# Patient Record
Sex: Male | Born: 1950
Health system: Southern US, Community
[De-identification: ages and names within clinical notes are randomized; demographics above are authoritative.]

## PROBLEM LIST (undated history)

## (undated) DIAGNOSIS — I4891 Unspecified atrial fibrillation: Secondary | ICD-10-CM

## (undated) DIAGNOSIS — D649 Anemia, unspecified: Secondary | ICD-10-CM

## (undated) DIAGNOSIS — I5020 Unspecified systolic (congestive) heart failure: Secondary | ICD-10-CM

## (undated) DIAGNOSIS — J449 Chronic obstructive pulmonary disease, unspecified: Secondary | ICD-10-CM

## (undated) DIAGNOSIS — I214 Non-ST elevation (NSTEMI) myocardial infarction: Secondary | ICD-10-CM

## (undated) DIAGNOSIS — I2510 Atherosclerotic heart disease of native coronary artery without angina pectoris: Secondary | ICD-10-CM

## (undated) DIAGNOSIS — I255 Ischemic cardiomyopathy: Secondary | ICD-10-CM

## (undated) DIAGNOSIS — G4733 Obstructive sleep apnea (adult) (pediatric): Secondary | ICD-10-CM

## (undated) DIAGNOSIS — K219 Gastro-esophageal reflux disease without esophagitis: Secondary | ICD-10-CM

## (undated) HISTORY — DX: Unspecified systolic (congestive) heart failure: I50.20

## (undated) HISTORY — DX: Obstructive sleep apnea (adult) (pediatric): G47.33

---

## 1999-03-19 HISTORY — PX: CORONARY ARTERY BYPASS GRAFT: SHX141

## 1999-10-29 ENCOUNTER — Inpatient Hospital Stay (HOSPITAL_COMMUNITY): Admission: EM | Admit: 1999-10-29 | Discharge: 1999-11-05 | Payer: Self-pay | Admitting: Cardiothoracic Surgery

## 1999-10-30 ENCOUNTER — Encounter: Payer: Self-pay | Admitting: Cardiothoracic Surgery

## 1999-11-01 ENCOUNTER — Encounter: Payer: Self-pay | Admitting: Cardiothoracic Surgery

## 1999-11-02 ENCOUNTER — Encounter: Payer: Self-pay | Admitting: Cardiothoracic Surgery

## 1999-11-03 ENCOUNTER — Encounter: Payer: Self-pay | Admitting: Cardiothoracic Surgery

## 1999-11-04 ENCOUNTER — Encounter: Payer: Self-pay | Admitting: Cardiothoracic Surgery

## 2003-10-05 ENCOUNTER — Other Ambulatory Visit: Payer: Self-pay

## 2005-01-19 ENCOUNTER — Emergency Department (HOSPITAL_COMMUNITY): Admission: EM | Admit: 2005-01-19 | Discharge: 2005-01-19 | Payer: Self-pay | Admitting: Emergency Medicine

## 2009-03-18 HISTORY — PX: CARDIAC CATHETERIZATION: SHX172

## 2010-02-25 ENCOUNTER — Inpatient Hospital Stay: Payer: Self-pay | Admitting: Internal Medicine

## 2010-07-09 ENCOUNTER — Emergency Department: Payer: Self-pay | Admitting: Emergency Medicine

## 2010-07-31 ENCOUNTER — Emergency Department: Payer: Self-pay | Admitting: Emergency Medicine

## 2010-08-01 ENCOUNTER — Emergency Department: Payer: Self-pay | Admitting: Emergency Medicine

## 2011-12-17 HISTORY — PX: CARDIAC CATHETERIZATION: SHX172

## 2012-01-10 ENCOUNTER — Encounter (HOSPITAL_COMMUNITY)
Admission: EM | Disposition: A | Discharge status: Home / Self Care | Payer: Self-pay | Source: Home / Self Care | Attending: Internal Medicine

## 2012-01-10 ENCOUNTER — Emergency Department (HOSPITAL_COMMUNITY): Payer: PRIVATE HEALTH INSURANCE

## 2012-01-10 ENCOUNTER — Inpatient Hospital Stay (HOSPITAL_COMMUNITY)
Admission: EM | Admit: 2012-01-10 | Discharge: 2012-01-15 | DRG: 280 | Disposition: A | Discharge status: Home / Self Care | Payer: PRIVATE HEALTH INSURANCE | Attending: Internal Medicine | Admitting: Internal Medicine

## 2012-01-10 ENCOUNTER — Encounter (HOSPITAL_COMMUNITY): Payer: Self-pay | Admitting: Internal Medicine

## 2012-01-10 ENCOUNTER — Inpatient Hospital Stay (HOSPITAL_COMMUNITY): Payer: PRIVATE HEALTH INSURANCE

## 2012-01-10 DIAGNOSIS — R7402 Elevation of levels of lactic acid dehydrogenase (LDH): Secondary | ICD-10-CM | POA: Diagnosis present

## 2012-01-10 DIAGNOSIS — E876 Hypokalemia: Secondary | ICD-10-CM | POA: Diagnosis present

## 2012-01-10 DIAGNOSIS — I25119 Atherosclerotic heart disease of native coronary artery with unspecified angina pectoris: Secondary | ICD-10-CM

## 2012-01-10 DIAGNOSIS — I255 Ischemic cardiomyopathy: Secondary | ICD-10-CM

## 2012-01-10 DIAGNOSIS — J449 Chronic obstructive pulmonary disease, unspecified: Secondary | ICD-10-CM

## 2012-01-10 DIAGNOSIS — I2510 Atherosclerotic heart disease of native coronary artery without angina pectoris: Secondary | ICD-10-CM | POA: Diagnosis present

## 2012-01-10 DIAGNOSIS — I4891 Unspecified atrial fibrillation: Secondary | ICD-10-CM

## 2012-01-10 DIAGNOSIS — I200 Unstable angina: Secondary | ICD-10-CM

## 2012-01-10 DIAGNOSIS — F17200 Nicotine dependence, unspecified, uncomplicated: Secondary | ICD-10-CM | POA: Diagnosis present

## 2012-01-10 DIAGNOSIS — R570 Cardiogenic shock: Secondary | ICD-10-CM

## 2012-01-10 DIAGNOSIS — I214 Non-ST elevation (NSTEMI) myocardial infarction: Secondary | ICD-10-CM

## 2012-01-10 DIAGNOSIS — R7401 Elevation of levels of liver transaminase levels: Secondary | ICD-10-CM | POA: Diagnosis present

## 2012-01-10 DIAGNOSIS — Z91199 Patient's noncompliance with other medical treatment and regimen due to unspecified reason: Secondary | ICD-10-CM

## 2012-01-10 DIAGNOSIS — I2589 Other forms of chronic ischemic heart disease: Secondary | ICD-10-CM | POA: Diagnosis present

## 2012-01-10 DIAGNOSIS — I498 Other specified cardiac arrhythmias: Secondary | ICD-10-CM | POA: Diagnosis not present

## 2012-01-10 DIAGNOSIS — J4489 Other specified chronic obstructive pulmonary disease: Secondary | ICD-10-CM | POA: Diagnosis present

## 2012-01-10 DIAGNOSIS — I4510 Unspecified right bundle-branch block: Secondary | ICD-10-CM | POA: Diagnosis present

## 2012-01-10 DIAGNOSIS — R630 Anorexia: Secondary | ICD-10-CM | POA: Diagnosis not present

## 2012-01-10 DIAGNOSIS — Z7982 Long term (current) use of aspirin: Secondary | ICD-10-CM

## 2012-01-10 DIAGNOSIS — Z9119 Patient's noncompliance with other medical treatment and regimen: Secondary | ICD-10-CM

## 2012-01-10 DIAGNOSIS — I249 Acute ischemic heart disease, unspecified: Secondary | ICD-10-CM

## 2012-01-10 DIAGNOSIS — K219 Gastro-esophageal reflux disease without esophagitis: Secondary | ICD-10-CM | POA: Diagnosis present

## 2012-01-10 DIAGNOSIS — I4901 Ventricular fibrillation: Principal | ICD-10-CM

## 2012-01-10 DIAGNOSIS — R739 Hyperglycemia, unspecified: Secondary | ICD-10-CM

## 2012-01-10 DIAGNOSIS — J9600 Acute respiratory failure, unspecified whether with hypoxia or hypercapnia: Secondary | ICD-10-CM

## 2012-01-10 DIAGNOSIS — I25810 Atherosclerosis of coronary artery bypass graft(s) without angina pectoris: Secondary | ICD-10-CM | POA: Diagnosis present

## 2012-01-10 DIAGNOSIS — R112 Nausea with vomiting, unspecified: Secondary | ICD-10-CM | POA: Diagnosis not present

## 2012-01-10 DIAGNOSIS — I469 Cardiac arrest, cause unspecified: Secondary | ICD-10-CM

## 2012-01-10 DIAGNOSIS — G9340 Encephalopathy, unspecified: Secondary | ICD-10-CM | POA: Diagnosis not present

## 2012-01-10 DIAGNOSIS — Z720 Tobacco use: Secondary | ICD-10-CM

## 2012-01-10 HISTORY — DX: Ischemic cardiomyopathy: I25.5

## 2012-01-10 HISTORY — DX: Atherosclerotic heart disease of native coronary artery without angina pectoris: I25.10

## 2012-01-10 HISTORY — DX: Non-ST elevation (NSTEMI) myocardial infarction: I21.4

## 2012-01-10 HISTORY — DX: Gastro-esophageal reflux disease without esophagitis: K21.9

## 2012-01-10 HISTORY — DX: Chronic obstructive pulmonary disease, unspecified: J44.9

## 2012-01-10 HISTORY — DX: Unspecified atrial fibrillation: I48.91

## 2012-01-10 HISTORY — DX: Anemia, unspecified: D64.9

## 2012-01-10 HISTORY — PX: LEFT HEART CATHETERIZATION WITH CORONARY ANGIOGRAM: SHX5451

## 2012-01-10 LAB — COMPREHENSIVE METABOLIC PANEL
Alkaline Phosphatase: 93 U/L (ref 39–117)
BUN: 12 mg/dL (ref 6–23)
CO2: 11 mEq/L — ABNORMAL LOW (ref 19–32)
Chloride: 102 mEq/L (ref 96–112)
GFR calc Af Amer: 78 mL/min — ABNORMAL LOW (ref >90)
Glucose, Bld: 187 mg/dL — ABNORMAL HIGH (ref 70–99)
Potassium: 2.8 mEq/L — ABNORMAL LOW (ref 3.5–5.1)
Total Bilirubin: 0.5 mg/dL (ref 0.3–1.2)

## 2012-01-10 LAB — POCT I-STAT 3, ART BLOOD GAS (G3+)
Acid-base deficit: 20 mmol/L — ABNORMAL HIGH (ref 0.0–2.0)
Acid-base deficit: 1 mmol/L (ref 0.0–2.0)
Acid-base deficit: 1 mmol/L (ref 0.0–2.0)
Bicarbonate: 10.3 mEq/L — ABNORMAL LOW (ref 20.0–24.0)
Bicarbonate: 18.4 mEq/L — ABNORMAL LOW (ref 20.0–24.0)
Bicarbonate: 23.5 mEq/L (ref 20.0–24.0)
O2 Saturation: 76 %
O2 Saturation: 100 %
O2 Saturation: 100 %
O2 Saturation: 100 %
Patient temperature: 98.6
TCO2: 20 mmol/L (ref 0–100)
TCO2: 26 mmol/L (ref 0–100)
TCO2: 25 mmol/L (ref 0–100)
pCO2 arterial: 42.3 mmHg (ref 35.0–45.0)
pCO2 arterial: 44.4 mmHg (ref 35.0–45.0)
pCO2 arterial: 37.4 mmHg (ref 35.0–45.0)
pH, Arterial: 7.246 — ABNORMAL LOW (ref 7.350–7.450)
pO2, Arterial: 216 mmHg — ABNORMAL HIGH (ref 80.0–100.0)
pO2, Arterial: 199 mmHg — ABNORMAL HIGH (ref 80.0–100.0)

## 2012-01-10 LAB — BASIC METABOLIC PANEL
BUN: 12 mg/dL (ref 6–23)
CO2: 24 mEq/L (ref 19–32)
Calcium: 7.5 mg/dL — ABNORMAL LOW (ref 8.4–10.5)
Creatinine, Ser: 1.04 mg/dL (ref 0.50–1.35)
Glucose, Bld: 329 mg/dL — ABNORMAL HIGH (ref 70–99)

## 2012-01-10 LAB — POCT I-STAT 3, VENOUS BLOOD GAS (G3P V)
Acid-base deficit: 2 mmol/L (ref 0.0–2.0)
O2 Saturation: 69 %
TCO2: 26 mmol/L (ref 0–100)

## 2012-01-10 LAB — CBC
HCT: 46.9 % (ref 39.0–52.0)
Hemoglobin: 15.8 g/dL (ref 13.0–17.0)
RBC: 4.89 MIL/uL (ref 4.22–5.81)
WBC: 12.2 10*3/uL — ABNORMAL HIGH (ref 4.0–10.5)

## 2012-01-10 LAB — POCT I-STAT, CHEM 8
Calcium, Ion: 1.13 mmol/L (ref 1.13–1.30)
Creatinine, Ser: 1 mg/dL (ref 0.50–1.35)
Glucose, Bld: 216 mg/dL — ABNORMAL HIGH (ref 70–99)
HCT: 48 % (ref 39.0–52.0)
Hemoglobin: 16.3 g/dL (ref 13.0–17.0)

## 2012-01-10 LAB — PROTIME-INR: Prothrombin Time: 15.9 seconds — ABNORMAL HIGH (ref 11.6–15.2)

## 2012-01-10 LAB — POCT I-STAT TROPONIN I

## 2012-01-10 LAB — APTT: aPTT: 31 seconds (ref 24–37)

## 2012-01-10 SURGERY — LEFT HEART CATHETERIZATION WITH CORONARY ANGIOGRAM
Anesthesia: LOCAL

## 2012-01-10 MED ORDER — SODIUM CHLORIDE 0.9 % IV SOLN
25.0000 ug/h | INTRAVENOUS | Status: DC
  Filled 2012-01-10: qty 50

## 2012-01-10 MED ORDER — SODIUM CHLORIDE 0.9 % IV SOLN: INTRAVENOUS | Status: DC

## 2012-01-10 MED ORDER — FAMOTIDINE IN NACL 20-0.9 MG/50ML-% IV SOLN
20.0000 mg | Freq: Two times a day (BID) | INTRAVENOUS | Status: DC
  Administered 2012-01-10 – 2012-01-11 (×3): 20 mg via INTRAVENOUS
  Filled 2012-01-10 (×5): qty 50

## 2012-01-10 MED ORDER — MIDAZOLAM HCL 2 MG/2ML IJ SOLN
INTRAMUSCULAR | Status: AC
  Filled 2012-01-10: qty 2

## 2012-01-10 MED ORDER — SUCCINYLCHOLINE CHLORIDE 20 MG/ML IJ SOLN
INTRAMUSCULAR | Status: AC
  Filled 2012-01-10: qty 1

## 2012-01-10 MED ORDER — ROCURONIUM BROMIDE 50 MG/5ML IV SOLN
INTRAVENOUS | Status: AC
  Filled 2012-01-10: qty 2

## 2012-01-10 MED ORDER — ETOMIDATE 2 MG/ML IV SOLN
INTRAVENOUS | Status: AC | PRN
  Administered 2012-01-10: 20 mg via INTRAVENOUS

## 2012-01-10 MED ORDER — SODIUM BICARBONATE 8.4 % IV SOLN
INTRAVENOUS | Status: AC
  Filled 2012-01-10: qty 100

## 2012-01-10 MED ORDER — ASPIRIN 81 MG PO CHEW
324.0000 mg | CHEWABLE_TABLET | ORAL | Status: AC
  Administered 2012-01-10: 324 mg via ORAL

## 2012-01-10 MED ORDER — EPINEPHRINE HCL 0.1 MG/ML IJ SOLN
INTRAMUSCULAR | Status: AC | PRN
  Administered 2012-01-10: 1 via INTRAVENOUS

## 2012-01-10 MED ORDER — POTASSIUM CHLORIDE 10 MEQ/100ML IV SOLN
INTRAVENOUS | Status: AC
  Filled 2012-01-10: qty 100

## 2012-01-10 MED ORDER — LIDOCAINE HCL (PF) 1 % IJ SOLN
INTRAMUSCULAR | Status: AC
  Filled 2012-01-10: qty 30

## 2012-01-10 MED ORDER — HEPARIN BOLUS VIA INFUSION
4000.0000 [IU] | Freq: Once | INTRAVENOUS | Status: AC
  Administered 2012-01-10: 4000 [IU] via INTRAVENOUS
  Filled 2012-01-10: qty 4000

## 2012-01-10 MED ORDER — FENTANYL CITRATE 0.05 MG/ML IJ SOLN
INTRAMUSCULAR | Status: AC
  Filled 2012-01-10: qty 2

## 2012-01-10 MED ORDER — SODIUM CHLORIDE 0.9 % IV SOLN: 250.0000 mL | INTRAVENOUS | Status: DC | PRN

## 2012-01-10 MED ORDER — ASPIRIN 81 MG PO CHEW
324.0000 mg | CHEWABLE_TABLET | Freq: Once | ORAL | Status: DC
  Filled 2012-01-10: qty 4

## 2012-01-10 MED ORDER — ETOMIDATE 2 MG/ML IV SOLN
INTRAVENOUS | Status: AC
  Filled 2012-01-10: qty 20

## 2012-01-10 MED ORDER — LORAZEPAM 2 MG/ML IJ SOLN
INTRAMUSCULAR | Status: AC
  Administered 2012-01-10: 1 mg
  Filled 2012-01-10: qty 1

## 2012-01-10 MED ORDER — IPRATROPIUM BROMIDE HFA 17 MCG/ACT IN AERS: 6.0000 | INHALATION_SPRAY | RESPIRATORY_TRACT | Status: DC | PRN

## 2012-01-10 MED ORDER — HEPARIN (PORCINE) IN NACL 100-0.45 UNIT/ML-% IJ SOLN
850.0000 [IU]/h | INTRAMUSCULAR | Status: DC
  Administered 2012-01-10: 850 [IU]/h via INTRAVENOUS
  Filled 2012-01-10 (×2): qty 250

## 2012-01-10 MED ORDER — MIDAZOLAM HCL 2 MG/2ML IJ SOLN: 2.0000 mg | INTRAMUSCULAR | Status: DC | PRN

## 2012-01-10 MED ORDER — HEPARIN (PORCINE) IN NACL 100-0.45 UNIT/ML-% IJ SOLN
850.0000 [IU]/h | INTRAMUSCULAR | Status: DC
  Administered 2012-01-11: 850 [IU]/h via INTRAVENOUS
  Filled 2012-01-10: qty 250

## 2012-01-10 MED ORDER — FENTANYL BOLUS VIA INFUSION
25.0000 ug | Freq: Four times a day (QID) | INTRAVENOUS | Status: DC | PRN
  Filled 2012-01-10: qty 100

## 2012-01-10 MED ORDER — POTASSIUM CHLORIDE 10 MEQ/100ML IV SOLN
INTRAVENOUS | Status: AC
  Administered 2012-01-10: 10 meq
  Filled 2012-01-10: qty 100

## 2012-01-10 MED ORDER — HEPARIN (PORCINE) IN NACL 2-0.9 UNIT/ML-% IJ SOLN
INTRAMUSCULAR | Status: AC
  Filled 2012-01-10: qty 1500

## 2012-01-10 MED ORDER — ROCURONIUM BROMIDE 50 MG/5ML IV SOLN
INTRAVENOUS | Status: AC | PRN
  Administered 2012-01-10: 70 mg via INTRAVENOUS

## 2012-01-10 MED ORDER — LIDOCAINE HCL (CARDIAC) 20 MG/ML IV SOLN
INTRAVENOUS | Status: AC
Start: 2012-01-10 — End: 2012-01-11
  Filled 2012-01-10: qty 5

## 2012-01-10 MED ORDER — HEPARIN SODIUM (PORCINE) 5000 UNIT/ML IJ SOLN: 60.0000 [IU]/kg | INTRAMUSCULAR | Status: DC

## 2012-01-10 MED ORDER — ASPIRIN 300 MG RE SUPP
300.0000 mg | RECTAL | Status: AC
  Filled 2012-01-10: qty 1

## 2012-01-10 MED ORDER — NOREPINEPHRINE BITARTRATE 1 MG/ML IJ SOLN: 2.0000 ug/min | Freq: Once | INTRAMUSCULAR | Status: DC

## 2012-01-10 MED ORDER — FUROSEMIDE 10 MG/ML IJ SOLN
INTRAMUSCULAR | Status: AC
  Administered 2012-01-11: 40 mg via INTRAVENOUS
  Filled 2012-01-10: qty 8

## 2012-01-10 MED ORDER — NITROGLYCERIN 0.2 MG/ML ON CALL CATH LAB
INTRAVENOUS | Status: AC
  Filled 2012-01-10: qty 1

## 2012-01-10 MED ORDER — DEXTROSE 5 % IV SOLN
300.0000 mg | INTRAVENOUS | Status: AC | PRN
  Administered 2012-01-10: 300 mg via INTRAVENOUS

## 2012-01-10 MED ORDER — SODIUM BICARBONATE 8.4 % IV SOLN
INTRAVENOUS | Status: AC | PRN
  Administered 2012-01-10: 100 meq via INTRAVENOUS

## 2012-01-10 MED ORDER — DEXTROSE 5 % IV SOLN
2.0000 ug/min | INTRAVENOUS | Status: DC
  Filled 2012-01-10: qty 4

## 2012-01-10 MED ORDER — INSULIN ASPART 100 UNIT/ML ~~LOC~~ SOLN
0.0000 [IU] | SUBCUTANEOUS | Status: DC
  Administered 2012-01-10: 7 [IU] via SUBCUTANEOUS
  Administered 2012-01-11 (×3): 1 [IU] via SUBCUTANEOUS

## 2012-01-10 MED ORDER — MIDAZOLAM BOLUS VIA INFUSION
1.0000 mg | INTRAVENOUS | Status: DC | PRN
  Filled 2012-01-10: qty 2

## 2012-01-10 MED ORDER — SODIUM CHLORIDE 0.9 % IV SOLN
1.0000 mg/h | INTRAVENOUS | Status: DC
  Administered 2012-01-10: 2 mg/h via INTRAVENOUS
  Administered 2012-01-11: 4 mg/h via INTRAVENOUS
  Filled 2012-01-10 (×3): qty 10

## 2012-01-10 NOTE — ED Notes (Signed)
Wife and daughter at bedside. Informed of plan of care. Prepare pt to go to cath lab.

## 2012-01-10 NOTE — ED Provider Notes (Signed)
History     CSN: [id number]  Arrival date & time 01/10/12  1713   First MD Initiated Contact with Patient 01/10/12 1715      Chief Complaint  Patient presents with  . Chest Pain    (Consider location/radiation/quality/duration/timing/severity/associated sxs/prior treatment) HPI Chest pain that feels similar to previous MIs. Brought here by EMS. Given ASA, nitrox4, IV morphine.  Pain described as heaviness, 3/10 in intensity. The location of the patient's problem is central chest with radiation to left arm.  Onset was at 2 PM with slightly improved course since that time.   Modifying factors:  Worse with exertion.  Associated symptoms: diaphoresis, nausea but no vomiting.   Past Medical History  Diagnosis Date  . CAD (coronary artery disease)     s/p CABG 2001  . COPD (chronic obstructive pulmonary disease)     ongoing tobacco use  . GERD (gastroesophageal reflux disease)   . Anemia   . Noncompliance with medication regimen     No past surgical history on file. CABG 2001 No family history on file.  History  Substance Use Topics  . Smoking status: Current Every Day Smoker -- 1.0 packs/day for 45 years    Types: Cigarettes  . Smokeless tobacco: Not on file  . Alcohol Use: Not on file      Review of Systems Negative for respiratory distress, cough. Negative for vomiting, diarrhea.  All other systems reviewed and negative unless noted in HPI.    Allergies  Codeine  Home Medications   Current Outpatient Rx  Name Route Sig Dispense Refill  . ASPIRIN 325 MG PO TABS Oral Take 325 mg by mouth daily.      BP 86/61  Pulse 83  Resp 15  Ht 6' (1.829 m)  SpO2 100%  Physical Exam Nursing note and vitals reviewed.  Constitutional: Pt is alert and appears stated age. Oropharynx: Airway open without erythema or exudate. Respiratory: No respiratory distress. Equal breathing bilaterally. CV: Extremities warm and well perfused. Neuro: No motor nor sensory  deficit. Head: Normocephalic and atraumatic. Eyes: No conjunctivitis, no scleral icterus. Neck: Supple, no mass. Chest: Non-tender. Abdomen: Soft, non-tender MSK: Extremities are atraumatic without deformity. Skin: No rash, no wounds.  ED Course  INTUBATION Date/Time: 01/10/2012 6:16 PM Performed by: Charm Barges Authorized by: Charm Barges Consent: Verbal consent obtained. The procedure was performed in an emergent situation. Consent given by: spouse Required items: required blood products, implants, devices, and special equipment available Patient identity confirmed: arm band and provided demographic data Time out: Immediately prior to procedure a "time out" was called to verify the correct patient, procedure, equipment, support staff and site/side marked as required. Indications: airway protection Intubation method: video-assisted Patient status: paralyzed (RSI) Preoxygenation: nonrebreather mask Pretreatment medications: lidocaine Sedatives: etomidate Paralytic: rocuronium Laryngoscope size: Mac 3 Tube size: 7.5 mm Tube type: cuffed Number of attempts: 2 Ventilation between attempts: BVM Cricoid pressure: yes Cords visualized: yes Post-procedure assessment: chest rise,  ETCO2 monitor and CO2 detector Breath sounds: equal Cuff inflated: yes ETT to lip: 22 cm Tube secured with: ETT holder Chest x-ray interpreted by me. Chest x-ray findings: endotracheal tube too high Tube repositioned: tube repositioned successfully Patient tolerance: Patient tolerated the procedure well with no immediate complications.   (including critical care time)  Labs Reviewed  CBC - Abnormal; Notable for the following:    WBC 12.2 (*)     All other components within normal limits  COMPREHENSIVE METABOLIC PANEL - Abnormal; Notable for the  following:    Potassium 2.8 (*)     CO2 11 (*)     Glucose, Bld 187 (*)     AST 121 (*)     ALT 138 (*)     GFR calc non Af Amer 67 (*)     GFR  calc Af Amer 78 (*)     All other components within normal limits  PROTIME-INR - Abnormal; Notable for the following:    Prothrombin Time 15.9 (*)     All other components within normal limits  POCT I-STAT, CHEM 8 - Abnormal; Notable for the following:    Potassium 2.9 (*)     Glucose, Bld 216 (*)     All other components within normal limits  POCT I-STAT 3, BLOOD GAS (G3+) - Abnormal; Notable for the following:    pH, Arterial 7.019 (*)     pO2, Arterial 59.0 (*)     Bicarbonate 10.3 (*)     Acid-base deficit 20.0 (*)     All other components within normal limits  POCT I-STAT TROPONIN I - Abnormal; Notable for the following:    Troponin i, poc 0.16 (*)     All other components within normal limits  D-DIMER, QUANTITATIVE - Abnormal; Notable for the following:    D-Dimer, Quant 3.82 (*)     All other components within normal limits  POCT I-STAT 3, BLOOD GAS (G3+) - Abnormal; Notable for the following:    pH, Arterial 7.246 (*)     pO2, Arterial 328.0 (*)     Bicarbonate 18.4 (*)     Acid-base deficit 9.0 (*)     All other components within normal limits  POCT I-STAT 3, BLOOD GAS (G3+) - Abnormal; Notable for the following:    pO2, Arterial 216.0 (*)     Bicarbonate 24.7 (*)     All other components within normal limits  POCT I-STAT 3, BLOOD GAS (G3P V) - Abnormal; Notable for the following:    pH, Ven 7.314 (*)     Bicarbonate 24.6 (*)     All other components within normal limits  APTT  POCT ACTIVATED CLOTTING TIME  BASIC METABOLIC PANEL  MAGNESIUM  PHOSPHORUS  TROPONIN I  TROPONIN I  TROPONIN I  PRO B NATRIURETIC PEPTIDE  PROTIME-INR  BASIC METABOLIC PANEL  LACTIC ACID, PLASMA  BLOOD GAS, ARTERIAL  BLOOD GAS, ARTERIAL  HEPATITIS PANEL, ACUTE  HEPATIC FUNCTION PANEL  MRSA PCR SCREENING  BLOOD GAS, ARTERIAL   Dg Chest Port 1 View  01/10/2012  *RADIOLOGY REPORT*  Clinical Data: ET tube, infiltrates  PORTABLE CHEST - 1 VIEW  Comparison: 01/10/2012  Findings:  Endotracheal tube terminates 3.5 cm above the carina.  Increased interstitial markings, favored to reflect mild interstitial edema.  No pleural effusion or pneumothorax.  The heart is normal in size. Postsurgical changes related to prior CABG.  Enteric tube looped within the gastric cardia.  Catheter with its tip in the right main pulmonary artery, likely a Swan-Ganz catheter, although via femoral approach.  IMPRESSION: Endotracheal tube terminates 3.5 cm above the carina.  Suspected mild interstitial edema.   Original Report Authenticated By: Charline Bills, M.D.    Dg Chest Port 1 View  01/10/2012  *RADIOLOGY REPORT*  Clinical Data: Possible infarct myocardial  PORTABLE CHEST - 1 VIEW  Comparison: 01/10/2012  Findings: Endotracheal tube has been placed with tip 8.5 cm above the carina.  Vascular pattern normal.  Lungs clear.  IMPRESSION: No acute cardiopulmonary abnormalities.  Original Report Authenticated By: Otilio Carpen, M.D.    Dg Chest Port 1 View  01/10/2012  *RADIOLOGY REPORT*  Clinical Data: Chest pain and respiratory arrest  PORTABLE CHEST - 1 VIEW  Comparison: None  Findings: Previous median sternotomy and CABG procedure. Heart size is normal.  No pleural effusion or edema.  No airspace consolidation.  IMPRESSION:  1.  No acute cardiopulmonary abnormalities.   Original Report Authenticated By: Rosealee Albee, M.D.      1. Ventricular fibrillation   2. Cardiac arrest   3. ACS (acute coronary syndrome)   4. Acute respiratory failure   5. CAD (coronary artery disease)   6. Cardiogenic shock   7. COPD (chronic obstructive pulmonary disease)   8. NSTEMI (non-ST elevated myocardial infarction)   9. Tobacco abuse       MDM  61 y.o. male here with chest pain.  Story concerning for ACS. Immediately after my interview called back to room. Pt unresponsive. Initiated CPR. Called Dr. Lynelle Doctor to bedside. V fib arrest. ROSC after defibrillation, epi. Please see his note for critical  care note. Wife and family member at bedside updated. Counseling provided regarding diagnosis, treatment plan. Questions answered.  Discussed with cardiology pt with closed eyes, incomprehensible speech, will follow commands. Intubated as above. Cards and critical care at bedside.   Medications/interventions:  Oxygen, chest compressions, defibrillation, IV epinephrine, IV fluid, IV ativan. IV etomidate, IV roc. IV   Data reviewed: EKG ordered and interpreted by me: normal rate, sinus rhyth.  Lab tests ordered and reviewed by me: iSTAT abd with pH of 7.02. I independently viewed the following imaging studies and reviewed radiology's interpretation as summarized: CXR with no mediastinal widening.  Course of care: After intubation, pt low blood pressures. MAP around 60. Cards with recs for levophed, dopamine. Heparin ordered. Pt ready to move to cath lab.   Medical Decision Making discussed with ED attending Celene Kras, MD          Charm Barges, MD 01/10/12 2200

## 2012-01-10 NOTE — CV Procedure (Signed)
Urgent Cardiac Catherization  Franklin Marks, 61 y.o., male  Full note dictated; see diagram in chart  DICTATION # [phone], [id number]  Ao:145/94 LV: 145/23/32  LM: nl LAD: occluded proximally Lcx: 30% proximal RCA: occluded ostially post conus  LIMA to LAD: patent, 80% stenosis in LAD after anastomosis SVG to Dx2: old occlusion with thrombus (was apparently present in 02/2010 cath at [facility]) SVG to DX: patent SVG to RCA: patent but with diffuse distal native RCA beyond insertion with 80-90% post PDA and in PLA  Swan: RA mean 15, a 19            RV: 56/14            PA: 56/34            PC: 33   [name], MD, [facility] 01/10/2012 8:18 PM

## 2012-01-10 NOTE — H&P (Signed)
Name: Mohammedali Bedoy MRN: [id number] DOB: [birth year]    LOS: 0  Referring Provider:  Dr Gala Romney Reason for Referral:  VF arrest, VDRF  PULMONARY / CRITICAL CARE MEDICINE  HPI:  Mr. Calabretta is a 61 y/o male with a h/o COPD with ongoing tobacco use (not on BD's), anemia, GERD and CAD s/p CABG '01. Being admitted with unstable angina and subsequent VF arrest in the [facility] ED. ECG without ST elevation, does have R BBB. Underwent CPR x 1 minute, received epi x 1, amiodarone, heparin gtt. Intubated in the ED with some transient shock that resolved with resolution of sedation. Now in the cath lab for urgent coronary angiography. PCCM to admit.    Past Medical History  Diagnosis Date  . CAD (coronary artery disease)     s/p CABG 2001  . COPD (chronic obstructive pulmonary disease)     ongoing tobacco use  . GERD (gastroesophageal reflux disease)   . Anemia   . Noncompliance with medication regimen    No past surgical history on file. Prior to Admission medications   Medication Sig Start Date End Date Taking? Authorizing Provider  aspirin 325 MG tablet Take 325 mg by mouth daily.   Yes Historical Provider, MD   Allergies Allergies  Allergen Reactions  . Codeine Nausea And Vomiting    Family History No family history on file. Social History  reports that he has been smoking Cigarettes.  He has a 45 pack-year smoking history. He does not have any smokeless tobacco history on file. His alcohol and drug histories not on file.  Review Of Systems:  Unable to obtain due to MS  Brief patient description:  61 yo man with COPD, CAD/CABG, s/p VF arrest and successful resuscitation.   Events Since Admission: Cath lab results 10/25 >>   Current Status: Critical  Vital Signs: Pulse Rate:  [83-100] 83  (10/25 1813) Resp:  [12-31] 15  (10/25 1813) BP: (75-86)/(48-61) 86/61 mmHg (10/25 1812) SpO2:  [98 %-100 %] 100 % (10/25 1813) FiO2 (%):  [100 %] 100 % (10/25 1800) No intake or output data  in the 24 hours ending 01/10/12 1858  Physical Examination: General:  Sedated and intubated, clammy and cool Neuro:  Unresponsive, breathes over set MV rate HEENT:  ETT in place, pupils 3mm, non-reactive Neck:  No LAD Cardiovascular:  Regular, no M, no edema Lungs:  Clear B, no wheeze Abdomen:  Soft, NT, no BS heard Musculoskeletal:  No deformitied Skin:  Cool extremities, no rashes  Principal Problem:  *Ventricular fibrillation Active Problems:  NSTEMI (non-ST elevated myocardial infarction)  Cardiac arrest  Cardiogenic shock  CAD (coronary artery disease)  Acute respiratory failure  COPD (chronic obstructive pulmonary disease)  Tobacco abuse   ASSESSMENT AND PLAN  PULMONARY  Lab 01/10/12 1757  PHART 7.019*  PCO2ART 40.0  PO2ART 59.0*  HCO3 10.3*  O2SAT 76.0   Ventilator Settings: Vent Mode:  [-] PRVC FiO2 (%):  [100 %] 100 % Set Rate:  [16 bmp] 16 bmp Vt Set:  [620 mL] 620 mL PEEP:  [5 cmH20] 5 cmH20 Plateau Pressure:  [13 cmH20] 13 cmH20 CXR:  10/25 >> ETT high, no infiltrates ETT:  10/25 >>   A:  VDRF s/p arrest P:   - No current evidence pulmonary injury or limitation. Pt reportedly had good MS post return of spontaneous circulation, goal initiate SBT and assess for extubation once hemodynamically stablized - ABG on arrival to ICU, adjust MV  - CXR in ICU  to r/o evolving pulm edema   CARDIOVASCULAR No results found for this basename: TROPONINI:5,LATICACIDVEN:5, O2SATVEN:5,PROBNP:5 in the last 168 hours ECG:  A Fib with R BBB Lines:   A: VF arrest. Suspect this is due to unstable angina or a primary cardiac cause. With RBBB could consider other causes arrest such as PE P:  - heparin gtt started for possible ACS, A fib, also as empiric rx for possible PE - amiodarone, ASA, b-blockade and other cardiac regimen as per Dr Bensimhon's plans - norepi ordered in the ED but has not been required - TTE  - if no evidence culprit cardiac lesion, then we may  need to consider further workup for PE. Will defer for now   RENAL  Lab 01/10/12 1756 01/10/12 1734  NA 142 141  K 2.9* 2.8*  CL 110 102  CO2 -- 11*  BUN 12 12  CREATININE 1.00 1.15  CALCIUM -- 8.9  MG -- --  PHOS -- --   Intake/Output    None    Foley:  10/25 >>   A:  hypokalemia P:   - repeat BMP on arrival to 2900 and replete  GASTROINTESTINAL  Lab 01/10/12 1734  AST 121*  ALT 138*  ALKPHOS 93  BILITOT 0.5  PROT 7.3  ALBUMIN 3.7    A:  Transaminitis, etiology unclear P:   - trend LFT - check INR - hepatitis panel  HEMATOLOGIC  Lab 01/10/12 1756 01/10/12 1734  HGB 16.3 15.8  HCT 48.0 46.9  PLT -- 198  INR -- 1.30  APTT -- 31   A:  No issues P:    INFECTIOUS  Lab 01/10/12 1734  WBC 12.2*  PROCALCITON --   Cultures: none Antibiotics: none  A:  No evidence for infection P:     ENDOCRINE No results found for this basename: GLUCAP:5 in the last 168 hours A:  No hx DM P:   - check CBG's, start SSI coverage if > 160's  NEUROLOGIC  A:  Sedated, at risk for encephalopathy s/p VF. MS reportedly was intact immediately post resuscitation, was agitated prior to intubation P:   - decided to defer hypothermia protocol given post-code MS and his short down time.  - fentanyl gtt + intermittent versed  BEST PRACTICE / DISPOSITION Level of Care:  ICU Primary Service:  PCCM Consultants:  [place] cards Code Status:  Full Diet:  NPO DVT Px:  Heparin gtt GI Px:  pepcid Skin Integrity:  Intact  Social / Family:  Discussed with pt's wife at bedside in the ED  CC time 80 minutes  Levy Pupa, MD, PhD 01/10/2012, 7:18 PM [facility] [phone] or if no answer [phone]

## 2012-01-10 NOTE — Progress Notes (Signed)
Agitation  Start Versed drip

## 2012-01-10 NOTE — Progress Notes (Signed)
Chaplain Note:  Chaplain responded immediately to page from [facility] to provide comfort for family of pt.  Pt was in bed being treated by [facility] staff and being prepared for transport to Cath Lab.  Pt was intubated and sedated and did not communicate during this visit.  Chaplain provided spiritual comfort, support, and prayer for pt's wife and family, accompanying them to Cath Lab waiting area and keeping them informed of progress during the procedure.  Chaplain supported pt's physician and family while physician explained the cath procedure results and pt's condition.  Chaplain accompanied family to CICU waiting area and informed CICU staff of family's presense and location.  Pt's family expressed appreciation for chaplain support.  Chaplain will follow up as needed.  01/10/12 1817  Clinical Encounter Type  Visited With Patient;Family  Visit Type Spiritual support  Referral From Nurse  Spiritual Encounters  Spiritual Needs Emotional;Prayer  Stress Factors  Patient Stress Factors Major life changes;Health changes  Family Stress Factors Major life changes   Verdie Shire, [place]  [phone]

## 2012-01-10 NOTE — Consult Note (Signed)
HPI:  [name] is a 61 y/o male with a h/o COPD with ongoing tobacco use, anemia, GERD and CAD. Being admitted with unstable angina and VF arrest.   Had MI in 1999 had PCI of LCX. In 2001 had NSTEMI and underwent CABG by Dr. Donata Marks with LIMA-LAD, SVG-D1, SVG-D2 and SVG-PDA-PL.   Apparently had CP this summer and had cath at [facility] by [facility] clinic doc. Told heart muscle was fine and arteries OK. They couldn't f/u due to insurance.   Has been noncompliant with meds. Smoking 1ppd. This afternoon ~2pm developed CP. Lasted several hours. Called EMS. Got ASA in truck (325mg ). Came to ER and developed VF arrest. CPR x 1 minute. Given epi and shocked. Post shock was combative and got 1mg  Ativan. Now somnolent. Responds to pain. Denies CP. ECG with AF and RBBB. No ST elevation or depression. Got 1 dose IV amio 300 mg and 2 amps bicarb.   ABG 7.01/40/59/76%  ? Venous. Not being intubated. POC Trop 0.16  ROS taken from wife as patient unable to provide.  Review of Systems:     Cardiac Review of Systems: {Y] = yes [ ]  = no  Chest Pain [  y  ]  Resting SOB [   ] Exertional SOB  [ y ]  Orthopnea [  ]   Pedal Edema [   ]    Palpitations [  ] Syncope  [  ]   Presyncope [   ]  General Review of Systems: [Y] = yes [  ]=no Constitional: recent weight change [  ]; anorexia [  ]; fatigue [  ]; nausea [  ]; night sweats [  ]; fever [  ]; or chills [  ];                                                                                                                                          Dental: poor dentition[  ];  Eye : blurred vision [  ]; diplopia [   ]; vision changes [  ];  Amaurosis fugax[  ]; Resp: cough [  ];  wheezing[  ];  hemoptysis[  ]; shortness of breath[  ]; paroxysmal nocturnal dyspnea[  ]; dyspnea on exertion[ y ]; or orthopnea[  ];  GI:  gallstones[  ], vomiting[  ];  dysphagia[  ]; melena[  ];  hematochezia [  ]; heartburn[  ];   Hx of   GU: kidney stones [  ]; hematuria[  ];    dysuria [  ];  nocturia[  ];  history of     obstruction [  ];                 Skin: rash, swelling[  ];, hair loss[  ];  peripheral edema[  ];  or itching[  ]; Musculosketetal: myalgias[  ];  joint swelling[  ];  joint erythema[  ];  joint pain[  ];  back pain[  ];  Heme/Lymph: bruising[  ];  bleeding[  ];  anemia[  ];  Neuro: TIA[  ];  headaches[  ];  stroke[  ];  vertigo[  ];  seizures[  ];   paresthesias[  ];  difficulty walking[  ];  Psych:depression[  ]; anxiety[  ];  Endocrine: diabetes[  ];  thyroid dysfunction[  ];  Other:  Allergies: NKDA  History   Social History  . Marital Status: Married    Spouse Name: N/A    Number of Children: N/A  . Years of Education: N/A   Occupational History  . Not on file.   Social History Main Topics  . Smoking status: Not on file  . Smokeless tobacco: Not on file  . Alcohol Use: Not on file  . Drug Use: Not on file  . Sexually Active: Not on file   Other Topics Concern  . Not on file   Social History Narrative  . No narrative on file   FAMILY HX: unavailable  PHYSICAL EXAM: 110/70 85   General:  Ill appearing. Somnolent. Tachypneic. Responds purposefully to pain.  HEENT: normal Neck: supple. JVP 7. Carotids 2+ bilat; no bruits. No lymphadenopathy or thryomegaly appreciated. Cor: PMI nonpalpable. Distant. Irregular. No obvious murmurs Lungs: +rhonchi. No wheeze Abdomen: soft, nontender, nondistended. No hepatosplenomegaly. No bruits or masses. Good bowel sounds. Extremities: clammy no cyanosis, clubbing, rash, edema Neuro: alert & oriented x 3, cranial nerves grossly intact. moves all 4 extremities w/o difficulty. Affect pleasant.  ECG: AF 80s with RBBB and LPFB. No ST-T wave abnormalities.    Results for orders placed during the hospital encounter of 01/10/12 (from the past 24 hour(s))  POCT I-STAT TROPONIN I     Status: Abnormal   Collection Time   01/10/12  5:55 PM      Component Value Range   Troponin i, poc 0.16 (*)  0.00 - 0.08 ng/mL   Comment NOTIFIED PHYSICIAN     Comment 3           POCT I-STAT, CHEM 8     Status: Abnormal   Collection Time   01/10/12  5:56 PM      Component Value Range   Sodium 142  135 - 145 mEq/L   Potassium 2.9 (*) 3.5 - 5.1 mEq/L   Chloride 110  96 - 112 mEq/L   BUN 12  6 - 23 mg/dL   Creatinine, Ser 1.61  0.50 - 1.35 mg/dL   Glucose, Bld 096 (*) 70 - 99 mg/dL   Calcium, Ion 0.45  4.09 - 1.30 mmol/L   TCO2 10  0 - 100 mmol/L   Hemoglobin 16.3  13.0 - 17.0 g/dL   HCT 81.1  91.4 - 78.2 %  POCT I-STAT 3, BLOOD GAS (G3+)     Status: Abnormal   Collection Time   01/10/12  5:57 PM      Component Value Range   pH, Arterial 7.019 (*) 7.350 - 7.450   pCO2 arterial 40.0  35.0 - 45.0 mmHg   pO2, Arterial 59.0 (*) 80.0 - 100.0 mmHg   Bicarbonate 10.3 (*) 20.0 - 24.0 mEq/L   TCO2 12  0 - 100 mmol/L   O2 Saturation 76.0     Acid-base deficit 20.0 (*) 0.0 - 2.0 mmol/L   Patient temperature 98.6 F     Collection site RADIAL, ALLEN'S TEST ACCEPTABLE  Drawn by :MD     Sample type ARTERIAL     Comment NOTIFIED PHYSICIAN        ASSESSMENT: 1. VF arrest -> cardiogenic shock 2. NSTEMI 3. CAD s/p CABG 2001 4. AFib (? Duration) 5. RBBB (? Duration) 6. COPD with ongoing tobacco use 7. Acute respiratory failure   8. Hypokalemia  PLAN/DISCUSSION:  Once stabilized, he will need to go to cath lab urgently to assess his coronary anatomy. Cath lab activitated. Will start Levophed for BP support as BP 70-80 post intubation. Start heparin. Supp K+. Will need statin. BP too low for b-blocker.   PCCM to admit. As arrest was brief and he followed commands post DC-CV may not need to be cooled. Will d/w PCCM. D/W family.  CCT 1 hour.   Truman Hayward 6:22 PM

## 2012-01-10 NOTE — ED Provider Notes (Addendum)
I saw and evaluated the patient, reviewed the resident's note and I agree with the findings and plan.  The patient was brought in by EMS because of chest pressure that started about 2 hours prior to arrival. Patient has known history of coronary artery disease as well as bypass grafting but has been noncompliant with any followup medical care. While in the emergency room the patient became unresponsive and displayed posturing with tonic activity.  The patient was emergently placed on cardiac monitors and CPR was initiated. The initial rhythm showed ventricular fibrillation. Patient was shocked with 200 J. Spontaneous circulation was restored. When the patient regained consciousness he was very combative and agitated. He was given a milligram of Ativan. His family was brought to the bedside to help reassure him. She is presently denying any pain although he is still complaining of shortness of breath and is still somewhat noncompliant with her care. Laboratory tests have been ordered. He will be monitored closely. I have spoken with Dr. Tenny Craw of cardiology who will be coming to evaluate the patient.  His initial EKG does not show any signs of ST elevation but he does have diffuse ST depression. The symptoms onset concerning for acute coronary syndrome associated with ventricular fibrillation arrest.  CRITICAL CARE Performed by: Linwood Dibbles R Total critical care time: 40 Critical care time was exclusive of separately billable procedures and treating other patients. Critical care was necessary to treat or prevent imminent or life-threatening deterioration. Critical care was time spent personally by me on the following activities: development of treatment plan with patient and/or surrogate as well as nursing, discussions with consultants, evaluation of patient's response to treatment, examination of patient, obtaining history from patient or surrogate, ordering and performing treatments and interventions,  ordering and review of laboratory studies, ordering and review of radiographic studies, pulse oximetry and re-evaluation of patient's condition.   Celene Kras, MD 01/10/12 1749  D/w Dr Teressa Lower.  Will begin cooling after cardiac cath.  Will stat ice packs and sedate.  Celene Kras, MD 01/10/12 [phone]

## 2012-01-10 NOTE — Progress Notes (Signed)
Hyperglycemia   SSI ordered 

## 2012-01-10 NOTE — ED Notes (Addendum)
EMS states chest pressure for 2 hours started at rest. C/o SOB, nausea no vomiting. Given 325 asa, nitro x4, 6mg  morphine given per EMS pain decrease from 5 to 2.

## 2012-01-10 NOTE — Progress Notes (Addendum)
ANTICOAGULATION CONSULT NOTE - Initial Consult  Pharmacy Consult for heparin Indication: chest pain/ACS  Allergies  Allergen Reactions  . Codeine Nausea And Vomiting    Patient Measurements: weight = estimated 70 kg, height= estimated 5'9   Heparin Dosing Weight: 70kg  Vital Signs:    Labs:  Basename 01/10/12 1756  HGB 16.3  HCT 48.0  PLT --  APTT --  LABPROT --  INR --  HEPARINUNFRC --  CREATININE 1.00  CKTOTAL --  CKMB --  TROPONINI --    CrCl is unknown because there is no height on file for the current visit.   Medical History: Past Medical History  Diagnosis Date  . CAD (coronary artery disease)     s/p CABG 2001  . COPD (chronic obstructive pulmonary disease)     ongoing tobacco use  . GERD (gastroesophageal reflux disease)   . Anemia   . Noncompliance with medication regimen     Assessment: Patient is a 61 y.o M admitted to the ED with CP.  On no anticoagulations at home per family member. VFib arrest in ED, CPR x1 min. Now intubated with plan possible for cath today.   Goal of Therapy:  Heparin level 0.3-0.7 units/ml Monitor platelets by anticoagulation protocol: Yes   Plan:  1) heparin 4000 units x1 bolus, then drip at 850 units/hr 2) will get heparin level at midnight tonight if patient doesn't go to cath.  Franklin Marks P 01/10/2012,6:11 PM

## 2012-01-11 ENCOUNTER — Inpatient Hospital Stay (HOSPITAL_COMMUNITY): Payer: PRIVATE HEALTH INSURANCE

## 2012-01-11 ENCOUNTER — Encounter (HOSPITAL_COMMUNITY): Payer: Self-pay | Admitting: General Practice

## 2012-01-11 DIAGNOSIS — I2510 Atherosclerotic heart disease of native coronary artery without angina pectoris: Secondary | ICD-10-CM

## 2012-01-11 LAB — CBC
HCT: 40.5 % (ref 39.0–52.0)
Hemoglobin: 14.3 g/dL (ref 13.0–17.0)
MCHC: 35.3 g/dL (ref 30.0–36.0)
MCV: 90.6 fL (ref 78.0–100.0)
RDW: 12.8 % (ref 11.5–15.5)

## 2012-01-11 LAB — BASIC METABOLIC PANEL
Calcium: 7.9 mg/dL — ABNORMAL LOW (ref 8.4–10.5)
GFR calc Af Amer: 85 mL/min — ABNORMAL LOW (ref >90)
GFR calc non Af Amer: 74 mL/min — ABNORMAL LOW (ref >90)
Sodium: 145 mEq/L (ref 135–145)

## 2012-01-11 LAB — PROTIME-INR
INR: 1.1 (ref 0.00–1.49)
Prothrombin Time: 14.1 seconds (ref 11.6–15.2)

## 2012-01-11 LAB — GLUCOSE, CAPILLARY
Glucose-Capillary: 305 mg/dL — ABNORMAL HIGH (ref 70–99)
Glucose-Capillary: 342 mg/dL — ABNORMAL HIGH (ref 70–99)

## 2012-01-11 LAB — HEPATIC FUNCTION PANEL
ALT: 154 U/L — ABNORMAL HIGH (ref 0–53)
AST: 168 U/L — ABNORMAL HIGH (ref 0–37)
Alkaline Phosphatase: 84 U/L (ref 39–117)
Bilirubin, Direct: 0.1 mg/dL (ref 0.0–0.3)
Indirect Bilirubin: 0.4 mg/dL (ref 0.3–0.9)

## 2012-01-11 LAB — PRO B NATRIURETIC PEPTIDE: Pro B Natriuretic peptide (BNP): 819.3 pg/mL — ABNORMAL HIGH (ref 0–125)

## 2012-01-11 LAB — POCT I-STAT 3, ART BLOOD GAS (G3+)
O2 Saturation: 99 %
Patient temperature: 96.4
TCO2: 26 mmol/L (ref 0–100)
pCO2 arterial: 37.3 mmHg (ref 35.0–45.0)

## 2012-01-11 LAB — MAGNESIUM: Magnesium: 2.2 mg/dL (ref 1.5–2.5)

## 2012-01-11 LAB — HEPARIN LEVEL (UNFRACTIONATED): Heparin Unfractionated: 0.19 IU/mL — ABNORMAL LOW (ref 0.30–0.70)

## 2012-01-11 LAB — LACTIC ACID, PLASMA: Lactic Acid, Venous: 2.5 mmol/L — ABNORMAL HIGH (ref 0.5–2.2)

## 2012-01-11 LAB — TROPONIN I: Troponin I: 13.57 ng/mL (ref <0.30)

## 2012-01-11 MED ORDER — HEPARIN (PORCINE) IN NACL 100-0.45 UNIT/ML-% IJ SOLN
1300.0000 [IU]/h | INTRAMUSCULAR | Status: DC
  Administered 2012-01-12: 1300 [IU]/h via INTRAVENOUS
  Filled 2012-01-11 (×2): qty 250

## 2012-01-11 MED ORDER — OXYCODONE HCL 5 MG PO TABS
5.0000 mg | ORAL_TABLET | Freq: Four times a day (QID) | ORAL | Status: DC | PRN
  Administered 2012-01-12 – 2012-01-14 (×3): 5 mg via ORAL
  Filled 2012-01-11 (×3): qty 1

## 2012-01-11 MED ORDER — FUROSEMIDE 10 MG/ML IJ SOLN
40.0000 mg | Freq: Every day | INTRAMUSCULAR | Status: DC
  Administered 2012-01-11 – 2012-01-12 (×2): 40 mg via INTRAVENOUS
  Filled 2012-01-11 (×2): qty 4

## 2012-01-11 MED ORDER — BIOTENE DRY MOUTH MT LIQD
15.0000 mL | Freq: Four times a day (QID) | OROMUCOSAL | Status: DC
  Administered 2012-01-11 (×2): 15 mL via OROMUCOSAL

## 2012-01-11 MED ORDER — CHLORHEXIDINE GLUCONATE 0.12 % MT SOLN
15.0000 mL | Freq: Two times a day (BID) | OROMUCOSAL | Status: DC
  Administered 2012-01-11: 15 mL via OROMUCOSAL
  Filled 2012-01-11: qty 15

## 2012-01-11 MED ORDER — INFLUENZA VIRUS VACC SPLIT PF IM SUSP
0.5000 mL | INTRAMUSCULAR | Status: AC
  Administered 2012-01-12: 0.5 mL via INTRAMUSCULAR
  Filled 2012-01-11: qty 0.5

## 2012-01-11 MED ORDER — ACETAMINOPHEN 325 MG PO TABS
650.0000 mg | ORAL_TABLET | ORAL | Status: DC | PRN
  Administered 2012-01-11 – 2012-01-14 (×5): 650 mg via ORAL
  Filled 2012-01-11 (×5): qty 2

## 2012-01-11 MED ORDER — PNEUMOCOCCAL VAC POLYVALENT 25 MCG/0.5ML IJ INJ
0.5000 mL | INJECTION | INTRAMUSCULAR | Status: AC
  Administered 2012-01-12: 0.5 mL via INTRAMUSCULAR
  Filled 2012-01-11: qty 0.5

## 2012-01-11 MED ORDER — ONDANSETRON HCL 4 MG PO TABS
4.0000 mg | ORAL_TABLET | Freq: Three times a day (TID) | ORAL | Status: DC | PRN
  Administered 2012-01-11 – 2012-01-13 (×4): 4 mg via ORAL
  Filled 2012-01-11 (×4): qty 1

## 2012-01-11 NOTE — Progress Notes (Signed)
DC'd Swan and introducer in R femoral vein. Heparin held one hour prior to removal and one hour post removal.

## 2012-01-11 NOTE — Progress Notes (Signed)
ANTICOAGULATION CONSULT NOTE - Follow Up Consult  Pharmacy Consult for heparin Indication: chest pain s/p cath  Allergies  Allergen Reactions  . Codeine Nausea And Vomiting    Patient Measurements: Height: 5\' 8"  (172.7 cm) IBW/kg (Calculated) : 68.4  Heparin Dosing Weight: 70 kg  Vital Signs: Temp: 95.4 F (35.2 C) (10/25 2330) BP: 86/65 mmHg (10/25 2330) Pulse Rate: 79  (10/25 2330)  Labs:  Basename 01/10/12 1841 01/10/12 1840 01/10/12 1756 01/10/12 1734  HGB -- -- 16.3 15.8  HCT -- -- 48.0 46.9  PLT -- -- -- 198  APTT -- -- -- 31  LABPROT -- -- -- 15.9*  INR -- -- -- 1.30  HEPARINUNFRC -- -- -- --  CREATININE 1.04 -- 1.00 1.15  CKTOTAL -- -- -- --  CKMB -- -- -- --  TROPONINI -- 5.17* -- --    The CrCl is unknown because both a height and weight (above a minimum accepted value) are required for this calculation.   Medications:  Scheduled:    . aspirin  324 mg Oral Once  . aspirin  324 mg Oral NOW   Or  . aspirin  300 mg Rectal NOW  . etomidate      . famotidine (PEPCID) IV  20 mg Intravenous Q12H  . fentaNYL      . furosemide      . heparin      . heparin  4,000 Units Intravenous Once  . insulin aspart  0-9 Units Subcutaneous Q4H  . lidocaine (cardiac) 100 mg/75ml      . lidocaine      . LORazepam      . midazolam      . midazolam      . midazolam      . nitroGLYCERIN      . potassium chloride      . rocuronium      . sodium bicarbonate      . succinylcholine      . DISCONTD: heparin  60 Units/kg Intravenous STAT  . DISCONTD: norepinephrine (LEVOPHED) Adult infusion  2-50 mcg/min Intravenous Once   Infusions:    . sodium chloride    . amiodarone (NEXTERONE) IV bolus only 150 mg/100 mL Stopped (01/10/12 2017)  . fentaNYL infusion INTRAVENOUS 150 mcg/hr (01/10/12 2330)  . heparin    . midazolam (VERSED) infusion 2 mg/hr (01/10/12 2157)  . norepinephrine (LEVOPHED) Adult infusion    . DISCONTD: heparin 850 Units/hr (01/10/12 1811)     Assessment: 61 yo male with chest pain s/p cath will be restarted on heparin therapy 6hrs after sheath is removed.  Sheath was removed at 1946 per cath report.  Goal of Therapy:  Heparin level 0.3-0.7 units/ml Monitor platelets by anticoagulation protocol: Yes   Plan:  1) Restart heparin at 850 units/hr at 0145 on 01/11/12. 2) Check a 6hr heparin level after drip is restarted. 3) Daily heparin level and CBC  Franklin Marks, Franklin Marks 01/11/2012,12:10 AM

## 2012-01-11 NOTE — Progress Notes (Signed)
Pt extubated to 2L [place] @1053 .  Hr 82, Sat 98%, rr 18, BP 111/77, no stridor, diminished BBS.  RT will continue to monitor.

## 2012-01-11 NOTE — Cardiovascular Report (Signed)
NAMEEVIAN, SALGUERO NO.:  [id number]  MEDICAL RECORD NO.:  [id number]  LOCATION:  2907                         FACILITY:  [facility]  PHYSICIAN:  Nicki Guadalajara, M.D.     DATE OF BIRTH:  1950/06/14  DATE OF PROCEDURE: DATE OF DISCHARGE:                           CARDIAC CATHETERIZATION   PROCEDURE:  Urgent cardiac catheterization with cine coronary angiography; selective angiography into saphenous vein graft; selective angiography into left internal mammary artery; left ventriculography; and Swan-Ganz catheterization.  INDICATIONS:  [name] is a 61 year old gentleman who has a history of COPD with ongoing tobacco use, GERD, and documented coronary artery disease.  He had suffered an MI in 1999.  Apparently had intervention to circumflex vessel.  In 2001, he suffered a non ST-segment MI and underwent CABG revascularization surgery by Dr. Donata Clay and had a RIMA placed to the LAD, and SVG to the first diagonal and SVG to the 2nd diagonal, and SVG to the PDA and PLA vessel.  Apparently in December 2011, he had undergone cardiac catheterization by Dr. Freda Munro in [facility], which apparently revealed the graft, which supplied the diagonal vessel was full of clot and medical therapy was recommended.  Reportedly LV function was fairly normal at that time. The patient has been noncompliant with medications.  This afternoon at approximately 2 p.m., he developed chest pain which lasted for several hours.  He ultimately called EMS.  He presented to [facility] and shortly after arrival developed ventricular fibrillation arrest.  He underwent CPR, was treated with epinephrine and defibrillated.  He was seen by Critical Care and admitted and seen in consultation by Dr. Gala Romney.  ECG showed atrial fibrillation with right bundle-branch block without ST-segment elevation or depression.  He is now taken up to the catheterization laboratory for urgent  catheterization.  PROCEDURE:  Upon arrival to the catheterization laboratory, the patient was intubated and was started on sedation.  Right femoral artery was punctured anteriorly and a 6-French sheath was inserted without difficulty.  Diagnostic catheterization was done utilizing 6-French Judkins 4 left and right coronary catheters.  The right catheter was also used for selective angiography into the 3 saphenous vein grafts as well as into the left internal mammary artery.  A pigtail catheter was then inserted and RAO ventriculography was performed.  At this time, it was felt the patient required Swan-Ganz catheterization.  His right femoral vein was then punctured and 7-French venous sheath was inserted. Swan-Ganz catheter was advanced to the Franklin Marks, RV, PA, and PC positions. Fick cardiac output determination was performed.  The Swan-Ganz catheter was sutured in place.  His arterial sheath was removed in the cath lab. The patient re-transported to the Coronary Care Unit under Critical Care Service and [facility] Service.  HEMODYNAMIC DATA:  Initial central aortic pressure 145/94.  Left ventricle pressure 145/23.  Post A-wave 32.  Swan-Ganz catheterization revealed Franklin Marks pressure mean of 15, A-wave 19, RV pressure 57/14, PA pressure 57/34 with a mean of 44 and mean pulmonary capillary wedge pressure of 33.  ANGIOGRAPHIC DATA:  Left main coronary artery was a long normal vessel, which bifurcated into an LAD and left circumflex system.  The LAD was totally occluded proximally.  The circumflex vessel was then bypass and had 30% narrowing in its proximal segment.  The right coronary artery was totally occluded at its origin.  The vein graft supplying the right coronary artery was widely patent. The native RCA was diffusely diseased.  There was filling antegrade up to the mid RCA where there was 90% stenosis.  Distal to the graft anastomosis after the PDA vessel, there was diffuse  80% stenosis and then in the PLA vessel, there was 80% to 90% stenosis.  The most superior vein graft was filled with clot and this is old and apparently was noted in the report from December 2011 done at [facility]. The 2nd vein graft supplied the diagonal vessel which was widely patent, and had mild 20% narrowing proximally.  LIMA graft was widely patent and anastomosed into the mid LAD.  Distal to the LIMA insertion, there was 80% LAD stenosis.  Antegrade to the main insertion, the LAD was diffusely diseased proximally with diffuse 80% stenosis in the region of the septal proximal septal perforating artery.  RAO ventriculography revealed ejection fraction of approximately 25%. There was severe hypokinesis of the mid inferior wall and akinesis anterolaterally.  IMPRESSION: 1. Ischemic cardiomyopathy with ejection fraction of approximately 25%     with focal akinesis of the anterolateral wall and severe inferior     hypokinesis. 2. Severe native coronary artery disease with total occlusion of the     proximal left anterior descending, 20% to 30% circumflex stenosis     in the bypass circumflex, and total occlusion of the right coronary     artery at its origin. 3. Patent left internal mammary artery graft to the left anterior     descending, but with 80% stenosis in the left anterior descending     beyond the graft insertion. 4. Old occlusion of a clot laden vein graft which had supplied the     second diagonal vessel. 5. Patent vein graft supplying the first diagonal vessel with mild 20%     narrowing. 6. Patent vein graft supplying the right coronary artery with native     distal RCA diffuse disease with 80% to 90% stenosis distally after     the PDA takeoff and 80% to 90% in the PLA vessel.          ______________________________ Nicki Guadalajara, M.D.     TK/MEDQ  D:  01/10/2012  T:  01/11/2012  Job:  [id number]  cc:   Bevelyn Buckles. Bensimhon, MD

## 2012-01-11 NOTE — Progress Notes (Signed)
Name: Orval Gatley MRN: [id number] DOB: Jun 05, 1950    LOS: 1  Referring Provider:  Dr Gala Romney Reason for Referral:  VF arrest, VDRF  PULMONARY / CRITICAL CARE MEDICINE  HPI:  Mr. Gortney is a 61 y/o male with a h/o COPD with ongoing tobacco use (not on BD's), anemia, GERD and CAD s/p CABG '01. Being admitted with unstable angina and subsequent VF arrest in the [facility] ED. ECG without ST elevation, does have R BBB. Underwent CPR x 1 minute, received epi x 1, amiodarone, heparin gtt. Intubated in the ED with some transient shock that resolved with resolution of sedation. Now in the cath lab for urgent coronary angiography. PCCM to admit.   Brief patient description:  61 yo man with COPD, CAD/CABG, s/p VF arrest and successful resuscitation.   Events Since Admission: Cath lab results 10/25 >>  LM: nl ; LAD: occluded proximally ; Lcx: 30% proximal  RCA: occluded ostially post conus  LIMA to LAD: patent, 80% stenosis in LAD after anastomosis  SVG to Dx2: old occlusion with thrombus (was apparently present in 02/2010 cath at [facility])  SVG to DX: patent  SVG to RCA: patent but with diffuse distal native RCA beyond insertion with 80-90% post PDA and in PLA  Swan: RA mean 15, a 19, RV: 56/14, PA: 56/34 , PC: 33   Current Status: Critical  Subjective Awake and tolerating PSV, uncomfortable  Vital Signs: Temp:  [94.3 F (34.6 C)-97.2 F (36.2 C)] 96.8 F (36 C) (10/26 0600) Pulse Rate:  [62-104] 70  (10/26 0600) Resp:  [12-31] 17  (10/26 0600) BP: (75-156)/(48-102) 102/73 mmHg (10/26 0600) SpO2:  [98 %-100 %] 99 % (10/26 0600) FiO2 (%):  [30 %-100 %] 30 % (10/26 0834) Weight:  [73.7 kg (162 lb 7.7 oz)] 73.7 kg (162 lb 7.7 oz) (10/26 0100)  Intake/Output Summary (Last 24 hours) at 01/11/12 1038 Last data filed at 01/11/12 0600  Gross per 24 hour  Intake    180 ml  Output   2500 ml  Net  -2320 ml    Physical Examination: General:  intubated, uncomfortable Neuro:  Awake, moves all  ext HEENT:  ETT in place, pupils 4mm, reactive Neck:  No LAD Cardiovascular:  Regular, no M, no edema Lungs:  Clear B, no wheeze Abdomen:  Soft, NT, no BS heard Musculoskeletal:  No deformitied Skin:  Cool extremities, no rashes  Principal Problem:  *Ventricular fibrillation Active Problems:  NSTEMI (non-ST elevated myocardial infarction)  Cardiac arrest  Cardiogenic shock  CAD (coronary artery disease)  Acute respiratory failure  COPD (chronic obstructive pulmonary disease)  Tobacco abuse   ASSESSMENT AND PLAN  PULMONARY  Lab 01/11/12 0405 01/10/12 2309 01/10/12 1942 01/10/12 1936 01/10/12 1858 01/10/12 1757  PHART 7.430 7.402 7.353 -- 7.246* 7.019*  PCO2ART 37.3 37.4 44.4 -- 42.3 40.0  PO2ART 107.0* 199.0* 216.0* -- 328.0* 59.0*  HCO3 25.0* 23.5 24.7* 24.6* 18.4* --  O2SAT 99.0 100.0 100.0 69.0 100.0 --   Ventilator Settings: Vent Mode:  [-] PRVC FiO2 (%):  [30 %-100 %] 30 % Set Rate:  [16 bmp] 16 bmp Vt Set:  [550 mL-620 mL] 550 mL PEEP:  [5 cmH20] 5 cmH20 Plateau Pressure:  [13 cmH20-16 cmH20] 16 cmH20 CXR:  10/25 >> mild interstitial prominence B  ETT:  10/25 >>   A:  VDRF s/p arrest P:   - assess for extubation 10/26   CARDIOVASCULAR  Lab 01/11/12 0448 01/11/12 0445 01/11/12 0444 01/11/12 0023 01/10/12 1840  TROPONINI 13.57* -- -- 9.01* 5.17*  LATICACIDVEN -- 2.5* -- -- --  PROBNP -- -- 819.3* -- --   ECG:  A Fib with R BBB Lines:  Femoral PA-c 10/25 >>  Femoral art line 10/25 >>   A: VF arrest. Suspect due to NSTEMI. With RBBB could consider other causes arrest such as PE (doubt) New (presumed ischemic) CM; PAOP normalized 10/26 post-acute event P:  - heparin gtt started for possible ACS, A fib, also as empiric rx for possible PE - amiodarone d/c'd 10/26 - ASA - TTE pending - d/c PA-c and cordis, art line 10/26   RENAL  Lab 01/11/12 0448 01/10/12 1841 01/10/12 1756 01/10/12 1734  NA 145 139 142 141  K 3.5 3.8 -- --  CL 107 102 110 102    CO2 27 24 -- 11*  BUN 13 12 12 12   CREATININE 1.07 1.04 1.00 1.15  CALCIUM 7.9* 7.5* -- 8.9  MG 2.2 -- -- --  PHOS 3.1 -- -- --   Intake/Output      10/25 0701 - 10/26 0700 10/26 0701 - 10/27 0700   I.V. (mL/kg) 130 (1.8)    IV Piggyback 50    Total Intake(mL/kg) 180 (2.4)    Urine (mL/kg/hr) 2500 (1.4)    Total Output 2500    Net -2320          Foley:  10/25 >>   A:  hypokalemia P:   - follow BMP  GASTROINTESTINAL  Lab 01/11/12 0448 01/10/12 1734  AST 168* 121*  ALT 154* 138*  ALKPHOS 84 93  BILITOT 0.5 0.5  PROT 6.5 7.3  ALBUMIN 3.5 3.7    A:  Transaminitis without hyperbilirubinema, etiology unclear; INR normal P:   - trend LFT - hepatitis panel >>   HEMATOLOGIC  Lab 01/11/12 0448 01/10/12 1756 01/10/12 1734  HGB 14.3 16.3 15.8  HCT 40.5 48.0 46.9  PLT 184 -- 198  INR 1.10 -- 1.30  APTT -- -- 31   A:  No issues P:    INFECTIOUS  Lab 01/11/12 0448 01/10/12 1734  WBC 14.8* 12.2*  PROCALCITON -- --   Cultures: none Antibiotics: none  A:  No evidence for infection P:     ENDOCRINE  Lab 01/11/12 0719 01/11/12 0413 01/10/12 2325 01/10/12 2226 01/10/12 2120  GLUCAP 108* 134* 283* 342* 305*   A:  No hx DM P:   - SSI per protocol  NEUROLOGIC  A:  Encephalopathy >> Significant improvement 10/26 P:   - follow, minimize sedating meds  BEST PRACTICE / DISPOSITION Level of Care:  ICU Primary Service: [place] cards Consultants:  PCCM Code Status:  Full Diet:  NPO DVT Px:  Heparin gtt GI Px:  pepcid Skin Integrity:  Intact  Social / Family:  Discussed with pt's wife at bedside in the ED  CC time 40 minutes  Levy Pupa, MD, PhD 01/11/2012, 10:38 AM [facility] [phone] or if no answer [phone]

## 2012-01-11 NOTE — Progress Notes (Signed)
Franklin Marks  61 y.o.  male  Subjective: Intubated; awake and reasonably alert following decrease in sedation; uncomfortable with respect to endotracheal tube.  Allergy: Codeine  Objective: Vital signs in last 24 hours: Temp:  [94.3 F (34.6 C)-97.2 F (36.2 C)] 96.8 F (36 C) (10/26 0600) Pulse Rate:  [62-104] 70  (10/26 0600) Resp:  [12-31] 17  (10/26 0600) BP: (75-156)/(48-102) 102/73 mmHg (10/26 0600) SpO2:  [98 %-100 %] 99 % (10/26 0600) FiO2 (%):  [30 %-100 %] 30 % (10/26 0834) Weight:  [73.7 kg (162 lb 7.7 oz)] 73.7 kg (162 lb 7.7 oz) (10/26 0100)  73.7 kg (162 lb 7.7 oz) Body mass index is 24.70 kg/(m^2).  Weight change:    Intake/Output from previous day: 10/25 0701 - 10/26 0700 In: 180 [I.V.:130; IV Piggyback:50] Out: 2500 [Urine:2500] Net I&O since admission: -2.3 L  General- Well developed; no acute distress; proportionate weight and height Neck- No JVD, no carotid bruits Lungs- clear lung fields; normal I:E ratio Cardiovascular- normal PMI; normal S1 and S2 Abdomen- normal bowel sounds; soft and non-tender without masses or organomegaly Skin- Warm, no significant lesions Extremities- Nl distal pulses; no edema  Lab Results: Cardiac Markers:   Basename 01/11/12 0448 01/11/12 0023  TROPONINI 13.57* 9.01*   CBC:   Basename 01/11/12 0448 01/10/12 1756 01/10/12 1734  WBC 14.8* -- 12.2*  HGB 14.3 16.3 --  HCT 40.5 48.0 --  PLT 184 -- 198   BMET:  Basename 01/11/12 0448 01/10/12 1841  NA 145 139  K 3.5 3.8  CL 107 102  CO2 27 24  GLUCOSE 127* 329*  BUN 13 12  CREATININE 1.07 1.04  CALCIUM 7.9* 7.5*   Hepatic Function:   Basename 01/11/12 0448  PROT 6.5  ALBUMIN 3.5  AST 168*  ALT 154*  ALKPHOS 84  BILITOT 0.5  BILIDIR 0.1  IBILI 0.4   EKG:  Pending  Imaging Studies/Results: Portable Chest Xray In Am  01/11/2012  *RADIOLOGY REPORT*  Clinical Data: Shortness of breath, intubated  PORTABLE CHEST - 1 VIEW  Comparison:   the previous day's  study  Findings: Endotracheal tube tip 3.2 cm above carina.  Nasogastric tube loops in the stomach.  A femoral Swan-Ganz catheter extends into a peripheral branch of the right pulmonary artery and could be retracted several centimeters.  Mild diffuse interstitial edema or infiltrates largely stable.  No effusion.  Heart size within normal limits for technique.  IMPRESSION:  1.  Stable interstitial edema. 2.  Support hardware placement as above.   Original Report Authenticated By: Osa Craver, M.D.    Dg Chest Port 1 View  01/10/2012  *RADIOLOGY REPORT*  Clinical Data: ET tube, infiltrates  PORTABLE CHEST - 1 VIEW  Comparison: 01/10/2012  Findings: Endotracheal tube terminates 3.5 cm above the carina.  Increased interstitial markings, favored to reflect mild interstitial edema.  No pleural effusion or pneumothorax.  The heart is normal in size. Postsurgical changes related to prior CABG.  Enteric tube looped within the gastric cardia.  Catheter with its tip in the right main pulmonary artery, likely a Swan-Ganz catheter, although via femoral approach.  IMPRESSION: Endotracheal tube terminates 3.5 cm above the carina.  Suspected mild interstitial edema.   Original Report Authenticated By: Charline Bills, M.D.    Dg Chest Port 1 View  01/10/2012  *RADIOLOGY REPORT*  Clinical Data: Possible infarct myocardial  PORTABLE CHEST - 1 VIEW  Comparison: 01/10/2012  Findings: Endotracheal tube has been placed with  tip 8.5 cm above the carina.  Vascular pattern normal.  Lungs clear.  IMPRESSION: No acute cardiopulmonary abnormalities.   Original Report Authenticated By: Otilio Carpen, M.D.    Dg Chest Port 1 View  01/10/2012  *RADIOLOGY REPORT*  Clinical Data: Chest pain and respiratory arrest  PORTABLE CHEST - 1 VIEW  Comparison: None  Findings: Previous median sternotomy and CABG procedure. Heart size is normal.  No pleural effusion or edema.  No airspace consolidation.  IMPRESSION:  1.  No acute  cardiopulmonary abnormalities.   Original Report Authenticated By: Rosealee Albee, M.D.    Imaging: Imaging results have been reviewed  Medications: I have reviewed the patient's current medications.  Infusions:     . sodium chloride    . amiodarone (NEXTERONE) IV bolus only 150 mg/100 mL Stopped (01/10/12 2017)  . fentaNYL infusion INTRAVENOUS 100 mcg/hr (01/11/12 0056)  . heparin 850 Units/hr (01/11/12 0500)  . midazolam (VERSED) infusion 4 mg/hr (01/11/12 0531)  . norepinephrine (LEVOPHED) Adult infusion    . DISCONTD: heparin 850 Units/hr (01/10/12 1811)    Assessment/Plan:  Ventricular fibrillation: Despite stable anatomy, troponin level of 14 suggest acute myocardial infarction; no EKGs available in EMR to review; new tracing ordered. Amiodarone has been discontinued. EP consultation and implantation of AICD anticipated.   NSTEMI (non-ST elevated myocardial infarction): Severe left ventricular dysfunction at catheterization. PCW is now normal, but infiltrates/edema persists on chest x-ray. BNP level-819. Net I&O is -0.3 L since admission. Gentle diuresis will be continued as well as intravenous heparin.  Introduce beta blockers, Aldactone and ACE inhibitors as blood pressure permits. Clopidigrel post-MI once heparin discontinued.  Will review cath films.  Relative hypotension: Blood pressure is sagging. Dopamine will be added if required.   Acute respiratory failure: Possibly can be extubated; CCM to evaluate.  Chest x-ray interpreted as demonstrating infiltrates-may represent pulmonary edema that is lagging hemodynamic improvement.     LOS: 1 day   [place] Bing 01/11/2012, 10:16 AM

## 2012-01-11 NOTE — Progress Notes (Signed)
Nausea   Zofran given  

## 2012-01-11 NOTE — Progress Notes (Signed)
Pain   Tylenol and Oxycodone ordered

## 2012-01-11 NOTE — Progress Notes (Signed)
ANTICOAGULATION CONSULT NOTE - Follow Up Consult  Pharmacy Consult for Heparin Indication: ACS, afib, s/p cath  Allergies  Allergen Reactions  . Codeine Nausea And Vomiting    Labs:  Basename 01/11/12 1956 01/11/12 1000 01/11/12 0448 01/11/12 0023 01/10/12 1841 01/10/12 1840 01/10/12 1756 01/10/12 1734  HGB -- -- 14.3 -- -- -- 16.3 --  HCT -- -- 40.5 -- -- -- 48.0 46.9  PLT -- -- 184 -- -- -- -- 198  APTT -- -- -- -- -- -- -- 31  LABPROT -- -- 14.1 -- -- -- -- 15.9*  INR -- -- 1.10 -- -- -- -- 1.30  HEPARINUNFRC 0.15* 0.19* -- -- -- -- -- --  CREATININE -- -- 1.07 -- 1.04 -- 1.00 --  CKTOTAL -- -- -- -- -- -- -- --  CKMB -- -- -- -- -- -- -- --  TROPONINI -- -- 13.57* 9.01* -- 5.17* -- --    Estimated Creatinine Clearance: 71 ml/min (by C-G formula based on Cr of 1.07).  Medications:  Heparin @ 850 units/hr  Assessment: 60yom resumed on heparin s/p cath.  PM heparin level still below goal at 0.15   Goal of Therapy:  Heparin level 0.3-0.7 units/ml Monitor platelets by anticoagulation protocol: Yes   Plan:  1) Increase heparin to 1300 units/hr  2) Follow up AM heparin level and CBC  Elwin Sleight 01/11/2012,8:52 PM

## 2012-01-11 NOTE — Progress Notes (Signed)
ANTICOAGULATION CONSULT NOTE - Follow Up Consult  Pharmacy Consult for Heparin Indication: ACS, afib, r/o PE  Allergies  Allergen Reactions  . Codeine Nausea And Vomiting    Patient Measurements: Height: 5\' 8"  (172.7 cm) Weight: 162 lb 7.7 oz (73.7 kg) IBW/kg (Calculated) : 68.4  Heparin Dosing Weight: 74kg  Vital Signs: Temp: 96.8 F (36 C) (10/26 0600) Temp src: Oral (10/26 0408) BP: 102/73 mmHg (10/26 0600) Pulse Rate: 70  (10/26 0600)  Labs:  Basename 01/11/12 1000 01/11/12 0448 01/11/12 0023 01/10/12 1841 01/10/12 1840 01/10/12 1756 01/10/12 1734  HGB -- 14.3 -- -- -- 16.3 --  HCT -- 40.5 -- -- -- 48.0 46.9  PLT -- 184 -- -- -- -- 198  APTT -- -- -- -- -- -- 31  LABPROT -- 14.1 -- -- -- -- 15.9*  INR -- 1.10 -- -- -- -- 1.30  HEPARINUNFRC 0.19* -- -- -- -- -- --  CREATININE -- 1.07 -- 1.04 -- 1.00 --  CKTOTAL -- -- -- -- -- -- --  CKMB -- -- -- -- -- -- --  TROPONINI -- 13.57* 9.01* -- 5.17* -- --    Estimated Creatinine Clearance: 71 ml/min (by C-G formula based on Cr of 1.07).  Medications:  Heparin @ 850 units/hr  Assessment: 60yom resumed on heparin s/p cath. Initial heparin level is below goal. Heparin gtt turned off ~1030 to pull patient's lines and will remain off for about 2 hours. Will resume at a higher rate. CBC stable. No bleeding.  Goal of Therapy:  Heparin level 0.3-0.7 units/ml Monitor platelets by anticoagulation protocol: Yes   Plan:  1) Resume heparin at 1050 units/hr ~1230 2) 6h heparin level after resumed  Fredrik Rigger 01/11/2012,11:10 AM

## 2012-01-11 NOTE — Progress Notes (Signed)
INITIAL ADULT NUTRITION ASSESSMENT Date: 01/11/2012   Time: 9:49 AM Reason for Assessment: Consult for TF recommendations, ventilated  INTERVENTION: Recommend initiate Osmolite 1.2 start at 41ml/hr increase by 10 ml q4hr to goal of 16ml/hr with Prostat 30ml daily - this will provide 1684 calories, 88g protein, and free water (105% estimated calorie needs, 97% estimated protein needs). If IVF d/c, recommend water flushes q6hr. RD to continue to monitor plan for nutrition.    ASSESSMENT: Male 61 y.o.  Dx: Ventricular fibrillation  Food/Nutrition Related Hx: Pt admitted with chest pain, found to have ventricular fibrillation, non-ST elevated MI, and cardiac arrest and was intubated.   Hx:  Past Medical History  Diagnosis Date  . CAD (coronary artery disease)     s/p CABG 2001  . COPD (chronic obstructive pulmonary disease)     ongoing tobacco use  . GERD (gastroesophageal reflux disease)   . Anemia   . Noncompliance with medication regimen    Related Meds:  Scheduled Meds:   . antiseptic oral rinse  15 mL Mouth Rinse QID  . aspirin  324 mg Oral Once  . aspirin  324 mg Oral NOW   Or  . aspirin  300 mg Rectal NOW  . chlorhexidine  15 mL Mouth Rinse BID  . etomidate      . famotidine (PEPCID) IV  20 mg Intravenous Q12H  . fentaNYL      . furosemide      . heparin      . heparin  4,000 Units Intravenous Once  . influenza  inactive virus vaccine  0.5 mL Intramuscular Tomorrow-1000  . insulin aspart  0-9 Units Subcutaneous Q4H  . lidocaine (cardiac) 100 mg/52ml      . lidocaine      . LORazepam      . midazolam      . midazolam      . midazolam      . nitroGLYCERIN      . pneumococcal 23 valent vaccine  0.5 mL Intramuscular Tomorrow-1000  . potassium chloride      . rocuronium      . sodium bicarbonate      . succinylcholine      . DISCONTD: heparin  60 Units/kg Intravenous STAT  . DISCONTD: norepinephrine (LEVOPHED) Adult infusion  2-50 mcg/min Intravenous  Once   Continuous Infusions:   . sodium chloride    . amiodarone (NEXTERONE) IV bolus only 150 mg/100 mL Stopped (01/10/12 2017)  . fentaNYL infusion INTRAVENOUS 100 mcg/hr (01/11/12 0056)  . heparin 850 Units/hr (01/11/12 0500)  . midazolam (VERSED) infusion 4 mg/hr (01/11/12 0531)  . norepinephrine (LEVOPHED) Adult infusion    . DISCONTD: heparin 850 Units/hr (01/10/12 1811)   PRN Meds:.sodium chloride, amiodarone (NEXTERONE) IV bolus only 150 mg/100 mL, EPINEPHrine, etomidate, fentaNYL, ipratropium, midazolam, midazolam, rocuronium, sodium bicarbonate  Ht: 5\' 8"  (172.7 cm)  Wt: 162 lb 7.7 oz (73.7 kg)  Ideal Wt: 154 lb % Ideal Wt: 105  Usual Wt: Unable to assess % Usual Wt: Unable to assess   Body mass index is 24.70 kg/(m^2).   Labs:  CMP     Component Value Date/Time   NA 145 01/11/2012 0448   K 3.5 01/11/2012 0448   CL 107 01/11/2012 0448   CO2 27 01/11/2012 0448   GLUCOSE 127* 01/11/2012 0448   BUN 13 01/11/2012 0448   CREATININE 1.07 01/11/2012 0448   CALCIUM 7.9* 01/11/2012 0448   PROT 6.5 01/11/2012 0448  ALBUMIN 3.5 01/11/2012 0448   AST 168* 01/11/2012 0448   ALT 154* 01/11/2012 0448   ALKPHOS 84 01/11/2012 0448   BILITOT 0.5 01/11/2012 0448   GFRNONAA 74* 01/11/2012 0448   GFRAA 85* 01/11/2012 0448    Intake/Output Summary (Last 24 hours) at 01/11/12 0953 Last data filed at 01/11/12 0600  Gross per 24 hour  Intake    180 ml  Output   2500 ml  Net  -2320 ml   Last BM - PTA   Diet Order: NPO   IVF:    sodium chloride   amiodarone (NEXTERONE) IV bolus only 150 mg/100 mL Last Rate: Stopped (01/10/12 2017)  fentaNYL infusion INTRAVENOUS Last Rate: 100 mcg/hr (01/11/12 0056)  heparin Last Rate: 850 Units/hr (01/11/12 0500)  midazolam (VERSED) infusion Last Rate: 4 mg/hr (01/11/12 0531)  norepinephrine (LEVOPHED) Adult infusion   DISCONTD: heparin Last Rate: 850 Units/hr (01/10/12 1811)    Estimated Nutritional Needs:   Kcal:  1600 Protein: 90-100g Fluid: >1.6L  NUTRITION DIAGNOSIS: -Inadequate oral intake (NI-2.1).  Status: Ongoing  RELATED TO: inability to eat  AS EVIDENCE BY: NPO, ventilated   MONITORING/EVALUATION(Goals): If no plans for extubation in the next 1-2 days, recommend initiate enteral nutrition with goal of TF meeting >90% of estimated nutritional needs.   EDUCATION NEEDS: -Education not appropriate at this time   Dietitian #: [phone]  DOCUMENTATION CODES Per approved criteria  -Not Applicable    Marshall Cork 01/11/2012, 9:49 AM

## 2012-01-12 ENCOUNTER — Inpatient Hospital Stay (HOSPITAL_COMMUNITY): Payer: PRIVATE HEALTH INSURANCE

## 2012-01-12 DIAGNOSIS — I4901 Ventricular fibrillation: Secondary | ICD-10-CM

## 2012-01-12 DIAGNOSIS — R739 Hyperglycemia, unspecified: Secondary | ICD-10-CM

## 2012-01-12 DIAGNOSIS — I214 Non-ST elevation (NSTEMI) myocardial infarction: Secondary | ICD-10-CM

## 2012-01-12 LAB — BASIC METABOLIC PANEL
CO2: 23 mEq/L (ref 19–32)
Chloride: 99 mEq/L (ref 96–112)
GFR calc non Af Amer: >90 mL/min (ref >90)
Glucose, Bld: 108 mg/dL — ABNORMAL HIGH (ref 70–99)
Potassium: 3.3 mEq/L — ABNORMAL LOW (ref 3.5–5.1)
Sodium: 134 mEq/L — ABNORMAL LOW (ref 135–145)

## 2012-01-12 LAB — CBC
Hemoglobin: 13.6 g/dL (ref 13.0–17.0)
MCHC: 34.8 g/dL (ref 30.0–36.0)
RBC: 4.29 MIL/uL (ref 4.22–5.81)
WBC: 12 10*3/uL — ABNORMAL HIGH (ref 4.0–10.5)

## 2012-01-12 LAB — LIPID PANEL
Cholesterol: 134 mg/dL (ref 0–200)
Triglycerides: 127 mg/dL (ref <150)
VLDL: 25 mg/dL (ref 0–40)

## 2012-01-12 LAB — HEPATIC FUNCTION PANEL
ALT: 117 U/L — ABNORMAL HIGH (ref 0–53)
Indirect Bilirubin: 0.8 mg/dL (ref 0.3–0.9)
Total Protein: 6.7 g/dL (ref 6.0–8.3)

## 2012-01-12 LAB — HEPARIN LEVEL (UNFRACTIONATED)
Heparin Unfractionated: 0.17 IU/mL — ABNORMAL LOW (ref 0.30–0.70)
Heparin Unfractionated: 0.35 IU/mL (ref 0.30–0.70)

## 2012-01-12 LAB — PHOSPHORUS: Phosphorus: 2.1 mg/dL — ABNORMAL LOW (ref 2.3–4.6)

## 2012-01-12 MED ORDER — FUROSEMIDE 40 MG PO TABS
40.0000 mg | ORAL_TABLET | Freq: Every day | ORAL | Status: DC
  Administered 2012-01-13 – 2012-01-15 (×2): 40 mg via ORAL
  Filled 2012-01-12 (×3): qty 1

## 2012-01-12 MED ORDER — POTASSIUM CHLORIDE CRYS ER 20 MEQ PO TBCR
40.0000 meq | EXTENDED_RELEASE_TABLET | Freq: Three times a day (TID) | ORAL | Status: AC
  Administered 2012-01-12 (×2): 40 meq via ORAL
  Filled 2012-01-12 (×2): qty 2

## 2012-01-12 MED ORDER — LISINOPRIL 2.5 MG PO TABS
2.5000 mg | ORAL_TABLET | Freq: Every day | ORAL | Status: DC
  Administered 2012-01-12 – 2012-01-15 (×4): 2.5 mg via ORAL
  Filled 2012-01-12 (×4): qty 1

## 2012-01-12 MED ORDER — HEPARIN BOLUS VIA INFUSION
2000.0000 [IU] | Freq: Once | INTRAVENOUS | Status: AC
  Administered 2012-01-12: 2000 [IU] via INTRAVENOUS
  Filled 2012-01-12: qty 2000

## 2012-01-12 MED ORDER — CARVEDILOL 3.125 MG PO TABS
3.1250 mg | ORAL_TABLET | Freq: Two times a day (BID) | ORAL | Status: DC
  Administered 2012-01-12 – 2012-01-15 (×7): 3.125 mg via ORAL
  Filled 2012-01-12 (×8): qty 1

## 2012-01-12 MED ORDER — ASPIRIN 81 MG PO CHEW
81.0000 mg | CHEWABLE_TABLET | Freq: Every day | ORAL | Status: DC
  Administered 2012-01-12 – 2012-01-15 (×4): 81 mg via ORAL
  Filled 2012-01-12 (×3): qty 1

## 2012-01-12 MED ORDER — HEPARIN (PORCINE) IN NACL 100-0.45 UNIT/ML-% IJ SOLN
1550.0000 [IU]/h | INTRAMUSCULAR | Status: DC
  Filled 2012-01-12 (×3): qty 250

## 2012-01-12 MED ORDER — POTASSIUM CHLORIDE CRYS ER 20 MEQ PO TBCR
40.0000 meq | EXTENDED_RELEASE_TABLET | Freq: Once | ORAL | Status: AC
  Administered 2012-01-12: 40 meq via ORAL
  Filled 2012-01-12: qty 2

## 2012-01-12 NOTE — Progress Notes (Signed)
  Echocardiogram 2D Echocardiogram has been performed.  Cathie Beams 01/12/2012, 12:41 PM

## 2012-01-12 NOTE — Progress Notes (Signed)
Name: Venancio Chenier MRN: [id number] DOB: 1950-06-28    LOS: 2  Referring Provider:  Dr Gala Romney Reason for Referral:  VF arrest, VDRF  PULMONARY / CRITICAL CARE MEDICINE  HPI:  Mr. Elison is a 61 y/o male with a h/o COPD with ongoing tobacco use (not on BD's), anemia, GERD and CAD s/p CABG '01. Being admitted with unstable angina and subsequent VF arrest in the [facility] ED. ECG without ST elevation, does have R BBB. Underwent CPR x 1 minute, received epi x 1, amiodarone, heparin gtt. Intubated in the ED with some transient shock that resolved with resolution of sedation. Now in the cath lab for urgent coronary angiography. PCCM to admit.   Brief patient description:  61 yo man with COPD, CAD/CABG, s/p VF arrest and successful resuscitation.   Events Since Admission: Cath lab results 10/25 >>  LM: nl ; LAD: occluded proximally ; Lcx: 30% proximal  RCA: occluded ostially post conus  LIMA to LAD: patent, 80% stenosis in LAD after anastomosis  SVG to Dx2: old occlusion with thrombus (was apparently present in 02/2010 cath at [facility])  SVG to DX: patent  SVG to RCA: patent but with diffuse distal native RCA beyond insertion with 80-90% post PDA and in PLA  Swan: RA mean 15, a 19, RV: 56/14, PA: 56/34 , PC: 33   Current Status: Guarded   Subjective Tolerated extubation Awake, MS intact  Vital Signs: Temp:  [97.7 F (36.5 C)-99.2 F (37.3 C)] 98.9 F (37.2 C) (10/27 0800) Pulse Rate:  [65-83] 79  (10/27 0800) Resp:  [10-21] 17  (10/27 0800) BP: (94-124)/(63-88) 124/77 mmHg (10/27 0755) SpO2:  [93 %-100 %] 97 % (10/27 0800) Weight:  [70.7 kg (155 lb 13.8 oz)] 70.7 kg (155 lb 13.8 oz) (10/27 0436)  Intake/Output Summary (Last 24 hours) at 01/12/12 0955 Last data filed at 01/12/12 0800  Gross per 24 hour  Intake 1653.17 ml  Output   2350 ml  Net -696.83 ml    Physical Examination: General:  Ill appearing, NAD Neuro:  Awake, moves all ext HEENT:  pupils 4mm, reactive Neck:  No  LAD Cardiovascular:  Regular, no M, no edema Lungs:  Clear B, no wheeze Abdomen:  Soft, NT, no BS heard Musculoskeletal:  No deformitied Skin:  Cool extremities, no rashes  Principal Problem:  *Ventricular fibrillation Active Problems:  NSTEMI (non-ST elevated myocardial infarction)  Cardiac arrest  Cardiogenic shock  CAD (coronary artery disease)  Acute respiratory failure  COPD (chronic obstructive pulmonary disease)  Tobacco abuse   ASSESSMENT AND PLAN  PULMONARY  Lab 01/11/12 0405 01/10/12 2309 01/10/12 1942 01/10/12 1936 01/10/12 1858 01/10/12 1757  PHART 7.430 7.402 7.353 -- 7.246* 7.019*  PCO2ART 37.3 37.4 44.4 -- 42.3 40.0  PO2ART 107.0* 199.0* 216.0* -- 328.0* 59.0*  HCO3 25.0* 23.5 24.7* 24.6* 18.4* --  O2SAT 99.0 100.0 100.0 69.0 100.0 --   Ventilator Settings:   CXR:  10/25 >> mild interstitial prominence B  ETT:  10/25 >> 10/26  A:  VDRF s/p arrest P:   - pulm hygiene  CARDIOVASCULAR  Lab 01/12/12 0630 01/11/12 0448 01/11/12 0445 01/11/12 0444 01/11/12 0023 01/10/12 1840  TROPONINI -- 13.57* -- -- 9.01* 5.17*  LATICACIDVEN -- -- 2.5* -- -- --  PROBNP 5523.0* -- -- 819.3* -- --   ECG:  A Fib with R BBB Lines:  Femoral PA-c 10/25 >> 10/26 Femoral art line 10/25 >> 10/26  A: VF arrest. Suspect due to NSTEMI. With RBBB could  consider other causes arrest such as PE (doubt) New (presumed ischemic) CM; PAOP normalized 10/26 post-acute event P:  - heparin gtt started for ACS, A fib - amiodarone d/c'd 10/26 - ASA - TTE pending - d/c'd PA-c and cordis, art line 10/26   RENAL  Lab 01/12/12 0630 01/11/12 0448 01/10/12 1841 01/10/12 1756 01/10/12 1734  NA 134* 145 139 142 141  K 3.3* 3.5 -- -- --  CL 99 107 102 110 102  CO2 23 27 24  -- 11*  BUN 12 13 12 12 12   CREATININE 0.87 1.07 1.04 1.00 1.15  CALCIUM 8.6 7.9* 7.5* -- 8.9  MG 1.9 2.2 -- -- --  PHOS 2.1* 3.1 -- -- --   Intake/Output      10/26 0701 - 10/27 0700 10/27 0701 - 10/28 0700    P.O. 960 120   I.V. (mL/kg) 508.4 (7.2) 23 (0.3)   IV Piggyback 104    Total Intake(mL/kg) 1572.4 (22.2) 143 (2)   Urine (mL/kg/hr) 2225 (1.3) 275   Total Output 2225 275   Net -652.6 -132        Urine Occurrence 1 x     Foley:  10/25 >>   A:  hypokalemia P:   - replace K - follow BMP  GASTROINTESTINAL  Lab 01/12/12 0630 01/11/12 0448 01/10/12 1734  AST 145* 168* 121*  ALT 117* 154* 138*  ALKPHOS 81 84 93  BILITOT 1.0 0.5 0.5  PROT 6.7 6.5 7.3  ALBUMIN 3.3* 3.5 3.7    A:  Transaminitis without hyperbilirubinema, etiology unclear; INR normal P:   - trend LFT - hepatitis panel >>   HEMATOLOGIC  Lab 01/12/12 0630 01/11/12 0448 01/10/12 1756 01/10/12 1734  HGB 13.6 14.3 16.3 15.8  HCT 39.1 40.5 48.0 46.9  PLT 129* 184 -- 198  INR -- 1.10 -- 1.30  APTT -- -- -- 31   A:  No issues P:    INFECTIOUS  Lab 01/12/12 0630 01/11/12 0448 01/10/12 1734  WBC 12.0* 14.8* 12.2*  PROCALCITON -- -- --   Cultures: none Antibiotics: none  A:  No evidence for infection P:     ENDOCRINE  Lab 01/12/12 0816 01/12/12 0417 01/11/12 2341 01/11/12 1951 01/11/12 1534  GLUCAP 102* 101* 98 124* 115*   A:  No hx DM P:   - SSI per protocol  NEUROLOGIC  A:  Encephalopathy >> Significant improvement 10/26 P:   - follow, minimize sedating meds  BEST PRACTICE / DISPOSITION Level of Care:  ICU Primary Service: Ada cards Consultants:  PCCM Code Status:  Full Diet:  NPO DVT Px:  Heparin gtt GI Px:  pepcid Skin Integrity:  Intact  Social / Family:  Discussed with pt's wife at bedside  PCCM will sign off, please call if we can assist   Levy Pupa, MD, PhD 01/12/2012, 9:55 AM [facility] [phone] or if no answer [phone]

## 2012-01-12 NOTE — Progress Notes (Addendum)
ANTICOAGULATION CONSULT NOTE - Follow Up Consult  Pharmacy Consult for Heparin Indication: ACS, afib, r/o PE  Allergies  Allergen Reactions  . Codeine Nausea And Vomiting    Patient Measurements: Height: 5\' 8"  (172.7 cm) Weight: 155 lb 13.8 oz (70.7 kg) IBW/kg (Calculated) : 68.4  Heparin Dosing Weight: 74kg  Vital Signs: Temp: 98.4 F (36.9 C) (10/27 0400) Temp src: Oral (10/27 0400) BP: 110/74 mmHg (10/27 0600) Pulse Rate: 77  (10/27 0600)  Labs:  Basename 01/12/12 0630 01/11/12 1956 01/11/12 1000 01/11/12 0448 01/11/12 0023 01/10/12 1841 01/10/12 1840 01/10/12 1756 01/10/12 1734  HGB 13.6 -- -- 14.3 -- -- -- -- --  HCT 39.1 -- -- 40.5 -- -- -- 48.0 --  PLT 129* -- -- 184 -- -- -- -- 198  APTT -- -- -- -- -- -- -- -- 31  LABPROT -- -- -- 14.1 -- -- -- -- 15.9*  INR -- -- -- 1.10 -- -- -- -- 1.30  HEPARINUNFRC 0.17* 0.15* 0.19* -- -- -- -- -- --  CREATININE 0.87 -- -- 1.07 -- 1.04 -- -- --  CKTOTAL -- -- -- -- -- -- -- -- --  CKMB -- -- -- -- -- -- -- -- --  TROPONINI -- -- -- 13.57* 9.01* -- 5.17* -- --    Estimated Creatinine Clearance: 87.4 ml/min (by C-G formula based on Cr of 0.87).   Medications:  Heparin @ 1300 units/hr  Assessment: 60yom resumed on heparin s/p cath. Heparin level remains subtherapeutic despite large rate increase last night. No issues with infusion. Hgb/Hct are stable but platelets slowly trending down. No bleeding noted.  Goal of Therapy:  Heparin level 0.3-0.7 units/ml Monitor platelets by anticoagulation protocol: Yes   Plan:  1) Will give a small bolus 2000 units 2) Increase heparin to 1550 units/hr 3) 6h heparin level  Fredrik Rigger 01/12/2012,8:02 AM   Follow up: Patient having small amount of bleeding at IV site. RN to change IV. No bleeding from groin site. Heparin to continue.  Fredrik Rigger 01/12/2012, 8:41 AM  Follow up heparin level this afternoon is therapeutic at 0.35. Continue heparin at 1550  units/hr and follow up heparin level in the morning.  Fredrik Rigger 01/12/2012, 2:46 PM

## 2012-01-12 NOTE — Progress Notes (Signed)
Franklin Marks  61 y.o.  male  Subjective: Reports chest soreness, but no dyspnea.  Mild anorexia but swallowing without difficulty. Mental status appears normal to me and to family.  Allergy: Codeine  Objective: Vital signs in last 24 hours: Temp:  [97.7 F (36.5 C)-99.2 F (37.3 C)] 98.9 F (37.2 C) (10/27 0800) Pulse Rate:  [65-83] 79  (10/27 0800) Resp:  [10-21] 17  (10/27 0800) BP: (94-124)/(63-88) 98/66 mmHg (10/27 1049) SpO2:  [93 %-100 %] 97 % (10/27 0800) Weight:  [70.7 kg (155 lb 13.8 oz)] 70.7 kg (155 lb 13.8 oz) (10/27 0436)  70.7 kg (155 lb 13.8 oz) Body mass index is 23.70 kg/(m^2).  Weight change: -3 kg (-6 lb 9.8 oz)   Intake/Output from previous day: 10/26 0701 - 10/27 0700 In: 1572.4 [P.O.:960; I.V.:508.4; IV Piggyback:104] Out: 2225 [Urine:2225] Net I&O since admission: -3 L  General- Well developed; no acute distress; proportionate weight and height Neck- No JVD, no carotid bruits Lungs- clear lung fields; normal I:E ratio Cardiovascular- normal PMI; normal S1 and S2 Abdomen- normal bowel sounds; soft and non-tender without masses or organomegaly Skin- Warm, no significant lesions Extremities- Nl distal pulses; no edema  Lab Results: Cardiac Markers:    Basename 01/11/12 0448 01/11/12 0023  TROPONINI 13.57* 9.01*   CBC:    Basename 01/12/12 0630 01/11/12 0448  WBC 12.0* 14.8*  HGB 13.6 14.3  HCT 39.1 40.5  PLT 129* 184   BMET:   Basename 01/12/12 0630 01/11/12 0448  NA 134* 145  K 3.3* 3.5  CL 99 107  CO2 23 27  GLUCOSE 108* 127*  BUN 12 13  CREATININE 0.87 1.07  CALCIUM 8.6 7.9*   Hepatic Function:    Basename 01/12/12 0630  PROT 6.7  ALBUMIN 3.3*  AST 145*  ALT 117*  ALKPHOS 81  BILITOT 1.0  BILIDIR 0.2  IBILI 0.8   EKG: 01/11/12: Normal sinus rhythm, first degree AV block, right bundle branch block; comparison to prior tracing performed 01/10/12, ST segment depression has resolved.  Imaging Studies/Results: Dg Chest Port 1  View  01/12/2012  *RADIOLOGY REPORT*  Clinical Data: Pulmonary infiltrates  PORTABLE CHEST - 1 VIEW  Comparison:   the previous day's study  Findings: The patient has been extubated and the nasogastric tube removed.  The femoral Swan-Ganz catheter has been retracted. Changes of CABG noted.  Improved aeration with significant resolution of the bibasilar atelectasis or infiltrates seen previously.  No effusion.  No pneumothorax.  Postop changes in the right clavicle are incidentally noted.  Heart size is normal.  IMPRESSION:  1.  Interval extubation with improved aeration especially in the lung bases.   Original Report Authenticated By: Osa Craver, M.D.    Portable Chest Xray In Am  01/11/2012  *RADIOLOGY REPORT*  Clinical Data: Shortness of breath, intubated  PORTABLE CHEST - 1 VIEW  Comparison:   the previous day's study  Findings: Endotracheal tube tip 3.2 cm above carina.  Nasogastric tube loops in the stomach.  A femoral Swan-Ganz catheter extends into a peripheral branch of the right pulmonary artery and could be retracted several centimeters.  Mild diffuse interstitial edema or infiltrates largely stable.  No effusion.  Heart size within normal limits for technique.  IMPRESSION:  1.  Stable interstitial edema. 2.  Support hardware placement as above.   Original Report Authenticated By: Osa Craver, M.D.    Imaging: Imaging results have been reviewed  Medications: I have reviewed the  patient's current medications.  Infusions:     . heparin 1,550 Units/hr (01/12/12 0818)    Assessment/Plan:  Ventricular fibrillation: Despite stable anatomy, troponin level of 14 suggest acute myocardial infarction; No diagnostic Q waves on EKGs or ST segment elevation.  Electrophysiology consult pending.  NSTEMI (non-ST elevated myocardial infarction): Severe left ventricular dysfunction at catheterization. Chest x-ray shows improvement in pulmonary infiltrates consistent with resolution  of pulmonary edema and atelectasis.  DC heparin in the a.m.  One year course of clopidogrel would be reasonable, but will be deferred until after AICD has been implanted.  Relative hypotension: Systolics occasionally in the 90s, but no significant hypotension.  Hypokalemia-replacement ordered  Ischemic cardiomyopathy: Initiate very low dose beta blocker and ACE inhibitor  Financial: Patient is unable to afford his medical care. A meeting with financial counseling will be arranged. If a supply of clopidogrel cannot be obtained for him, it would be reasonable to forego this therapy.  LOS: 2 days   Franklin Marks 01/12/2012, 11:19 AM

## 2012-01-13 ENCOUNTER — Encounter (HOSPITAL_COMMUNITY): Payer: Self-pay | Admitting: Internal Medicine

## 2012-01-13 ENCOUNTER — Encounter (HOSPITAL_COMMUNITY)
Admission: EM | Disposition: A | Discharge status: Home / Self Care | Payer: Self-pay | Source: Home / Self Care | Attending: Internal Medicine

## 2012-01-13 DIAGNOSIS — I214 Non-ST elevation (NSTEMI) myocardial infarction: Secondary | ICD-10-CM

## 2012-01-13 DIAGNOSIS — I4891 Unspecified atrial fibrillation: Secondary | ICD-10-CM

## 2012-01-13 HISTORY — DX: Unspecified atrial fibrillation: I48.91

## 2012-01-13 LAB — HEPATITIS PANEL, ACUTE: Hepatitis B Surface Ag: NEGATIVE

## 2012-01-13 LAB — BASIC METABOLIC PANEL
BUN: 15 mg/dL (ref 6–23)
CO2: 25 mEq/L (ref 19–32)
Chloride: 100 mEq/L (ref 96–112)
GFR calc non Af Amer: >90 mL/min (ref >90)
Glucose, Bld: 112 mg/dL — ABNORMAL HIGH (ref 70–99)
Potassium: 4.2 mEq/L (ref 3.5–5.1)

## 2012-01-13 LAB — HEMOGLOBIN A1C
Hgb A1c MFr Bld: 5.7 % — ABNORMAL HIGH (ref <5.7)
Mean Plasma Glucose: 117 mg/dL — ABNORMAL HIGH (ref <117)

## 2012-01-13 SURGERY — IMPLANTABLE CARDIOVERTER DEFIBRILLATOR IMPLANT
Anesthesia: LOCAL

## 2012-01-13 MED ORDER — ATORVASTATIN CALCIUM 20 MG PO TABS
20.0000 mg | ORAL_TABLET | Freq: Every day | ORAL | Status: DC
  Administered 2012-01-13 – 2012-01-15 (×3): 20 mg via ORAL
  Filled 2012-01-13 (×3): qty 1

## 2012-01-13 MED ORDER — CLOPIDOGREL BISULFATE 300 MG PO TABS
300.0000 mg | ORAL_TABLET | Freq: Once | ORAL | Status: AC
  Administered 2012-01-13: 300 mg via ORAL
  Filled 2012-01-13: qty 1

## 2012-01-13 MED ORDER — SPIRONOLACTONE 12.5 MG HALF TABLET
12.5000 mg | ORAL_TABLET | Freq: Every day | ORAL | Status: DC
Start: 2012-01-13 — End: 2012-01-15
  Administered 2012-01-13 – 2012-01-15 (×2): 12.5 mg via ORAL
  Filled 2012-01-13 (×3): qty 1

## 2012-01-13 MED ORDER — POTASSIUM CHLORIDE 10 MEQ/100ML IV SOLN: 10.0000 meq | INTRAVENOUS | Status: DC

## 2012-01-13 MED ORDER — CLOPIDOGREL BISULFATE 75 MG PO TABS
75.0000 mg | ORAL_TABLET | Freq: Every day | ORAL | Status: DC
  Administered 2012-01-14 – 2012-01-15 (×2): 75 mg via ORAL
  Filled 2012-01-13 (×3): qty 1

## 2012-01-13 MED ORDER — PANTOPRAZOLE SODIUM 40 MG IV SOLR
40.0000 mg | INTRAVENOUS | Status: DC
  Administered 2012-01-13: 40 mg via INTRAVENOUS
  Filled 2012-01-13 (×2): qty 40

## 2012-01-13 NOTE — Evaluation (Signed)
Physical Therapy Evaluation Patient Details Name: Franklin Marks MRN: [id number] DOB: 02-May-1950 Today's Date: 01/13/2012 Time: 7829-5621 PT Time Calculation (min): 19 min  PT Assessment / Plan / Recommendation Clinical Impression  Patient is a 61 yo male admitted with STEMI.  Patient is independent with mobility and gait.  No acute PT needs - PT will sign off.  Recommend patient participate with Cardiac Rehab for education and mobility.    PT Assessment  Patent does not need any further PT services    Follow Up Recommendations  No PT follow up;Supervision - Intermittent    Does the patient have the potential to tolerate intense rehabilitation      Barriers to Discharge        Equipment Recommendations  None recommended by PT    Recommendations for Other Services  (Cardiac Rehab)   Frequency      Precautions / Restrictions Precautions Precautions: None Restrictions Weight Bearing Restrictions: No   Pertinent Vitals/Pain       Mobility  Bed Mobility Bed Mobility: Supine to Sit;Sitting - Scoot to Edge of Bed Supine to Sit: 6: Modified independent (Device/Increase time);With rails;HOB flat Sitting - Scoot to Edge of Bed: 7: Independent Details for Bed Mobility Assistance: No cues or assist needed Transfers Transfers: Sit to Stand;Stand to Sit Sit to Stand: 7: Independent;From bed Stand to Sit: 7: Independent;With upper extremity assist;With armrests;To chair/3-in-1 Details for Transfer Assistance: No cues or assist needed Ambulation/Gait Ambulation/Gait Assistance: 5: Supervision Ambulation Distance (Feet): 350 Feet Assistive device: None Ambulation/Gait Assistance Details: Patient with good gait pattern and balance.  Encouraged patient to slow gait speed to minimize increase in HR at this time. Gait Pattern: Within Functional Limits Gait velocity: WFL      PT Goals  N/A  Visit Information  Last PT Received On: 01/13/12 Assistance Needed: +1    Subjective  Data  Subjective: "I usually do things fast"  Patient encouraged to slow down during mobility. Patient Stated Goal: To go home.   Prior Functioning  Home Living Lives With: Spouse;Daughter Available Help at Discharge: Family;Available PRN/intermittently (Wife and 2 daughters work) Type of Home: Mobile home Home Access: Stairs to enter Secretary/administrator of Steps: 3 Entrance Stairs-Rails: Right;Left Home Layout: One level Bathroom Shower/Tub: Network engineer: None Prior Function Level of Independence: Independent Able to Take Stairs?: Reciprically Driving: Yes Vocation: Full time employment Comments: Scientist, product/process development Communication: No difficulties    Cognition  Overall Cognitive Status: Appears within functional limits for tasks assessed/performed Arousal/Alertness: Awake/alert Orientation Level: Appears intact for tasks assessed Behavior During Session: [facility] for tasks performed    Extremity/Trunk Assessment Right Upper Extremity Assessment RUE ROM/Strength/Tone: WFL for tasks assessed RUE Sensation: WFL - Light Touch Left Upper Extremity Assessment LUE ROM/Strength/Tone: WFL for tasks assessed LUE Sensation: WFL - Light Touch Right Lower Extremity Assessment RLE ROM/Strength/Tone: WFL for tasks assessed RLE Sensation: WFL - Light Touch Left Lower Extremity Assessment LLE ROM/Strength/Tone: WFL for tasks assessed LLE Sensation: WFL - Light Touch Trunk Assessment Trunk Assessment: Normal   Balance Balance Balance Assessed: Yes High Level Balance High Level Balance Activites: Backward walking;Direction changes;Turns;Sudden stops;Head turns (Stepping over and around obstacles) High Level Balance Comments: No loss of balance with high level activities.   Scored 21/21 on modified DGI (did not do stairs).  End of Session PT - End of Session Equipment Utilized During Treatment: Gait belt Activity  Tolerance: Patient tolerated treatment well Patient left: in chair;with call bell/phone  within reach;with family/visitor present Nurse Communication: Mobility status  GP     Vena Austria 01/13/2012, 5:05 PM Durenda Hurt. Renaldo Fiddler, [facility] Acute Rehab Services Pager [phone]

## 2012-01-13 NOTE — Progress Notes (Signed)
Nurse walks in on patient vomiting. Nurse noticed that the patient could not get a dose of Zofran for nausea. Patient articulated to the nurse that he suffers from severe acid reflux and that Zofran does nothing for him. Patient informed the nurse that when he was on a pepcid drip he had no gastric occurences. Wanted to know if he could get something for his reflux. Nurse paged DR. J. Aitsebaomo. J. Aisebaomo returned the call and prescribed Protonix 40 mg. Nurse administered this to the patient. Franklin Marks   Mr.

## 2012-01-13 NOTE — Care Management Note (Signed)
    Page 1 of 1   01/13/2012     10:16:36 AM   CARE MANAGEMENT NOTE 01/13/2012  Patient:  Franklin Marks, Franklin Marks   Account Number:  [id number]  Date Initiated:  01/13/2012  Documentation initiated by:  Junius Creamer  Subjective/Objective Assessment:   adm w vent fib     Action/Plan:   lives w wife, no pcp listed   Anticipated DC Date:     Anticipated DC Plan:        DC Planning Services  CM consult      Choice offered to / List presented to:             Status of service:   Medicare Important Message given?   (If response is "NO", the following Medicare IM given date fields will be blank) Date Medicare IM given:   Date Additional Medicare IM given:    Discharge Disposition:    Per UR Regulation:  Reviewed for med. necessity/level of care/duration of stay  If discussed at Long Length of Stay Meetings, dates discussed:    Comments:  10/28 10:10a debbie Ayaana Biondo rn,bsn [phone] can fill some meds at disch. left pt 2 prescription assist cards may assist w brnad name meds. left pt community list of clinics he may ck on if he wants to establish pcp.

## 2012-01-13 NOTE — Consult Note (Signed)
ELECTROPHYSIOLOGY CONSULT NOTE  Patient ID: Franklin Marks, MRN: [id number], DOB/AGE: Jun 15, 1950 61 y.o. Admit date: 01/10/2012 Date of Consult: 01/13/2012  Primary Physician: Sheila Oats, MD Primary Cardiologist: [facility] Chief Complaint: cardiac arrest   HPI Franklin Marks is a 61 y.o. male  surived episode of VF assoc with chest pain and ST depression witnessed in ER  ECG showed AF and ST depression  Underwent catheterization acutely and was found to have a patent LIMA and patent vein graft to the diagonal patent vein graft to the RCA with distal disease and occluded vein graft to the second diagonal associated with thrombus. Somewhat inexplicitly, it is described similarly at cath report 12/11 at [facility]. Troponins were elevated to 13.  He has been noncompliant with medications ascribes to coughs. He is a smoker and has continued to smoke.  He denies exercise intolerance shortness of breath or edema. He had tachycardia palpitations prior to his presentation in the emergency room and has had this previously. He was found to be in atrial fibrillation  Thromboembolic risk factors are notable for congestive heart failure and vascular disease. He is also described as having hypoglycemia.  This morning he had nausea vomiting associated chest pain   Past Medical History  Diagnosis Date  . CAD (coronary artery disease)     s/p CABG 2001  . COPD (chronic obstructive pulmonary disease)     ongoing tobacco use  . GERD (gastroesophageal reflux disease)   . Anemia   . Noncompliance with medication regimen   . NSTEMI (non-ST elevated myocardial infarction) 01/10/2012    Associated with ventricular fibrillation  . Ischemic cardiomyopathy 01/10/2012  . Atrial fibrillation 01/13/2012      Surgical History: History reviewed. No pertinent past surgical history.   Home Meds: Prior to Admission medications   Medication Sig Start Date End Date Taking? Authorizing Provider  aspirin 325 MG  tablet Take 325 mg by mouth daily.   Yes Historical Provider, MD    Inpatient Medications:     . aspirin  324 mg Oral Once  . aspirin  81 mg Oral Daily  . carvedilol  3.125 mg Oral BID WC  . furosemide  40 mg Oral Daily  . heparin  2,000 Units Intravenous Once  . influenza  inactive virus vaccine  0.5 mL Intramuscular Tomorrow-1000  . lisinopril  2.5 mg Oral Daily  . potassium chloride  10 mEq Intravenous Q1 Hr x 3  . potassium chloride  40 mEq Oral Once  . potassium chloride  40 mEq Oral TID  . DISCONTD: famotidine (PEPCID) IV  20 mg Intravenous Q12H  . DISCONTD: furosemide  40 mg Intravenous Daily  . DISCONTD: insulin aspart  0-9 Units Subcutaneous Q4H    Allergies:  Allergies  Allergen Reactions  . Codeine Nausea And Vomiting    History   Social History  . Marital Status: Married    Spouse Name: N/A    Number of Children: N/A  . Years of Education: N/A   Occupational History  . Not on file.   Social History Main Topics  . Smoking status: Current Every Day Smoker -- 1.0 packs/day for 45 years    Types: Cigarettes  . Smokeless tobacco: Not on file  . Alcohol Use: Not on file  . Drug Use: Not on file  . Sexually Active: Not on file   Other Topics Concern  . Not on file   Social History Narrative   MarriedWorks as Production designer, theatre/television/film of [employer]  History reviewed. No pertinent family history.   ROS:  Please see the history of present illness.   All other systems reviewed and negative.    Physical Exam:  Blood pressure 133/80, pulse 64, temperature 98 F (36.7 C), temperature source Oral, resp. rate 18, height 5\' 8"  (1.727 m), weight 157 lb 3 oz (71.3 kg), SpO2 95.00%. General: Well developed, well nourished male in no acute distress. Head: Normocephalic, atraumatic, sclera non-icteric, no xanthomas, nares are without discharge. Lymph Nodes:  none Back: without scoliosis/kyphosis, no CVA tendersness Neck: Negative for carotid bruits. JVD not elevated. Lungs:  Clear bilaterally to auscultation without wheezes, rales, or rhonchi. Breathing is unlabored. Heart: RRR with S1 S2 /6  murmur , rubs, or gallops appreciated. Abdomen: Soft, non-tender, non-distended with normoactive bowel sounds. No hepatomegaly. No rebound/guarding. No obvious abdominal masses. Msk:  Strength and tone appear normal for age. Extremities: No clubbing or cyanosis. No  edema.  Distal pedal pulses are 2+ and equal bilaterally. Skin: Warm and Dry Neuro: Alert and oriented X 3. CN III-XII intact Grossly normal sensory and motor function . Psych:  Responds to questions appropriately with a normal affect.      Labs: Cardiac Enzymes  Basename 01/11/12 0448 01/11/12 0023 01/10/12 1840  CKTOTAL -- -- --  CKMB -- -- --  TROPONINI 13.57* 9.01* 5.17*   CBC Lab Results  Component Value Date   WBC 12.0* 01/12/2012   HGB 13.6 01/12/2012   HCT 39.1 01/12/2012   MCV 91.1 01/12/2012   PLT 129* 01/12/2012   PROTIME:  Basename 01/11/12 0448 01/10/12 1734  LABPROT 14.1 15.9*  INR 1.10 1.30   Chemistry   Lab 01/13/12 0550 01/12/12 0630  NA 134* --  K 4.2 --  CL 100 --  CO2 25 --  BUN 15 --  CREATININE 0.82 --  CALCIUM 9.0 --  PROT -- 6.7  BILITOT -- 1.0  ALKPHOS -- 81  ALT -- 117*  AST -- 145*  GLUCOSE 112* --   Lipids Lab Results  Component Value Date   CHOL 134 01/12/2012   HDL 41 01/12/2012   LDLCALC 68 01/12/2012   TRIG 127 01/12/2012   BNP Pro B Natriuretic peptide (BNP)  Date/Time Value Range Status  01/12/2012  6:30 AM 5523.0* 0 - 125 pg/mL Final  01/11/2012  4:44 AM 819.3* 0 - 125 pg/mL Final   Miscellaneous Lab Results  Component Value Date   DDIMER 3.82* 01/10/2012    Radiology/Studies:  Dg Chest Port 1 View  01/12/2012  *RADIOLOGY REPORT*  Clinical Data: Pulmonary infiltrates  PORTABLE CHEST - 1 VIEW  Comparison:   the previous day's study  Findings: The patient has been extubated and the nasogastric tube removed.  The femoral Swan-Ganz  catheter has been retracted. Changes of CABG noted.  Improved aeration with significant resolution of the bibasilar atelectasis or infiltrates seen previously.  No effusion.  No pneumothorax.  Postop changes in the right clavicle are incidentally noted.  Heart size is normal.  IMPRESSION:  1.  Interval extubation with improved aeration especially in the lung bases.   Original Report Authenticated By: Osa Craver, M.D.    Portable Chest Xray In Am  01/11/2012  *RADIOLOGY REPORT*  Clinical Data: Shortness of breath, intubated  PORTABLE CHEST - 1 VIEW  Comparison:   the previous day's study  Findings: Endotracheal tube tip 3.2 cm above carina.  Nasogastric tube loops in the stomach.  A femoral Swan-Ganz catheter extends into a peripheral  branch of the right pulmonary artery and could be retracted several centimeters.  Mild diffuse interstitial edema or infiltrates largely stable.  No effusion.  Heart size within normal limits for technique.  IMPRESSION:  1.  Stable interstitial edema. 2.  Support hardware placement as above.   Original Report Authenticated By: Osa Craver, M.D.    Dg Chest Port 1 View  01/10/2012  *RADIOLOGY REPORT*  Clinical Data: ET tube, infiltrates  PORTABLE CHEST - 1 VIEW  Comparison: 01/10/2012  Findings: Endotracheal tube terminates 3.5 cm above the carina.  Increased interstitial markings, favored to reflect mild interstitial edema.  No pleural effusion or pneumothorax.  The heart is normal in size. Postsurgical changes related to prior CABG.  Enteric tube looped within the gastric cardia.  Catheter with its tip in the right main pulmonary artery, likely a Swan-Ganz catheter, although via femoral approach.  IMPRESSION: Endotracheal tube terminates 3.5 cm above the carina.  Suspected mild interstitial edema.   Original Report Authenticated By: Charline Bills, M.D.    Dg Chest Port 1 View  01/10/2012  *RADIOLOGY REPORT*  Clinical Data: Possible infarct myocardial   PORTABLE CHEST - 1 VIEW  Comparison: 01/10/2012  Findings: Endotracheal tube has been placed with tip 8.5 cm above the carina.  Vascular pattern normal.  Lungs clear.  IMPRESSION: No acute cardiopulmonary abnormalities.   Original Report Authenticated By: Otilio Carpen, M.D.    Dg Chest Port 1 View  01/10/2012  *RADIOLOGY REPORT*  Clinical Data: Chest pain and respiratory arrest  PORTABLE CHEST - 1 VIEW  Comparison: None  Findings: Previous median sternotomy and CABG procedure. Heart size is normal.  No pleural effusion or edema.  No airspace consolidation.  IMPRESSION:  1.  No acute cardiopulmonary abnormalities.   Original Report Authenticated By: Rosealee Albee, M.D.     EKG: NSR with RBBB     Assessment and Plan:    .hmrpbo Patient Active Hospital Problem List: Ventricular fibrillation (01/10/2012)   NSTEMI (non-ST elevated myocardial infarction) (01/10/2012)    Cardiac arrest (01/10/2012)   CAD (coronary artery disease) (01/10/2012)  * COPD (chronic obstructive pulmonary disease) (01/10/2012)   Tobacco abuse (01/10/2012)   Hyperglycemia (01/12/2012)  bradycardia   Atrial fibrillation (01/13/2012)   VF in setting of chest pain with  ECG >> ST depression without elevation;   Tn>>13.  catheterization showed thrombus in the vein graft to the diagonal. With a positive troponin the best explanation was acute occlusion not withstanding the report of a previous thrombus in this vein graft. Perhaps there was interval recannulization.  EF 25-35.  In the setting of an MI, the likelihood of recurrent VF is thought to be low although we don't have draped in the setting of non-STEMI. His ejection fraction may well improve compliance guideline directed regime and beta blockers, ACE inhibitors as well as Aldactone are all appropriate in addition to antiplatelet therapy.  Bradycardia in the limits of titration of his beta blocker  With his history of atrial fibrillation which  appeared to be acute prior to his presentation by symptoms anyway I would favor DAPT for one month followed by anticoagulation probably with aspirin as a single agent.  Reassessment of left ventricular function in 3 months would be appropriate.  We'll discontinue his heparin, begin mobilization and anticipate discharge next 48 hr    we will measure his hemoglobin A1c in the morning    we will begin him on statin therapy, it is my intention be discharged  on pravastatin     Sherryl Manges

## 2012-01-14 MED ORDER — PANTOPRAZOLE SODIUM 40 MG PO TBEC
40.0000 mg | DELAYED_RELEASE_TABLET | Freq: Every day | ORAL | Status: DC
  Administered 2012-01-14: 40 mg via ORAL
  Filled 2012-01-14: qty 1

## 2012-01-14 MED ORDER — MAGNESIUM HYDROXIDE 400 MG/5ML PO SUSP
30.0000 mL | Freq: Once | ORAL | Status: AC
  Administered 2012-01-14: 30 mL via ORAL
  Filled 2012-01-14: qty 30

## 2012-01-14 NOTE — Progress Notes (Signed)
01/14/2012 11:06 AM Nursing note Pt. bp noted 90/53 HR 59. Dr. Graciela Husbands paged and made aware. Orders received and enacted. Pt. Updated on plan. Will continue to closely monitor patient.  Doretta Remmert, Blanchard Kelch

## 2012-01-14 NOTE — Progress Notes (Signed)
CARDIAC REHAB PHASE I   PRE:  Rate/Rhythm: 84SR  BP:  Supine:   Sitting:   Standing: 120/80   SaO2:   MODE:  Ambulation: 550 ft   POST:  Rate/Rhythem: 79SR  BP:  Supine:   Sitting: 110/70  Standing:    SaO2: 98%RA 1400-1424 Pt walked 550 ft on RA with steady gait. Tolerated well. Denied CP. Discussed smoking cessation and handouts given. Pt states he is going to use the patches and that wife has purchased them already. Stated he did not feel like he needed one here. Left MI booklet and heart healthy diet. Will begin ed tomorrow.  Franklin Marks

## 2012-01-14 NOTE — Progress Notes (Signed)
Patient Name: Franklin Marks      SUBJECTIVE: stillw nausea and some vomiting but this while subacute is getting b etter  It turns out he was not on PPI and felt better following this No BM  Cp or SOB  Past Medical History  Diagnosis Date  . CAD (coronary artery disease)     s/p CABG 2001  . COPD (chronic obstructive pulmonary disease)     ongoing tobacco use  . GERD (gastroesophageal reflux disease)   . Anemia   . Noncompliance with medication regimen   . NSTEMI (non-ST elevated myocardial infarction) 01/10/2012    Associated with ventricular fibrillation  . Ischemic cardiomyopathy 01/10/2012  . Atrial fibrillation 01/13/2012    PHYSICAL EXAM Filed Vitals:   01/13/12 1737 01/13/12 2002 01/14/12 0358 01/14/12 0500  BP: 151/90 113/68 113/77 102/64  Pulse: 60 56 64 62  Temp: 97.6 F (36.4 C) 97.4 F (36.3 C) 97.9 F (36.6 C) 98.1 F (36.7 C)  TempSrc:  Oral Oral Oral  Resp:  18 18 18   Height:      Weight:      SpO2: 100% 98% 98% 99%    Well developed and nourished in no acute distress HENT normal Neck supple with JVP-flat Carotids brisk and full without bruits Clear Regular rate and rhythm, no murmurs or gallops Abd-soft with active BS without hepatomegaly or RUQ tenderness No Clubbing cyanosis edema Skin-warm and dry A & Oriented  Grossly normal sensory and motor function  TELEMETRY: Reviewed telemetry pt in nsr    Intake/Output Summary (Last 24 hours) at 01/14/12 0734 Last data filed at 01/13/12 1900  Gross per 24 hour  Intake   25.5 ml  Output    250 ml  Net -224.5 ml    LABS: Basic Metabolic Panel:  Lab 01/13/12 0454 01/12/12 0630 01/11/12 0448 01/10/12 1841 01/10/12 1756 01/10/12 1734  NA 134* 134* 145 139 142 141  K 4.2 3.3* 3.5 3.8 2.9* 2.8*  CL 100 99 107 102 110 102  CO2 25 23 27 24  -- 11*  GLUCOSE 112* 108* 127* 329* 216* 187*  BUN 15 12 13 12 12 12   CREATININE 0.82 0.87 1.07 1.04 1.00 1.15  CALCIUM 9.0 8.6 -- -- -- --  MG -- 1.9 2.2 --  -- --  PHOS -- 2.1* 3.1 -- -- --   Cardiac Enzymes: No results found for this basename: CKTOTAL:3,CKMB:3,CKMBINDEX:3,TROPONINI:3 in the last 72 hours CBC:  Lab 01/12/12 0630 01/11/12 0448 01/10/12 1756 01/10/12 1734  WBC 12.0* 14.8* -- 12.2*  NEUTROABS -- -- -- --  HGB 13.6 14.3 16.3 15.8  HCT 39.1 40.5 48.0 46.9  MCV 91.1 90.6 -- 95.9  PLT 129* 184 -- 198   PROTIME: No results found for this basename: LABPROT:3,INR:3 in the last 72 hours Liver Function Tests:  Basename 01/12/12 0630  AST 145*  ALT 117*  ALKPHOS 81  BILITOT 1.0  PROT 6.7  ALBUMIN 3.3*   No results found for this basename: LIPASE:2,AMYLASE:2 in the last 72 hours BNP: BNP (last 3 results)  Basename 01/12/12 0630 01/11/12 0444  PROBNP 5523.0* 819.3*   D-Dimer: No results found for this basename: DDIMER:2 in the last 72 hours Hemoglobin A1C:  Basename 01/12/12 0630  HGBA1C 5.7*   Fasting Lipid Panel:  Basename 01/12/12 0630  CHOL 134  HDL 41  LDLCALC 68  TRIG 127  CHOLHDL 3.3  LDLDIRECT --     ASSESSMENT AND PLAN:  Patient Active Hospital Problem List: Ventricular  fibrillation (01/10/2012)   NSTEMI (non-ST elevated myocardial infarction) (01/10/2012)   Cardiac arrest (01/10/2012)     COPD (chronic obstructive pulmonary disease) (01/10/2012)   Tobacco abuse (01/10/2012)   Hyperglycemia (01/12/2012)     Atrial fibrillation (01/13/2012)    Pt on Guideline directed medical therapy post MI  Will continue and plan to reassess LV function in about a month Anticoagulants as previous;  HgbA1c looks better than I feared coninue PPI Stopped smoking on patches MOM   Signed, Sherryl Manges MD  01/14/2012

## 2012-01-15 ENCOUNTER — Telehealth: Payer: Self-pay | Admitting: Pharmacist

## 2012-01-15 ENCOUNTER — Encounter (HOSPITAL_COMMUNITY): Payer: Self-pay | Admitting: Physician Assistant

## 2012-01-15 MED ORDER — NITROGLYCERIN 0.4 MG SL SUBL: 0.4000 mg | SUBLINGUAL_TABLET | SUBLINGUAL | Status: DC | PRN

## 2012-01-15 MED ORDER — CARVEDILOL 3.125 MG PO TABS: 3.1250 mg | ORAL_TABLET | Freq: Two times a day (BID) | ORAL | Status: DC

## 2012-01-15 MED ORDER — PANTOPRAZOLE SODIUM 40 MG PO TBEC: 40.0000 mg | DELAYED_RELEASE_TABLET | Freq: Every day | ORAL | Status: DC

## 2012-01-15 MED ORDER — CLOPIDOGREL BISULFATE 75 MG PO TABS: 75.0000 mg | ORAL_TABLET | Freq: Every day | ORAL | Status: DC

## 2012-01-15 MED ORDER — LISINOPRIL 2.5 MG PO TABS: 2.5000 mg | ORAL_TABLET | Freq: Every day | ORAL | Status: DC

## 2012-01-15 MED ORDER — PRAVASTATIN SODIUM 40 MG PO TABS: 40.0000 mg | ORAL_TABLET | Freq: Every day | ORAL | Status: DC

## 2012-01-15 MED ORDER — NICOTINE 7 MG/24HR TD PT24: 1.0000 | MEDICATED_PATCH | TRANSDERMAL | Status: DC

## 2012-01-15 MED ORDER — ASPIRIN 81 MG PO CHEW: 81.0000 mg | CHEWABLE_TABLET | Freq: Every day | ORAL | Status: DC

## 2012-01-15 MED ORDER — SPIRONOLACTONE 12.5 MG HALF TABLET: 12.5000 mg | ORAL_TABLET | Freq: Every day | ORAL | Status: DC

## 2012-01-15 MED ORDER — FUROSEMIDE 40 MG PO TABS: 40.0000 mg | ORAL_TABLET | Freq: Every day | ORAL | Status: DC

## 2012-01-15 MED ORDER — LACTULOSE 10 GM/15ML PO SOLN
30.0000 g | Freq: Once | ORAL | Status: AC
  Administered 2012-01-15: 20 g via ORAL
  Filled 2012-01-15: qty 45

## 2012-01-15 MED ORDER — NICOTINE 14 MG/24HR TD PT24: 1.0000 | MEDICATED_PATCH | TRANSDERMAL | Status: AC

## 2012-01-15 NOTE — Progress Notes (Signed)
Patient Name: Franklin Marks      SUBJECTIVE: s No BM  Cp or SOB Nausea is improved  Past Medical History  Diagnosis Date  . CAD (coronary artery disease)     s/p CABG 2001  . COPD (chronic obstructive pulmonary disease)     ongoing tobacco use  . GERD (gastroesophageal reflux disease)   . Anemia   . Noncompliance with medication regimen   . NSTEMI (non-ST elevated myocardial infarction) 01/10/2012    Associated with ventricular fibrillation  . Ischemic cardiomyopathy 01/10/2012  . Atrial fibrillation 01/13/2012    PHYSICAL EXAM Filed Vitals:   01/14/12 1806 01/14/12 1940 01/15/12 0548 01/15/12 0746  BP:  112/67 100/68   Pulse: 70 54 53 52  Temp:  98.3 F (36.8 C) 97.8 F (36.6 C)   TempSrc:  Oral Oral   Resp:  18 18   Height:      Weight:      SpO2:  98% 93%     Well developed and nourished in no acute distress HENT normal Neck supple with JVP-flat Carotids brisk and full without bruits Clear Regular rate and rhythm, no murmurs or gallops Abd-soft with active BS without hepatomegaly or RUQ tenderness No Clubbing cyanosis edema Skin-warm and dry A & Oriented  Grossly normal sensory and motor function  TELEMETRY: Reviewed telemetry pt in nsr    Intake/Output Summary (Last 24 hours) at 01/15/12 1347 Last data filed at 01/14/12 1700  Gross per 24 hour  Intake    360 ml  Output      0 ml  Net    360 ml    LABS: Basic Metabolic Panel:  Lab 01/13/12 0454 01/12/12 0630 01/11/12 0448 01/10/12 1841 01/10/12 1756 01/10/12 1734  NA 134* 134* 145 139 142 141  K 4.2 3.3* 3.5 3.8 2.9* 2.8*  CL 100 99 107 102 110 102  CO2 25 23 27 24  -- 11*  GLUCOSE 112* 108* 127* 329* 216* 187*  BUN 15 12 13 12 12 12   CREATININE 0.82 0.87 1.07 1.04 1.00 1.15  CALCIUM 9.0 8.6 -- -- -- --  MG -- 1.9 2.2 -- -- --  PHOS -- 2.1* 3.1 -- -- --   Cardiac Enzymes: No results found for this basename: CKTOTAL:3,CKMB:3,CKMBINDEX:3,TROPONINI:3 in the last 72 hours CBC:  Lab 01/12/12  0630 01/11/12 0448 01/10/12 1756 01/10/12 1734  WBC 12.0* 14.8* -- 12.2*  NEUTROABS -- -- -- --  HGB 13.6 14.3 16.3 15.8  HCT 39.1 40.5 48.0 46.9  MCV 91.1 90.6 -- 95.9  PLT 129* 184 -- 198   PROTIME: No results found for this basename: LABPROT:3,INR:3 in the last 72 hours Liver Function Tests: No results found for this basename: AST:2,ALT:2,ALKPHOS:2,BILITOT:2,PROT:2,ALBUMIN:2 in the last 72 hours No results found for this basename: LIPASE:2,AMYLASE:2 in the last 72 hours BNP: BNP (last 3 results)  Basename 01/12/12 0630 01/11/12 0444  PROBNP 5523.0* 819.3*   D-Dimer: No results found for this basename: DDIMER:2 in the last 72 hours Hemoglobin A1C: No results found for this basename: HGBA1C in the last 72 hours Fasting Lipid Panel: No results found for this basename: CHOL,HDL,LDLCALC,TRIG,CHOLHDL,LDLDIRECT in the last 72 hours   ASSESSMENT AND PLAN:  Patient Active Hospital Problem List: Ventricular fibrillation (01/10/2012)   NSTEMI (non-ST elevated myocardial infarction) (01/10/2012)   Cardiac arrest (01/10/2012)     COPD (chronic obstructive pulmonary disease) (01/10/2012)   Tobacco abuse (01/10/2012)   Hyperglycemia (01/12/2012)     Atrial fibrillation (01/13/2012)  Pt on Guideline directed medical therapy post MI  Will continue and plan to reassess LV function in about a month Anticoagulants as previous;  HgbA1c looks better than I feared He had some nocturnal bradycardia we'll continue him on low-dose Coreg  coninue PPI Stop  smoking on patches We will give him lactulose today. Was able to have a BNP will be able to be discharged. In addition to his current medications he would need to go home on #1 nicotine patches as well as lozenges #2 followup 2-3 weeks with Lorin Picket and 6-8 weeks with me.  #3 he will need a metabolic profile at one week and then again when he see Scott to follow potassium while on Aldactone. He'll also need one when he sees me  in   Signed, Sherryl Manges MD  01/15/2012

## 2012-01-15 NOTE — Telephone Encounter (Signed)
Message copied by Velda Shell on Wed Jan 15, 2012  5:16 PM ------      Message from: Roswell Miners B      Created: Wed Jan 15, 2012  3:48 PM      Regarding: 7 day Transition of Care Patient       I have scheduled the patient to be seen on 01/22/12 with Norma Fredrickson for Dr. Graciela Husbands per Alinda Money PA.

## 2012-01-15 NOTE — Discharge Summary (Signed)
Discharge Summary   Patient ID: Franklin Marks,  MRN: [id number], DOB/AGE: 1950/07/21 61 y.o.  Admit date: 01/10/2012 Discharge date: 01/15/2012  Primary Physician: Sheila Oats, MD Primary Cardiologist: Berton Mount, MD; seen in consultation by Dr. Gala Romney  Discharge Diagnoses Principal Problem:  *Ventricular fibrillation Active Problems:  NSTEMI (non-ST elevated myocardial infarction)  Cardiac arrest  CAD (coronary artery disease)  COPD (chronic obstructive pulmonary disease)  Tobacco abuse  Hyperglycemia  Atrial fibrillation   Allergies Allergies  Allergen Reactions  . Codeine Nausea And Vomiting    Diagnostic Studies/Procedures  PORTABLE CHEST X-RAY- 01/10/12  Comparison: None  Findings: Previous median sternotomy and CABG procedure. Heart size  is normal. No pleural effusion or edema. No airspace  consolidation.  IMPRESSION:  1. No acute cardiopulmonary abnormalities.  PORTABLE CHEST X-RAY- 01/10/12  PORTABLE CHEST - 1 VIEW  Comparison: 01/10/2012  Findings: Endotracheal tube has been placed with tip 8.5 cm above  the carina. Vascular pattern normal. Lungs clear.  IMPRESSION:  No acute cardiopulmonary abnormalities.   CARDIAC CATHETERIZATION - 01/10/12  Ao:145/94  LV: 145/23/32  LM: nl  LAD: occluded proximally  Lcx: 30% proximal  RCA: occluded ostially post conus  LIMA to LAD: patent, 80% stenosis in LAD after anastomosis  SVG to Dx2: old occlusion with thrombus (was apparently present in 02/2010 cath at [facility])  SVG to DX: patent  SVG to RCA: patent but with diffuse distal native RCA beyond insertion with 80-90% post PDA and in PLA  Swan: RA mean 15, a 19  RV: 56/14  PA: 56/34  PC: 33  PORTABLE CHEST X-RAY- 01/10/12  Comparison: 01/10/2012  Findings: Endotracheal tube terminates 3.5 cm above the carina.  Increased interstitial markings, favored to reflect mild  interstitial edema. No pleural effusion or pneumothorax.  The heart  is normal in size. Postsurgical changes related to prior  CABG.  Enteric tube looped within the gastric cardia.  Catheter with its tip in the right main pulmonary artery, likely a  Swan-Ganz catheter, although via femoral approach.  IMPRESSION:  Endotracheal tube terminates 3.5 cm above the carina.  Suspected mild interstitial edema.  PORTABLE CHEST X-RAY- 01/11/12  Comparison: the previous day's study  Findings: Endotracheal tube tip 3.2 cm above carina. Nasogastric  tube loops in the stomach. A femoral Swan-Ganz catheter extends  into a peripheral branch of the right pulmonary artery and could be  retracted several centimeters. Mild diffuse interstitial edema or  infiltrates largely stable. No effusion. Heart size within normal  limits for technique.  IMPRESSION:  1. Stable interstitial edema.  2. Support hardware placement as above.  PORTABLE CHEST X-RAY- 01/12/12  Comparison: the previous day's study  Findings: The patient has been extubated and the nasogastric tube  removed. The femoral Swan-Ganz catheter has been retracted.  Changes of CABG noted. Improved aeration with significant  resolution of the bibasilar atelectasis or infiltrates seen  previously. No effusion. No pneumothorax. Postop changes in the  right clavicle are incidentally noted. Heart size is normal.  IMPRESSION:  1. Interval extubation with improved aeration especially in the  lung bases.  2D ECHO- 01/12/12  Study Conclusions  - Left ventricle: The cavity size was normal. There was mild concentric hypertrophy. Systolic function was moderately reduced. The estimated ejection fraction was in the range of 35% to 40%. Moderate to severe hypokinesis of the basal-mid anteroseptal myocardium. - Aortic valve: Mildly to moderately calcified annulus. Trileaflet. - Mitral valve: Calcified annulus. - Right ventricle: The cavity size was normal. Wall thickness  was borderline increased. - Atrial septum: No  defect or patent foramen ovale was identified.  History of Present Illness  Franklin Marks is a 61yo male admitted to [facility] hospital on 01/10/12 for the above problem list. He has a history of ongoing tobacco abuse, anemia, GERD, COPD and CAD (s/p PCI & CABG x 4) who developed chest pain the afternoon of admission lasting for several hours. He called EMS and presented to the ED. There, he went into VF arrest. CPR x 1 min performed. Given epi and cardioverted with ROSC. ECG revealed AF with RBBB. No ST changes. Amio gtt started. He was sedated and intubated. Successive CXRs pre-cath as above revealed no acute abnormality. BP remained labile and levophed was started. Trop-I returned mildly elevated, then increased to 5.17. He was started on heparin and transferred to the cath lab urgently. As above, this revealed prox LAD occlusion, ostial RCA occlusion, 30% prox LCx stenosis, LIMA-LAD: 80% post-anastamosis lesion, SVG-Dx2: old occlusion w/ thrombus (noted on prior 2011 cath at [facility]), SVG-Dx: patent, SVG-RCA: patent, diffuse irregs, 80-90% PDA/PLA lesions patent. No culprit lesions identified or intervened upon.   Hospital Course   He was admitted to the ICU. Further trop-I trend revealed increase to ~ 14. Repeat EKG revealed no Qs or ST changes. SVG-Dx2 thrombus hypothesized as the culprit. As above, he did develop mild pulmonary edema and was gently diuresed. Heparin was continued. Low-dose ACEi, BB and aldactone were started while the patient was still pressor-dependent. Follow-up CXR revealed improved pulmonary edema. He was extubated successfully and weaned off pressors. Heparin was discontinued. A 2D echo was ordered as above revealing LVEF 35-40%, mild conc hypertrophy, servere HK of anteroseptum, borderline RVH. EP was consulted for consideration of ICD. The recommendation was made to continue optimizing heart failure medication regimen with plans to reassess LVEF in ~ 1 month for consideration of ICD.  Regarding anticoagulation for A-fib, DAPT- ASA/Plavix x 1 month, then ASA as a single agent was recommended given issues with compliance in the past. Statin therapy was started. He ambulated over the next 48 hours without incident. A1C returned at 5.7. He was transferred to telemetry and remained stable.   He was assessed by Dr. Graciela Husbands and found to be stable for discharge. Case management has seen, and provided with resources for brand name med affordability and clinics for PCP follow-up. He will be discharged on the medications below. He will continue DAPT- ASA/Plavix x 1 month, then transition to likely ASA alone. This will be followed and determined as an outpatient. He will follow-up in </= 7 days given his high-complexity/NSTEMI status. He will have a follow-up echo in 1 month, and see Dr. Graciela Husbands shortly after. Renal function and electrolytes will be assessed on follow-up given the recent initiation of ACEi and spironolactone. This information, including post-cath instructions, has been clearly outlined in the discharge AVS.   Discharge Vitals:  Blood pressure 95/58, pulse 75, temperature 98.3 F (36.8 C), temperature source Oral, resp. rate 18, height 5\' 8"  (1.727 m), weight 71.3 kg (157 lb 3 oz), SpO2 95.00%.   Labs:  Lab 01/13/12 0550 01/12/12 0630 01/11/12 0448  NA 134* 134* 145  K 4.2 3.3* 3.5  CL 100 99 107  CO2 25 23 27   BUN 15 12 13   CREATININE 0.82 0.87 1.07  CALCIUM 9.0 8.6 7.9*  PROT -- 6.7 --  BILITOT -- 1.0 --  ALKPHOS -- 81 --  ALT -- 117* --  AST -- 145* --  AMYLASE -- -- --  LIPASE -- -- --  GLUCOSE 112* 108* 127*   Disposition:  Discharge Orders    Future Appointments: Provider: Department: Dept Phone: Center:   01/22/2012 9:00 AM Rosalio Macadamia, NP [facility] [phone] [facility]   01/22/2012 10:50 AM [facility] [phone] [facility]   02/17/2012 9:30 AM [facility] [phone] None    02/27/2012 11:30 AM Duke Salvia, MD [facility] [phone] [facility]   02/27/2012 11:45 AM [facility] [phone] [facility]     Future Orders Please Complete By Expires   Amb Referral to Cardiac Rehabilitation      Comments:   Referring to [facility] Phase 2     Follow-up Information    Follow up with Norma Fredrickson, NP. On 01/22/2012. (At 9:00 AM for follow-up and labwork. )    Contact information:   [address] [place] [address] [phone]       Follow up with [facility]. On 02/17/2012. (At 9:30 AM for echocardiogram. )    Contact information:   [address] [place] [address]       Follow up with Sherryl Manges, MD. On 02/27/2012. (At 11:30 AM for follow-up and labwork. )    Contact information:   [address] [place] [address] [phone]         Discharge Medications:    Medication List     As of 01/15/2012  3:52 PM    START taking these medications         aspirin 81 MG chewable tablet   Chew 1 tablet (81 mg total) by mouth daily.   Replaces: aspirin 325 MG tablet      carvedilol 3.125 MG tablet   Commonly known as: COREG   Take 1 tablet (3.125 mg total) by mouth 2 (two) times daily with a meal.      clopidogrel 75 MG tablet   Commonly known as: PLAVIX   Take 1 tablet (75 mg total) by mouth daily with breakfast.      furosemide 40 MG tablet   Commonly known as: LASIX   Take 1 tablet (40 mg total) by mouth daily.      lisinopril 2.5 MG tablet   Commonly known as: PRINIVIL,ZESTRIL   Take 1 tablet (2.5 mg total) by mouth daily.      * nicotine 14 mg/24hr patch   Commonly known as: NICODERM CQ - dosed in mg/24 hours   Place 1 patch onto the skin daily.      * nicotine 7 mg/24hr patch   Commonly known as: NICODERM CQ - dosed in mg/24 hr   Place 1 patch onto the skin daily.   Start taking on: 02/13/2012      pantoprazole 40 MG tablet    Commonly known as: PROTONIX   Take 1 tablet (40 mg total) by mouth at bedtime.      pravastatin 40 MG tablet   Commonly known as: PRAVACHOL   Take 1 tablet (40 mg total) by mouth daily.      spironolactone 12.5 mg Tabs   Commonly known as: ALDACTONE   Take 0.5 tablets (12.5 mg total) by mouth daily.     * Notice: This list has 2 medication(s) that are the same as other medications prescribed for you. Read the directions carefully, and ask your doctor or other care provider to review them with you.  STOP taking these medications         aspirin 325 MG tablet   Replaced by: aspirin 81 MG chewable tablet          Where to get your medications    These are the prescriptions that you need to pick up.   You may get these medications from any pharmacy.         carvedilol 3.125 MG tablet   clopidogrel 75 MG tablet   furosemide 40 MG tablet   lisinopril 2.5 MG tablet   nicotine 14 mg/24hr patch   nicotine 7 mg/24hr patch   pantoprazole 40 MG tablet   pravastatin 40 MG tablet   spironolactone 12.5 mg Tabs         Information on where to get these meds is not yet available. Ask your nurse or doctor.         aspirin 81 MG chewable tablet           Outstanding Labs/Studies: BMET 11/6; 12/12. Echo 12/2  Duration of Discharge Encounter: Greater than 30 minutes including physician time.  Signed, R. Hurman Horn, PA-C 01/15/2012, 3:52 PM

## 2012-01-15 NOTE — Progress Notes (Signed)
CARDIAC REHAB PHASE I   PRE:  Rate/Rhythm: 68SR  BP:  Supine:   Sitting: 90/58  Standing:    SaO2: 97%RA  MODE:  Ambulation: 800 ft   POST:  Rate/Rhythem: 60SR  BP:  Supine:   Sitting: 100/76  Standing:    SaO2: 100%RA 0925-1015 Pt walked 800 ft with steady gait. No CP. Education completed with pt and wife. Permission given to refer to [facility] Phase 2. Gave CHF packet also due to low EF and reviewed zones and when to call MD. Encouraged pt to watch sodium.   Franklin Marks

## 2012-01-16 ENCOUNTER — Telehealth: Payer: Self-pay | Admitting: *Deleted

## 2012-01-16 ENCOUNTER — Encounter: Payer: Self-pay | Admitting: *Deleted

## 2012-01-16 NOTE — Telephone Encounter (Signed)
Reviewed all meds, pt did not have nitro, he states he will call pharmacy and find out why he does not have. Pt was given our number and address and told to call with any further questions or concerns, pt agreed to plan.

## 2012-01-16 NOTE — Telephone Encounter (Signed)
Pt feeling well, pt currently being driven to pharmacy to get medications. i reviewed all upcoming appointments and pt was able to repeat back dates and times. I will call pt back later so we can review new meds together, pt agreeable to plan.

## 2012-01-16 NOTE — Telephone Encounter (Signed)
Done, see note

## 2012-01-17 ENCOUNTER — Other Ambulatory Visit: Payer: Self-pay | Admitting: *Deleted

## 2012-01-17 DIAGNOSIS — I214 Non-ST elevation (NSTEMI) myocardial infarction: Secondary | ICD-10-CM

## 2012-01-17 DIAGNOSIS — I4901 Ventricular fibrillation: Secondary | ICD-10-CM

## 2012-01-22 ENCOUNTER — Encounter: Payer: Self-pay | Admitting: Nurse Practitioner

## 2012-01-22 ENCOUNTER — Ambulatory Visit (INDEPENDENT_AMBULATORY_CARE_PROVIDER_SITE_OTHER): Payer: PRIVATE HEALTH INSURANCE | Admitting: Nurse Practitioner

## 2012-01-22 ENCOUNTER — Other Ambulatory Visit (INDEPENDENT_AMBULATORY_CARE_PROVIDER_SITE_OTHER): Payer: PRIVATE HEALTH INSURANCE

## 2012-01-22 VITALS — BP 110/64 | HR 60 | Ht 70.0 in | Wt 152.4 lb

## 2012-01-22 DIAGNOSIS — I214 Non-ST elevation (NSTEMI) myocardial infarction: Secondary | ICD-10-CM

## 2012-01-22 DIAGNOSIS — I4901 Ventricular fibrillation: Secondary | ICD-10-CM

## 2012-01-22 LAB — BASIC METABOLIC PANEL
Calcium: 9.3 mg/dL (ref 8.4–10.5)
GFR: 79.83 mL/min (ref >60.00)
Glucose, Bld: 86 mg/dL (ref 70–99)
Potassium: 4.5 mEq/L (ref 3.5–5.1)
Sodium: 134 mEq/L — ABNORMAL LOW (ref 135–145)

## 2012-01-22 MED ORDER — CARVEDILOL 6.25 MG PO TABS: 6.2500 mg | ORAL_TABLET | Freq: Two times a day (BID) | ORAL | Status: DC

## 2012-01-22 MED ORDER — NITROGLYCERIN 0.4 MG SL SUBL: 0.4000 mg | SUBLINGUAL_TABLET | SUBLINGUAL | Status: DC | PRN

## 2012-01-22 MED ORDER — FUROSEMIDE 40 MG PO TABS: ORAL_TABLET | ORAL | Status: DC

## 2012-01-22 NOTE — Progress Notes (Signed)
Thalia Party Date of Birth: 23-Feb-1951 Medical Record #[id number]  History of Present Illness: [name] is seen back today for a post hospital visit. He is seen for Dr. Graciela Husbands. He has known CAD with prior PCI and CABG x 4 in 2001. Has COPD, tobacco abuse, anemia hyperglycemia and PAF. He lost his insurance and was basically lost to follow up and was noncompliant as well.    He has had a recent admission for chest pain that developed into a VFIB arrest upon arrival to the ER. Had CPR for one minute and given epi. Was cardioverted and had atrial fib with RBBB. Amiodarone was started. Intubated. Enzymes came back elevated and he was taken to the cath lab. No culprit lesions identified or intervened upon. A thrombus in the SVG to the 2nd DX was hypothesized as the culprit. EF was 35 to 40%. CHF medicines were started.  Recommendation was made to optimize his heart failure medicines and then reassess his EF about 1 month post event. In regards to anticoagulation, he was placed on DAPT with aspirin and Plavix for a month, then to change to aspirin as a single agent. He has had apparent compliance issues in the past. Looks like his cath report wasn't that much different from a prior study at [facility] in 2011.   He is here today. He is here with his wife. He is doing ok. No recurrent chest pain. No swelling reported. Tolerating his medicines. Not dizzy or lightheaded. No syncope. No palpitations. Overall seems to be doing ok. Does have some issues with not sleeping well but this seems to be improving.   Current Outpatient Prescriptions on File Prior to Visit  Medication Sig Dispense Refill  . aspirin 81 MG chewable tablet Chew 1 tablet (81 mg total) by mouth daily.      . clopidogrel (PLAVIX) 75 MG tablet Take 1 tablet (75 mg total) by mouth daily with breakfast.  30 tablet  0  . lisinopril (PRINIVIL,ZESTRIL) 2.5 MG tablet Take 1 tablet (2.5 mg total) by mouth daily.  30 tablet  3  . nicotine (NICODERM CQ)  14 mg/24hr patch Place 1 patch onto the skin daily.  28 patch  0  . nicotine (NICODERM CQ) 7 mg/24hr patch Place 1 patch onto the skin daily.  28 patch  0  . pantoprazole (PROTONIX) 40 MG tablet Take 1 tablet (40 mg total) by mouth at bedtime.  30 tablet  3  . pravastatin (PRAVACHOL) 40 MG tablet Take 1 tablet (40 mg total) by mouth daily.  30 tablet  3  . spironolactone (ALDACTONE) 12.5 mg TABS Take 0.5 tablets (12.5 mg total) by mouth daily.  15 tablet  3  . [DISCONTINUED] furosemide (LASIX) 40 MG tablet Take 1 tablet (40 mg total) by mouth daily.  30 tablet  3  . [DISCONTINUED] nitroGLYCERIN (NITROSTAT) 0.4 MG SL tablet Place 1 tablet (0.4 mg total) under the tongue every 5 (five) minutes as needed for chest pain.  25 tablet  3    Allergies  Allergen Reactions  . Codeine Nausea And Vomiting    Past Medical History  Diagnosis Date  . CAD (coronary artery disease)     s/p CABG 2001  . COPD (chronic obstructive pulmonary disease)     ongoing tobacco use  . GERD (gastroesophageal reflux disease)   . Anemia   . Noncompliance with medication regimen   . NSTEMI (non-ST elevated myocardial infarction) 01/10/2012    Associated with ventricular fibrillation  .  Ischemic cardiomyopathy 01/10/2012    LVEF 35-40%, mild conc hypertrophy, servere HK of anteroseptum, borderline RVH  . Atrial fibrillation 01/13/2012    Past Surgical History  Procedure Date  . Cardiac catheterization 2011    At [facility]  . Cardiac catheterization 12/2011    prox LAD occlusion, ostial RCA occlusion, 30% prox LCx stenosis, LIMA-LAD: 80% post-anastamosis lesion, SVG-Dx2: old occlusion w/ thrombus (noted on prior 2011 cath at [facility]), SVG-Dx: patent, SVG-RCA: patent, diffuse irregs, 80-90% PDA/PLA lesions patent  . Coronary artery bypass graft 2001    History  Smoking status  . Former Smoker -- 1.0 packs/day for 45 years  . Types: Cigarettes  . Quit date: 01/10/2012  Smokeless tobacco  . Not on file     History  Alcohol Use No    History reviewed. No pertinent family history.  Review of Systems: The review of systems is per the HPI.  All other systems were reviewed and are negative.  Physical Exam: BP 110/64  Pulse 60  Ht 5\' 10"  (1.778 m)  Wt 152 lb 6.4 oz (69.128 kg)  BMI 21.87 kg/m2  LABORATORY DATA: BMET is pending for today.   EKG today shows sinus rhythm, R BBB.  Echo Study Conclusions  - Left ventricle: The cavity size was normal. There was mild concentric hypertrophy. Systolic function was moderately reduced. The estimated ejection fraction was in the range of 35% to 40%. Moderate to severe hypokinesis of the basal-mid anteroseptal myocardium. - Aortic valve: Mildly to moderately calcified annulus. Trileaflet. - Mitral valve: Calcified annulus. - Right ventricle: The cavity size was normal. Wall thickness was borderline increased. - Atrial septum: No defect or patent foramen ovale was identified.  CARDIAC CATH IMPRESSION:   1. Ischemic cardiomyopathy with ejection fraction of approximately 25%  with focal akinesis of the anterolateral wall and severe inferior  hypokinesis.  2. Severe native coronary artery disease with total occlusion of the  proximal left anterior descending, 20% to 30% circumflex stenosis  in the bypass circumflex, and total occlusion of the right coronary  artery at its origin.  3. Patent left internal mammary artery graft to the left anterior  descending, but with 80% stenosis in the left anterior descending  beyond the graft insertion.  4. Old occlusion of a clot laden vein graft which had supplied the  second diagonal vessel.  5. Patent vein graft supplying the first diagonal vessel with mild 20%  narrowing.  6. Patent vein graft supplying the right coronary artery with native  distal RCA diffuse disease with 80% to 90% stenosis distally after  the PDA takeoff and 80% to 90% in the PLA vessel.  ______________________________   Nicki Guadalajara, M.D.  TK/MEDQ D: 01/10/2012 T: 01/11/2012 Job: [id number]  Lab Results  Component Value Date   WBC 12.0* 01/12/2012   HGB 13.6 01/12/2012   HCT 39.1 01/12/2012   PLT 129* 01/12/2012   GLUCOSE 112* 01/13/2012   CHOL 134 01/12/2012   TRIG 127 01/12/2012   HDL 41 01/12/2012   LDLCALC 68 01/12/2012   ALT 117* 01/12/2012   AST 145* 01/12/2012   NA 134* 01/13/2012   K 4.2 01/13/2012   CL 100 01/13/2012   CREATININE 0.82 01/13/2012   BUN 15 01/13/2012   CO2 25 01/13/2012   INR 1.10 01/11/2012   HGBA1C 5.7* 01/12/2012   Lab Results  Component Value Date   TROPONINI 13.57* 01/11/2012    Assessment / Plan: 1. Recent NSTEMI with VFib arrest - no recurrent chest  pain reported. On Coreg. On Plavix/Aspirin. The culprit was felt to be this thrombus in the SVG to the 2nd DX. He is to have another echo next month with possible ICD implant to follow.   2. LV systolic heart failure - I have changed his Lasix to just as needed. Increase the Coreg today to 6.25 mg BID. Check BMET today. Hope to increase the ACE on return visit if labs look ok.   3. Tobacco abuse - he has stopped smoking. Still using the patches.   I will see him back in about 7 to 10 days to titrate medicines further. Check BMET today. We reviewed the need for salt restriction and daily weights. He does have scales at home.   Patient is agreeable to this plan and will call if any problems develop in the interim.

## 2012-01-22 NOTE — Patient Instructions (Addendum)
Congratulations for not smoking.   We are going to make the following medicine changes -    Take the lasix only as needed (for a weight gain of 3 pounds overnight, swelling in your legs, more shortness of breath)    Increase the Coreg to 6.25 mg two times a day. You can use 2 of your 3.125 mg tablets two times a day and use those up. The prescription for the   6.25's is at the drug store.   You have a refill on your NTG at the drugstore.  Weigh yourself each morning and record.  Limit sodium intake. Goal is to have less than 2000 mg (2gm) of salt per day.  I need to see you in 7 to 10 days  We will check labs today  Call the [facility] office at [phone] if you have any questions, problems or concerns.

## 2012-01-31 ENCOUNTER — Encounter: Payer: Self-pay | Admitting: Nurse Practitioner

## 2012-01-31 ENCOUNTER — Ambulatory Visit (INDEPENDENT_AMBULATORY_CARE_PROVIDER_SITE_OTHER): Payer: PRIVATE HEALTH INSURANCE | Admitting: Nurse Practitioner

## 2012-01-31 VITALS — BP 110/64 | HR 48 | Ht 71.0 in | Wt 161.2 lb

## 2012-01-31 DIAGNOSIS — E785 Hyperlipidemia, unspecified: Secondary | ICD-10-CM

## 2012-01-31 DIAGNOSIS — I5022 Chronic systolic (congestive) heart failure: Secondary | ICD-10-CM

## 2012-01-31 LAB — HEPATIC FUNCTION PANEL
ALT: 24 U/L (ref 0–53)
AST: 20 U/L (ref 0–37)
Albumin: 3.9 g/dL (ref 3.5–5.2)
Alkaline Phosphatase: 90 U/L (ref 39–117)
Bilirubin, Direct: 0 mg/dL (ref 0.0–0.3)
Total Bilirubin: 0.4 mg/dL (ref 0.3–1.2)
Total Protein: 7.2 g/dL (ref 6.0–8.3)

## 2012-01-31 LAB — BASIC METABOLIC PANEL
BUN: 14 mg/dL (ref 6–23)
CO2: 28 mEq/L (ref 19–32)
Calcium: 9.1 mg/dL (ref 8.4–10.5)
Chloride: 104 mEq/L (ref 96–112)
Creatinine, Ser: 0.9 mg/dL (ref 0.4–1.5)
GFR: 87.8 mL/min (ref >60.00)
Glucose, Bld: 109 mg/dL — ABNORMAL HIGH (ref 70–99)
Potassium: 4.6 mEq/L (ref 3.5–5.1)
Sodium: 136 mEq/L (ref 135–145)

## 2012-01-31 MED ORDER — LISINOPRIL 5 MG PO TABS: 5.0000 mg | ORAL_TABLET | Freq: Every day | ORAL | Status: DC

## 2012-01-31 NOTE — Patient Instructions (Addendum)
We need to check lab today (BMET and HPF)  Stay on your current medicines but with the following changes -     Take your Lasix today, tomorrow and Sunday. Then take the Lasix as needed (weight gain of 2 pounds over night)   Increase the Lisinopril to 5 mg a day. You can take two of your 2.5 mg tablets and use those up. The prescription for the 5 mg is at the drug store.                 Avoid salt  Weight each morning  Call the [facility] office at [phone] if you have any questions, problems or concerns.

## 2012-01-31 NOTE — Progress Notes (Signed)
Thalia Party Date of Birth: Feb 20, 1951 Medical Record #[id number]  History of Present Illness: Franklin Marks is seen back today for a one week check. He is seen for Dr. Graciela Husbands. He has known CAD with prior PCI and CABG x 4 in 2001. Has COPD, tobacco abuse, anemia, hyperglycermia and PAF. He lost his insurance and was basically lost to follow up and was noncompliant as well. He had a recent admission with a VFIB arrest with CPF for one minute and EPI. Was cardioverted and had atrial fib with RBBB. Amiodarone was started but was not continued. Cath showed no culprit lesions. The culprit was felt to be thrombus in the SVG to the 2nd DX. EF was 35 to 40%. CHF medicines were started. Recommendation was to optimize his heart failure medicines and then reassess his EF about 1 month post event. His cath report did not appear to be much different from a prior study at [facility] in 2011. He is on DAPT with aspirin and Plavix for a month and will then change to aspirin as a single agent. Apparently had compliance issues in the past.   I saw him a week ago and increased his Coreg.   He comes back today. He is here alone. Says he is doing ok. Weight is up and he noticed this at home over the past few days. Did not take any diuretic as he has been instructed. Says he is avoiding salt. No chest pain. Not smoking. Not dizzy or lightheaded. Tolerating his medicines.   Current Outpatient Prescriptions on File Prior to Visit  Medication Sig Dispense Refill  . aspirin 81 MG chewable tablet Chew 1 tablet (81 mg total) by mouth daily.      . carvedilol (COREG) 6.25 MG tablet Take 1 tablet (6.25 mg total) by mouth 2 (two) times daily.  180 tablet  3  . clopidogrel (PLAVIX) 75 MG tablet Take 1 tablet (75 mg total) by mouth daily with breakfast.  30 tablet  0  . nicotine (NICODERM CQ) 14 mg/24hr patch Place 1 patch onto the skin daily.  28 patch  0  . nicotine (NICODERM CQ) 7 mg/24hr patch Place 1 patch onto the skin daily.  28 patch  0   . nitroGLYCERIN (NITROSTAT) 0.4 MG SL tablet Place 1 tablet (0.4 mg total) under the tongue every 5 (five) minutes as needed for chest pain.  25 tablet  3  . pantoprazole (PROTONIX) 40 MG tablet Take 1 tablet (40 mg total) by mouth at bedtime.  30 tablet  3  . pravastatin (PRAVACHOL) 40 MG tablet Take 1 tablet (40 mg total) by mouth daily.  30 tablet  3  . spironolactone (ALDACTONE) 12.5 mg TABS Take 0.5 tablets (12.5 mg total) by mouth daily.  15 tablet  3  . furosemide (LASIX) 40 MG tablet For weight gain or swelling  30 tablet  3    Allergies  Allergen Reactions  . Codeine Nausea And Vomiting    Past Medical History  Diagnosis Date  . CAD (coronary artery disease)     s/p CABG 2001  . COPD (chronic obstructive pulmonary disease)     ongoing tobacco use  . GERD (gastroesophageal reflux disease)   . Anemia   . Noncompliance with medication regimen   . NSTEMI (non-ST elevated myocardial infarction) 01/10/2012    Associated with ventricular fibrillation  . Ischemic cardiomyopathy 01/10/2012    LVEF 35-40%, mild conc hypertrophy, servere HK of anteroseptum, borderline RVH  . Atrial fibrillation 01/13/2012  Past Surgical History  Procedure Date  . Cardiac catheterization 2011    At [facility]  . Cardiac catheterization 12/2011    prox LAD occlusion, ostial RCA occlusion, 30% prox LCx stenosis, LIMA-LAD: 80% post-anastamosis lesion, SVG-Dx2: old occlusion w/ thrombus (noted on prior 2011 cath at [facility]), SVG-Dx: patent, SVG-RCA: patent, diffuse irregs, 80-90% PDA/PLA lesions patent  . Coronary artery bypass graft 2001    History  Smoking status  . Former Smoker -- 1.0 packs/day for 45 years  . Types: Cigarettes  . Quit date: 01/10/2012  Smokeless tobacco  . Not on file    History  Alcohol Use No    No family history on file.  Review of Systems: The review of systems is per the HPI.  All other systems were reviewed and are negative.  Physical Exam: BP 110/64  Pulse  48  Ht 5\' 11"  (1.803 m)  Wt 161 lb 3.2 oz (73.12 kg)  BMI 22.48 kg/m2 Weight is up almost 6 pounds.  Patient is pleasant and in no acute distress. Skin is warm and dry. Color is normal.  HEENT is unremarkable except for very poor dentition. Normocephalic/atraumatic. PERRL. Sclera are nonicteric. Neck is supple. No masses. No JVD. Lungs are fairly clear. Cardiac exam shows a regular rate and rhythm. His rate is slow. Abdomen is soft. Extremities are without edema. Gait and ROM are intact. No gross neurologic deficits noted.  LABORATORY DATA: Pending for today.   Lab Results  Component Value Date   WBC 12.0* 01/12/2012   HGB 13.6 01/12/2012   HCT 39.1 01/12/2012   PLT 129* 01/12/2012   GLUCOSE 86 01/22/2012   CHOL 134 01/12/2012   TRIG 127 01/12/2012   HDL 41 01/12/2012   LDLCALC 68 01/12/2012   ALT 117* 01/12/2012   AST 145* 01/12/2012   NA 134* 01/22/2012   K 4.5 01/22/2012   CL 98 01/22/2012   CREATININE 1.0 01/22/2012   BUN 19 01/22/2012   CO2 27 01/22/2012   INR 1.10 01/11/2012   HGBA1C 5.7* 01/12/2012     Assessment / Plan: 1. Systolic heart failure - weight is up. I have asked him to take his Lasix today, tomorrow and on Sunday. I reminded him of the importance of weighing and taking the lasix for a weight gain of 2 pounds overnight. Avoid salt. I do not think he will be able to tolerate any more Coreg given his low heart rate. Lisinopril is increased today. He has follow up in about 3 weeks with Dr. Graciela Husbands with a repeat echo.  2. Recent NSTEMI with VFib arrest - no recurrent chest pain. Culprit was felt to be this thrombus in the SVG to the 2nd DX. Remains on aspirin and Plavix.   3. Increased LFT's - will recheck today.   Patient is agreeable to this plan and will call if any problems develop in the interim.

## 2012-02-11 ENCOUNTER — Other Ambulatory Visit (HOSPITAL_COMMUNITY): Payer: Self-pay | Admitting: Internal Medicine

## 2012-02-11 ENCOUNTER — Other Ambulatory Visit: Payer: Self-pay | Admitting: Nurse Practitioner

## 2012-02-11 DIAGNOSIS — I509 Heart failure, unspecified: Secondary | ICD-10-CM

## 2012-02-11 MED ORDER — PANTOPRAZOLE SODIUM 40 MG PO TBEC: 40.0000 mg | DELAYED_RELEASE_TABLET | Freq: Every day | ORAL | Status: DC

## 2012-02-11 MED ORDER — PRAVASTATIN SODIUM 40 MG PO TABS: 40.0000 mg | ORAL_TABLET | Freq: Every day | ORAL | Status: DC

## 2012-02-11 MED ORDER — CLOPIDOGREL BISULFATE 75 MG PO TABS: 75.0000 mg | ORAL_TABLET | Freq: Every day | ORAL | Status: DC

## 2012-02-11 MED ORDER — SPIRONOLACTONE 12.5 MG HALF TABLET: 12.5000 mg | ORAL_TABLET | Freq: Every day | ORAL | Status: DC

## 2012-02-17 ENCOUNTER — Ambulatory Visit (HOSPITAL_COMMUNITY): Payer: PRIVATE HEALTH INSURANCE | Attending: Cardiology

## 2012-02-17 DIAGNOSIS — I2510 Atherosclerotic heart disease of native coronary artery without angina pectoris: Secondary | ICD-10-CM | POA: Insufficient documentation

## 2012-02-17 DIAGNOSIS — E785 Hyperlipidemia, unspecified: Secondary | ICD-10-CM | POA: Insufficient documentation

## 2012-02-17 DIAGNOSIS — J4489 Other specified chronic obstructive pulmonary disease: Secondary | ICD-10-CM | POA: Insufficient documentation

## 2012-02-17 DIAGNOSIS — I079 Rheumatic tricuspid valve disease, unspecified: Secondary | ICD-10-CM | POA: Insufficient documentation

## 2012-02-17 DIAGNOSIS — Z87891 Personal history of nicotine dependence: Secondary | ICD-10-CM | POA: Insufficient documentation

## 2012-02-17 DIAGNOSIS — I2589 Other forms of chronic ischemic heart disease: Secondary | ICD-10-CM | POA: Insufficient documentation

## 2012-02-17 DIAGNOSIS — J449 Chronic obstructive pulmonary disease, unspecified: Secondary | ICD-10-CM | POA: Insufficient documentation

## 2012-02-17 DIAGNOSIS — I4891 Unspecified atrial fibrillation: Secondary | ICD-10-CM | POA: Insufficient documentation

## 2012-02-17 DIAGNOSIS — I517 Cardiomegaly: Secondary | ICD-10-CM | POA: Insufficient documentation

## 2012-02-17 DIAGNOSIS — I509 Heart failure, unspecified: Secondary | ICD-10-CM

## 2012-02-17 NOTE — Progress Notes (Signed)
Echocardiogram performed.  

## 2012-02-26 ENCOUNTER — Other Ambulatory Visit: Payer: Self-pay | Admitting: *Deleted

## 2012-02-26 DIAGNOSIS — I509 Heart failure, unspecified: Secondary | ICD-10-CM

## 2012-02-27 ENCOUNTER — Other Ambulatory Visit (INDEPENDENT_AMBULATORY_CARE_PROVIDER_SITE_OTHER): Payer: PRIVATE HEALTH INSURANCE

## 2012-02-27 ENCOUNTER — Encounter: Payer: Self-pay | Admitting: Internal Medicine

## 2012-02-27 ENCOUNTER — Ambulatory Visit (INDEPENDENT_AMBULATORY_CARE_PROVIDER_SITE_OTHER): Payer: PRIVATE HEALTH INSURANCE | Admitting: Internal Medicine

## 2012-02-27 VITALS — BP 98/64 | HR 43 | Resp 18 | Ht 70.0 in | Wt 168.0 lb

## 2012-02-27 DIAGNOSIS — I4891 Unspecified atrial fibrillation: Secondary | ICD-10-CM

## 2012-02-27 DIAGNOSIS — I498 Other specified cardiac arrhythmias: Secondary | ICD-10-CM

## 2012-02-27 DIAGNOSIS — R0989 Other specified symptoms and signs involving the circulatory and respiratory systems: Secondary | ICD-10-CM

## 2012-02-27 DIAGNOSIS — Z720 Tobacco use: Secondary | ICD-10-CM

## 2012-02-27 DIAGNOSIS — F17200 Nicotine dependence, unspecified, uncomplicated: Secondary | ICD-10-CM

## 2012-02-27 DIAGNOSIS — I469 Cardiac arrest, cause unspecified: Secondary | ICD-10-CM

## 2012-02-27 DIAGNOSIS — I214 Non-ST elevation (NSTEMI) myocardial infarction: Secondary | ICD-10-CM

## 2012-02-27 DIAGNOSIS — R001 Bradycardia, unspecified: Secondary | ICD-10-CM

## 2012-02-27 DIAGNOSIS — I509 Heart failure, unspecified: Secondary | ICD-10-CM

## 2012-02-27 MED ORDER — CARVEDILOL 6.25 MG PO TABS: 3.1250 mg | ORAL_TABLET | Freq: Two times a day (BID) | ORAL | Status: DC

## 2012-02-27 MED ORDER — LISINOPRIL 5 MG PO TABS: 2.5000 mg | ORAL_TABLET | Freq: Every day | ORAL | Status: DC

## 2012-02-27 NOTE — Assessment & Plan Note (Signed)
Appears contextually to be in the setting of MI; there has been normalization of left ventricular systolic function

## 2012-02-27 NOTE — Assessment & Plan Note (Signed)
Is not clear whether this is symptomatic or not; we will decrease his carvedilol 6.25-3.125. It for 5 weeks we'll put on the treadmill to assess chronotropic competence and at that point we will need to make a decision, if not, as to whether to stop his beta blocker and/or implant pacing

## 2012-02-27 NOTE — Progress Notes (Signed)
Patient Care Team: Julieanne Manson, MD as PCP - General (Unknown Physician Specialty)   HPI  Franklin Marks is a 61 y.o. male Seen in followup for cardiac arrest occurring in the setting of coronary artery disease and chest pain. Catheterization demonstrated occlusion of the LAD with a high-grade LIMA LAD anastomosis.  Troponins were positive for tumor 13. There is thrombus in vein graft to the diagonal. While there are some depression of left ventricular function EF 25-35%, it was elected to treat him medically with reassessment of left ventricular function 3 months.  Is also noted to have atrial fibrillation. The acute. It was elected to treat him with double anti platelet therapy and subsequent anticoagulation  Repeat echo 12/13  demonstrated normalization of LV function.    He has no edema. No shortness of breath. He is gradually regaining his strength he thinks. Past Medical History  Diagnosis Date  . CAD (coronary artery disease)     s/p CABG 2001  . COPD (chronic obstructive pulmonary disease)     ongoing tobacco use  . GERD (gastroesophageal reflux disease)   . Anemia   . Noncompliance with medication regimen   . NSTEMI (non-ST elevated myocardial infarction) 01/10/2012    Associated with ventricular fibrillation  . Ischemic cardiomyopathy 01/10/2012    LVEF 35-40%, mild conc hypertrophy, servere HK of anteroseptum, borderline RVH  . Atrial fibrillation 01/13/2012    Past Surgical History  Procedure Date  . Cardiac catheterization 2011    At [facility]  . Cardiac catheterization 12/2011    prox LAD occlusion, ostial RCA occlusion, 30% prox LCx stenosis, LIMA-LAD: 80% post-anastamosis lesion, SVG-Dx2: old occlusion w/ thrombus (noted on prior 2011 cath at [facility]), SVG-Dx: patent, SVG-RCA: patent, diffuse irregs, 80-90% PDA/PLA lesions patent  . Coronary artery bypass graft 2001    Current Outpatient Prescriptions  Medication Sig Dispense Refill  . aspirin 81 MG chewable  tablet Chew 1 tablet (81 mg total) by mouth daily.      . carvedilol (COREG) 6.25 MG tablet Take 1 tablet (6.25 mg total) by mouth 2 (two) times daily.  180 tablet  3  . clopidogrel (PLAVIX) 75 MG tablet Take 1 tablet (75 mg total) by mouth daily with breakfast.  30 tablet  5  . furosemide (LASIX) 40 MG tablet For weight gain or swelling  30 tablet  3  . lisinopril (PRINIVIL,ZESTRIL) 5 MG tablet Take 1 tablet (5 mg total) by mouth daily.  30 tablet  3  . nitroGLYCERIN (NITROSTAT) 0.4 MG SL tablet Place 1 tablet (0.4 mg total) under the tongue every 5 (five) minutes as needed for chest pain.  25 tablet  3  . pantoprazole (PROTONIX) 40 MG tablet Take 1 tablet (40 mg total) by mouth at bedtime.  30 tablet  5  . pravastatin (PRAVACHOL) 40 MG tablet Take 1 tablet (40 mg total) by mouth daily.  30 tablet  5  . spironolactone (ALDACTONE) 12.5 mg TABS Take 0.5 tablets (12.5 mg total) by mouth daily.  30 tablet  3    Allergies  Allergen Reactions  . Codeine Nausea And Vomiting    Review of Systems negative except from HPI and PMH  Physical Exam BP 98/64  Pulse 43  Resp 18  Ht 5\' 10"  (1.778 m)  Wt 168 lb (76.204 kg)  BMI 24.11 kg/m2  SpO2 99% Well developed and well nourished in no acute distress HENT normal E scleral and icterus clear Neck Supple JVP flat; carotids brisk and full Clear  to ausculation  Regular rate and rhythm, no murmurs gallops or rub Soft with active bowel sounds No clubbing cyanosis none Edema Alert and oriented, grossly normal motor and sensory function Skin Warm and Dry  ECG demonstrates sinus rhythm at 42 intervals 24/14/49 Axis LXVI Right bundle branch block  Assessment and  Plan

## 2012-02-27 NOTE — Patient Instructions (Addendum)
Decrease Coreg dose to 3.125mg  twice daily.  Decrease Lisinopril dose to 2.5mg  daily.  STOP Aldactone.  Your physician has requested that you have an exercise tolerance test with Dr. Graciela Husbands in [place] around January 14th.  For further information please visit [email or web address] Please also follow instruction sheet, as given.

## 2012-02-27 NOTE — Assessment & Plan Note (Signed)
Merry Christmas.  he has stopped smoking

## 2012-02-27 NOTE — Assessment & Plan Note (Signed)
Holding sinus rhythm. We will need to make a decision regarding anticoagulation down the Road as he had atrial fibrillation.

## 2012-02-27 NOTE — Assessment & Plan Note (Signed)
Continued medical therapy. The duration of antiplatelet therapy appropriate at least a year.

## 2012-04-06 ENCOUNTER — Ambulatory Visit (INDEPENDENT_AMBULATORY_CARE_PROVIDER_SITE_OTHER): Payer: BC Managed Care – PPO | Admitting: Internal Medicine

## 2012-04-06 ENCOUNTER — Encounter: Payer: Self-pay | Admitting: Internal Medicine

## 2012-04-06 VITALS — BP 84/62 | HR 57 | Ht 70.0 in | Wt 168.0 lb

## 2012-04-06 DIAGNOSIS — Z720 Tobacco use: Secondary | ICD-10-CM

## 2012-04-06 DIAGNOSIS — I4891 Unspecified atrial fibrillation: Secondary | ICD-10-CM

## 2012-04-06 DIAGNOSIS — M79606 Pain in leg, unspecified: Secondary | ICD-10-CM

## 2012-04-06 NOTE — Procedures (Signed)
Exercise Treadmill Test  Pre-Exercise Testing Evaluation Rhythm: normal sinus brady  Rate: 48   PR:   QRS:    QT:   QTc:    P axis:   QRS axis:    ST Segments:       Test  Exercise Tolerance Test Ordering MD: Sherryl Manges, MD  Interpreting MD: Sherryl Manges, MD  Unique Test No:    Treadmill:    Indication for ETT:   Contraindication to ETT:    Stress Modality:   Cardiac Imaging Performed:    Protocol:   Max BP:  140/80  Max MPHR (bpm):  158 85% MPR (bpm):  135  MPHR obtained (bpm):  118 % MPHR obtained:  75  Reached 85% MPHR (min:sec):    Total Exercise Time (min-sec):     Workload in METS:    Borg Scale:    Reason ETT Terminated:  fatigue     ST Segment Analysis At Rest: normal ST segments - no evidence of significant ST depression With Exercise: no evidence of significant ST depression  Other Information Arrhythmia:  Yes Angina during ETT:  absent (0) Quality of ETT:  diagnostic  ETT Interpretation:  normal - no evidence of ischemia by ST analysis  Comments:  mild chronotropic incompetence  Recommendations:  continue current meds and do Arterial dopplers

## 2012-04-06 NOTE — Patient Instructions (Addendum)
Treadmill Today looked fine.  Dr. Graciela Husbands recommends exercise at least 15- 20 minutes a day.  You will need lower extremity arterial doppler.

## 2012-04-07 ENCOUNTER — Telehealth: Payer: Self-pay | Admitting: *Deleted

## 2012-04-07 NOTE — Telephone Encounter (Signed)
I reassured pt bloodshot eye should resolve on its own. Denies vision changes. Spontaneous this am. I reassured pt to continue to monitor but should resolve Understanding verb

## 2012-04-07 NOTE — Telephone Encounter (Signed)
Pt calling states that he woke up this morning with a busted blood vessel in his eye. Wants to know if he should be concerned

## 2012-04-09 ENCOUNTER — Encounter (INDEPENDENT_AMBULATORY_CARE_PROVIDER_SITE_OTHER): Payer: BC Managed Care – PPO

## 2012-04-09 DIAGNOSIS — I70219 Atherosclerosis of native arteries of extremities with intermittent claudication, unspecified extremity: Secondary | ICD-10-CM

## 2012-04-09 DIAGNOSIS — M79606 Pain in leg, unspecified: Secondary | ICD-10-CM

## 2012-04-09 DIAGNOSIS — I739 Peripheral vascular disease, unspecified: Secondary | ICD-10-CM

## 2012-04-09 DIAGNOSIS — Z720 Tobacco use: Secondary | ICD-10-CM

## 2012-04-28 ENCOUNTER — Ambulatory Visit (INDEPENDENT_AMBULATORY_CARE_PROVIDER_SITE_OTHER): Payer: BC Managed Care – PPO | Admitting: Cardiovascular Disease

## 2012-04-28 ENCOUNTER — Encounter: Payer: Self-pay | Admitting: Cardiovascular Disease

## 2012-04-28 VITALS — BP 98/64 | HR 53 | Ht 71.0 in | Wt 177.2 lb

## 2012-04-28 DIAGNOSIS — R0602 Shortness of breath: Secondary | ICD-10-CM

## 2012-04-28 DIAGNOSIS — I739 Peripheral vascular disease, unspecified: Secondary | ICD-10-CM | POA: Insufficient documentation

## 2012-04-28 DIAGNOSIS — I4891 Unspecified atrial fibrillation: Secondary | ICD-10-CM

## 2012-04-28 NOTE — Assessment & Plan Note (Signed)
Mr. Youtz has bilateral calf claudication which is mild at this point and not lifestyle limiting. Arterial duplex showed evidence of chronic occlusion of both SFAs. He quit smoking last year which is extremely important in preventing further progression of peripheral arterial disease. He is already on good medical therapy. Given that his symptoms are mild, I recommend continued observation and a walking exercise program in order to improve his walking distance. There is no indication for revascularization at this time. I will continue to follow him on a yearly basis.

## 2012-04-28 NOTE — Progress Notes (Signed)
HPI  This is a 62 year old man who was referred by Dr. Graciela Husbands for evaluation and management of claudication and peripheral arterial disease. He has known history of cardiac arrest occurring in the setting of coronary artery disease and chest pain. Catheterization demonstrated occlusion of the LAD with a high-grade LIMA LAD anastomosis.  Troponins were positive for tumor 13. There is thrombus in vein graft to the diagonal. While there are some depression of left ventricular function EF 25-35%, it was elected to treat him medically with reassessment of left ventricular function 3 months. Repeat echo 12/13  demonstrated normalization of LV function.   He recently complain of bilateral calf discomfort with walking. He was referred for an ABI of lower extremity arterial duplex which showed mildly reduced ABI bilaterally slightly worse on the right side with evidence of chronic total occlusion of the SFA on both sides with reconstitution distally via collaterals. The patient complains of Discomfort if he walks at a fast pace for more than 15 minutes. The discomfort is usually not severe and that does not force him to stop walking. He is able to walk on a half months at a reasonable pace. At night, he complains of some numbness and occasional cramps. He has no lower extremity ulceration.  Allergies  Allergen Reactions  . Codeine Nausea And Vomiting     Current Outpatient Prescriptions on File Prior to Visit  Medication Sig Dispense Refill  . aspirin 81 MG chewable tablet Chew 1 tablet (81 mg total) by mouth daily.      . carvedilol (COREG) 6.25 MG tablet Take 0.5 tablets (3.125 mg total) by mouth 2 (two) times daily with a meal.  180 tablet  3  . clopidogrel (PLAVIX) 75 MG tablet Take 1 tablet (75 mg total) by mouth daily with breakfast.  30 tablet  5  . lisinopril (PRINIVIL,ZESTRIL) 5 MG tablet Take 0.5 tablets (2.5 mg total) by mouth daily.  30 tablet  3  . nitroGLYCERIN (NITROSTAT) 0.4 MG SL tablet  Place 1 tablet (0.4 mg total) under the tongue every 5 (five) minutes as needed for chest pain.  25 tablet  3  . pantoprazole (PROTONIX) 40 MG tablet Take 1 tablet (40 mg total) by mouth at bedtime.  30 tablet  5  . pravastatin (PRAVACHOL) 40 MG tablet Take 1 tablet (40 mg total) by mouth daily.  30 tablet  5   No current facility-administered medications on file prior to visit.     Past Medical History  Diagnosis Date  . CAD (coronary artery disease)     s/p CABG 2001  . COPD (chronic obstructive pulmonary disease)     ongoing tobacco use  . GERD (gastroesophageal reflux disease)   . Anemia   . Noncompliance with medication regimen   . NSTEMI (non-ST elevated myocardial infarction) 01/10/2012    Associated with ventricular fibrillation  . Ischemic cardiomyopathy 01/10/2012    LVEF 35-40%, mild conc hypertrophy, servere HK of anteroseptum, borderline RVH  . Atrial fibrillation 01/13/2012     Past Surgical History  Procedure Laterality Date  . Cardiac catheterization  2011    At [facility]  . Cardiac catheterization  12/2011    prox LAD occlusion, ostial RCA occlusion, 30% prox LCx stenosis, LIMA-LAD: 80% post-anastamosis lesion, SVG-Dx2: old occlusion w/ thrombus (noted on prior 2011 cath at [facility]), SVG-Dx: patent, SVG-RCA: patent, diffuse irregs, 80-90% PDA/PLA lesions patent  . Coronary artery bypass graft  2001     History reviewed. No pertinent  family history.   History   Social History  . Marital Status: Married    Spouse Name: N/A    Number of Children: N/A  . Years of Education: N/A   Occupational History  . Not on file.   Social History Main Topics  . Smoking status: Former Smoker -- 1.00 packs/day for 45 years    Types: Cigarettes    Quit date: 01/10/2012  . Smokeless tobacco: Not on file  . Alcohol Use: No  . Drug Use: No  . Sexually Active: Not Currently   Other Topics Concern  . Not on file   Social History Narrative   Married   Works as Water quality scientist      PHYSICAL EXAM   BP 98/64  Pulse 53  Ht 5\' 11"  (1.803 m)  Wt 177 lb 4 oz (80.4 kg)  BMI 24.73 kg/m2 Constitutional: He is oriented to person, place, and time. He appears well-developed and well-nourished. No distress.  HENT: No nasal discharge.  Head: Normocephalic and atraumatic.  Eyes: Pupils are equal and round. Right eye exhibits no discharge. Left eye exhibits no discharge.  Neck: Normal range of motion. Neck supple. No JVD present. No thyromegaly present.  Cardiovascular: Normal rate, regular rhythm, normal heart sounds and. Exam reveals no gallop and no friction rub. No murmur heard.  Pulmonary/Chest: Effort normal and breath sounds normal. No stridor. No respiratory distress. He has no wheezes. He has no rales. He exhibits no tenderness.  Abdominal: Soft. Bowel sounds are normal. He exhibits no distension. There is no tenderness. There is no rebound and no guarding.  Musculoskeletal: Normal range of motion. He exhibits no edema and no tenderness.  Neurological: He is alert and oriented to person, place, and time. Coordination normal.  Skin: Skin is warm and dry. No rash noted. He is not diaphoretic. No erythema. No pallor.  Psychiatric: He has a normal mood and affect. His behavior is normal. Judgment and thought content normal.  Vascular: Femoral pulses are slightly diminished bilaterally. Posterior tibial pulse is palpable on both sides. Dorsalis pedis is absent.   [id number]  Bradycardia  -First degree A-V block  PRi = 238 -Right bundle branch block.   ABNORMAL      ASSESSMENT AND PLAN

## 2012-04-28 NOTE — Patient Instructions (Addendum)
Continue same medications  Follow up in 1 year

## 2012-05-20 ENCOUNTER — Telehealth: Payer: Self-pay

## 2012-05-20 NOTE — Telephone Encounter (Signed)
Pt called and was asking if he should follow up with Dr. Graciela Husbands. He is seeing Dr Kirke Corin for [facility], but was not sure if he needed a follow up with Dr Graciela Husbands. Please advise

## 2012-05-21 ENCOUNTER — Telehealth: Payer: Self-pay | Admitting: Internal Medicine

## 2012-05-21 NOTE — Telephone Encounter (Signed)
No need to f/u with Graciela Husbands

## 2012-06-10 ENCOUNTER — Ambulatory Visit (INDEPENDENT_AMBULATORY_CARE_PROVIDER_SITE_OTHER): Payer: BC Managed Care – PPO | Admitting: Internal Medicine

## 2012-06-10 ENCOUNTER — Encounter: Payer: Self-pay | Admitting: Internal Medicine

## 2012-06-10 VITALS — BP 110/70 | HR 53 | Temp 97.9°F | Ht 69.0 in | Wt 179.0 lb

## 2012-06-10 DIAGNOSIS — Z23 Encounter for immunization: Secondary | ICD-10-CM

## 2012-06-10 DIAGNOSIS — Z125 Encounter for screening for malignant neoplasm of prostate: Secondary | ICD-10-CM

## 2012-06-10 DIAGNOSIS — F438 Other reactions to severe stress: Secondary | ICD-10-CM

## 2012-06-10 DIAGNOSIS — Z0000 Encounter for general adult medical examination without abnormal findings: Secondary | ICD-10-CM

## 2012-06-10 DIAGNOSIS — F4320 Adjustment disorder, unspecified: Secondary | ICD-10-CM | POA: Insufficient documentation

## 2012-06-10 DIAGNOSIS — Z1211 Encounter for screening for malignant neoplasm of colon: Secondary | ICD-10-CM

## 2012-06-10 LAB — PSA: PSA: 0.56 ng/mL (ref 0.10–4.00)

## 2012-06-10 NOTE — Addendum Note (Signed)
Addended by: Sueanne Margarita on: 06/10/2012 12:51 PM   Modules accepted: Orders

## 2012-06-10 NOTE — Assessment & Plan Note (Signed)
Will do PSA after discussion Stool immunoassay Tdap today

## 2012-06-10 NOTE — Progress Notes (Signed)
Subjective:    Patient ID: Franklin Marks, male    DOB: September 26, 1950, 62 y.o.   MRN: [id number]  HPI Here to establish No primary care doctor for some time  Followed by cardiology Had cardiac arrest 2013, CABG 2001 Still has fatigue which limits him Some degree of claudication --notes it at night now Trying to walk bid daily  Current Outpatient Prescriptions on File Prior to Visit  Medication Sig Dispense Refill  . aspirin 81 MG chewable tablet Chew 1 tablet (81 mg total) by mouth daily.      . carvedilol (COREG) 6.25 MG tablet Take 0.5 tablets (3.125 mg total) by mouth 2 (two) times daily with a meal.  180 tablet  3  . clopidogrel (PLAVIX) 75 MG tablet Take 1 tablet (75 mg total) by mouth daily with breakfast.  30 tablet  5  . furosemide (LASIX) 40 MG tablet as needed. For weight gain or swelling      . lisinopril (PRINIVIL,ZESTRIL) 5 MG tablet Take 0.5 tablets (2.5 mg total) by mouth daily.  30 tablet  3  . nitroGLYCERIN (NITROSTAT) 0.4 MG SL tablet Place 1 tablet (0.4 mg total) under the tongue every 5 (five) minutes as needed for chest pain.  25 tablet  3  . pantoprazole (PROTONIX) 40 MG tablet Take 1 tablet (40 mg total) by mouth at bedtime.  30 tablet  5  . pravastatin (PRAVACHOL) 40 MG tablet Take 1 tablet (40 mg total) by mouth daily.  30 tablet  5   No current facility-administered medications on file prior to visit.    Allergies  Allergen Reactions  . Codeine Nausea And Vomiting    Past Medical History  Diagnosis Date  . CAD (coronary artery disease)     s/p CABG 2001  . COPD (chronic obstructive pulmonary disease)     ongoing tobacco use  . GERD (gastroesophageal reflux disease)   . Anemia   . Noncompliance with medication regimen   . NSTEMI (non-ST elevated myocardial infarction) 01/10/2012    Associated with ventricular fibrillation  . Ischemic cardiomyopathy 01/10/2012    LVEF 35-40%, mild conc hypertrophy, servere HK of anteroseptum, borderline RVH  . Atrial  fibrillation 01/13/2012    Past Surgical History  Procedure Laterality Date  . Cardiac catheterization  2011    At [facility]  . Cardiac catheterization  12/2011    prox LAD occlusion, ostial RCA occlusion, 30% prox LCx stenosis, LIMA-LAD: 80% post-anastamosis lesion, SVG-Dx2: old occlusion w/ thrombus (noted on prior 2011 cath at [facility]), SVG-Dx: patent, SVG-RCA: patent, diffuse irregs, 80-90% PDA/PLA lesions patent  . Coronary artery bypass graft  2001    Family History  Problem Relation Age of Onset  . Hyperlipidemia Mother   . Alcohol abuse Father   . Hyperlipidemia Father   . Heart disease Sister   . Hyperlipidemia Sister   . Heart disease Brother   . Hyperlipidemia Brother   . Diabetes Neg Hx   . Hypertension Neg Hx   . Hyperlipidemia Sister     History   Social History  . Marital Status: Married    Spouse Name: N/A    Number of Children: 4  . Years of Education: N/A   Occupational History  . Management --now disabled    Social History Main Topics  . Smoking status: Former Smoker -- 1.00 packs/day for 45 years    Types: Cigarettes    Quit date: 01/10/2012  . Smokeless tobacco: Never Used  . Alcohol Use: No  .  Drug Use: No  . Sexually Active: Not Currently   Other Topics Concern  . Not on file   Social History Narrative   Married twice   3 children from 1st marriage-- 1 from second   Worked as Investment banker, operational   Review of Systems  Constitutional: Negative for fatigue and unexpected weight change.       Wears seat belt  HENT: Positive for dental problem. Negative for hearing loss and tinnitus.        Bad teeth--overdue for dentist  Eyes: Negative for pain, redness and visual disturbance.  Respiratory: Positive for cough and shortness of breath. Negative for chest tightness.        Stable mild DOE Occ cough  Cardiovascular: Negative for chest pain, palpitations and leg swelling.  Gastrointestinal: Negative for nausea, vomiting, abdominal pain,  constipation and blood in stool.       Heartburn controlled on med  Endocrine: Negative for cold intolerance and heat intolerance.  Genitourinary: Positive for difficulty urinating. Negative for urgency.       Increased nocturia, flow is okay No sexual problems---limited since cardiac event  Musculoskeletal: Positive for back pain and arthralgias. Negative for joint swelling.       Knee pain  Skin: Negative for rash.       Has new lesion on back--small mole  Allergic/Immunologic: Negative for environmental allergies and immunocompromised state.  Neurological: Negative for dizziness, syncope, weakness, light-headedness, numbness and headaches.  Hematological: Negative for adenopathy. Does not bruise/bleed easily.  Psychiatric/Behavioral: Positive for sleep disturbance and dysphoric mood. The patient is nervous/anxious.        Trouble maintaining sleep--- up with leg pain, nocturia, stress Out of work since [employer] issues, etc since then Not anhedonic       Objective:   Physical Exam  Constitutional: He is oriented to person, place, and time. He appears well-developed and well-nourished. No distress.  HENT:  Head: Normocephalic and atraumatic.  Right Ear: External ear normal.  Left Ear: External ear normal.  Mouth/Throat: Oropharynx is clear and moist. No oropharyngeal exudate.  Dentition poor  Eyes: Conjunctivae and EOM are normal. Pupils are equal, round, and reactive to light.  Neck: Normal range of motion. Neck supple. No thyromegaly present.  Cardiovascular: Normal rate, regular rhythm and normal heart sounds.  Exam reveals no gallop.   No murmur heard. Pulse on left, none on right  Pulmonary/Chest: Effort normal and breath sounds normal. No respiratory distress. He has no wheezes. He has no rales.  Abdominal: Soft. There is no tenderness.  Musculoskeletal: He exhibits no edema and no tenderness.  Lymphadenopathy:    He has no cervical adenopathy.  Neurological: He is  alert and oriented to person, place, and time.  Skin: No rash noted. No erythema.  Small seb keratosis on back  Psychiatric: He has a normal mood and affect. His behavior is normal.          Assessment & Plan:

## 2012-06-10 NOTE — Assessment & Plan Note (Signed)
His world has been rocked Now not working Not MDD Needs to find purpose Will follow up soon to see how he is doing

## 2012-06-25 ENCOUNTER — Other Ambulatory Visit: Payer: Self-pay | Admitting: *Deleted

## 2012-06-25 MED ORDER — CLOPIDOGREL BISULFATE 75 MG PO TABS: 75.0000 mg | ORAL_TABLET | Freq: Every day | ORAL | Status: DC

## 2012-06-25 NOTE — Telephone Encounter (Signed)
Refilled Carvedilol #90 Refill#5 sent to [facility].

## 2012-07-13 ENCOUNTER — Telehealth: Payer: Self-pay

## 2012-07-13 NOTE — Telephone Encounter (Signed)
I do not see a need to give prophylactic abx, but is it ok to hold Plavix?

## 2012-07-13 NOTE — Telephone Encounter (Signed)
Memoree from Dr Henriette Combs calling wanting to know if pt needs antibiotic prior to dental treatment, and does he need to D/C his Plavix. Please advise.

## 2012-07-14 NOTE — Telephone Encounter (Signed)
Ok to hold Plavix for 5 days. Continue Aspirin 81 mg daily.

## 2012-07-14 NOTE — Telephone Encounter (Signed)
Dr. Henriette Combs office made aware They ask that I fax this to them at [phone]

## 2012-07-14 NOTE — Telephone Encounter (Signed)
No need for antibiotics. He should stay on Plavix if it is not a major dental surgery.

## 2012-07-14 NOTE — Telephone Encounter (Signed)
LMTCB

## 2012-07-14 NOTE — Telephone Encounter (Signed)
Dr. Henriette Combs office aware She tells me this is considered "major dental surgery"/multiple extractions I will ask Dr. Kirke Corin and call her back re: plavix, asks if it can be held x3-5 days prior

## 2012-07-16 ENCOUNTER — Encounter: Payer: Self-pay | Admitting: Internal Medicine

## 2012-07-16 ENCOUNTER — Ambulatory Visit (INDEPENDENT_AMBULATORY_CARE_PROVIDER_SITE_OTHER): Payer: BC Managed Care – PPO | Admitting: Internal Medicine

## 2012-07-16 VITALS — BP 112/71 | HR 62 | Ht 69.0 in | Wt 175.0 lb

## 2012-07-16 DIAGNOSIS — I4891 Unspecified atrial fibrillation: Secondary | ICD-10-CM

## 2012-07-16 DIAGNOSIS — I214 Non-ST elevation (NSTEMI) myocardial infarction: Secondary | ICD-10-CM

## 2012-07-16 DIAGNOSIS — R001 Bradycardia, unspecified: Secondary | ICD-10-CM

## 2012-07-16 DIAGNOSIS — I498 Other specified cardiac arrhythmias: Secondary | ICD-10-CM

## 2012-07-16 NOTE — Assessment & Plan Note (Signed)
No known atrial fibrillation. In the event that it recurs, anticoagulation with a

## 2012-07-16 NOTE — Patient Instructions (Addendum)
Your physician wants you to follow-up in: 12 months with Dr Graciela Husbands. You will receive a reminder letter in the mail two months in advance. If you don't receive a letter, please call our office to schedule the follow-up appointment.  Your physician has recommended you make the following change in your medication: STOP Plavix

## 2012-07-16 NOTE — Progress Notes (Signed)
Patient Care Team: [name], MD as PCP - General (Pediatrics)   HPI  Franklin Marks is a 62 y.o. male Seen in followup for cardiac arrest occurring in the setting of coronary artery disease and chest pain. Catheterization demonstrated occlusion of the LAD with a high-grade LIMA LAD anastomosis. T . There is thrombus in vein graft to the diagonal. While there are some depression of left ventricular function EF 25-35%,  There was subsequent normalization of LV systolic function 12/13  Is also noted to have atrial fibrillation at the time of his MI and cardiac arrest.  It was elected to treat him with double anti platelet therapy  With the intention for one month. He was thought to be atrial fibrillation been associated with the acute event did not necessitate long-term anticoagulation.    He has no edema. No shortness of breath. He has no major complaints. He is continued to successfully be off cigarettes  Past Medical History  Diagnosis Date  . CAD (coronary artery disease)     s/p CABG 2001  . COPD (chronic obstructive pulmonary disease)     ongoing tobacco use  . GERD (gastroesophageal reflux disease)   . Anemia   . Noncompliance with medication regimen   . NSTEMI (non-ST elevated myocardial infarction) 01/10/2012    Associated with ventricular fibrillation  . Ischemic cardiomyopathy 01/10/2012    LVEF 35-40%, mild conc hypertrophy, servere HK of anteroseptum, borderline RVH  . Atrial fibrillation 01/13/2012    Past Surgical History  Procedure Laterality Date  . Cardiac catheterization  2011    At [facility]  . Cardiac catheterization  12/2011    prox LAD occlusion, ostial RCA occlusion, 30% prox LCx stenosis, LIMA-LAD: 80% post-anastamosis lesion, SVG-Dx2: old occlusion w/ thrombus (noted on prior 2011 cath at [facility]), SVG-Dx: patent, SVG-RCA: patent, diffuse irregs, 80-90% PDA/PLA lesions patent  . Coronary artery bypass graft  2001    Current Outpatient Prescriptions   Medication Sig Dispense Refill  . aspirin 81 MG chewable tablet Chew 1 tablet (81 mg total) by mouth daily.      . carvedilol (COREG) 6.25 MG tablet Take 0.5 tablets (3.125 mg total) by mouth 2 (two) times daily with a meal.  180 tablet  3  . clopidogrel (PLAVIX) 75 MG tablet Take 1 tablet (75 mg total) by mouth daily with breakfast.  90 tablet  5  . furosemide (LASIX) 40 MG tablet as needed. For weight gain or swelling      . lisinopril (PRINIVIL,ZESTRIL) 5 MG tablet Take 0.5 tablets (2.5 mg total) by mouth daily.  30 tablet  3  . nitroGLYCERIN (NITROSTAT) 0.4 MG SL tablet Place 1 tablet (0.4 mg total) under the tongue every 5 (five) minutes as needed for chest pain.  25 tablet  3  . pantoprazole (PROTONIX) 40 MG tablet Take 1 tablet (40 mg total) by mouth at bedtime.  30 tablet  5  . pravastatin (PRAVACHOL) 40 MG tablet Take 1 tablet (40 mg total) by mouth daily.  30 tablet  5   No current facility-administered medications for this visit.    Allergies  Allergen Reactions  . Codeine Nausea And Vomiting    Review of Systems negative except from HPI and PMH  Physical Exam BP 112/71  Pulse 62  Ht 5\' 9"  (1.753 m)  Wt 175 lb (79.379 kg)  BMI 25.83 kg/m2 Well developed and well nourished in no acute distress HENT normal E scleral and icterus clear Neck Supple JVP flat;  carotids brisk and full Clear to ausculation  Regular rate and rhythm, no murmurs gallops or rub Soft with active bowel sounds No clubbing cyanosis none Edema Alert and oriented, grossly normal motor and sensory function Skin Warm and Dry    Assessment and  Plan

## 2012-07-16 NOTE — Assessment & Plan Note (Signed)
Precludes betablocker

## 2012-07-16 NOTE — Assessment & Plan Note (Signed)
Continue on aspirin. We will discontinue his Plavix.

## 2012-08-28 ENCOUNTER — Other Ambulatory Visit: Payer: Self-pay | Admitting: *Deleted

## 2012-08-28 MED ORDER — LISINOPRIL 5 MG PO TABS: 2.5000 mg | ORAL_TABLET | Freq: Every day | ORAL | Status: DC

## 2012-08-28 MED ORDER — PRAVASTATIN SODIUM 40 MG PO TABS: 40.0000 mg | ORAL_TABLET | Freq: Every day | ORAL | Status: DC

## 2012-08-28 MED ORDER — CARVEDILOL 6.25 MG PO TABS: 3.1250 mg | ORAL_TABLET | Freq: Two times a day (BID) | ORAL | Status: DC

## 2012-08-28 MED ORDER — PANTOPRAZOLE SODIUM 40 MG PO TBEC: 40.0000 mg | DELAYED_RELEASE_TABLET | Freq: Every day | ORAL | Status: DC

## 2012-08-28 NOTE — Addendum Note (Signed)
Addended by: Sueanne Margarita on: 08/28/2012 04:38 PM   Modules accepted: Orders

## 2012-09-02 ENCOUNTER — Other Ambulatory Visit: Payer: Self-pay | Admitting: *Deleted

## 2012-09-02 MED ORDER — PRAVASTATIN SODIUM 40 MG PO TABS: 40.0000 mg | ORAL_TABLET | Freq: Every day | ORAL | Status: DC

## 2012-09-10 ENCOUNTER — Ambulatory Visit (INDEPENDENT_AMBULATORY_CARE_PROVIDER_SITE_OTHER): Payer: BC Managed Care – PPO | Admitting: Internal Medicine

## 2012-09-10 ENCOUNTER — Encounter: Payer: Self-pay | Admitting: Internal Medicine

## 2012-09-10 ENCOUNTER — Telehealth: Payer: Self-pay

## 2012-09-10 VITALS — BP 120/80 | HR 76 | Temp 98.3°F | Ht 69.0 in | Wt 177.0 lb

## 2012-09-10 DIAGNOSIS — G2581 Restless legs syndrome: Secondary | ICD-10-CM | POA: Insufficient documentation

## 2012-09-10 LAB — CBC WITH DIFFERENTIAL/PLATELET
Basophils Absolute: 0 10*3/uL (ref 0.0–0.1)
Eosinophils Absolute: 0.4 10*3/uL (ref 0.0–0.7)
Lymphocytes Relative: 28.1 % (ref 12.0–46.0)
MCHC: 33.7 g/dL (ref 30.0–36.0)
MCV: 94.5 fl (ref 78.0–100.0)
Monocytes Absolute: 0.6 10*3/uL (ref 0.1–1.0)
Neutrophils Relative %: 59.2 % (ref 43.0–77.0)
Platelets: 170 10*3/uL (ref 150.0–400.0)
RDW: 13.1 % (ref 11.5–14.6)

## 2012-09-10 MED ORDER — CLONAZEPAM 0.5 MG PO TABS: 0.5000 mg | ORAL_TABLET | Freq: Every evening | ORAL | Status: DC | PRN

## 2012-09-10 NOTE — Progress Notes (Signed)
Subjective:    Patient ID: Franklin Marks, male    DOB: 10/22/1950, 62 y.o.   MRN: [id number]  HPI Doing better  Still gets tired when he walks---he is limited Can go around 20 minutes at a slow rate He gets cramps and feels restless/tingling a lot Does keep him up at times---often has to get up twice at night to walk around  Piddles around the house and has small garden [employer] dog in afternoon Tries to help wife out with housework  Current Outpatient Prescriptions on File Prior to Visit  Medication Sig Dispense Refill  . aspirin 81 MG chewable tablet Chew 1 tablet (81 mg total) by mouth daily.      . carvedilol (COREG) 6.25 MG tablet Take 0.5 tablets (3.125 mg total) by mouth 2 (two) times daily with a meal.  90 tablet  3  . furosemide (LASIX) 40 MG tablet as needed. For weight gain or swelling      . lisinopril (PRINIVIL,ZESTRIL) 5 MG tablet Take 0.5 tablets (2.5 mg total) by mouth daily.  45 tablet  1  . nitroGLYCERIN (NITROSTAT) 0.4 MG SL tablet Place 1 tablet (0.4 mg total) under the tongue every 5 (five) minutes as needed for chest pain.  25 tablet  3  . pantoprazole (PROTONIX) 40 MG tablet Take 1 tablet (40 mg total) by mouth at bedtime.  90 tablet  1  . pravastatin (PRAVACHOL) 40 MG tablet Take 1 tablet (40 mg total) by mouth daily.  90 tablet  1   No current facility-administered medications on file prior to visit.    Allergies  Allergen Reactions  . Codeine Nausea And Vomiting    Past Medical History  Diagnosis Date  . CAD (coronary artery disease)     s/p CABG 2001  . COPD (chronic obstructive pulmonary disease)     ongoing tobacco use  . GERD (gastroesophageal reflux disease)   . Anemia   . Noncompliance with medication regimen   . NSTEMI (non-ST elevated myocardial infarction) 01/10/2012    Associated with ventricular fibrillation  . Ischemic cardiomyopathy 01/10/2012    LVEF 35-40%, mild conc hypertrophy, servere HK of anteroseptum, borderline RVH   . Atrial fibrillation 01/13/2012    Past Surgical History  Procedure Laterality Date  . Cardiac catheterization  2011    At [facility]  . Cardiac catheterization  12/2011    prox LAD occlusion, ostial RCA occlusion, 30% prox LCx stenosis, LIMA-LAD: 80% post-anastamosis lesion, SVG-Dx2: old occlusion w/ thrombus (noted on prior 2011 cath at [facility]), SVG-Dx: patent, SVG-RCA: patent, diffuse irregs, 80-90% PDA/PLA lesions patent  . Coronary artery bypass graft  2001    Family History  Problem Relation Age of Onset  . Hyperlipidemia Mother   . Alcohol abuse Father   . Hyperlipidemia Father   . Heart disease Sister   . Hyperlipidemia Sister   . Heart disease Brother   . Hyperlipidemia Brother   . Diabetes Neg Hx   . Hypertension Neg Hx   . Hyperlipidemia Sister     History   Social History  . Marital Status: Married    Spouse Name: N/A    Number of Children: 4  . Years of Education: N/A   Occupational History  . Management --now disabled    Social History Main Topics  . Smoking status: Former Smoker -- 1.00 packs/day for 45 years    Types: Cigarettes    Quit date: 01/10/2012  . Smokeless tobacco: Never Used  . Alcohol  Use: No  . Drug Use: No  . Sexually Active: Not Currently   Other Topics Concern  . Not on file   Social History Narrative   Married twice   3 children from 1st marriage-- 1 from second   Worked as Investment banker, operational   Review of Systems Mild depressed feelings--- some degree of boredom Still waiting to hear about his disability Has noted some mild memory issues    Objective:   Physical Exam  Musculoskeletal:  No calf swelling or tenderness  Psychiatric: He has a normal mood and affect. His behavior is normal.          Assessment & Plan:

## 2012-09-10 NOTE — Assessment & Plan Note (Signed)
Intermittent Will check CBC Clonazepam for prn use

## 2012-09-10 NOTE — Telephone Encounter (Signed)
Request from Disability Determination Services , sent to [employer] on 09/10/2012 .  

## 2012-09-21 ENCOUNTER — Encounter: Payer: Self-pay | Admitting: Radiology

## 2012-09-22 ENCOUNTER — Other Ambulatory Visit (INDEPENDENT_AMBULATORY_CARE_PROVIDER_SITE_OTHER): Payer: BC Managed Care – PPO

## 2012-09-22 DIAGNOSIS — R7989 Other specified abnormal findings of blood chemistry: Secondary | ICD-10-CM

## 2012-09-22 DIAGNOSIS — R6889 Other general symptoms and signs: Secondary | ICD-10-CM

## 2012-09-22 LAB — T3, FREE: T3, Free: 2.5 pg/mL (ref 2.3–4.2)

## 2012-10-07 ENCOUNTER — Encounter: Payer: Self-pay | Admitting: Internal Medicine

## 2013-01-21 ENCOUNTER — Other Ambulatory Visit: Payer: Self-pay

## 2013-02-22 ENCOUNTER — Encounter: Payer: Self-pay | Admitting: Cardiovascular Disease

## 2013-02-22 ENCOUNTER — Ambulatory Visit (INDEPENDENT_AMBULATORY_CARE_PROVIDER_SITE_OTHER): Payer: BC Managed Care – PPO | Admitting: Cardiovascular Disease

## 2013-02-22 VITALS — BP 127/82 | HR 67 | Ht 71.0 in | Wt 182.0 lb

## 2013-02-22 DIAGNOSIS — I2510 Atherosclerotic heart disease of native coronary artery without angina pectoris: Secondary | ICD-10-CM

## 2013-02-22 DIAGNOSIS — I739 Peripheral vascular disease, unspecified: Secondary | ICD-10-CM

## 2013-02-22 DIAGNOSIS — R0602 Shortness of breath: Secondary | ICD-10-CM

## 2013-02-22 NOTE — Progress Notes (Signed)
HPI  This is a 62 year old man who is here today for a followup visit regarding peripheral arterial disease. He has known history of cardiac arrest occurring in the setting of coronary artery disease and chest pain. He is status post CABG. He was seen early this year for bilateral calf discomfort with walking. ABI was mildly reduced bilaterally slightly worse on the right side with evidence of chronic total occlusion of the SFA on both sides with reconstitution distally via collaterals.  I advised him to start an exercise program. He was also recently diagnosed with restless leg syndrome and was started on clonazepam. He reports overall significant improvement in his leg pain. He walks on a regular basis. He is able to walk for more than 30 minutes without reported calf discomfort. He started smoking again 2-3 cigarettes a day. He wants to quit.  Allergies  Allergen Reactions  . Codeine Nausea And Vomiting     Current Outpatient Prescriptions on File Prior to Visit  Medication Sig Dispense Refill  . aspirin 81 MG chewable tablet Chew 1 tablet (81 mg total) by mouth daily.      . carvedilol (COREG) 6.25 MG tablet Take 0.5 tablets (3.125 mg total) by mouth 2 (two) times daily with a meal.  90 tablet  3  . clonazePAM (KLONOPIN) 0.5 MG tablet Take 1 tablet (0.5 mg total) by mouth at bedtime as needed.  30 tablet  0  . furosemide (LASIX) 40 MG tablet as needed. For weight gain or swelling      . lisinopril (PRINIVIL,ZESTRIL) 5 MG tablet Take 0.5 tablets (2.5 mg total) by mouth daily.  45 tablet  1  . nitroGLYCERIN (NITROSTAT) 0.4 MG SL tablet Place 1 tablet (0.4 mg total) under the tongue every 5 (five) minutes as needed for chest pain.  25 tablet  3  . pantoprazole (PROTONIX) 40 MG tablet Take 1 tablet (40 mg total) by mouth at bedtime.  90 tablet  1  . pravastatin (PRAVACHOL) 40 MG tablet Take 1 tablet (40 mg total) by mouth daily.  90 tablet  1   No current facility-administered medications  on file prior to visit.     Past Medical History  Diagnosis Date  . CAD (coronary artery disease)     s/p CABG 2001  . COPD (chronic obstructive pulmonary disease)     ongoing tobacco use  . GERD (gastroesophageal reflux disease)   . Anemia   . Noncompliance with medication regimen   . NSTEMI (non-ST elevated myocardial infarction) 01/10/2012    Associated with ventricular fibrillation  . Ischemic cardiomyopathy 01/10/2012    LVEF 35-40%, mild conc hypertrophy, servere HK of anteroseptum, borderline RVH  . Atrial fibrillation 01/13/2012     Past Surgical History  Procedure Laterality Date  . Cardiac catheterization  2011    At [facility]  . Cardiac catheterization  12/2011    prox LAD occlusion, ostial RCA occlusion, 30% prox LCx stenosis, LIMA-LAD: 80% post-anastamosis lesion, SVG-Dx2: old occlusion w/ thrombus (noted on prior 2011 cath at [facility]), SVG-Dx: patent, SVG-RCA: patent, diffuse irregs, 80-90% PDA/PLA lesions patent  . Coronary artery bypass graft  2001     Family History  Problem Relation Age of Onset  . Hyperlipidemia Mother   . Alcohol abuse Father   . Hyperlipidemia Father   . Heart disease Sister   . Hyperlipidemia Sister   . Heart disease Brother   . Hyperlipidemia Brother   . Diabetes Neg Hx   . Hypertension  Neg Hx   . Hyperlipidemia Sister      History   Social History  . Marital Status: Married    Spouse Name: N/A    Number of Children: 4  . Years of Education: N/A   Occupational History  . Management --now disabled    Social History Main Topics  . Smoking status: Current Every Day Smoker -- 1.00 packs/day for 45 years    Types: Cigarettes    Last Attempt to Quit: 01/10/2012  . Smokeless tobacco: Never Used  . Alcohol Use: Yes  . Drug Use: Yes    Special: Marijuana  . Sexual Activity: Not Currently   Other Topics Concern  . Not on file   Social History Narrative   Married twice   3 children from 1st marriage-- 1 from second    Worked as Investment banker, operational      PHYSICAL EXAM   BP 127/82  Pulse 67  Ht 5\' 11"  (1.803 m)  Wt 182 lb (82.555 kg)  BMI 25.40 kg/m2 Constitutional: He is oriented to person, place, and time. He appears well-developed and well-nourished. No distress.  HENT: No nasal discharge.  Head: Normocephalic and atraumatic.  Eyes: Pupils are equal and round. Right eye exhibits no discharge. Left eye exhibits no discharge.  Neck: Normal range of motion. Neck supple. No JVD present. No thyromegaly present.  Cardiovascular: Normal rate, regular rhythm, normal heart sounds and. Exam reveals no gallop and no friction rub. No murmur heard.  Pulmonary/Chest: Effort normal and breath sounds normal. No stridor. No respiratory distress. He has no wheezes. He has no rales. He exhibits no tenderness.  Abdominal: Soft. Bowel sounds are normal. He exhibits no distension. There is no tenderness. There is no rebound and no guarding.  Musculoskeletal: Normal range of motion. He exhibits no edema and no tenderness.  Neurological: He is alert and oriented to person, place, and time. Coordination normal.  Skin: Skin is warm and dry. No rash noted. He is not diaphoretic. No erythema. No pallor.  Psychiatric: He has a normal mood and affect. His behavior is normal. Judgment and thought content normal.  Vascular: Femoral pulses are slightly diminished bilaterally. Posterior tibial pulse is palpable on both sides. Dorsalis pedis is absent.   [id number]  Bradycardia  -First degree A-V block  PRi = 238 -Right bundle branch block.   ABNORMAL      ASSESSMENT AND PLAN

## 2013-02-22 NOTE — Patient Instructions (Signed)
Continue same medications.   Your physician wants you to follow-up in: 12 months.  You will receive a reminder letter in the mail two months in advance. If you don't receive a letter, please call our office to schedule the follow-up appointment.  

## 2013-02-22 NOTE — Assessment & Plan Note (Signed)
He denies any chest discomfort. Continue medical therapy.

## 2013-02-22 NOTE — Assessment & Plan Note (Addendum)
His claudication is still mild and not lifestyle limiting. Thus, I recommend continuing medical therapy. We can consider adding cilostazol in the future if he becomes more symptomatic. He is no longer on Plavix. Most recent LV systolic function was low normal. Thus, there is no contraindication. He is to followup with me in a yearly basis. I again discussed with him the importance of smoking cessation. He wants to quit again by using nicotine patch.

## 2013-03-03 ENCOUNTER — Ambulatory Visit (INDEPENDENT_AMBULATORY_CARE_PROVIDER_SITE_OTHER): Payer: BC Managed Care – PPO

## 2013-03-03 DIAGNOSIS — Z23 Encounter for immunization: Secondary | ICD-10-CM

## 2013-04-09 ENCOUNTER — Other Ambulatory Visit: Payer: Self-pay | Admitting: *Deleted

## 2013-04-09 MED ORDER — LISINOPRIL 5 MG PO TABS: 2.5000 mg | ORAL_TABLET | Freq: Every day | ORAL | Status: DC

## 2013-04-14 ENCOUNTER — Other Ambulatory Visit: Payer: Self-pay | Admitting: *Deleted

## 2013-04-14 MED ORDER — PRAVASTATIN SODIUM 40 MG PO TABS: 40.0000 mg | ORAL_TABLET | Freq: Every day | ORAL | Status: DC

## 2013-04-19 ENCOUNTER — Other Ambulatory Visit: Payer: Self-pay | Admitting: *Deleted

## 2013-04-19 MED ORDER — PRAVASTATIN SODIUM 40 MG PO TABS
40.0000 mg | ORAL_TABLET | Freq: Every day | ORAL | Status: DC
Start: 2013-04-19 — End: 2013-04-20

## 2013-04-20 ENCOUNTER — Other Ambulatory Visit: Payer: Self-pay | Admitting: *Deleted

## 2013-04-20 MED ORDER — PRAVASTATIN SODIUM 40 MG PO TABS: 40.0000 mg | ORAL_TABLET | Freq: Every day | ORAL | Status: DC

## 2013-04-20 MED ORDER — LISINOPRIL 5 MG PO TABS: 2.5000 mg | ORAL_TABLET | Freq: Every day | ORAL | Status: DC

## 2013-04-22 ENCOUNTER — Telehealth: Payer: Self-pay | Admitting: *Deleted

## 2013-04-22 NOTE — Telephone Encounter (Signed)
[facility] a refill of Plavix 75 mg because pt's mail order haven't came yet. I called pt after checking his chart and this was stopped at his 07/16/12 OV with Dr. Graciela HusbandsKlein. Pt states when he get home he will check to see if he's taking this or not.

## 2013-04-26 NOTE — Telephone Encounter (Signed)
Pt came in with list of meds but pt is not sure what meds he is supposed to be taking. Printed med list and Geraldine ContrasDee said she will talk with pt.

## 2013-04-27 NOTE — Telephone Encounter (Signed)
Spoke with patient and advised that Dr. Graciela HusbandsKlein stopped the medication Plavix.

## 2013-05-31 ENCOUNTER — Other Ambulatory Visit: Payer: Self-pay | Admitting: Internal Medicine

## 2013-05-31 DIAGNOSIS — E785 Hyperlipidemia, unspecified: Secondary | ICD-10-CM

## 2013-06-07 ENCOUNTER — Other Ambulatory Visit (INDEPENDENT_AMBULATORY_CARE_PROVIDER_SITE_OTHER): Payer: BC Managed Care – PPO

## 2013-06-07 DIAGNOSIS — E785 Hyperlipidemia, unspecified: Secondary | ICD-10-CM

## 2013-06-07 LAB — LIPID PANEL
Cholesterol: 151 mg/dL (ref 0–200)
HDL: 30.3 mg/dL — AB (ref >39.00)
LDL CALC: 50 mg/dL (ref 0–99)
TRIGLYCERIDES: 352 mg/dL — AB (ref 0.0–149.0)
Total CHOL/HDL Ratio: 5
VLDL: 70.4 mg/dL — ABNORMAL HIGH (ref 0.0–40.0)

## 2013-06-07 LAB — CBC WITH DIFFERENTIAL/PLATELET
BASOS PCT: 0.4 % (ref 0.0–3.0)
Basophils Absolute: 0 10*3/uL (ref 0.0–0.1)
EOS PCT: 3.3 % (ref 0.0–5.0)
Eosinophils Absolute: 0.3 10*3/uL (ref 0.0–0.7)
HCT: 42.4 % (ref 39.0–52.0)
HEMOGLOBIN: 14.3 g/dL (ref 13.0–17.0)
Lymphocytes Relative: 24 % (ref 12.0–46.0)
Lymphs Abs: 1.9 10*3/uL (ref 0.7–4.0)
MCHC: 33.8 g/dL (ref 30.0–36.0)
MCV: 92.3 fl (ref 78.0–100.0)
MONO ABS: 0.4 10*3/uL (ref 0.1–1.0)
Monocytes Relative: 5.2 % (ref 3.0–12.0)
NEUTROS PCT: 67.1 % (ref 43.0–77.0)
Neutro Abs: 5.3 10*3/uL (ref 1.4–7.7)
Platelets: 160 10*3/uL (ref 150.0–400.0)
RBC: 4.6 Mil/uL (ref 4.22–5.81)
RDW: 13.4 % (ref 11.5–14.6)
WBC: 8 10*3/uL (ref 4.5–10.5)

## 2013-06-07 LAB — COMPREHENSIVE METABOLIC PANEL
ALBUMIN: 4.2 g/dL (ref 3.5–5.2)
ALK PHOS: 80 U/L (ref 39–117)
ALT: 17 U/L (ref 0–53)
AST: 18 U/L (ref 0–37)
BILIRUBIN TOTAL: 0.6 mg/dL (ref 0.3–1.2)
BUN: 14 mg/dL (ref 6–23)
CO2: 29 mEq/L (ref 19–32)
CREATININE: 1 mg/dL (ref 0.4–1.5)
Calcium: 9.4 mg/dL (ref 8.4–10.5)
Chloride: 104 mEq/L (ref 96–112)
GFR: 85.29 mL/min (ref >60.00)
Glucose, Bld: 114 mg/dL — ABNORMAL HIGH (ref 70–99)
POTASSIUM: 4.1 meq/L (ref 3.5–5.1)
Sodium: 137 mEq/L (ref 135–145)
Total Protein: 7.5 g/dL (ref 6.0–8.3)

## 2013-06-07 LAB — TSH: TSH: 2.26 u[IU]/mL (ref 0.35–5.50)

## 2013-06-07 LAB — T4, FREE: Free T4: 0.58 ng/dL — ABNORMAL LOW (ref 0.60–1.60)

## 2013-06-14 ENCOUNTER — Encounter: Payer: Self-pay | Admitting: Internal Medicine

## 2013-06-14 ENCOUNTER — Ambulatory Visit (INDEPENDENT_AMBULATORY_CARE_PROVIDER_SITE_OTHER): Payer: BC Managed Care – PPO | Admitting: Internal Medicine

## 2013-06-14 VITALS — BP 110/62 | HR 57 | Temp 98.2°F | Ht 71.0 in | Wt 180.0 lb

## 2013-06-14 DIAGNOSIS — I2510 Atherosclerotic heart disease of native coronary artery without angina pectoris: Secondary | ICD-10-CM

## 2013-06-14 DIAGNOSIS — J449 Chronic obstructive pulmonary disease, unspecified: Secondary | ICD-10-CM

## 2013-06-14 DIAGNOSIS — Z1211 Encounter for screening for malignant neoplasm of colon: Secondary | ICD-10-CM

## 2013-06-14 DIAGNOSIS — G2581 Restless legs syndrome: Secondary | ICD-10-CM

## 2013-06-14 DIAGNOSIS — Z0000 Encounter for general adult medical examination without abnormal findings: Secondary | ICD-10-CM | POA: Insufficient documentation

## 2013-06-14 MED ORDER — CLONAZEPAM 0.5 MG PO TABS: 0.5000 mg | ORAL_TABLET | Freq: Every evening | ORAL | Status: DC | PRN

## 2013-06-14 NOTE — Assessment & Plan Note (Signed)
Uses the clonazepam rarely 

## 2013-06-14 NOTE — Assessment & Plan Note (Signed)
Doing well now Followed by cardiology

## 2013-06-14 NOTE — Assessment & Plan Note (Signed)
No sig symptoms Discussed giving up the last of the cigarettes

## 2013-06-14 NOTE — Assessment & Plan Note (Signed)
Doing better with staying busy and satisfied out of work PSA due next year Will try fecal immunoassay again (he had trouble with it last year)

## 2013-06-14 NOTE — Progress Notes (Signed)
Pre visit review using our clinic review tool, if applicable. No additional management support is needed unless otherwise documented below in the visit note. 

## 2013-06-14 NOTE — Patient Instructions (Signed)
Please call [phone]

## 2013-06-14 NOTE — Progress Notes (Signed)
Subjective:    Patient ID: Franklin Marks, male    DOB: 1951/02/14, 63 y.o.   MRN: [id number]  HPI Here with physical Adjusting to not working---helps neighbors, etc  Still smoking Using patch and occasionally still smokes---like 1 every 2-3 days. Puts it out quickly though Doesn't buy them---wife still smokes  Leg pain is not bad No action now on this Did find the clonazepam helpful-- hadn't refilled it since last year Only used it if they were really bad (and only 1/2)  Current Outpatient Prescriptions on File Prior to Visit  Medication Sig Dispense Refill  . aspirin 81 MG chewable tablet Chew 1 tablet (81 mg total) by mouth daily.      . carvedilol (COREG) 6.25 MG tablet Take 0.5 tablets (3.125 mg total) by mouth 2 (two) times daily with a meal.  90 tablet  3  . clonazePAM (KLONOPIN) 0.5 MG tablet Take 1 tablet (0.5 mg total) by mouth at bedtime as needed.  30 tablet  0  . furosemide (LASIX) 40 MG tablet as needed. For weight gain or swelling      . lisinopril (PRINIVIL,ZESTRIL) 5 MG tablet Take 0.5 tablets (2.5 mg total) by mouth daily.  15 tablet  0  . nitroGLYCERIN (NITROSTAT) 0.4 MG SL tablet Place 1 tablet (0.4 mg total) under the tongue every 5 (five) minutes as needed for chest pain.  25 tablet  3  . pantoprazole (PROTONIX) 40 MG tablet Take 1 tablet (40 mg total) by mouth at bedtime.  90 tablet  1  . pravastatin (PRAVACHOL) 40 MG tablet Take 1 tablet (40 mg total) by mouth daily.  30 tablet  0   No current facility-administered medications on file prior to visit.    Allergies  Allergen Reactions  . Codeine Nausea And Vomiting    Past Medical History  Diagnosis Date  . CAD (coronary artery disease)     s/p CABG 2001  . COPD (chronic obstructive pulmonary disease)     ongoing tobacco use  . GERD (gastroesophageal reflux disease)   . Anemia   . Noncompliance with medication regimen   . NSTEMI (non-ST elevated myocardial infarction) 01/10/2012    Associated with  ventricular fibrillation  . Ischemic cardiomyopathy 01/10/2012    LVEF 35-40%, mild conc hypertrophy, servere HK of anteroseptum, borderline RVH  . Atrial fibrillation 01/13/2012    Past Surgical History  Procedure Laterality Date  . [facility]  2011    [facility]  . Cardiac catheterization  12/2011    prox LAD occlusion, ostial RCA occlusion, 30% prox LCx stenosis, LIMA-LAD: 80% post-anastamosis lesion, SVG-Dx2: old occlusion w/ thrombus (noted on prior [facility] cath [facility]), SVG-Dx: patent, SVG-RCA: patent, diffuse irregs, 80-90% PDA/PLA lesions patent  . Coronary artery bypass graft  2001    Family History  Problem Relation Age of Onset  . Hyperlipidemia Mother   . Alcohol abuse Father   . Hyperlipidemia Father   . Heart disease Sister   . Hyperlipidemia Sister   . Heart disease Brother   . Hyperlipidemia Brother   . Diabetes Neg Hx   . Hypertension Neg Hx   . Hyperlipidemia Sister     History   Social History  . Marital Status: Married    Spouse Name: N/A    Number of Children: 4  . Years of Education: N/A   Occupational History  . Management --now disabled    Social History Main Topics  . Smoking status: Current Some Day Smoker --  1.00 packs/day for 45 years    Types: Cigarettes    Last Attempt to Quit: 01/10/2012  . Smokeless tobacco: Never Used     Comment: 1 every 2-3 days. wife still smokes  . Alcohol Use: Yes  . Drug Use: Yes    Special: Marijuana  . Sexual Activity: Not Currently   Other Topics Concern  . Not on file   Social History Narrative   Married twice   3 children from 1st marriage-- 1 from second   Worked as Investment banker, operational   Review of Systems  Constitutional: Negative for fatigue and unexpected weight change.       Wears seat belt  HENT: Positive for dental problem. Negative for hearing loss and tinnitus.        Needs to get teeth pulled when he gets Medicaid  Eyes: Negative for visual disturbance.       No diplopia  or unilateral vision loss  Respiratory: Negative for cough, chest tightness and shortness of breath.   Cardiovascular: Negative for chest pain, palpitations and leg swelling.  Gastrointestinal: Negative for nausea, vomiting, abdominal pain, constipation and blood in stool.       No heartburn problems on the med  Endocrine: Negative for cold intolerance and heat intolerance.  Genitourinary: Positive for difficulty urinating. Negative for urgency and frequency.       Slight dribbling No sexual problems  Musculoskeletal: Negative for arthralgias, back pain and joint swelling.  Skin: Negative for rash.       No suspicious lesions  Allergic/Immunologic: Negative for environmental allergies and immunocompromised state.  Neurological: Negative for dizziness, syncope, weakness, light-headedness, numbness and headaches.  Hematological: Negative for adenopathy. Does not bruise/bleed easily.  Psychiatric/Behavioral: Positive for sleep disturbance. Negative for dysphoric mood. The patient is not nervous/anxious.        Occ RLS symptoms       Objective:   Physical Exam  Constitutional: He is oriented to person, place, and time. He appears well-developed and well-nourished. No distress.  HENT:  Head: Normocephalic and atraumatic.  Right Ear: External ear normal.  Left Ear: External ear normal.  Mouth/Throat: Oropharynx is clear and moist. No oropharyngeal exudate.  Eyes: Conjunctivae and EOM are normal. Pupils are equal, round, and reactive to light.  Neck: Normal range of motion. Neck supple. No thyromegaly present.  Cardiovascular: Normal rate, regular rhythm and normal heart sounds.  Exam reveals no gallop.   No murmur heard. Feet warm but pulses not palpable  Pulmonary/Chest: Effort normal and breath sounds normal. No respiratory distress. He has no wheezes. He has no rales.  Abdominal: Soft. There is no tenderness.  Musculoskeletal: He exhibits no edema and no tenderness.  Lymphadenopathy:     He has no cervical adenopathy.  Neurological: He is alert and oriented to person, place, and time.  Skin: No rash noted. No erythema.  Psychiatric: He has a normal mood and affect. His behavior is normal.          Assessment & Plan:

## 2013-06-15 ENCOUNTER — Telehealth: Payer: Self-pay | Admitting: Internal Medicine

## 2013-06-15 NOTE — Telephone Encounter (Signed)
Relevant patient education assigned to patient using Emmi. ° °

## 2013-07-20 ENCOUNTER — Other Ambulatory Visit (INDEPENDENT_AMBULATORY_CARE_PROVIDER_SITE_OTHER): Payer: BC Managed Care – PPO

## 2013-07-20 DIAGNOSIS — Z1211 Encounter for screening for malignant neoplasm of colon: Secondary | ICD-10-CM

## 2013-07-20 LAB — FECAL OCCULT BLOOD, IMMUNOCHEMICAL: Fecal Occult Bld: NEGATIVE

## 2013-08-18 ENCOUNTER — Other Ambulatory Visit: Payer: Self-pay | Admitting: Internal Medicine

## 2013-08-18 NOTE — Telephone Encounter (Signed)
rx called into pharmacy

## 2013-08-18 NOTE — Telephone Encounter (Signed)
06/14/2013 

## 2013-08-18 NOTE — Telephone Encounter (Signed)
Okay #30 x 0 

## 2013-08-20 ENCOUNTER — Other Ambulatory Visit: Payer: Self-pay | Admitting: *Deleted

## 2013-08-20 MED ORDER — CARVEDILOL 6.25 MG PO TABS: 3.1250 mg | ORAL_TABLET | Freq: Two times a day (BID) | ORAL | Status: DC

## 2013-09-05 ENCOUNTER — Other Ambulatory Visit: Payer: Self-pay | Admitting: Internal Medicine

## 2013-09-06 ENCOUNTER — Other Ambulatory Visit: Payer: Self-pay | Admitting: Internal Medicine

## 2013-09-22 ENCOUNTER — Other Ambulatory Visit: Payer: Self-pay | Admitting: Internal Medicine

## 2013-12-14 ENCOUNTER — Other Ambulatory Visit: Payer: Self-pay | Admitting: Internal Medicine

## 2013-12-15 NOTE — Telephone Encounter (Signed)
Okay #30 x 0 

## 2013-12-15 NOTE — Telephone Encounter (Signed)
#  30x0 last filled on 08/18/13, last appt was cpx on 06/14/13, and has cpx scheduled for 06/16/14.

## 2013-12-15 NOTE — Telephone Encounter (Signed)
Called to [facility]. 

## 2013-12-16 ENCOUNTER — Other Ambulatory Visit: Payer: Self-pay | Admitting: *Deleted

## 2013-12-16 MED ORDER — LISINOPRIL 5 MG PO TABS: ORAL_TABLET | ORAL | Status: DC

## 2013-12-20 ENCOUNTER — Encounter: Payer: Self-pay | Admitting: *Deleted

## 2014-02-24 ENCOUNTER — Encounter (HOSPITAL_COMMUNITY): Payer: Self-pay | Admitting: Cardiovascular Disease

## 2014-04-11 ENCOUNTER — Ambulatory Visit (INDEPENDENT_AMBULATORY_CARE_PROVIDER_SITE_OTHER): Payer: BLUE CROSS/BLUE SHIELD | Admitting: Cardiovascular Disease

## 2014-04-11 ENCOUNTER — Encounter: Payer: Self-pay | Admitting: Cardiovascular Disease

## 2014-04-11 VITALS — BP 100/70 | HR 55 | Ht 71.0 in | Wt 174.5 lb

## 2014-04-11 DIAGNOSIS — I4891 Unspecified atrial fibrillation: Secondary | ICD-10-CM

## 2014-04-11 DIAGNOSIS — I739 Peripheral vascular disease, unspecified: Secondary | ICD-10-CM

## 2014-04-11 DIAGNOSIS — I2510 Atherosclerotic heart disease of native coronary artery without angina pectoris: Secondary | ICD-10-CM

## 2014-04-11 DIAGNOSIS — E785 Hyperlipidemia, unspecified: Secondary | ICD-10-CM

## 2014-04-11 NOTE — Assessment & Plan Note (Signed)
He is doing well and denies anginal symptoms. Continue medical therapy.

## 2014-04-11 NOTE — Progress Notes (Signed)
HPI  This is a 64 year old man who is here today for a followup visit regarding peripheral arterial disease and coronary artery disease. He has known history of cardiac arrest occurring in the setting of coronary artery disease and chest pain. He is status post CABG in 2001. He has known history of peripheral arterial disease with mildly reduced ABI bilaterally and  chronic total occlusion of the SFA on both sides with reconstitution distally via collaterals.  He also has restless leg syndrome and was started on clonazepam.  He quit smoking but not completely. He denies claudication or chest pain.  Allergies  Allergen Reactions  . Codeine Nausea And Vomiting     Current Outpatient Prescriptions on File Prior to Visit  Medication Sig Dispense Refill  . aspirin 81 MG chewable tablet Chew 1 tablet (81 mg total) by mouth daily.    . carvedilol (COREG) 6.25 MG tablet TAKE ONE-HALF (1/2) OF A TABLET TWO TIMES PER DAY 90 tablet 3  . clonazePAM (KLONOPIN) 0.5 MG tablet TAKE 1 TABLET BY MOUTH EVERY NIGHT AT BEDTIME AS NEEDED. 30 tablet 0  . lisinopril (PRINIVIL,ZESTRIL) 5 MG tablet TAKE 1/2 TABLET BY MOUTH EVERY DAY. 45 tablet 1  . nitroGLYCERIN (NITROSTAT) 0.4 MG SL tablet Place 1 tablet (0.4 mg total) under the tongue every 5 (five) minutes as needed for chest pain. 25 tablet 3  . pravastatin (PRAVACHOL) 40 MG tablet TAKE 1 TABLET BY MOUTH DAILY 30 tablet 0   No current facility-administered medications on file prior to visit.     Past Medical History  Diagnosis Date  . CAD (coronary artery disease)     s/p CABG 2001  . COPD (chronic obstructive pulmonary disease)     ongoing tobacco use  . GERD (gastroesophageal reflux disease)   . Anemia   . Noncompliance with medication regimen   . NSTEMI (non-ST elevated myocardial infarction) 01/10/2012    Associated with ventricular fibrillation  . Ischemic cardiomyopathy 01/10/2012    LVEF 35-40%, mild conc hypertrophy, servere HK of  anteroseptum, borderline RVH  . Atrial fibrillation 01/13/2012     Past Surgical History  Procedure Laterality Date  . Cardiac catheterization  2011    At [facility]  . Cardiac catheterization  12/2011    prox LAD occlusion, ostial RCA occlusion, 30% prox LCx stenosis, LIMA-LAD: 80% post-anastamosis lesion, SVG-Dx2: old occlusion w/ thrombus (noted on prior 2011 cath at [facility]), SVG-Dx: patent, SVG-RCA: patent, diffuse irregs, 80-90% PDA/PLA lesions patent  . Coronary artery bypass graft  2001  . Left heart catheterization with coronary angiogram N/A 01/10/2012    Procedure: LEFT HEART CATHETERIZATION WITH CORONARY ANGIOGRAM;  Surgeon: Lennette Bihari, MD;  Location: [facility] CATH LAB;  Service: Cardiovascular;  Laterality: N/A;     Family History  Problem Relation Age of Onset  . Hyperlipidemia Mother   . Alcohol abuse Father   . Hyperlipidemia Father   . Heart disease Sister   . Hyperlipidemia Sister   . Heart disease Brother   . Hyperlipidemia Brother   . Diabetes Neg Hx   . Hypertension Neg Hx   . Hyperlipidemia Sister      History   Social History  . Marital Status: Married    Spouse Name: N/A    Number of Children: 4  . Years of Education: N/A   Occupational History  . Management --now disabled    Social History Main Topics  . Smoking status: Current Some Day Smoker -- 1.00 packs/day for 45  years    Types: Cigarettes    Last Attempt to Quit: 01/10/2012  . Smokeless tobacco: Never Used     Comment: 1 every 2-3 days. wife still smokes  . Alcohol Use: No  . Drug Use: Yes    Special: Marijuana  . Sexual Activity: Not Currently   Other Topics Concern  . 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  He appears well-developed and well-nourished. No distress.  HENT: No nasal discharge.  Head: Normocephalic and atraumatic.  Eyes: Pupils are equal and round. Right eye exhibits no discharge. Left eye exhibits no discharge.  Neck: Normal range of motion. Neck supple. No JVD present. No thyromegaly present.  Cardiovascular: Normal rate, regular rhythm, normal heart sounds and. Exam reveals no gallop and no friction rub. No murmur heard.  Pulmonary/Chest: Effort normal and breath sounds normal. No stridor. No respiratory distress. He has no wheezes. He has no rales. He exhibits no tenderness.  Abdominal: Soft. Bowel sounds are normal. He exhibits no distension. There is no tenderness. There is no rebound and no guarding.  Musculoskeletal: Normal range of motion. He exhibits no edema and no tenderness.  Neurological: He is alert and oriented to person, place, and time. Coordination normal.  Skin: Skin is warm and dry. No rash noted. He is not diaphoretic. No erythema. No pallor.  Psychiatric: He has a normal mood and affect. His behavior is normal. Judgment and thought content normal.  Vascular: Femoral pulses are slightly diminished bilaterally. Posterior tibial pulse is palpable on both sides. Dorsalis pedis is absent.   ZOX:WRUEAEKG:Sinus  Bradycardia  -First degree A-V block  PRi = 238 -Right bundle branch block.   ABNORMAL      ASSESSMENT AND PLAN

## 2014-04-11 NOTE — Assessment & Plan Note (Addendum)
He has chronically occluded SFA bilaterally. Currently, he denies claudication. Thus, I recommend continuing medical therapy. I again stressed the importance of complete smoking cessation.

## 2014-04-11 NOTE — Patient Instructions (Signed)
Continue same medications.   Your physician wants you to follow-up in: 1 year.  You will receive a reminder letter in the mail two months in advance. If you don't receive a letter, please call our office to schedule the follow-up appointment.  

## 2014-04-11 NOTE — Assessment & Plan Note (Signed)
Lab Results  Component Value Date   CHOL 151 06/07/2013   HDL 30.30* 06/07/2013   LDLCALC 50 06/07/2013   TRIG 352.0* 06/07/2013   CHOLHDL 5 06/07/2013   His LDL was at target. Triglyceride was high and HDL was low. I discussed with him the importance of diet and exercise.

## 2014-06-02 ENCOUNTER — Other Ambulatory Visit: Payer: Self-pay | Admitting: Internal Medicine

## 2014-06-03 NOTE — Telephone Encounter (Signed)
12/15/13 

## 2014-06-03 NOTE — Telephone Encounter (Signed)
rx called in to [facility].

## 2014-06-03 NOTE — Telephone Encounter (Signed)
Approved: 30 x 0 

## 2014-06-06 ENCOUNTER — Other Ambulatory Visit: Payer: Self-pay | Admitting: *Deleted

## 2014-06-06 MED ORDER — CARVEDILOL 6.25 MG PO TABS: 3.1250 mg | ORAL_TABLET | Freq: Two times a day (BID) | ORAL | Status: DC

## 2014-06-06 NOTE — Telephone Encounter (Signed)
Refill sent to pharmacy electronically. °

## 2014-06-06 NOTE — Telephone Encounter (Signed)
Approved: Its on our list, so okay to refill for a year

## 2014-06-06 NOTE — Telephone Encounter (Signed)
Pt called to ck on what med pt takes 1/2 tab twice a day. Pt cannot remember name; advised pt [facility] refill of carvedilol 6.25 mg with those instructions. Pt said that is the name he could not remember.

## 2014-06-06 NOTE — Telephone Encounter (Signed)
Ok to fill? [facility] they don't have a active prescription

## 2014-06-16 ENCOUNTER — Encounter: Payer: Self-pay | Admitting: Internal Medicine

## 2014-06-16 ENCOUNTER — Ambulatory Visit (INDEPENDENT_AMBULATORY_CARE_PROVIDER_SITE_OTHER): Payer: BLUE CROSS/BLUE SHIELD | Admitting: Internal Medicine

## 2014-06-16 VITALS — BP 104/62 | HR 53 | Temp 98.0°F | Ht 67.5 in | Wt 167.1 lb

## 2014-06-16 DIAGNOSIS — Z0000 Encounter for general adult medical examination without abnormal findings: Secondary | ICD-10-CM

## 2014-06-16 DIAGNOSIS — J439 Emphysema, unspecified: Secondary | ICD-10-CM

## 2014-06-16 DIAGNOSIS — Z1211 Encounter for screening for malignant neoplasm of colon: Secondary | ICD-10-CM

## 2014-06-16 DIAGNOSIS — I480 Paroxysmal atrial fibrillation: Secondary | ICD-10-CM

## 2014-06-16 DIAGNOSIS — I739 Peripheral vascular disease, unspecified: Secondary | ICD-10-CM

## 2014-06-16 DIAGNOSIS — Z23 Encounter for immunization: Secondary | ICD-10-CM

## 2014-06-16 DIAGNOSIS — I255 Ischemic cardiomyopathy: Secondary | ICD-10-CM

## 2014-06-16 MED ORDER — ZOSTER VACCINE LIVE 19400 UNT/0.65ML ~~LOC~~ SOLR: 0.6500 mL | Freq: Once | SUBCUTANEOUS | Status: DC

## 2014-06-16 NOTE — Assessment & Plan Note (Signed)
No Rx needed He is close to stopping smoking and committed to it now

## 2014-06-16 NOTE — Progress Notes (Signed)
Pre visit review using our clinic review tool, if applicable. No additional management support is needed unless otherwise documented below in the visit note. 

## 2014-06-16 NOTE — Assessment & Plan Note (Signed)
On appropriate meds Normal fluid status

## 2014-06-16 NOTE — Assessment & Plan Note (Signed)
Only after MI No evidence on ongoing a fib burden

## 2014-06-16 NOTE — Progress Notes (Signed)
Subjective:    Patient ID: Franklin Marks, male    DOB: 02/01/1951, 64 y.o.   MRN: [id number]  HPI Here for physical  Doing well Has adjusted to retirement Needs to getting teeth pulled and get dentures  No heart problems No palpitations No SOB Able to walk regularly No leg pain for the most part---might get slight aching if he pushes it a bit Enjoys golf  Current Outpatient Prescriptions on File Prior to Visit  Medication Sig Dispense Refill  . aspirin 81 MG chewable tablet Chew 1 tablet (81 mg total) by mouth daily.    . carvedilol (COREG) 6.25 MG tablet Take 0.5 tablets (3.125 mg total) by mouth 2 (two) times daily with a meal. 90 tablet 3  . clonazePAM (KLONOPIN) 0.5 MG tablet TAKE ONE TABLET AT BEDTIME AS NEEDED 30 tablet 0  . lisinopril (PRINIVIL,ZESTRIL) 5 MG tablet TAKE 1/2 TABLET BY MOUTH EVERY DAY. 45 tablet 1  . nitroGLYCERIN (NITROSTAT) 0.4 MG SL tablet Place 1 tablet (0.4 mg total) under the tongue every 5 (five) minutes as needed for chest pain. 25 tablet 3  . pravastatin (PRAVACHOL) 40 MG tablet TAKE 1 TABLET BY MOUTH DAILY 30 tablet 0   No current facility-administered medications on file prior to visit.    Allergies  Allergen Reactions  . Codeine Nausea And Vomiting    Past Medical History  Diagnosis Date  . CAD (coronary artery disease)     s/p CABG 2001  . COPD (chronic obstructive pulmonary disease)     ongoing tobacco use  . GERD (gastroesophageal reflux disease)   . Anemia   . NSTEMI (non-ST elevated myocardial infarction) 01/10/2012    Associated with ventricular fibrillation  . Ischemic cardiomyopathy 01/10/2012    LVEF 35-40%, mild conc hypertrophy, servere HK of anteroseptum, borderline RVH  . Atrial fibrillation 01/13/2012    Past Surgical History  Procedure Laterality Date  . Cardiac [facility]  2011    [facility]  . Cardiac catheterization  12/2011    prox LAD occlusion, ostial RCA occlusion, 30% prox LCx stenosis, LIMA-LAD: 80%  post-anastamosis lesion, SVG-Dx2: old occlusion w/ thrombus (noted on [facility]), SVG-Dx: patent, SVG-RCA: patent, diffuse irregs, 80-90% PDA/PLA lesions patent  . Coronary artery bypass graft  2001  . Left heart catheterization with coronary angiogram N/A 01/10/2012    Procedure: LEFT HEART CATHETERIZATION WITH CORONARY ANGIOGRAM;  Surgeon: Lennette Biharihomas A Kelly, MD;  Location[facility];  Service: Cardiovascular;  Laterality: N/A;    Family History  Problem Relation Age of Onset  . Hyperlipidemia Mother   . Alcohol abuse Father   . Hyperlipidemia Father   . Heart disease Sister   . Hyperlipidemia Sister   . Heart disease Brother   . Hyperlipidemia Brother   . Diabetes Neg Hx   . Hypertension Neg Hx   . Hyperlipidemia Sister     History   Social History  . Marital Status: Married    Spouse Name: N/A  . Number of Children: 4  . Years of Education: N/A   Occupational History  . Management --now disabled    Social History Main Topics  . Smoking status: Current Some Day Smoker -- 1.00 packs/day for 45 years    Types: Cigarettes    Last Attempt to Quit: 01/10/2012  . Smokeless tobacco: Never Used     Comment: 1 every 2-3 days. wife still smokes  . Alcohol Use: No  . Drug Use: Yes  Special: Marijuana  . Sexual Activity: Not Currently   Other Topics Concern  . Not on file   Social History Narrative   Married twice   3 children from 1st marriage-- 1 from second   Worked as Investment banker, operational   Review of Systems  Constitutional: Negative for fatigue and unexpected weight change.       Wears seat belt  HENT: Positive for dental problem. Negative for hearing loss and tinnitus.   Eyes: Negative for visual disturbance.       No diplopia or unilateral vision loss  Respiratory: Negative for cough, chest tightness and shortness of breath.        Has cut back on his smoking --close to quitting  Cardiovascular: Negative for chest pain, palpitations and leg  swelling.  Gastrointestinal: Negative for nausea, vomiting, constipation, abdominal distention and anal bleeding.       No more acid reflux  Endocrine: Negative for polydipsia and polyuria.  Genitourinary: Negative for urgency.       Nocturia x 2 Some dribbling No sig ED  Musculoskeletal: Negative for back pain, joint swelling and arthralgias.  Skin: Negative for rash.       Some benign moles   Allergic/Immunologic: Negative for environmental allergies.  Neurological: Negative for dizziness, syncope, weakness, light-headedness, numbness and headaches.  Hematological: Negative for adenopathy. Does not bruise/bleed easily.  Psychiatric/Behavioral: Positive for sleep disturbance. Negative for dysphoric mood. The patient is not nervous/anxious.        Some trouble initiating sleep--clonazepam helps       Objective:   Physical Exam  Constitutional: He appears well-developed and well-nourished. No distress.  HENT:  Head: Normocephalic and atraumatic.  Right Ear: External ear normal.  Left Ear: External ear normal.  Mouth/Throat: Oropharynx is clear and moist. No oropharyngeal exudate.  Eyes: Conjunctivae and EOM are normal. Pupils are equal, round, and reactive to light.  Neck: Normal range of motion. Neck supple. No thyromegaly present.  Cardiovascular: Regular rhythm and normal heart sounds.  Exam reveals no gallop.   No murmur heard. Slight bradycardia Feet warm without pulses  Pulmonary/Chest: Effort normal and breath sounds normal. No respiratory distress. He has no wheezes. He has no rales.  Abdominal: Soft. There is no tenderness.  Musculoskeletal: He exhibits no edema or tenderness.  Lymphadenopathy:    He has no cervical adenopathy.  Skin:  Benign keratoses  Psychiatric: He has a normal mood and affect. His behavior is normal.          Assessment & Plan:

## 2014-06-16 NOTE — Assessment & Plan Note (Signed)
Doing well Determined to stop smoking soon---counseled on this Discussed prostate cancer screening---he wants to hold off Will do fecal immunoassay again Working on fitness

## 2014-06-16 NOTE — Assessment & Plan Note (Signed)
No pain with activity now

## 2014-06-17 LAB — COMPREHENSIVE METABOLIC PANEL
ALBUMIN: 4.1 g/dL (ref 3.5–5.2)
ALK PHOS: 82 U/L (ref 39–117)
ALT: 11 U/L (ref 0–53)
AST: 15 U/L (ref 0–37)
BUN: 14 mg/dL (ref 6–23)
CALCIUM: 9.5 mg/dL (ref 8.4–10.5)
CHLORIDE: 105 meq/L (ref 96–112)
CO2: 30 meq/L (ref 19–32)
Creatinine, Ser: 0.96 mg/dL (ref 0.40–1.50)
GFR: 83.98 mL/min (ref >60.00)
GLUCOSE: 66 mg/dL — AB (ref 70–99)
Potassium: 4.6 mEq/L (ref 3.5–5.1)
Sodium: 137 mEq/L (ref 135–145)
Total Bilirubin: 0.4 mg/dL (ref 0.2–1.2)
Total Protein: 7.2 g/dL (ref 6.0–8.3)

## 2014-06-17 LAB — T4, FREE: Free T4: 0.84 ng/dL (ref 0.60–1.60)

## 2014-06-17 LAB — LIPID PANEL
Cholesterol: 127 mg/dL (ref 0–200)
HDL: 27.7 mg/dL — AB (ref >39.00)
NONHDL: 99.3
Total CHOL/HDL Ratio: 5
Triglycerides: 263 mg/dL — ABNORMAL HIGH (ref 0.0–149.0)
VLDL: 52.6 mg/dL — ABNORMAL HIGH (ref 0.0–40.0)

## 2014-06-17 LAB — LDL CHOLESTEROL, DIRECT: Direct LDL: 69 mg/dL

## 2014-06-17 LAB — CBC WITH DIFFERENTIAL/PLATELET
BASOS PCT: 0.3 % (ref 0.0–3.0)
Basophils Absolute: 0 10*3/uL (ref 0.0–0.1)
EOS PCT: 3.1 % (ref 0.0–5.0)
Eosinophils Absolute: 0.3 10*3/uL (ref 0.0–0.7)
HCT: 41.9 % (ref 39.0–52.0)
HEMOGLOBIN: 14.4 g/dL (ref 13.0–17.0)
LYMPHS PCT: 24 % (ref 12.0–46.0)
Lymphs Abs: 2.3 10*3/uL (ref 0.7–4.0)
MCHC: 34.4 g/dL (ref 30.0–36.0)
MCV: 91.4 fl (ref 78.0–100.0)
MONOS PCT: 6.9 % (ref 3.0–12.0)
Monocytes Absolute: 0.7 10*3/uL (ref 0.1–1.0)
NEUTROS ABS: 6.3 10*3/uL (ref 1.4–7.7)
NEUTROS PCT: 65.7 % (ref 43.0–77.0)
Platelets: 177 10*3/uL (ref 150.0–400.0)
RBC: 4.59 Mil/uL (ref 4.22–5.81)
RDW: 13.6 % (ref 11.5–15.5)
WBC: 9.6 10*3/uL (ref 4.0–10.5)

## 2014-07-09 ENCOUNTER — Other Ambulatory Visit: Payer: Self-pay | Admitting: Internal Medicine

## 2014-07-15 ENCOUNTER — Other Ambulatory Visit: Payer: Self-pay | Admitting: Internal Medicine

## 2014-08-09 ENCOUNTER — Other Ambulatory Visit: Payer: Self-pay | Admitting: *Deleted

## 2014-08-09 MED ORDER — PRAVASTATIN SODIUM 40 MG PO TABS: 40.0000 mg | ORAL_TABLET | Freq: Every day | ORAL | Status: DC

## 2014-08-09 MED ORDER — LISINOPRIL 5 MG PO TABS: ORAL_TABLET | ORAL | Status: DC

## 2014-08-09 MED ORDER — CARVEDILOL 6.25 MG PO TABS: 3.1250 mg | ORAL_TABLET | Freq: Two times a day (BID) | ORAL | Status: DC

## 2014-09-16 ENCOUNTER — Other Ambulatory Visit: Payer: Self-pay | Admitting: Internal Medicine

## 2014-09-16 NOTE — Telephone Encounter (Signed)
Ok to phone in clonazepam 

## 2014-09-16 NOTE — Telephone Encounter (Signed)
06/03/2014 LETVAK PATIENT, Please send back to me for call in

## 2014-09-16 NOTE — Telephone Encounter (Signed)
rx called into pharmacy

## 2014-11-23 ENCOUNTER — Other Ambulatory Visit: Payer: Self-pay | Admitting: Internal Medicine

## 2014-11-23 NOTE — Telephone Encounter (Signed)
09/16/2014 

## 2014-11-23 NOTE — Telephone Encounter (Signed)
rx called into pharmacy

## 2014-11-23 NOTE — Telephone Encounter (Signed)
Last filled 09/16/14 #30 by Regina--please advise

## 2014-11-23 NOTE — Telephone Encounter (Signed)
Approved: okay #30 x 0 

## 2015-01-10 ENCOUNTER — Telehealth: Payer: Self-pay | Admitting: Internal Medicine

## 2015-01-10 NOTE — Telephone Encounter (Signed)
Pt called he is going [place] for [place] work.  They paperwork wants a dr recommendation for him to get his denatures.

## 2015-01-10 NOTE — Telephone Encounter (Signed)
I'm confused by this message, does he need something from us? Or he needs a dentist?

## 2015-01-11 NOTE — Telephone Encounter (Signed)
Spoke with patient and he doesn't need anything from Dr. Alphonsus SiasLetvak, it's based on his income

## 2015-01-11 NOTE — Telephone Encounter (Signed)
So does he have a form to drop off or needs a letter?

## 2015-01-11 NOTE — Telephone Encounter (Signed)
That's what the patient stated.  He had paperwork with him and could not find the question on the form for me to type exactly what they wanted

## 2015-01-11 NOTE — Telephone Encounter (Signed)
Left message on machine asking pt to return my call for more information.   After talking with robin, pt needs a form filled out to say he needs dentures because of his heart problems? [facility] [place] [facility] and needs permission and authorization for dentures.

## 2015-01-31 ENCOUNTER — Other Ambulatory Visit: Payer: Self-pay | Admitting: Internal Medicine

## 2015-01-31 NOTE — Telephone Encounter (Signed)
rx called into pharmacy

## 2015-01-31 NOTE — Telephone Encounter (Signed)
11/23/14 

## 2015-01-31 NOTE — Telephone Encounter (Signed)
Approved: 30 x 0 

## 2015-03-06 ENCOUNTER — Telehealth: Payer: Self-pay | Admitting: Internal Medicine

## 2015-03-06 NOTE — Telephone Encounter (Signed)
Spoke with pt and advised Dr. Alphonsus SiasLetvak has never prescribed antibiotics, per pt it's been years since he took anything and he didn't have a problem with them. I advised pt that maybe he also needed to contact his cardiologist because Dr. Alphonsus SiasLetvak was out of the office today.

## 2015-03-06 NOTE — Telephone Encounter (Signed)
Pt is having dental sx on Wednesday (teeth extraction) and dentist wants to make sure that pt does not have problem with antibodies (antibiotics?)  cb number is [phone]

## 2015-03-14 ENCOUNTER — Emergency Department (HOSPITAL_COMMUNITY): Payer: Medicare PPO

## 2015-03-14 ENCOUNTER — Inpatient Hospital Stay (HOSPITAL_COMMUNITY): Payer: Medicare PPO

## 2015-03-14 ENCOUNTER — Encounter (HOSPITAL_COMMUNITY): Payer: Self-pay | Admitting: Emergency Medicine

## 2015-03-14 ENCOUNTER — Inpatient Hospital Stay (HOSPITAL_COMMUNITY)
Admission: EM | Admit: 2015-03-14 | Discharge: 2015-03-16 | DRG: 247 | Disposition: A | Discharge status: Home / Self Care | Payer: Medicare PPO | Attending: Internal Medicine | Admitting: Internal Medicine

## 2015-03-14 DIAGNOSIS — Z79899 Other long term (current) drug therapy: Secondary | ICD-10-CM | POA: Diagnosis not present

## 2015-03-14 DIAGNOSIS — I10 Essential (primary) hypertension: Secondary | ICD-10-CM | POA: Diagnosis present

## 2015-03-14 DIAGNOSIS — R079 Chest pain, unspecified: Secondary | ICD-10-CM

## 2015-03-14 DIAGNOSIS — I440 Atrioventricular block, first degree: Secondary | ICD-10-CM | POA: Diagnosis not present

## 2015-03-14 DIAGNOSIS — I480 Paroxysmal atrial fibrillation: Secondary | ICD-10-CM | POA: Diagnosis present

## 2015-03-14 DIAGNOSIS — I209 Angina pectoris, unspecified: Secondary | ICD-10-CM | POA: Diagnosis present

## 2015-03-14 DIAGNOSIS — Z720 Tobacco use: Secondary | ICD-10-CM | POA: Diagnosis present

## 2015-03-14 DIAGNOSIS — Z7982 Long term (current) use of aspirin: Secondary | ICD-10-CM

## 2015-03-14 DIAGNOSIS — K219 Gastro-esophageal reflux disease without esophagitis: Secondary | ICD-10-CM | POA: Diagnosis not present

## 2015-03-14 DIAGNOSIS — I25710 Atherosclerosis of autologous vein coronary artery bypass graft(s) with unstable angina pectoris: Secondary | ICD-10-CM | POA: Diagnosis not present

## 2015-03-14 DIAGNOSIS — I2510 Atherosclerotic heart disease of native coronary artery without angina pectoris: Secondary | ICD-10-CM | POA: Diagnosis present

## 2015-03-14 DIAGNOSIS — I25110 Atherosclerotic heart disease of native coronary artery with unstable angina pectoris: Secondary | ICD-10-CM | POA: Diagnosis not present

## 2015-03-14 DIAGNOSIS — R7303 Prediabetes: Secondary | ICD-10-CM | POA: Diagnosis present

## 2015-03-14 DIAGNOSIS — I255 Ischemic cardiomyopathy: Secondary | ICD-10-CM | POA: Diagnosis present

## 2015-03-14 DIAGNOSIS — J449 Chronic obstructive pulmonary disease, unspecified: Secondary | ICD-10-CM | POA: Diagnosis present

## 2015-03-14 DIAGNOSIS — E785 Hyperlipidemia, unspecified: Secondary | ICD-10-CM | POA: Diagnosis present

## 2015-03-14 DIAGNOSIS — F1210 Cannabis abuse, uncomplicated: Secondary | ICD-10-CM | POA: Diagnosis present

## 2015-03-14 DIAGNOSIS — Z955 Presence of coronary angioplasty implant and graft: Secondary | ICD-10-CM | POA: Diagnosis present

## 2015-03-14 DIAGNOSIS — R001 Bradycardia, unspecified: Secondary | ICD-10-CM | POA: Diagnosis present

## 2015-03-14 DIAGNOSIS — I25119 Atherosclerotic heart disease of native coronary artery with unspecified angina pectoris: Secondary | ICD-10-CM | POA: Diagnosis present

## 2015-03-14 DIAGNOSIS — I739 Peripheral vascular disease, unspecified: Secondary | ICD-10-CM | POA: Diagnosis present

## 2015-03-14 DIAGNOSIS — I4510 Unspecified right bundle-branch block: Secondary | ICD-10-CM | POA: Diagnosis not present

## 2015-03-14 DIAGNOSIS — G2581 Restless legs syndrome: Secondary | ICD-10-CM | POA: Diagnosis present

## 2015-03-14 DIAGNOSIS — I252 Old myocardial infarction: Secondary | ICD-10-CM

## 2015-03-14 DIAGNOSIS — Z951 Presence of aortocoronary bypass graft: Secondary | ICD-10-CM

## 2015-03-14 DIAGNOSIS — I214 Non-ST elevation (NSTEMI) myocardial infarction: Secondary | ICD-10-CM | POA: Diagnosis present

## 2015-03-14 DIAGNOSIS — F17210 Nicotine dependence, cigarettes, uncomplicated: Secondary | ICD-10-CM | POA: Diagnosis present

## 2015-03-14 DIAGNOSIS — I4891 Unspecified atrial fibrillation: Secondary | ICD-10-CM | POA: Diagnosis present

## 2015-03-14 DIAGNOSIS — Z23 Encounter for immunization: Secondary | ICD-10-CM

## 2015-03-14 DIAGNOSIS — I200 Unstable angina: Secondary | ICD-10-CM | POA: Diagnosis not present

## 2015-03-14 LAB — RAPID URINE DRUG SCREEN, HOSP PERFORMED
AMPHETAMINES: NOT DETECTED
Barbiturates: NOT DETECTED
Benzodiazepines: NOT DETECTED
Cocaine: NOT DETECTED
Opiates: POSITIVE — AB
TETRAHYDROCANNABINOL: POSITIVE — AB

## 2015-03-14 LAB — CBC
HEMATOCRIT: 41.4 % (ref 39.0–52.0)
HEMOGLOBIN: 13.9 g/dL (ref 13.0–17.0)
MCH: 31 pg (ref 26.0–34.0)
MCHC: 33.6 g/dL (ref 30.0–36.0)
MCV: 92.2 fL (ref 78.0–100.0)
Platelets: 170 10*3/uL (ref 150–400)
RBC: 4.49 MIL/uL (ref 4.22–5.81)
RDW: 13 % (ref 11.5–15.5)
WBC: 10.7 10*3/uL — ABNORMAL HIGH (ref 4.0–10.5)

## 2015-03-14 LAB — BASIC METABOLIC PANEL
ANION GAP: 9 (ref 5–15)
BUN: 17 mg/dL (ref 6–20)
CALCIUM: 9.2 mg/dL (ref 8.9–10.3)
CO2: 25 mmol/L (ref 22–32)
Chloride: 106 mmol/L (ref 101–111)
Creatinine, Ser: 0.95 mg/dL (ref 0.61–1.24)
GFR calc Af Amer: >60 mL/min (ref >60)
GFR calc non Af Amer: >60 mL/min (ref >60)
GLUCOSE: 124 mg/dL — AB (ref 65–99)
POTASSIUM: 4.3 mmol/L (ref 3.5–5.1)
Sodium: 140 mmol/L (ref 135–145)

## 2015-03-14 LAB — LIPID PANEL
CHOL/HDL RATIO: 5.1 ratio
Cholesterol: 137 mg/dL (ref 0–200)
HDL: 27 mg/dL — AB (ref >40)
LDL CALC: 51 mg/dL (ref 0–99)
TRIGLYCERIDES: 293 mg/dL — AB (ref <150)
VLDL: 59 mg/dL — ABNORMAL HIGH (ref 0–40)

## 2015-03-14 LAB — I-STAT CHEM 8, ED
BUN: 20 mg/dL (ref 6–20)
CHLORIDE: 103 mmol/L (ref 101–111)
CREATININE: 1 mg/dL (ref 0.61–1.24)
Calcium, Ion: 1.18 mmol/L (ref 1.13–1.30)
GLUCOSE: 114 mg/dL — AB (ref 65–99)
HCT: 42 % (ref 39.0–52.0)
Hemoglobin: 14.3 g/dL (ref 13.0–17.0)
POTASSIUM: 4.1 mmol/L (ref 3.5–5.1)
Sodium: 140 mmol/L (ref 135–145)
TCO2: 26 mmol/L (ref 0–100)

## 2015-03-14 LAB — NM MYOCAR MULTI W/SPECT W/WALL MOTION / EF
CHL CUP RESTING HR STRESS: 46 {beats}/min
CSEPPHR: 101 {beats}/min
Estimated workload: 1 METS
Exercise duration (min): 5 min
Exercise duration (sec): 3 s
LHR: 0.05
LV dias vol: 161 mL
LVSYSVOL: 77 mL
SDS: 5
SRS: 7
SSS: 12
TID: 1.4

## 2015-03-14 LAB — TROPONIN I
Troponin I: 0.03 ng/mL (ref <0.031)
Troponin I: 0.06 ng/mL — ABNORMAL HIGH (ref <0.031)
Troponin I: 0.06 ng/mL — ABNORMAL HIGH (ref <0.031)

## 2015-03-14 LAB — PROTIME-INR
INR: 1.09 (ref 0.00–1.49)
PROTHROMBIN TIME: 14.3 s (ref 11.6–15.2)

## 2015-03-14 LAB — APTT: aPTT: 28 seconds (ref 24–37)

## 2015-03-14 LAB — I-STAT TROPONIN, ED: Troponin i, poc: 0.01 ng/mL (ref 0.00–0.08)

## 2015-03-14 LAB — GLUCOSE, CAPILLARY: GLUCOSE-CAPILLARY: 132 mg/dL — AB (ref 65–99)

## 2015-03-14 MED ORDER — TECHNETIUM TC 99M SESTAMIBI GENERIC - CARDIOLITE
10.0000 | Freq: Once | INTRAVENOUS | Status: AC | PRN
  Administered 2015-03-14: 10 via INTRAVENOUS

## 2015-03-14 MED ORDER — SODIUM CHLORIDE 0.9 % WEIGHT BASED INFUSION
3.0000 mL/kg/h | INTRAVENOUS | Status: DC
  Administered 2015-03-15: 3 mL/kg/h via INTRAVENOUS

## 2015-03-14 MED ORDER — INFLUENZA VAC SPLIT QUAD 0.5 ML IM SUSY
0.5000 mL | PREFILLED_SYRINGE | INTRAMUSCULAR | Status: AC
  Administered 2015-03-16: 11:00:00 0.5 mL via INTRAMUSCULAR
  Filled 2015-03-14: qty 0.5

## 2015-03-14 MED ORDER — SODIUM CHLORIDE 0.9 % WEIGHT BASED INFUSION
1.0000 mL/kg/h | INTRAVENOUS | Status: DC
  Administered 2015-03-15: 1 mL/kg/h via INTRAVENOUS

## 2015-03-14 MED ORDER — LISINOPRIL 5 MG PO TABS
2.5000 mg | ORAL_TABLET | Freq: Every day | ORAL | Status: DC
  Administered 2015-03-14 – 2015-03-16 (×2): 2.5 mg via ORAL
  Filled 2015-03-14 (×3): qty 1

## 2015-03-14 MED ORDER — ACETAMINOPHEN 650 MG RE SUPP: 650.0000 mg | Freq: Four times a day (QID) | RECTAL | Status: DC | PRN

## 2015-03-14 MED ORDER — SODIUM CHLORIDE 0.9 % IV SOLN
INTRAVENOUS | Status: DC
  Administered 2015-03-14: 07:00:00 via INTRAVENOUS

## 2015-03-14 MED ORDER — ASPIRIN 81 MG PO CHEW
81.0000 mg | CHEWABLE_TABLET | ORAL | Status: AC
  Administered 2015-03-15: 81 mg via ORAL
  Filled 2015-03-14: qty 1

## 2015-03-14 MED ORDER — GUAIFENESIN ER 600 MG PO TB12
600.0000 mg | ORAL_TABLET | Freq: Two times a day (BID) | ORAL | Status: DC | PRN
  Filled 2015-03-14: qty 1

## 2015-03-14 MED ORDER — PANTOPRAZOLE SODIUM 40 MG PO TBEC
40.0000 mg | DELAYED_RELEASE_TABLET | Freq: Every day | ORAL | Status: DC
  Administered 2015-03-14 – 2015-03-16 (×3): 40 mg via ORAL
  Filled 2015-03-14 (×3): qty 1

## 2015-03-14 MED ORDER — ATORVASTATIN CALCIUM 80 MG PO TABS
80.0000 mg | ORAL_TABLET | Freq: Every day | ORAL | Status: DC
  Administered 2015-03-14 – 2015-03-16 (×3): 80 mg via ORAL
  Filled 2015-03-14 (×3): qty 1

## 2015-03-14 MED ORDER — HEPARIN SODIUM (PORCINE) 5000 UNIT/ML IJ SOLN
5000.0000 [IU] | Freq: Three times a day (TID) | INTRAMUSCULAR | Status: DC
  Administered 2015-03-14 – 2015-03-15 (×4): 5000 [IU] via SUBCUTANEOUS
  Filled 2015-03-14 (×4): qty 1

## 2015-03-14 MED ORDER — TECHNETIUM TC 99M SESTAMIBI GENERIC - CARDIOLITE
30.0000 | Freq: Once | INTRAVENOUS | Status: AC | PRN
  Administered 2015-03-14: 30 via INTRAVENOUS

## 2015-03-14 MED ORDER — REGADENOSON 0.4 MG/5ML IV SOLN
INTRAVENOUS | Status: AC
  Filled 2015-03-14: qty 5

## 2015-03-14 MED ORDER — SODIUM CHLORIDE 0.9 % IJ SOLN
3.0000 mL | Freq: Two times a day (BID) | INTRAMUSCULAR | Status: DC
  Administered 2015-03-14: 3 mL via INTRAVENOUS

## 2015-03-14 MED ORDER — ASPIRIN 81 MG PO CHEW
81.0000 mg | CHEWABLE_TABLET | Freq: Every day | ORAL | Status: DC
  Administered 2015-03-16: 81 mg via ORAL
  Filled 2015-03-14: qty 1

## 2015-03-14 MED ORDER — CLONAZEPAM 0.5 MG PO TABS
0.5000 mg | ORAL_TABLET | Freq: Every day | ORAL | Status: DC
  Administered 2015-03-14 – 2015-03-15 (×3): 0.5 mg via ORAL
  Filled 2015-03-14 (×3): qty 1

## 2015-03-14 MED ORDER — CARVEDILOL 3.125 MG PO TABS
3.1250 mg | ORAL_TABLET | Freq: Two times a day (BID) | ORAL | Status: DC
  Administered 2015-03-14 – 2015-03-16 (×3): 3.125 mg via ORAL
  Filled 2015-03-14 (×3): qty 1

## 2015-03-14 MED ORDER — ALBUTEROL SULFATE (2.5 MG/3ML) 0.083% IN NEBU: 2.5000 mg | INHALATION_SOLUTION | RESPIRATORY_TRACT | Status: DC | PRN

## 2015-03-14 MED ORDER — ONDANSETRON HCL 4 MG/2ML IJ SOLN
4.0000 mg | Freq: Four times a day (QID) | INTRAMUSCULAR | Status: DC | PRN
  Administered 2015-03-15: 4 mg via INTRAVENOUS
  Filled 2015-03-14: qty 2

## 2015-03-14 MED ORDER — REGADENOSON 0.4 MG/5ML IV SOLN
0.4000 mg | Freq: Once | INTRAVENOUS | Status: AC
  Administered 2015-03-14: 0.4 mg via INTRAVENOUS
  Filled 2015-03-14: qty 5

## 2015-03-14 MED ORDER — ACETAMINOPHEN 325 MG PO TABS
650.0000 mg | ORAL_TABLET | Freq: Four times a day (QID) | ORAL | Status: DC | PRN
  Administered 2015-03-15: 650 mg via ORAL
  Filled 2015-03-14: qty 2

## 2015-03-14 MED ORDER — ONDANSETRON HCL 4 MG PO TABS
4.0000 mg | ORAL_TABLET | Freq: Four times a day (QID) | ORAL | Status: DC | PRN
  Filled 2015-03-14: qty 1

## 2015-03-14 MED ORDER — ASPIRIN 81 MG PO CHEW
324.0000 mg | CHEWABLE_TABLET | Freq: Every day | ORAL | Status: DC
  Administered 2015-03-14: 324 mg via ORAL
  Filled 2015-03-14: qty 4

## 2015-03-14 MED ORDER — NICOTINE 14 MG/24HR TD PT24
14.0000 mg | MEDICATED_PATCH | Freq: Every day | TRANSDERMAL | Status: DC
  Administered 2015-03-14 – 2015-03-16 (×2): 14 mg via TRANSDERMAL
  Filled 2015-03-14 (×2): qty 1

## 2015-03-14 MED ORDER — MORPHINE SULFATE (PF) 2 MG/ML IV SOLN: 2.0000 mg | INTRAVENOUS | Status: DC | PRN

## 2015-03-14 MED ORDER — NITROGLYCERIN 0.4 MG SL SUBL
0.4000 mg | SUBLINGUAL_TABLET | SUBLINGUAL | Status: DC | PRN
Start: 2015-03-14 — End: 2015-03-16
  Administered 2015-03-15 (×2): 0.4 mg via SUBLINGUAL
  Filled 2015-03-14 (×2): qty 1

## 2015-03-14 NOTE — H&P (Signed)
[facility] History and Physical  Navy Belay ZOX:[id number] DOB: 04-14-1950 DOA: 03/14/2015  Referring physician: ED physician PCP: Tillman Abide, MD  Specialists:   Chief Complaint: chest pain  HPI: Franklin Marks is a 64 y.o. male with PMH of CAD, s/p of CABG, hypertension, hyperlipidemia, COPD, tobacco abuse, anxiety, cardiac arrest, atrial fibrillation not on anticoagulants, who presents with chest pain.  Patient reports that he start having chest pain at about 3 AM. The chest pain is located in the substernal area, 5 out of 10 in severity, pressure-like, radiating to the left arm. The chest pain is intermittent, it happens every few minutes, each time lasted for a few minutes. It is associated with sweating. He has mild dry cough. No shortness of breath, nausea, vomiting, abdominal pain, diarrhea, symptoms of UTI, unilateral weakness. After patient took 325 mg aspirin, and 3 sublingual nitroglycerin,  his chest pain has resolved. Currently no chest pain.  In ED, patient was found to have negative troponin, WBC 10.7, bradycardia, electrolytes and renal function okay. CXR has vascular congestion. Patient is admitted to inpatient for further eval and treatment.  Where does patient live?   At home    Can patient participate in ADLs?  Yes   Review of Systems:   General: no fevers, chills, no changes in body weight, has poor appetite, has fatigue HEENT: no blurry vision, hearing changes or sore throat Pulm: no dyspnea, has mild dry coughing, no wheezing CV: has chest pain, palpitations Abd: no nausea, vomiting, abdominal pain, diarrhea, constipation GU: no dysuria, burning on urination, increased urinary frequency, hematuria  Ext: no leg edema Neuro: no unilateral weakness, numbness, or tingling, no vision change or hearing loss Skin: no rash MSK: No muscle spasm, no deformity, no limitation of range of movement in spin Heme: No easy bruising.  Travel history: No recent long  distant travel.  Allergy:  Allergies  Allergen Reactions  . Codeine Nausea And Vomiting    Past Medical History  Diagnosis Date  . CAD (coronary artery disease)     s/p CABG 2001  . COPD (chronic obstructive pulmonary disease) (HCC)     ongoing tobacco use  . GERD (gastroesophageal reflux disease)   . Anemia   . NSTEMI (non-ST elevated myocardial infarction) (HCC) 01/10/2012    Associated with ventricular fibrillation  . Ischemic cardiomyopathy 01/10/2012    LVEF 35-40%, mild conc hypertrophy, servere HK of anteroseptum, borderline RVH  . Atrial fibrillation (HCC) 01/13/2012    Past Surgical History  Procedure Laterality Date  . Cardiac catheterization  2011    At [facility]  . Cardiac catheterization  12/2011    prox LAD occlusion, ostial RCA occlusion, 30% prox LCx stenosis, LIMA-LAD: 80% post-anastamosis lesion, SVG-Dx2: old occlusion w/ thrombus (noted on prior 2011 cath at [facility]), SVG-Dx: patent, SVG-RCA: patent, diffuse irregs, 80-90% PDA/PLA lesions patent  . Coronary artery bypass graft  2001  . Left heart catheterization with coronary angiogram N/A 01/10/2012    Procedure: LEFT HEART CATHETERIZATION WITH CORONARY ANGIOGRAM;  Surgeon: Lennette Bihari, MD;  Location: [facility] CATH LAB;  Service: Cardiovascular;  Laterality: N/A;    Social History:  reports that he has been smoking Cigarettes.  He has a 45 pack-year smoking history. He has never used smokeless tobacco. He reports that he uses illicit drugs (Marijuana). He reports that he does not drink alcohol.  Family History:  Family History  Problem Relation Age of Onset  . Hyperlipidemia Mother   . Alcohol abuse Father   .  Hyperlipidemia Father   . Heart disease Sister   . Hyperlipidemia Sister   . Heart disease Brother   . Hyperlipidemia Brother   . Diabetes Neg Hx   . Hypertension Neg Hx   . Hyperlipidemia Sister      Prior to Admission medications   Medication Sig Start Date End Date Taking? Authorizing Provider   aspirin 81 MG chewable tablet Chew 1 tablet (81 mg total) by mouth daily. 01/15/12  Yes Roger A Arguello, PA-C  carvedilol (COREG) 6.25 MG tablet Take 0.5 tablets (3.125 mg total) by mouth 2 (two) times daily with a meal. 08/09/14  Yes Karie Schwalbeichard I Letvak, MD  clonazePAM (KLONOPIN) 0.5 MG tablet TAKE 1 TABLET BY MOUTH EVERY NIGHT AT BEDTIME AS NEEDED. Patient taking differently: TAKE 1 TABLET BY MOUTH EVERY NIGHT AT BEDTIME AS NEEDED FOR ANXIETY. 01/31/15  Yes Karie Schwalbeichard I Letvak, MD  lisinopril (PRINIVIL,ZESTRIL) 5 MG tablet TAKE 1/2 TABLET BY MOUTH EVERY DAY. 08/09/14  Yes Karie Schwalbeichard I Letvak, MD  nitroGLYCERIN (NITROSTAT) 0.4 MG SL tablet Place 1 tablet (0.4 mg total) under the tongue every 5 (five) minutes as needed for chest pain. 01/22/12  Yes Rosalio MacadamiaLori C Gerhardt, NP  pravastatin (PRAVACHOL) 40 MG tablet Take 1 tablet (40 mg total) by mouth daily. 08/09/14  Yes Karie Schwalbeichard I Letvak, MD    Physical Exam: Filed Vitals:   03/14/15 0444 03/14/15 0449 03/14/15 0452 03/14/15 0500  BP:  130/81  115/75  Pulse:  51  51  Resp:    17  Height:   5\' 10"  (1.778 m)   Weight:   76.204 kg (168 lb)   SpO2: 98% 97%  96%   General: Not in acute distress HEENT:       Eyes: PERRL, EOMI, no scleral icterus.       ENT: No discharge from the ears and nose, no pharynx injection, no tonsillar enlargement.        Neck: No JVD, no bruit, no mass felt. Heme: No neck lymph node enlargement. Cardiac: S1/S2, RRR, No murmurs, No gallops or rubs. Pulm: No rales, wheezing, rhonchi or rubs. Abd: Soft, nondistended, nontender, no rebound pain, no organomegaly, BS present. Ext: No pitting leg edema bilaterally. 2+DP/PT pulse bilaterally. Musculoskeletal: No joint deformities, No joint redness or warmth, no limitation of ROM in spin. Skin: No rashes.  Neuro: Alert, oriented X3, cranial nerves II-XII grossly intact, muscle strength 5/5 in all extremities, sensation to light touch intact. Psych: Patient is not psychotic, no suicidal or  hemocidal ideation.  Labs on Admission:  Basic Metabolic Panel:  Recent Labs Lab 03/14/15 0500 03/14/15 0508  NA 140 140  K 4.3 4.1  CL 106 103  CO2 25  --   GLUCOSE 124* 114*  BUN 17 20  CREATININE 0.95 1.00  CALCIUM 9.2  --    Liver Function Tests: No results for input(s): AST, ALT, ALKPHOS, BILITOT, PROT, ALBUMIN in the last 168 hours. No results for input(s): LIPASE, AMYLASE in the last 168 hours. No results for input(s): AMMONIA in the last 168 hours. CBC:  Recent Labs Lab 03/14/15 0500 03/14/15 0508  WBC 10.7*  --   HGB 13.9 14.3  HCT 41.4 42.0  MCV 92.2  --   PLT 170  --    Cardiac Enzymes: No results for input(s): CKTOTAL, CKMB, CKMBINDEX, TROPONINI in the last 168 hours.  BNP (last 3 results) No results for input(s): BNP in the last 8760 hours.  ProBNP (last 3 results) No results for  input(s): PROBNP in the last 8760 hours.  CBG: No results for input(s): GLUCAP in the last 168 hours.  Radiological Exams on Admission: Dg Chest 2 View  03/14/2015  CLINICAL DATA:  Acute onset of mid to right-sided chest pain. Initial encounter. EXAM: CHEST  2 VIEW COMPARISON:  Chest radiograph performed 01/12/2012 FINDINGS: The lungs are well-aerated. Vascular congestion is noted. Mildly increased interstitial markings are nonspecific. There is no evidence of pleural effusion or pneumothorax. The heart is normal in size; the patient is status post median sternotomy, with evidence of prior CABG. No acute osseous abnormalities are seen. IMPRESSION: Vascular congestion noted. Mildly increased interstitial markings are nonspecific. Lungs otherwise grossly clear. Electronically Signed   By: Roanna Raider M.D.   On: 03/14/2015 05:14    EKG: Independently reviewed. QTC 438, right bundle blockage, which existed in previous EKG on 04/11/14.  Assessment/Plan Principal Problem:   Chest pain Active Problems:   CAD (coronary artery disease)   COPD (chronic obstructive pulmonary  disease) (HCC)   Atrial fibrillation (HCC)   PAD (peripheral artery disease) (HCC)   Hyperlipidemia   Tobacco abuse  Chest pain and CAD: Patient has typical chest pain. He has significant past medical history, including hypertension, hyperlipidemia, CAD, hx of CABG. His Chest has resolved. Currently no chest pain. No Pneumonia on chest x-ray. No signs of DVT, less likely to have pulmonary embolism.  - will admit to Tele bed  - cycle CE q6 x3 and repeat her EKG in the am  - Nitroglycerin, Morphine, Coreg, aspirin - switch pravastatin to lipitor  - Risk factor stratification: will check FLP, UDS and A1C   -2d echo - Need to get cardiology consult in AM  COPD (chronic obstructive pulmonary disease) (HCC): stable -when necessary albuterol nebs -mucinex prn for cough  HLD: Last LDL was 50 on 06/07/13 -Lipitor as above  -Check FLP  Atrial Fibrillation: CHA2DS2-VASc Score is 2, needs oral anticoagulation. Patient is not on AC at home. He is followed up by cardiologist, Dr. Graciela Husbands. Per Dr. Odessa Fleming note on 01/13/12, pt only need ASA. HR is well controled. -continue ASA -On Coreg  Tobacco abuse: -Did counseling about importance of quitting smoking -Nicotine patch   DVT ppx: SQ Heparin    Code Status: Full code Family Communication:  Yes, patient's wife at bed side Disposition Plan: Admit to inpatient   Date of Service 03/14/2015    Lorretta Harp [facility] Pager [phone]  If 7PM-7AM, please contact night-coverage [email or web address] Password [facility] 03/14/2015, 6:13 AM

## 2015-03-14 NOTE — ED Notes (Signed)
Patient to ED from home via [facility] c/o sudden onset substernal CP radiating to L arm starting this morning while lying down, accompanied by a "cold sweat." Pt denies SOB, dizziness, N/V. Patient has significant cardiac hx: 5 MIs and bypasses, one cardiac arrest, last event in 2013. Patient given 324 ASA and 3 NTG with little relief, then given 2mg  morphine, reducing pain from 5/10 to 0. Patient RBBB on monitor, HR 60, BP 120/80, RR 18, and 98% on 2L O2 [place]. Patient denies pain at this time, NAD, respirations e/u, skin warm and dry. Patient also c/o dry non-productive cough x 2-3 days. A&O x 4.

## 2015-03-14 NOTE — Progress Notes (Signed)
Patient presented for Lexiscan. Tolerated procedure well. Result to follow.   Tomasz Steeves, PAC 

## 2015-03-14 NOTE — ED Notes (Signed)
Admitting MD at bedside.

## 2015-03-14 NOTE — Progress Notes (Addendum)
Abnormal stress test results reviewed with Dr. Anne FuSkains since Dr. Mayford Knifeurner is off. Also reviewed with Chelsea AusVin Bhagat PA-C who saw patient earlier. Recommend cath. Risks and benefits of cardiac catheterization have been discussed with the patient. These include bleeding, infection, kidney damage, stroke, heart attack, death. The patient understands these risks and is willing to proceed. 2nd troponin is borderline but patient is pain free. Per d/w MD, hold off heparin per pharmacy unless recurrent pain or next troponin rises further. Also discussed plan with IM. Cath lab says cath will be between 3-4pm tomorrow and has cleared him for clear liquid breakfast.  Ronie Spiesayna Derhonda Eastlick PA-C

## 2015-03-14 NOTE — Consult Note (Signed)
CARDIOLOGY CONSULT NOTE   Patient ID: Franklin Marks MRN: [id number] DOB/AGE: 64-12-1950 64 y.o.  Admit date: 03/14/2015  Primary Physician   Tillman Abide, MD Primary Cardiologist   Dr. Kirke Corin EP: Graciela Husbands Reason for Consultation   Chest pain and bradycardia Requesting physician Dr. Gonzella Lex  HPI: Franklin Marks is a 64 y.o. male with a history of CAD s/p CABG 2001, PAD, PAF, chronic right bundle branch block,  restless leg syndrome, ongoing tobacco and marijuana abuse, COPD, GERD, hypertension, hyperlipidemia who admitted with chest pain.  He has history of cardiac arrest 12/2011 occurring in the setting of coronary artery disease and chest pain. Catheterization demonstrated occlusion of the LAD with a high-grade LIMA LAD anastomosis.  There is thrombus in vein graft to the diagonal. While there are some depression of left ventricular function EF 25-35%, There was subsequent normalization of LV systolic function 12/13. He also had atrial fibrillation at the time of his MI and cardiac arrest. Treated with DAPT with one one. He was thought to be atrial fibrillation been associated with the acute event did not necessitate long-term anticoagulation (Per Dr. Graciela Husbands note 07/16/2012). He has known history of peripheral arterial disease with mildly reduced ABI bilaterally and chronic total occlusion of the SFA on both sides with reconstitution distally via collaterals. Denies cocaine abuse.  Patient states that he smokes marijuana and 4-5 cigarettes per day. Occasional alcohol drinker last usage - 4-5 beers 03/12/2015. He was in usual state of health up until last night around 3 AM when he had a sudden onset of substernal chest pain. It felt like her pressurs/tight,  5/10 severity. Pain was associated with shortness of breath and radiation to his left arm. This pain is similar to when he had a CABG. The pain on and off lasted for about 1 hour and he called EMS. Received sublingual nitroglycerin x 3 and aspirin  325 mg with resolution of pain. No recurrence of chest pain since then. Patient walks half a mile every day without chest pain or shortness of breath.  Lytes normal, WBC 10.7. Troponin x 1 negative. Chest x-ray with vascular congestion. EKG shows sinus rhythm with a bradycardia at rate of 45 bpm, right bundle branch block and first-degree AV block. No changes from previous EKG.  The patient is currently chest pain-free. The patient denies shortness of breath, repetitions, orthopnea, PND, syncope, melena, blood in his stool, nausea, vomiting or lower extremity edema.    Past Medical History  Diagnosis Date  . CAD (coronary artery disease)     s/p CABG 2001  . COPD (chronic obstructive pulmonary disease) (HCC)     ongoing tobacco use  . GERD (gastroesophageal reflux disease)   . Anemia   . NSTEMI (non-ST elevated myocardial infarction) (HCC) 01/10/2012    Associated with ventricular fibrillation  . Ischemic cardiomyopathy 01/10/2012    LVEF 35-40%, mild conc hypertrophy, servere HK of anteroseptum, borderline RVH  . Atrial fibrillation (HCC) 01/13/2012     Past Surgical History  Procedure Laterality Date  . Cardiac catheterization  2011    At [facility]  . Cardiac catheterization  12/2011    prox LAD occlusion, ostial RCA occlusion, 30% prox LCx stenosis, LIMA-LAD: 80% post-anastamosis lesion, SVG-Dx2: old occlusion w/ thrombus (noted on prior 2011 cath at [facility]), SVG-Dx: patent, SVG-RCA: patent, diffuse irregs, 80-90% PDA/PLA lesions patent  . Coronary artery bypass graft  2001  . Left heart catheterization with coronary angiogram N/A 01/10/2012    Procedure: LEFT HEART CATHETERIZATION WITH  CORONARY ANGIOGRAM;  Surgeon: Lennette Bihari, MD;  Location: [facility] CATH LAB;  Service: Cardiovascular;  Laterality: N/A;    Allergies  Allergen Reactions  . Codeine Nausea And Vomiting    I have reviewed the patient's current medications . aspirin  324 mg Oral Daily  . atorvastatin  80 mg Oral  q1800  . carvedilol  3.125 mg Oral BID WC  . clonazePAM  0.5 mg Oral QHS  . heparin  5,000 Units Subcutaneous 3 times per day  . [START ON 03/15/2015] Influenza vac split quadrivalent PF  0.5 mL Intramuscular Tomorrow-1000  . lisinopril  2.5 mg Oral Daily  . nicotine  14 mg Transdermal Daily  . sodium chloride  3 mL Intravenous Q12H   . sodium chloride 75 mL/hr at 03/14/15 7253   acetaminophen **OR** acetaminophen, albuterol, guaiFENesin, morphine injection, nitroGLYCERIN, ondansetron **OR** ondansetron (ZOFRAN) IV  Prior to Admission medications   Medication Sig Start Date End Date Taking? Authorizing Provider  aspirin 81 MG chewable tablet Chew 1 tablet (81 mg total) by mouth daily. 01/15/12  Yes Roger A Arguello, PA-C  carvedilol (COREG) 6.25 MG tablet Take 0.5 tablets (3.125 mg total) by mouth 2 (two) times daily with a meal. 08/09/14  Yes [name], MD  clonazePAM (KLONOPIN) 0.5 MG tablet TAKE 1 TABLET BY MOUTH EVERY NIGHT AT BEDTIME AS NEEDED. Patient taking differently: TAKE 1 TABLET BY MOUTH EVERY NIGHT AT BEDTIME AS NEEDED FOR ANXIETY. 01/31/15  Yes [name], MD  lisinopril (PRINIVIL,ZESTRIL) 5 MG tablet TAKE 1/2 TABLET BY MOUTH EVERY DAY. 08/09/14  Yes [name], MD  nitroGLYCERIN (NITROSTAT) 0.4 MG SL tablet Place 1 tablet (0.4 mg total) under the tongue every 5 (five) minutes as needed for chest pain. 01/22/12  Yes Rosalio Macadamia, NP  pravastatin (PRAVACHOL) 40 MG tablet Take 1 tablet (40 mg total) by mouth daily. 08/09/14  Yes [name], MD     Social History   Social History  . Marital Status: Married    Spouse Name: N/A  . Number of Children: 4  . Years of Education: N/A   Occupational History  . Management --now disabled    Social History Main Topics  . Smoking status: Current Some Day Smoker -- 1.00 packs/day for 45 years    Types: Cigarettes    Last Attempt to Quit: 01/10/2012  . Smokeless tobacco: Never Used     Comment: 1 every 2-3  days. wife still smokes  . Alcohol Use: No  . Drug Use: Yes    Special: Marijuana  . Sexual Activity: Not Currently   Other Topics Concern  . Not on file   Social History Narrative   Married twice   3 children from 1st marriage-- 1 from second   Worked as Investment banker, operational    Family Status  Relation Status Death Age  . Mother Deceased 80    NASH  . Father Deceased 81    gangrene of intestines  . Sister Deceased 75    MI  . Brother Deceased 60    heart disease  . Sister Alive    Family History  Problem Relation Age of Onset  . Hyperlipidemia Mother   . Alcohol abuse Father   . Hyperlipidemia Father   . Heart disease Sister   . Hyperlipidemia Sister   . Heart disease Brother   . Hyperlipidemia Brother   . Diabetes Neg Hx   . Hypertension Neg Hx   . Hyperlipidemia  Sister     ROS:  Full 14 point review of systems complete and found to be negative unless listed above.  Physical Exam: Blood pressure 110/64, pulse 45, temperature 97.6 F (36.4 C), temperature source Oral, resp. rate 19, height  (1.778 m), weight 172 lb 2.9 oz (78.1 kg), SpO2 97 %.  General: Well developed, well nourished, male in no acute distress Head: Eyes PERRLA, No xanthomas. Normocephalic and atraumatic, oropharynx without edema or exudate.  Lungs: Resp regular and unlabored, CTA. Heart: RRR no s3, s4, or murmurs..   Neck: No carotid bruits. No lymphadenopathy.  No JVD. Abdomen: Bowel sounds present, abdomen soft and non-tender without masses or hernias noted. Msk:  No spine or cva tenderness. No weakness, no joint deformities or effusions. Extremities: No clubbing, cyanosis or edema. DP/PT/Radials 2+ and equal bilaterally. Neuro: Alert and oriented X 3. No focal deficits noted. Psych:  Good affect, responds appropriately Skin: No rashes or lesions noted.  Labs:   Lab Results  Component Value Date   WBC 10.7* 03/14/2015   HGB 14.3 03/14/2015   HCT 42.0 03/14/2015   MCV 92.2  03/14/2015   PLT 170 03/14/2015    Recent Labs  03/14/15 0624  INR 1.09    Recent Labs Lab 03/14/15 0500 03/14/15 0508  NA 140 140  K 4.3 4.1  CL 106 103  CO2 25  --   BUN 17 20  CREATININE 0.95 1.00  CALCIUM 9.2  --   GLUCOSE 124* 114*   MAGNESIUM  Date Value Ref Range Status  01/12/2012 1.9 1.5 - 2.5 mg/dL Final    Recent Labs  40/98/11 0624  TROPONINI 0.03    Recent Labs  03/14/15 0458  TROPIPOC 0.01    Echo: 02/2012 LV EF: 60%  ------------------------------------------------------------ Indications:   CHF - 428.0.  ------------------------------------------------------------ History:  PMH: NSTEMI. V fib arrest. Acquired from the patient and from the patient's chart. Paroxysmal Atrial fibrillation. Coronary artery disease. Congestive heart failure. Ischemic cardiomyopathy. Chronic obstructive pulmonary disease. Risk factors: Former tobacco use. Dyslipidemia.  ------------------------------------------------------------ Study Conclusions  - Left ventricle: Moderate hypokinesis at the base of the inferior wall. The cavity size was normal. Wall thickness was increased in a pattern of mild LVH. The estimated ejection fraction was 60%. - Left atrium: The atrium was mildly dilated. - Right ventricle: The cavity size was at the upper limits of normal. Systolic function was mildly reduced. - Right atrium: The atrium was mildly dilated. - Pulmonary arteries: PA peak pressure: 31mm Hg (S).   ECG: Vent. rate 45 BPM PR interval 240 ms QRS duration 140 ms QT/QTc 500/432 ms P-R-T axes 42 37 59  Cath 01/11/2012 ANGIOGRAPHIC DATA: Left main coronary artery was a long normal vessel, which bifurcated into an LAD and left circumflex system.  The LAD was totally occluded proximally.  The circumflex vessel was then bypass and had 30% narrowing in its proximal segment.  The right coronary artery was totally occluded at its  origin.  The vein graft supplying the right coronary artery was widely patent. The native RCA was diffusely diseased. There was filling antegrade up to the mid RCA where there was 90% stenosis. Distal to the graft anastomosis after the PDA vessel, there was diffuse 80% stenosis and then in the PLA vessel, there was 80% to 90% stenosis.  The most superior vein graft was filled with clot and this is old and apparently was noted in the report from December 2011 done at [facility] The 2nd  vein graft supplied the diagonal vessel which was widely patent, and had mild 20% narrowing proximally.  LIMA graft was widely patent and anastomosed into the mid LAD. Distal to the LIMA insertion, there was 80% LAD stenosis. Antegrade to the main insertion, the LAD was diffusely diseased proximally with diffuse 80% stenosis in the region of the septal proximal septal perforating artery.  RAO ventriculography revealed ejection fraction of approximately 25%. There was severe hypokinesis of the mid inferior wall and akinesis anterolaterally.  IMPRESSION: 1. Ischemic cardiomyopathy with ejection fraction of approximately 25%  with focal akinesis of the anterolateral wall and severe inferior  hypokinesis. 2. Severe native coronary artery disease with total occlusion of the  proximal left anterior descending, 20% to 30% circumflex stenosis  in the bypass circumflex, and total occlusion of the right coronary  artery at its origin. 3. Patent left internal mammary artery graft to the left anterior  descending, but with 80% stenosis in the left anterior descending  beyond the graft insertion. 4. Old occlusion of a clot laden vein graft which had supplied the  second diagonal vessel. 5. Patent vein graft supplying the first diagonal vessel with mild 20%  narrowing. 6. Patent vein graft supplying the right coronary artery with native  distal RCA diffuse disease with 80%  to 90% stenosis distally after  the PDA takeoff and 80% to 90% in the PLA vessel.    Radiology:  Dg Chest 2 View  03/14/2015  CLINICAL DATA:  Acute onset of mid to right-sided chest pain. Initial encounter. EXAM: CHEST  2 VIEW COMPARISON:  Chest radiograph performed 01/12/2012 FINDINGS: The lungs are well-aerated. Vascular congestion is noted. Mildly increased interstitial markings are nonspecific. There is no evidence of pleural effusion or pneumothorax. The heart is normal in size; the patient is status post median sternotomy, with evidence of prior CABG. No acute osseous abnormalities are seen. IMPRESSION: Vascular congestion noted. Mildly increased interstitial markings are nonspecific. Lungs otherwise grossly clear. Electronically Signed   By: Roanna Raider M.D.   On: 03/14/2015 05:14    ASSESSMENT AND PLAN:     1. Chest pain - His chest pain is similar to his previous cardiac pain when he had a CABG. Responsive to sublingual nitroglycerin. Currently chest pain-free. - EKG without acute changes. Troponin x 1 negative. Will Do Myoview today.  - Continue ASA, statin, BB and ACE  2. CAD s/p CABG 2001 x 4 -history of cardiac arrest 12/2011 occurring in the setting of coronary artery disease. Catheterization revealed prox LAD occlusion, ostial RCA occlusion, 30% prox LCx stenosis, LIMA-LAD: 80% post-anastamosis lesion, SVG-Dx2: old occlusion w/ thrombus (noted on prior 2011 cath at [facility]), SVG-Dx: patent, SVG-RCA: patent, diffuse irregs, 80-90% PDA/PLA lesions patent. No culprit lesions identified or intervened upon. While there are some depression of left ventricular function EF 25-35%, There was subsequent normalization of LV systolic function 12/13.  3. PAF - He also had atrial fibrillation at the time of his MI and cardiac arrest (12/2011). Treated with DAPT with one one. He was thought to be atrial fibrillation been associated with the acute event did not necessitate long-term  anticoagulation (Per Dr. Graciela Husbands note 07/16/2012).  - No reoccurrence. CHADSVASCS score of 2 (HTN and vascular disease)   4. PAD - Mdly reduced ABI bilaterally and chronic total occlusion of the SFA on both sides with reconstitution distally via collaterals.  - No claudication symptoms  5. Sinus bradycardia - hx of RBBB and 1st degree AV block. Tele with  rate mostly in mid 40s to 50s. Transiently 39. No syncope.  6. Tobacco and marijuana abuse - Strongly encouraged sensation. Spent more than 10 minutes for education and importance of avoidance.   7. HL -03/14/2015: Cholesterol 137; HDL 27*; LDL Cholesterol 51; Triglycerides 293*; VLDL 59*  - Continue statin  8. HTN - Stable and well controlled  9. COPD (chronic obstructive pulmonary disease) (HCC)  10. GERD - Recently uncontrolled. Could be the etiology of his chest pain. Will add Protonix.   SignedManson Passey: Girtha Kilgore, PA 03/14/2015, 10:17 AM Pager [phone]  Co-Sign MD

## 2015-03-14 NOTE — Progress Notes (Signed)
[facility] PROGRESS NOTE  Franklin PartyLarry Marks UJW:[id number]:[id number] DOB: [birth year] DOA: 03/14/2015 PCP: Tillman Abideichard Letvak, MD  Brief narrative 64 year old male with history of coronary artery disease status post CABG in 2001, ischemic cardiomyopathy, history of NSTEMI associated with ventricular fibrillation in 2013,  hypertension, hyperlipidemia, chronic asymptomatic bradycardia with first-degree AV block , COPD with ongoing tobacco use, anxiety, A. fib not on anticoagulation ,  presented with acute onset of chest pain early this morning, substernal, 5/10 in severity radiating to the left arm. Symptoms were intermittent occurring every few minutes and associated with diaphoresis. Denied any shortness of breath, nausea, vomiting, abdominal pain, palpitations, bowel or urinary symptoms. Chest pain resolved after taking full dose aspirin and 3 sublingual nitroglycerin. In the ED EKG was unremarkable with negative troponin. His heart rate was in the 40s. Admitted to rule out for ACS. Patient seen by cardiology and underwent Lexiscan Myoview which was abnormal.   Assessment/Plan: NSTEMI/ unstable angina Troponin peaked at 0.06. Patient currently chest pain-free. Continue aspirin and sublingual nitrate as needed. Continue low-dose Coreg (hold if heart rate less than 40). Continue Lipitor and low-dose lisinopril. Lexiscan Myoview showing median defect in the basal inferior and mid inferior location and of medium defect in the basal anterior lateral and mid anterolateral location. Both of moderate severity. Left ventricular EF of 45-54%. Given high risk stress test result is a civil multivessel coronary artery disease cardiology recommended on cardiac cath tomorrow afternoon around 3-4 PM. -Cardiology recommend clear liquid breakfast in the morning and nothing by mouth after that.  Tobacco abuse Counseled strongly on cessation. Continue nicotine patch. Patient wishes to quit. Reports had been cutting  down.  History of cardiac arrest   Sinus bradycardia Asymptomatic. Patient has chronic sinus bradycardia with first-degree AV block on EKG. Heart rate in the range of 40-50. Continue to monitor. Is on low-dose beta blocker.  Peripheral arterial disease Continue aspirin and statin. Has chronically occluded SFA bilaterally. Denies claudication. Counseled again on smoking cessation.  Diet: Heart healthy, nothing by mouth after clear liquid breakfast in the morning  Code Status: Full code Family Communication: Daughter at bedside Disposition Plan: Currently inpatient.   Consultants:  Cardiology  Procedures:  Lexiscan Myoview  Antibiotics:  None  HPI/Subjective: Patient seen and examined. Denies further chest pain symptoms. Discussed abnormal stress test results and plan for cardiac cath tomorrow.  Objective: Filed Vitals:   03/14/15 1255 03/14/15 1257  BP: 155/92 150/89  Pulse:    Temp:    Resp:      Intake/Output Summary (Last 24 hours) at 03/14/15 1602 Last data filed at 03/14/15 [id number]  Gross per 24 hour  Intake      0 ml  Output    400 ml  Net   -400 ml   Filed Weights   03/14/15 0452 03/14/15 0648  Weight: 76.204 kg (168 lb) 78.1 kg (172 lb 2.9 oz)    Exam:   General: Middle aged male not in distress  HEENT: No pallor, moist mucosa, poor dentition  Chest: Clear bilaterally  Cardiovascular: S1 and S2 bradycardic, no murmurs rubs or gallop  GI: Soft, nondistended, nontender  Musculoskeletal: Warm, no edema  Data Reviewed: Basic Metabolic Panel:  Recent Labs Lab 03/14/15 0500 03/14/15 0508  NA 140 140  K 4.3 4.1  CL 106 103  CO2 25  --   GLUCOSE 124* 114*  BUN 17 20  CREATININE 0.95 1.00  CALCIUM 9.2  --    Liver Function Tests: No results for  input(s): AST, ALT, ALKPHOS, BILITOT, PROT, ALBUMIN in the last 168 hours. No results for input(s): LIPASE, AMYLASE in the last 168 hours. No results for input(s): AMMONIA in the last 168  hours. CBC:  Recent Labs Lab 03/14/15 0500 03/14/15 0508  WBC 10.7*  --   HGB 13.9 14.3  HCT 41.4 42.0  MCV 92.2  --   PLT 170  --    Cardiac Enzymes:  Recent Labs Lab 03/14/15 0624 03/14/15 1135  TROPONINI 0.03 0.06*   BNP (last 3 results) No results for input(s): BNP in the last 8760 hours.  ProBNP (last 3 results) No results for input(s): PROBNP in the last 8760 hours.  CBG:  Recent Labs Lab 03/14/15 0700  GLUCAP 132*    No results found for this or any previous visit (from the past 240 hour(s)).   Studies: Dg Chest 2 View  03/14/2015  CLINICAL DATA:  Acute onset of mid to right-sided chest pain. Initial encounter. EXAM: CHEST  2 VIEW COMPARISON:  Chest radiograph performed 01/12/2012 FINDINGS: The lungs are well-aerated. Vascular congestion is noted. Mildly increased interstitial markings are nonspecific. There is no evidence of pleural effusion or pneumothorax. The heart is normal in size; the patient is status post median sternotomy, with evidence of prior CABG. No acute osseous abnormalities are seen. IMPRESSION: Vascular congestion noted. Mildly increased interstitial markings are nonspecific. Lungs otherwise grossly clear. Electronically Signed   By: Roanna Raider M.D.   On: 03/14/2015 05:14   Nm Myocar Multi W/spect W/wall Motion / Ef  03/14/2015   There was no ST segment deviation noted during stress.  T wave inversion was noted during stress in the I and V4 leads, beginning at 1 minutes of stress, ending at 3 minutes of stress.  Defect 1: There is a medium defect of moderate severity present in the basal inferior and mid inferior location.  Defect 2: There is a medium defect of moderate severity present in the basal anterolateral and mid anterolateral location.  This is a high risk study.  The left ventricular ejection fraction is mildly decreased (45-54%).  Findings consistent with ischemia.  High risk stress nuclear study with ischemia suggesting  multivessel CAD and mildly reduced global systolic function.    Scheduled Meds: . [START ON 03/15/2015] aspirin  81 mg Oral Daily  . atorvastatin  80 mg Oral q1800  . carvedilol  3.125 mg Oral BID WC  . clonazePAM  0.5 mg Oral QHS  . heparin  5,000 Units Subcutaneous 3 times per day  . [START ON 03/15/2015] Influenza vac split quadrivalent PF  0.5 mL Intramuscular Tomorrow-1000  . lisinopril  2.5 mg Oral Daily  . nicotine  14 mg Transdermal Daily  . pantoprazole  40 mg Oral Daily  . regadenoson      . sodium chloride  3 mL Intravenous Q12H   Continuous Infusions:      Time spent: 20 minutes    Gwenevere Goga  [facility] Pager [phone] If 7PM-7AM, please contact night-coverage at [email or web address], password [email or web address] 03/14/2015, 4:02 PM  LOS: 0 days

## 2015-03-14 NOTE — Progress Notes (Signed)
Echocardiogram 2D Echocardiogram has been performed.  Dorothey BasemanReel, Allisha Harter M 03/14/2015, 2:48 PM

## 2015-03-14 NOTE — Progress Notes (Signed)
Remains w/o chest pain, denies complaints. EKG 12 lead complete. Monitor indicates heart rate remains in 40's. Paged Dr. Gonzella Lexhungel - stated bradycardia, will be evaluated by a cardiologist today. Set parameters @ 40.

## 2015-03-14 NOTE — ED Provider Notes (Signed)
CSN: [id number]     Arrival date & time 03/14/15  0444 History   First MD Initiated Contact with Patient 03/14/15 [phone]     Chief Complaint  Patient presents with  . Chest Pain     (Consider location/radiation/quality/duration/timing/severity/associated sxs/prior Treatment) HPI   Franklin Marks is a 64 y.o. male with PMH of CAD s/p CABG and ICM, here with L sided CP while at rest.  He has SOB and diaphoresis as well. States this pain feels just like his last heart attack pain.  Pain was relieved by EMS with 3 nitro and morphine.  Patient received ASA as well.  He denies any recent fevers and infections.  There are no further complaints.  10 Systems reviewed and are negative for acute change except as noted in the HPI.    Past Medical History  Diagnosis Date  . CAD (coronary artery disease)     s/p CABG 2001  . COPD (chronic obstructive pulmonary disease) (HCC)     ongoing tobacco use  . GERD (gastroesophageal reflux disease)   . Anemia   . NSTEMI (non-ST elevated myocardial infarction) (HCC) 01/10/2012    Associated with ventricular fibrillation  . Ischemic cardiomyopathy 01/10/2012    LVEF 35-40%, mild conc hypertrophy, servere HK of anteroseptum, borderline RVH  . Atrial fibrillation (HCC) 01/13/2012   Past Surgical History  Procedure Laterality Date  . Cardiac catheterization  2011    At [facility]  . Cardiac catheterization  12/2011    prox LAD occlusion, ostial RCA occlusion, 30% prox LCx stenosis, LIMA-LAD: 80% post-anastamosis lesion, SVG-Dx2: old occlusion w/ thrombus (noted on prior 2011 cath at [facility]), SVG-Dx: patent, SVG-RCA: patent, diffuse irregs, 80-90% PDA/PLA lesions patent  . Coronary artery bypass graft  2001  . Left heart catheterization with coronary angiogram N/A 01/10/2012    Procedure: LEFT HEART CATHETERIZATION WITH CORONARY ANGIOGRAM;  Surgeon: Lennette Bihari, MD;  Location: [facility] CATH LAB;  Service: Cardiovascular;  Laterality: N/A;   Family History  Problem  Relation Age of Onset  . Hyperlipidemia Mother   . Alcohol abuse Father   . Hyperlipidemia Father   . Heart disease Sister   . Hyperlipidemia Sister   . Heart disease Brother   . Hyperlipidemia Brother   . Diabetes Neg Hx   . Hypertension Neg Hx   . Hyperlipidemia Sister    Social History  Substance Use Topics  . Smoking status: Current Some Day Smoker -- 1.00 packs/day for 45 years    Types: Cigarettes    Last Attempt to Quit: 01/10/2012  . Smokeless tobacco: Never Used     Comment: 1 every 2-3 days. wife still smokes  . Alcohol Use: No    Review of Systems    Allergies  Codeine  Home Medications   Prior to Admission medications   Medication Sig Start Date End Date Taking? Authorizing Provider  aspirin 81 MG chewable tablet Chew 1 tablet (81 mg total) by mouth daily. 01/15/12   Roger A Arguello, PA-C  carvedilol (COREG) 6.25 MG tablet Take 0.5 tablets (3.125 mg total) by mouth 2 (two) times daily with a meal. 08/09/14   [name], MD  clonazePAM (KLONOPIN) 0.5 MG tablet TAKE 1 TABLET BY MOUTH EVERY NIGHT AT BEDTIME AS NEEDED. 01/31/15   [name], MD  lisinopril (PRINIVIL,ZESTRIL) 5 MG tablet TAKE 1/2 TABLET BY MOUTH EVERY DAY. 08/09/14   [name], MD  nitroGLYCERIN (NITROSTAT) 0.4 MG SL tablet Place 1 tablet (0.4 mg total)  under the tongue every 5 (five) minutes as needed for chest pain. 01/22/12   Rosalio Macadamia, NP  pravastatin (PRAVACHOL) 40 MG tablet Take 1 tablet (40 mg total) by mouth daily. 08/09/14   [name], MD  zoster vaccine live, PF, (ZOSTAVAX) 14782 UNT/0.65ML injection Inject 19,400 Units into the skin once. 06/16/14   [name], MD   BP 130/81 mmHg  Pulse 51  Ht  (1.778 m)  Wt 168 lb (76.204 kg)  BMI 24.11 kg/m2  SpO2 97% Physical Exam  Constitutional: He is oriented to person, place, and time. Vital signs are normal. He appears well-developed and well-nourished.  Non-toxic appearance. He does not appear ill. No  distress.  HENT:  Head: Normocephalic and atraumatic.  Nose: Nose normal.  Mouth/Throat: Oropharynx is clear and moist. No oropharyngeal exudate.  Eyes: Conjunctivae and EOM are normal. Pupils are equal, round, and reactive to light. No scleral icterus.  Neck: Normal range of motion. Neck supple. No tracheal deviation, no edema, no erythema and normal range of motion present. No thyroid mass and no thyromegaly present.  Cardiovascular: Normal rate, regular rhythm, S1 normal, S2 normal, normal heart sounds, intact distal pulses and normal pulses.  Exam reveals no gallop and no friction rub.   No murmur heard. Pulmonary/Chest: Effort normal and breath sounds normal. No respiratory distress. He has no wheezes. He has no rhonchi. He has no rales.  Abdominal: Soft. Normal appearance and bowel sounds are normal. He exhibits no distension, no ascites and no mass. There is no hepatosplenomegaly. There is no tenderness. There is no rebound, no guarding and no CVA tenderness.  Musculoskeletal: Normal range of motion. He exhibits no edema or tenderness.  Lymphadenopathy:    He has no cervical adenopathy.  Neurological: He is alert and oriented to person, place, and time. He has normal strength. No cranial nerve deficit or sensory deficit.  Skin: Skin is warm, dry and intact. No petechiae and no rash noted. He is not diaphoretic. No erythema. No pallor.  Psychiatric: He has a normal mood and affect. His behavior is normal. Judgment normal.  Nursing note and vitals reviewed.   ED Course  Procedures (including critical care time) Labs Review Labs Reviewed  BASIC METABOLIC PANEL - Abnormal; Notable for the following:    Glucose, Bld 124 (*)    All other components within normal limits  CBC - Abnormal; Notable for the following:    WBC 10.7 (*)    All other components within normal limits  I-STAT CHEM 8, ED - Abnormal; Notable for the following:    Glucose, Bld 114 (*)    All other components within  normal limits  I-STAT TROPOININ, ED    Imaging Review Dg Chest 2 View  03/14/2015  CLINICAL DATA:  Acute onset of mid to right-sided chest pain. Initial encounter. EXAM: CHEST  2 VIEW COMPARISON:  Chest radiograph performed 01/12/2012 FINDINGS: The lungs are well-aerated. Vascular congestion is noted. Mildly increased interstitial markings are nonspecific. There is no evidence of pleural effusion or pneumothorax. The heart is normal in size; the patient is status post median sternotomy, with evidence of prior CABG. No acute osseous abnormalities are seen. IMPRESSION: Vascular congestion noted. Mildly increased interstitial markings are nonspecific. Lungs otherwise grossly clear. Electronically Signed   By: Roanna Raider M.D.   On: 03/14/2015 05:14   I have personally reviewed and evaluated these images and lab results as part of my medical decision-making.   EKG Interpretation  Date/Time:  Tuesday March 14 2015 04:51:14 EST Ventricular Rate:  52 PR Interval:  200 QRS Duration: 153 QT Interval:  471 QTC Calculation: 438 R Axis:   63 Text Interpretation:  Sinus rhythm Right bundle branch block Baseline  wander in lead(s) V2 No significant change since last tracing Confirmed by  Erroll Lunani, Jaquan Sadowsky [place]unle [phone] on 03/14/2015 5:00:49 AM      MDM   Final diagnoses:  None    Patient presents to the ED for CP.  His history is concerning for ACS.  Plan to admit to hospitalist for rule out.  Tomasita CrumbleAdeleke Teo Moede, MD 03/14/15 [phone]

## 2015-03-14 NOTE — Progress Notes (Signed)
UR Completed. Jujhar Everett, RN, BSN.  [phone] 

## 2015-03-14 NOTE — Progress Notes (Signed)
OT Cancellation Note  Patient Details Name: Franklin Marks MRN: [id number] DOB: [birth year]   Cancelled Treatment:    Reason Eval/Treat Not Completed: OT screened, no needs identified, will sign off  Earlie RavelingStraub, Theo Reither L OTR/L [phone] 03/14/2015, 3:48 PM

## 2015-03-14 NOTE — Progress Notes (Signed)
With the family's permission, spoke with a representative of the [employer] to verify Patient's condition - his grand[place] [employer] verify to arrange leave.

## 2015-03-14 NOTE — Evaluation (Signed)
Physical Therapy Evaluation Patient Details Name: Franklin Marks MRN: [id number] DOB: [birth year] Today's Date: 03/14/2015   History of Present Illness  Franklin Marks is a 64 y.o. male with PMH of CAD, s/p of CABG, hypertension, hyperlipidemia, COPD, tobacco abuse, anxiety, cardiac arrest, atrial fibrillation not on anticoagulants, who presents with chest pain.  Clinical Impression  Pt is at or close to baseline functioning and should be safe at home independently. There are no further acute PT needs.  Will sign off at this time.     Follow Up Recommendations No PT follow up    Equipment Recommendations  None recommended by PT    Recommendations for Other Services       Precautions / Restrictions Precautions Precautions: None      Mobility  Bed Mobility Overal bed mobility: Independent                Transfers Overall transfer level: Independent                  Ambulation/Gait Ambulation/Gait assistance: Independent         Gait velocity interpretation: at or above normal speed for age/gender General Gait Details: WFL  Stairs Stairs: Yes   Stair Management: One rail Right;Alternating pattern;Forwards Number of Stairs: 6 General stair comments: safe and fluid  Wheelchair Mobility    Modified Rankin (Stroke Patients Only)       Balance                                 Standardized Balance Assessment Standardized Balance Assessment : Dynamic Gait Index   Dynamic Gait Index Level Surface: Normal Change in Gait Speed: Normal Gait with Horizontal Head Turns: Normal Gait with Vertical Head Turns: Normal Gait and Pivot Turn: Normal Step Over Obstacle: Normal Step Around Obstacles: Normal Steps: Mild Impairment Total Score: 23       Pertinent Vitals/Pain Pain Assessment: No/denies pain    Home Living Family/patient expects to be discharged to:: Private residence Living Arrangements: Spouse/significant other Available Help  at Discharge: Family Type of Home: Mobile home Home Access: Stairs to enter Entrance Stairs-Rails: Doctor, general practiceight;Left Entrance Stairs-Number of Steps: 4 Home Layout: One level Home Equipment: None      Prior Function Level of Independence: Independent               Hand Dominance        Extremity/Trunk Assessment   Upper Extremity Assessment: Overall WFL for tasks assessed           Lower Extremity Assessment: Overall WFL for tasks assessed      Cervical / Trunk Assessment: Normal  Communication   Communication: No difficulties  Cognition Arousal/Alertness: Awake/alert Behavior During Therapy: WFL for tasks assessed/performed Overall Cognitive Status: Within Functional Limits for tasks assessed                      General Comments General comments (skin integrity, edema, etc.): SpO2 on RA during gait  97% and EHR 83 bpm    Exercises        Assessment/Plan    PT Assessment Patent does not need any further PT services  PT Diagnosis     PT Problem List    PT Treatment Interventions     PT Goals (Current goals can be found in the Care Plan section) Acute Rehab PT Goals PT Goal Formulation: All assessment and education complete, DC  therapy    Frequency     Barriers to discharge        Co-evaluation               End of Session   Activity Tolerance: Patient tolerated treatment well Patient left: in chair;with call bell/phone within reach;with family/visitor present Nurse Communication: Mobility status         Time: 0865-7846 PT Time Calculation (min) (ACUTE ONLY): 16 min   Charges:   PT Evaluation $Initial PT Evaluation Tier I: 1 Procedure     PT G Codes:        Franklin Marks, Eliseo Gum 03/14/2015, 3:39 PM  03/14/2015  [place] Bing, PT [phone]  (pager)

## 2015-03-15 ENCOUNTER — Encounter (HOSPITAL_COMMUNITY)
Admission: EM | Disposition: A | Discharge status: Home / Self Care | Payer: Self-pay | Source: Home / Self Care | Attending: Internal Medicine

## 2015-03-15 ENCOUNTER — Encounter (HOSPITAL_COMMUNITY): Payer: Self-pay | Admitting: Cardiovascular Disease

## 2015-03-15 DIAGNOSIS — I2510 Atherosclerotic heart disease of native coronary artery without angina pectoris: Secondary | ICD-10-CM

## 2015-03-15 DIAGNOSIS — R9439 Abnormal result of other cardiovascular function study: Secondary | ICD-10-CM

## 2015-03-15 DIAGNOSIS — I739 Peripheral vascular disease, unspecified: Secondary | ICD-10-CM

## 2015-03-15 DIAGNOSIS — I209 Angina pectoris, unspecified: Secondary | ICD-10-CM

## 2015-03-15 DIAGNOSIS — Z720 Tobacco use: Secondary | ICD-10-CM

## 2015-03-15 HISTORY — PX: CARDIAC CATHETERIZATION: SHX172

## 2015-03-15 LAB — COMPREHENSIVE METABOLIC PANEL
ALT: 18 U/L (ref 17–63)
ANION GAP: 9 (ref 5–15)
AST: 16 U/L (ref 15–41)
Albumin: 3.5 g/dL (ref 3.5–5.0)
Alkaline Phosphatase: 66 U/L (ref 38–126)
BILIRUBIN TOTAL: 0.7 mg/dL (ref 0.3–1.2)
BUN: 10 mg/dL (ref 6–20)
CHLORIDE: 107 mmol/L (ref 101–111)
CO2: 24 mmol/L (ref 22–32)
Calcium: 9 mg/dL (ref 8.9–10.3)
Creatinine, Ser: 0.94 mg/dL (ref 0.61–1.24)
Glucose, Bld: 125 mg/dL — ABNORMAL HIGH (ref 65–99)
POTASSIUM: 3.9 mmol/L (ref 3.5–5.1)
Sodium: 140 mmol/L (ref 135–145)
TOTAL PROTEIN: 6.5 g/dL (ref 6.5–8.1)

## 2015-03-15 LAB — CBC
HEMATOCRIT: 39.2 % (ref 39.0–52.0)
HEMOGLOBIN: 13.2 g/dL (ref 13.0–17.0)
MCH: 31.1 pg (ref 26.0–34.0)
MCHC: 33.7 g/dL (ref 30.0–36.0)
MCV: 92.5 fL (ref 78.0–100.0)
Platelets: 146 10*3/uL — ABNORMAL LOW (ref 150–400)
RBC: 4.24 MIL/uL (ref 4.22–5.81)
RDW: 12.9 % (ref 11.5–15.5)
WBC: 7 10*3/uL (ref 4.0–10.5)

## 2015-03-15 LAB — HEMOGLOBIN A1C
HEMOGLOBIN A1C: 5.7 % — AB (ref 4.8–5.6)
Mean Plasma Glucose: 117 mg/dL

## 2015-03-15 LAB — POCT ACTIVATED CLOTTING TIME: ACTIVATED CLOTTING TIME: 353 s

## 2015-03-15 LAB — GLUCOSE, CAPILLARY
GLUCOSE-CAPILLARY: 106 mg/dL — AB (ref 65–99)
GLUCOSE-CAPILLARY: 108 mg/dL — AB (ref 65–99)
Glucose-Capillary: 117 mg/dL — ABNORMAL HIGH (ref 65–99)
Glucose-Capillary: 137 mg/dL — ABNORMAL HIGH (ref 65–99)

## 2015-03-15 SURGERY — LEFT HEART CATH AND CORS/GRAFTS ANGIOGRAPHY
Anesthesia: LOCAL

## 2015-03-15 MED ORDER — HYDRALAZINE HCL 20 MG/ML IJ SOLN
20.0000 mg | INTRAMUSCULAR | Status: DC | PRN
  Administered 2015-03-15: 13:00:00 20 mg via INTRAVENOUS
  Filled 2015-03-15: qty 1

## 2015-03-15 MED ORDER — FENTANYL CITRATE (PF) 100 MCG/2ML IJ SOLN
INTRAMUSCULAR | Status: DC | PRN
  Administered 2015-03-15 (×3): 25 ug via INTRAVENOUS

## 2015-03-15 MED ORDER — BIVALIRUDIN BOLUS VIA INFUSION - CUPID
INTRAVENOUS | Status: DC | PRN
  Administered 2015-03-15: 57.75 mg via INTRAVENOUS

## 2015-03-15 MED ORDER — MIDAZOLAM HCL 2 MG/2ML IJ SOLN
INTRAMUSCULAR | Status: DC | PRN
  Administered 2015-03-15: 2 mg via INTRAVENOUS
  Administered 2015-03-15: 1 mg via INTRAVENOUS

## 2015-03-15 MED ORDER — CLOPIDOGREL BISULFATE 300 MG PO TABS
ORAL_TABLET | ORAL | Status: AC
  Filled 2015-03-15: qty 2

## 2015-03-15 MED ORDER — SODIUM CHLORIDE 0.9 % IJ SOLN: 3.0000 mL | INTRAMUSCULAR | Status: DC | PRN

## 2015-03-15 MED ORDER — ASPIRIN 81 MG PO CHEW
CHEWABLE_TABLET | ORAL | Status: DC | PRN
  Administered 2015-03-15: 81 mg via ORAL

## 2015-03-15 MED ORDER — SODIUM CHLORIDE 0.9 % IV SOLN: 250.0000 mL | INTRAVENOUS | Status: DC | PRN

## 2015-03-15 MED ORDER — VERAPAMIL HCL 2.5 MG/ML IV SOLN
INTRAVENOUS | Status: AC
  Filled 2015-03-15: qty 2

## 2015-03-15 MED ORDER — ASPIRIN 81 MG PO CHEW
CHEWABLE_TABLET | ORAL | Status: AC
  Filled 2015-03-15: qty 1

## 2015-03-15 MED ORDER — LIDOCAINE HCL (PF) 1 % IJ SOLN
INTRAMUSCULAR | Status: AC
  Filled 2015-03-15: qty 30

## 2015-03-15 MED ORDER — CLOPIDOGREL BISULFATE 75 MG PO TABS
75.0000 mg | ORAL_TABLET | Freq: Every day | ORAL | Status: DC
  Administered 2015-03-16: 75 mg via ORAL
  Filled 2015-03-15: qty 1

## 2015-03-15 MED ORDER — MIDAZOLAM HCL 2 MG/2ML IJ SOLN
INTRAMUSCULAR | Status: AC
  Filled 2015-03-15: qty 2

## 2015-03-15 MED ORDER — HEART ATTACK BOUNCING BOOK
Freq: Once | Status: AC
  Administered 2015-03-15: 21:00:00
  Filled 2015-03-15: qty 1

## 2015-03-15 MED ORDER — HEPARIN (PORCINE) IN NACL 2-0.9 UNIT/ML-% IJ SOLN
INTRAMUSCULAR | Status: DC | PRN
  Administered 2015-03-15: 11:00:00

## 2015-03-15 MED ORDER — LIDOCAINE HCL (PF) 1 % IJ SOLN: INTRAMUSCULAR | Status: DC | PRN

## 2015-03-15 MED ORDER — HEPARIN (PORCINE) IN NACL 2-0.9 UNIT/ML-% IJ SOLN
INTRAMUSCULAR | Status: AC
  Filled 2015-03-15: qty 1000

## 2015-03-15 MED ORDER — SODIUM CHLORIDE 0.9 % IV SOLN
250.0000 mg | INTRAVENOUS | Status: DC | PRN
  Administered 2015-03-15: 1.75 mg/kg/h via INTRAVENOUS

## 2015-03-15 MED ORDER — FENTANYL CITRATE (PF) 100 MCG/2ML IJ SOLN
INTRAMUSCULAR | Status: AC
  Filled 2015-03-15: qty 2

## 2015-03-15 MED ORDER — CLOPIDOGREL BISULFATE 300 MG PO TABS
ORAL_TABLET | ORAL | Status: DC | PRN
  Administered 2015-03-15: 600 mg via ORAL

## 2015-03-15 MED ORDER — IOHEXOL 350 MG/ML SOLN
INTRAVENOUS | Status: DC | PRN
  Administered 2015-03-15: 110 mL via INTRA_ARTERIAL

## 2015-03-15 MED ORDER — BIVALIRUDIN 250 MG IV SOLR
INTRAVENOUS | Status: AC
  Filled 2015-03-15: qty 250

## 2015-03-15 MED ORDER — SODIUM CHLORIDE 0.9 % IJ SOLN: 3.0000 mL | Freq: Two times a day (BID) | INTRAMUSCULAR | Status: DC

## 2015-03-15 MED ORDER — ANGIOPLASTY BOOK
Freq: Once | Status: AC
  Administered 2015-03-15: 21:00:00
  Filled 2015-03-15: qty 1

## 2015-03-15 MED ORDER — SODIUM CHLORIDE 0.9 % IV SOLN
INTRAVENOUS | Status: AC
  Administered 2015-03-15: 12:00:00 via INTRAVENOUS

## 2015-03-15 SURGICAL SUPPLY — 18 items
BALLN EMERGE MR 2.5X12 (BALLOONS) ×2
BALLOON EMERGE MR 2.5X12 (BALLOONS) IMPLANT
CATH INFINITI 5FR MULTPACK ANG (CATHETERS) ×1 IMPLANT
DEVICE SPIDERFX EMB PROT 4MM (WIRE) ×1 IMPLANT
GLIDESHEATH SLEND SS 6F .021 (SHEATH) ×1 IMPLANT
GUIDE CATH RUNWAY 6FR AL 1 (CATHETERS) ×1 IMPLANT
KIT ENCORE 26 ADVANTAGE (KITS) ×1 IMPLANT
KIT HEART LEFT (KITS) ×2 IMPLANT
PACK CARDIAC CATHETERIZATION (CUSTOM PROCEDURE TRAY) ×2 IMPLANT
SHEATH PINNACLE 5F 10CM (SHEATH) ×1 IMPLANT
SHEATH PINNACLE 6F 10CM (SHEATH) ×1 IMPLANT
STENT PROMUS PREM MR 3.5X16 (Permanent Stent) ×1 IMPLANT
SYR MEDRAD MARK V 150ML (SYRINGE) ×2 IMPLANT
TRANSDUCER W/STOPCOCK (MISCELLANEOUS) ×2 IMPLANT
TUBING CIL FLEX 10 FLL-RA (TUBING) ×2 IMPLANT
WIRE COUGAR XT STRL 190CM (WIRE) ×1 IMPLANT
WIRE EMERALD 3MM-J .035X150CM (WIRE) IMPLANT
WIRE SAFE-T 1.5MM-J .035X260CM (WIRE) ×2 IMPLANT

## 2015-03-15 NOTE — Interval H&P Note (Signed)
History and Physical Interval Note:  03/15/2015 9:36 AM  Franklin PartyLarry Marks  has presented today for cardiac cath with the diagnosis of unstable angina, CAD. The various methods of treatment have been discussed with the patient and family. After consideration of risks, benefits and other options for treatment, the patient has consented to  Procedure(s): Left Heart Cath and Cors/Grafts Angiography (N/A) as a surgical intervention .  The patient's history has been reviewed, patient examined, no change in status, stable for surgery.  I have reviewed the patient's chart and labs.  Questions were answered to the patient's satisfaction.    Cath Lab Visit (complete for each Cath Lab visit)  Clinical Evaluation Leading to the Procedure:   ACS: Yes.    Non-ACS:    Anginal Classification: CCS III  Anti-ischemic medical therapy: Minimal Therapy (1 class of medications)  Non-Invasive Test Results: High-risk stress test findings: cardiac mortality >3%/year  Prior CABG: Previous CABG         MCALHANY,CHRISTOPHER

## 2015-03-15 NOTE — Progress Notes (Signed)
Pt c/o of sharp chest pain 3/10 (middle chest)@21 :55. V/S stable, EKG done without changes. Ntg SL x 1 dose given with relief. Dr Shirlee LatchMclean informed. Will continue to monitor pt.

## 2015-03-15 NOTE — Progress Notes (Signed)
Site area: right groin  Site Prior to Removal:  Level 0  Pressure Applied For 20 MINUTES    Minutes Beginning at 1325  Manual:   Yes.    Patient Status During Pull:  See note below  Post Pull Groin Site:  Level 0  Post Pull Instructions Given:  Yes.    Post Pull Pulses Present:  Yes.    Dressing Applied:  Yes.    Comments:  1320 Patient complained of 3/10 chest pressure and tightness mid sternal area, where pain was prior to cath but much less intense. VSS. 1325 chest pressure decreasing and blood pressure decreasing as well. Discussed possible relation of increased blood pressure with chest tightness. Sheath pulled at 1325 and pressure held. 1330 patient stated pressure and tightness returned and complained of nausea at 1335. 1338 patient given zofran 4 mg IV and nausea resolved but continued to complain of chest pressure and tightness 3-4/10. 12 lead ecg done at 1349 and no changes noted from previous ecg. 1350 blood pressure 161/88, given 1 sl nitroglycerin pill with relief of tightness and pressure. Blood pressure 105/68 at 1355. Texted PA with above information. Blood pressure better controlled after nitroglycerin given with no further complaints of chest pain. Resting quietly and sleeping during bedrest period after sheath pull.

## 2015-03-15 NOTE — Progress Notes (Signed)
[facility] PROGRESS NOTE  Catherine Oak NWG:[id number] DOB: 07/17/1950 DOA: 03/14/2015 PCP: Tillman Abide, MD  Brief narrative 64 year old male with history of coronary artery disease status post CABG in 2001, ischemic cardiomyopathy, history of NSTEMI associated with ventricular fibrillation in 2013,  hypertension, hyperlipidemia, chronic asymptomatic bradycardia with first-degree AV block , COPD with ongoing tobacco use, anxiety, A. fib not on anticoagulation ,  presented with acute onset of chest pain early this morning, substernal, 5/10 in severity radiating to the left arm. Symptoms were intermittent occurring every few minutes and associated with diaphoresis. Denied any shortness of breath, nausea, vomiting, abdominal pain, palpitations, bowel or urinary symptoms. Chest pain resolved after taking full dose aspirin and 3 sublingual nitroglycerin. In the ED EKG was unremarkable with negative troponin. His heart rate was in the 40s. Admitted to rule out for ACS. Patient seen by cardiology and underwent Lexiscan Myoview which was abnormal.   Assessment/Plan: NSTEMI/ unstable angina Troponin peaked at 0.06.  No further chest pain symptoms.  Lexiscan Myoview showing median defect in the basal inferior and mid inferior location and of medium defect in the basal anterior lateral and mid anterolateral location. Both of moderate severity. Left ventricular EF of 45-54%. -Cardiac cath done shows diffuse chronic multivessel disease with significant stenosis of SVG to diagonal 2 and a drug-eluting stent placed. LVEF was 35 and 40%. Tolerated procedure well. -Cardiology recommend aspirin and Plavix for 1 year. Continue beta blocker, statin and ACE inhibitor.  Tobacco abuse Counseled strongly on cessation. Continue nicotine patch. Patient wishes to quit. Reports he has  been cutting down.  History of cardiac arrest   Sinus bradycardia Asymptomatic. Patient has chronic sinus bradycardia with  first-degree AV block on EKG. Heart rate in the range of 40-50. Continue to monitor. on low-dose beta blocker.  Peripheral arterial disease Continue aspirin and statin. Has chronically occluded SFA bilaterally. Reports occasional claudication.. Counseled again on smoking cessation.  Diet: Heart healthy,   Code Status: Full code Family Communication: Wife at bedside Disposition Plan: Home in a.m.   Consultants:  Cardiology  Procedures:  Lexiscan Myoview  Cardiac cath on 12/28  Antibiotics:  None  HPI/Subjective: Patient seen and examined after returning from cardiac cath. Reports some discomfort to urinate.  Objective: Filed Vitals:   03/15/15 1415 03/15/15 1430  BP: 158/59 132/69  Pulse: 57 57  Temp:    Resp: 16 18    Intake/Output Summary (Last 24 hours) at 03/15/15 1447 Last data filed at 03/15/15 1315  Gross per 24 hour  Intake    240 ml  Output    375 ml  Net   -135 ml   Filed Weights   03/14/15 0452 03/14/15 0648 03/15/15 0449  Weight: 76.204 kg (168 lb) 78.1 kg (172 lb 2.9 oz) 76.975 kg (169 lb 11.2 oz)    Exam:   General:not in distress  HEENT:  moist mucosa, poor dentition  Chest: Clear bilaterally  Cardiovascular: S1 and S2 bradycardic, no murmurs rubs or gallop  GI: Soft, nondistended, nontender  Musculoskeletal: Warm, no edema, dressing over right groin clean  Data Reviewed: Basic Metabolic Panel:  Recent Labs Lab 03/14/15 0500 03/14/15 0508 03/15/15 0419  NA 140 140 140  K 4.3 4.1 3.9  CL 106 103 107  CO2 25  --  24  GLUCOSE 124* 114* 125*  BUN CREATININE 0.95 1.00 0.94  CALCIUM 9.2  --  9.0   Liver Function Tests:  Recent Labs Lab 03/15/15 0419  AST 16  ALT 18  ALKPHOS 66  BILITOT 0.7  PROT 6.5  ALBUMIN 3.5   No results for input(s): LIPASE, AMYLASE in the last 168 hours. No results for input(s): AMMONIA in the last 168 hours. CBC:  Recent Labs Lab 03/14/15 0500 03/14/15 0508 03/15/15 0419   WBC 10.7*  --  7.0  HGB 13.9 14.3 13.2  HCT 41.4 42.0 39.2  MCV 92.2  --  92.5  PLT 170  --  146*   Cardiac Enzymes:  Recent Labs Lab 03/14/15 0624 03/14/15 1135 03/14/15 1801  TROPONINI 0.03 0.06* 0.06*   BNP (last 3 results) No results for input(s): BNP in the last 8760 hours.  ProBNP (last 3 results) No results for input(s): PROBNP in the last 8760 hours.  CBG:  Recent Labs Lab 03/14/15 0700 03/15/15 0700 03/15/15 0901 03/15/15 1407  GLUCAP 132* 117* 137* 106*    No results found for this or any previous visit (from the past 240 hour(s)).   Studies: Dg Chest 2 View  03/14/2015  CLINICAL DATA:  Acute onset of mid to right-sided chest pain. Initial encounter. EXAM: CHEST  2 VIEW COMPARISON:  Chest radiograph performed 01/12/2012 FINDINGS: The lungs are well-aerated. Vascular congestion is noted. Mildly increased interstitial markings are nonspecific. There is no evidence of pleural effusion or pneumothorax. The heart is normal in size; the patient is status post median sternotomy, with evidence of prior CABG. No acute osseous abnormalities are seen. IMPRESSION: Vascular congestion noted. Mildly increased interstitial markings are nonspecific. Lungs otherwise grossly clear. Electronically Signed   By: Roanna RaiderJeffery  Chang M.D.   On: 03/14/2015 05:14   Nm Myocar Multi W/spect W/wall Motion / Ef  03/14/2015   There was no ST segment deviation noted during stress.  T wave inversion was noted during stress in the I and V4 leads, beginning at 1 minutes of stress, ending at 3 minutes of stress.  Defect 1: There is a medium defect of moderate severity present in the basal inferior and mid inferior location.  Defect 2: There is a medium defect of moderate severity present in the basal anterolateral and mid anterolateral location.  This is a high risk study.  The left ventricular ejection fraction is mildly decreased (45-54%).  Findings consistent with ischemia.  High risk stress  nuclear study with ischemia suggesting multivessel CAD and mildly reduced global systolic function.    Scheduled Meds: . aspirin  81 mg Oral Daily  . atorvastatin  80 mg Oral q1800  . carvedilol  3.125 mg Oral BID WC  . clonazePAM  0.5 mg Oral QHS  . [START ON 03/16/2015] clopidogrel  75 mg Oral Q breakfast  . Influenza vac split quadrivalent PF  0.5 mL Intramuscular Tomorrow-1000  . lisinopril  2.5 mg Oral Daily  . nicotine  14 mg Transdermal Daily  . pantoprazole  40 mg Oral Daily  . sodium chloride  3 mL Intravenous Q12H  . sodium chloride  3 mL Intravenous Q12H   Continuous Infusions: . sodium chloride 75 mL/hr at 03/15/15 1130       Time spent: 25 minutes    Eddie NorthDHUNGEL, Malayjah Otoole  [facility] Pager [phone] If 7PM-7AM, please contact night-coverage at [email or web address], password [email or web address] 03/15/2015, 2:47 PM  LOS: 1 day

## 2015-03-15 NOTE — Progress Notes (Signed)
SUBJECTIVE:  No further chest pain.  No SOB.     PHYSICAL EXAM Filed Vitals:   03/14/15 1255 03/14/15 1257 03/14/15 2145 03/15/15 0449  BP: 155/92 150/89 124/69 113/64  Pulse:   63 47  Temp:   97.8 F (36.6 C) 97.6 F (36.4 C)  TempSrc:   Oral Oral  Resp:   18 18  Height:      Weight:    169 lb 11.2 oz (76.975 kg)  SpO2:   97% 98%   General:  No distress Lungs:  Clear Heart:  RRR Abdomen:  Positive bowel sounds, no rebound no guarding Extremities:  No edema   LABS: Lab Results  Component Value Date   TROPONINI 0.06* 03/14/2015   Results for orders placed or performed during the hospital encounter of 03/14/15 (from the past 24 hour(s))  Troponin I (q 6hr x 3)     Status: Abnormal   Collection Time: 03/14/15 11:35 AM  Result Value Ref Range   Troponin I 0.06 (H) <0.031 ng/mL  Urine rapid drug screen (hosp performed)     Status: Abnormal   Collection Time: 03/14/15  3:38 PM  Result Value Ref Range   Opiates POSITIVE (A) NONE DETECTED   Cocaine NONE DETECTED NONE DETECTED   Benzodiazepines NONE DETECTED NONE DETECTED   Amphetamines NONE DETECTED NONE DETECTED   Tetrahydrocannabinol POSITIVE (A) NONE DETECTED   Barbiturates NONE DETECTED NONE DETECTED  Troponin I (q 6hr x 3)     Status: Abnormal   Collection Time: 03/14/15  6:01 PM  Result Value Ref Range   Troponin I 0.06 (H) <0.031 ng/mL  Comprehensive metabolic panel     Status: Abnormal   Collection Time: 03/15/15  4:19 AM  Result Value Ref Range   Sodium 140 135 - 145 mmol/L   Potassium 3.9 3.5 - 5.1 mmol/L   Chloride 107 101 - 111 mmol/L   CO2 24 22 - 32 mmol/L   Glucose, Bld 125 (H) 65 - 99 mg/dL   BUN 10 6 - 20 mg/dL   Creatinine, Ser 1.610.94 0.61 - 1.24 mg/dL   Calcium 9.0 8.9 - 09.610.3 mg/dL   Total Protein 6.5 6.5 - 8.1 g/dL   Albumin 3.5 3.5 - 5.0 g/dL   AST 16 15 - 41 U/L   ALT 18 17 - 63 U/L   Alkaline Phosphatase 66 38 - 126 U/L   Total Bilirubin 0.7 0.3 - 1.2 mg/dL   GFR calc non Af Amer >60  >60 mL/min   GFR calc Af Amer >60 >60 mL/min   Anion gap 9 5 - 15  CBC     Status: Abnormal   Collection Time: 03/15/15  4:19 AM  Result Value Ref Range   WBC 7.0 4.0 - 10.5 K/uL   RBC 4.24 4.22 - 5.81 MIL/uL   Hemoglobin 13.2 13.0 - 17.0 g/dL   HCT 04.539.2 40.939.0 - 81.152.0 %   MCV 92.5 78.0 - 100.0 fL   MCH 31.1 26.0 - 34.0 pg   MCHC 33.7 30.0 - 36.0 g/dL   RDW 91.412.9 78.211.5 - 95.615.5 %   Platelets 146 (L) 150 - 400 K/uL  Glucose, capillary     Status: Abnormal   Collection Time: 03/15/15  7:00 AM  Result Value Ref Range   Glucose-Capillary 117 (H) 65 - 99 mg/dL    Intake/Output Summary (Last 24 hours) at 03/15/15 0746 Last data filed at 03/14/15 1725  Gross per 24 hour  Intake  240 ml  Output    400 ml  Net   -160 ml    ECHO:    - Left ventricle: The cavity size was normal. Wall thickness was normal. Systolic function was normal. The estimated ejection fraction was in the range of 55% to 60%. Wall motion was normal; there were no regional wall motion abnormalities. Doppler parameters are consistent with abnormal left ventricular relaxation (grade 1 diastolic dysfunction). - Left atrium: The atrium was mildly dilated. - Right atrium: The atrium was mildly dilated. - Pulmonary arteries: Systolic pressure was mildly increased. PA peak pressure: 35 mm Hg (S).   ASSESSMENT AND PLAN:  CAD:  Abnormal stress test and mildly elevated troponin.  For cath today.  The patient understands that risks included but are not limited to stroke (1 in 1000), death (1 in 1000), kidney failure [usually temporary] (1 in 500), bleeding (1 in 200), allergic reaction [possibly serious] (1 in 200).  The patient understands and agrees to proceed.     HTN:    OK.  Continue current therapy.    Fayrene Fearing [facility] 03/15/2015 7:46 AM

## 2015-03-15 NOTE — H&P (View-Only) (Signed)
SUBJECTIVE:  No further chest pain.  No SOB.     PHYSICAL EXAM Filed Vitals:   03/14/15 1255 03/14/15 1257 03/14/15 2145 03/15/15 0449  BP: 155/92 150/89 124/69 113/64  Pulse:   63 47  Temp:   97.8 F (36.6 C) 97.6 F (36.4 C)  TempSrc:   Oral Oral  Resp:   18 18  Height:      Weight:    169 lb 11.2 oz (76.975 kg)  SpO2:   97% 98%   General:  No distress Lungs:  Clear Heart:  RRR Abdomen:  Positive bowel sounds, no rebound no guarding Extremities:  No edema   LABS: Lab Results  Component Value Date   TROPONINI 0.06* 03/14/2015   Results for orders placed or performed during the hospital encounter of 03/14/15 (from the past 24 hour(s))  Troponin I (q 6hr x 3)     Status: Abnormal   Collection Time: 03/14/15 11:35 AM  Result Value Ref Range   Troponin I 0.06 (H) <0.031 ng/mL  Urine rapid drug screen (hosp performed)     Status: Abnormal   Collection Time: 03/14/15  3:38 PM  Result Value Ref Range   Opiates POSITIVE (A) NONE DETECTED   Cocaine NONE DETECTED NONE DETECTED   Benzodiazepines NONE DETECTED NONE DETECTED   Amphetamines NONE DETECTED NONE DETECTED   Tetrahydrocannabinol POSITIVE (A) NONE DETECTED   Barbiturates NONE DETECTED NONE DETECTED  Troponin I (q 6hr x 3)     Status: Abnormal   Collection Time: 03/14/15  6:01 PM  Result Value Ref Range   Troponin I 0.06 (H) <0.031 ng/mL  Comprehensive metabolic panel     Status: Abnormal   Collection Time: 03/15/15  4:19 AM  Result Value Ref Range   Sodium 140 135 - 145 mmol/L   Potassium 3.9 3.5 - 5.1 mmol/L   Chloride 107 101 - 111 mmol/L   CO2 24 22 - 32 mmol/L   Glucose, Bld 125 (H) 65 - 99 mg/dL   BUN 10 6 - 20 mg/dL   Creatinine, Ser 1.610.94 0.61 - 1.24 mg/dL   Calcium 9.0 8.9 - 09.610.3 mg/dL   Total Protein 6.5 6.5 - 8.1 g/dL   Albumin 3.5 3.5 - 5.0 g/dL   AST 16 15 - 41 U/L   ALT 18 17 - 63 U/L   Alkaline Phosphatase 66 38 - 126 U/L   Total Bilirubin 0.7 0.3 - 1.2 mg/dL   GFR calc non Af Amer >60  >60 mL/min   GFR calc Af Amer >60 >60 mL/min   Anion gap 9 5 - 15  CBC     Status: Abnormal   Collection Time: 03/15/15  4:19 AM  Result Value Ref Range   WBC 7.0 4.0 - 10.5 K/uL   RBC 4.24 4.22 - 5.81 MIL/uL   Hemoglobin 13.2 13.0 - 17.0 g/dL   HCT 04.539.2 40.939.0 - 81.152.0 %   MCV 92.5 78.0 - 100.0 fL   MCH 31.1 26.0 - 34.0 pg   MCHC 33.7 30.0 - 36.0 g/dL   RDW 91.412.9 78.211.5 - 95.615.5 %   Platelets 146 (L) 150 - 400 K/uL  Glucose, capillary     Status: Abnormal   Collection Time: 03/15/15  7:00 AM  Result Value Ref Range   Glucose-Capillary 117 (H) 65 - 99 mg/dL    Intake/Output Summary (Last 24 hours) at 03/15/15 0746 Last data filed at 03/14/15 1725  Gross per 24 hour  Intake  240 ml  Output    400 ml  Net   -160 ml    ECHO:    - Left ventricle: The cavity size was normal. Wall thickness was normal. Systolic function was normal. The estimated ejection fraction was in the range of 55% to 60%. Wall motion was normal; there were no regional wall motion abnormalities. Doppler parameters are consistent with abnormal left ventricular relaxation (grade 1 diastolic dysfunction). - Left atrium: The atrium was mildly dilated. - Right atrium: The atrium was mildly dilated. - Pulmonary arteries: Systolic pressure was mildly increased. PA peak pressure: 35 mm Hg (S).   ASSESSMENT AND PLAN:  CAD:  Abnormal stress test and mildly elevated troponin.  For cath today.  The patient understands that risks included but are not limited to stroke (1 in 1000), death (1 in 1000), kidney failure [usually temporary] (1 in 500), bleeding (1 in 200), allergic reaction [possibly serious] (1 in 200).  The patient understands and agrees to proceed.     HTN:    OK.  Continue current therapy.    Franklin Marks 03/15/2015 7:46 AM   

## 2015-03-16 ENCOUNTER — Telehealth: Payer: Self-pay | Admitting: Cardiovascular Disease

## 2015-03-16 ENCOUNTER — Telehealth: Payer: Self-pay | Admitting: Internal Medicine

## 2015-03-16 DIAGNOSIS — I214 Non-ST elevation (NSTEMI) myocardial infarction: Secondary | ICD-10-CM | POA: Diagnosis not present

## 2015-03-16 DIAGNOSIS — F1210 Cannabis abuse, uncomplicated: Secondary | ICD-10-CM | POA: Diagnosis present

## 2015-03-16 DIAGNOSIS — Z955 Presence of coronary angioplasty implant and graft: Secondary | ICD-10-CM | POA: Diagnosis present

## 2015-03-16 DIAGNOSIS — R7303 Prediabetes: Secondary | ICD-10-CM | POA: Diagnosis present

## 2015-03-16 DIAGNOSIS — R001 Bradycardia, unspecified: Secondary | ICD-10-CM

## 2015-03-16 DIAGNOSIS — I200 Unstable angina: Secondary | ICD-10-CM

## 2015-03-16 LAB — BASIC METABOLIC PANEL
Anion gap: 6 (ref 5–15)
BUN: 10 mg/dL (ref 6–20)
CHLORIDE: 110 mmol/L (ref 101–111)
CO2: 24 mmol/L (ref 22–32)
CREATININE: 0.94 mg/dL (ref 0.61–1.24)
Calcium: 9 mg/dL (ref 8.9–10.3)
GFR calc Af Amer: >60 mL/min (ref >60)
GFR calc non Af Amer: >60 mL/min (ref >60)
Glucose, Bld: 107 mg/dL — ABNORMAL HIGH (ref 65–99)
Potassium: 3.9 mmol/L (ref 3.5–5.1)
SODIUM: 140 mmol/L (ref 135–145)

## 2015-03-16 LAB — CBC
HCT: 41 % (ref 39.0–52.0)
Hemoglobin: 13.8 g/dL (ref 13.0–17.0)
MCH: 31.1 pg (ref 26.0–34.0)
MCHC: 33.7 g/dL (ref 30.0–36.0)
MCV: 92.3 fL (ref 78.0–100.0)
PLATELETS: 146 10*3/uL — AB (ref 150–400)
RBC: 4.44 MIL/uL (ref 4.22–5.81)
RDW: 13 % (ref 11.5–15.5)
WBC: 6 10*3/uL (ref 4.0–10.5)

## 2015-03-16 MED ORDER — NITROGLYCERIN 0.4 MG SL SUBL: 0.4000 mg | SUBLINGUAL_TABLET | SUBLINGUAL | Status: DC | PRN

## 2015-03-16 MED ORDER — ATORVASTATIN CALCIUM 80 MG PO TABS: 80.0000 mg | ORAL_TABLET | Freq: Every day | ORAL | Status: DC

## 2015-03-16 MED ORDER — NICOTINE 14 MG/24HR TD PT24: 14.0000 mg | MEDICATED_PATCH | Freq: Every day | TRANSDERMAL | Status: DC

## 2015-03-16 MED ORDER — CLOPIDOGREL BISULFATE 75 MG PO TABS: 75.0000 mg | ORAL_TABLET | Freq: Every day | ORAL | Status: DC

## 2015-03-16 NOTE — Telephone Encounter (Signed)
Spoke with patient and advised results   

## 2015-03-16 NOTE — Progress Notes (Signed)
SUBJECTIVE:  PCI yesterday and chest pain afterward.  Currently no chest pain.  No SOB.    PHYSICAL EXAM Filed Vitals:   03/15/15 1800 03/15/15 2000 03/15/15 2034 03/16/15 0420  BP: 122/56  108/90 113/65  Pulse:   60 57  Temp:   97.7 F (36.5 C) 97.6 F (36.4 C)  TempSrc:   Oral Oral  Resp: 21 18 29 22   Height:      Weight:    171 lb 8.3 oz (77.8 kg)  SpO2: 98%  98% 98%   General:  No distress Lungs:  Clear Heart:  RRR Abdomen:  Positive bowel sounds, no rebound no guarding Extremities:  No edema, right femoral without bruits or bruising.    LABS:  Results for orders placed or performed during the hospital encounter of 03/14/15 (from the past 24 hour(s))  Glucose, capillary     Status: Abnormal   Collection Time: 03/15/15  9:01 AM  Result Value Ref Range   Glucose-Capillary 137 (H) 65 - 99 mg/dL  POCT Activated clotting time     Status: None   Collection Time: 03/15/15 10:19 AM  Result Value Ref Range   Activated Clotting Time 353 seconds  Glucose, capillary     Status: Abnormal   Collection Time: 03/15/15  2:07 PM  Result Value Ref Range   Glucose-Capillary 106 (H) 65 - 99 mg/dL  Glucose, capillary     Status: Abnormal   Collection Time: 03/15/15  6:15 PM  Result Value Ref Range   Glucose-Capillary 108 (H) 65 - 99 mg/dL  Basic metabolic panel     Status: Abnormal   Collection Time: 03/16/15  3:35 AM  Result Value Ref Range   Sodium 140 135 - 145 mmol/L   Potassium 3.9 3.5 - 5.1 mmol/L   Chloride 110 101 - 111 mmol/L   CO2 24 22 - 32 mmol/L   Glucose, Bld 107 (H) 65 - 99 mg/dL   BUN 10 6 - 20 mg/dL   Creatinine, Ser 7.250.94 0.61 - 1.24 mg/dL   Calcium 9.0 8.9 - 36.610.3 mg/dL   GFR calc non Af Amer >60 >60 mL/min   GFR calc Af Amer >60 >60 mL/min   Anion gap 6 5 - 15  CBC     Status: Abnormal   Collection Time: 03/16/15  3:35 AM  Result Value Ref Range   WBC 6.0 4.0 - 10.5 K/uL   RBC 4.44 4.22 - 5.81 MIL/uL   Hemoglobin 13.8 13.0 - 17.0 g/dL   HCT 44.041.0  34.739.0 - 42.552.0 %   MCV 92.3 78.0 - 100.0 fL   MCH 31.1 26.0 - 34.0 pg   MCHC 33.7 30.0 - 36.0 g/dL   RDW 95.613.0 38.711.5 - 56.415.5 %   Platelets 146 (L) 150 - 400 K/uL    Intake/Output Summary (Last 24 hours) at 03/16/15 0755 Last data filed at 03/15/15 1900  Gross per 24 hour  Intake    540 ml  Output    775 ml  Net   -235 ml   CATH   Ost RCA lesion, 100% stenosed.  SVG to RCA was injected and is normal in caliber. There is mild disease in the graft.  Right Post Atrio lesion, 100% stenosed. This branch fills from left to right collaterals.  Mid Cx lesion, 30% stenosed.  Ost LAD lesion, 100% stenosed.  SVG to Diagonal 2 was injected and is normal in caliber. There is an ulcerated 95% stenosis in the proximal  body of the graft.  SVG to Diagonal 1 is known to be occluded.  LIMA to LAD was injected and is normal in caliber, and is anatomically normal. The LAD beyond the insertion of the IMA graft has serial 70% lesions, unchanged from last cath.  EKG:  RBBB, LAD, no acute ST T wave changes.  03/16/2015  ASSESSMENT AND PLAN:  CAD:  Status post PCI/DES of SVG to D2.  Residual disease as described.   Continue current therapy.    HTN:    OK.  Continue current therapy.    BRADYCARDIA:  Asymptomatic.  Continue current therapy.   Rollene Rotunda 03/16/2015 7:55 AM

## 2015-03-16 NOTE — Telephone Encounter (Signed)
Pt's spouse called and they are missing the prescription for nitroGLYCERIN (NITROSTAT) SL tablet 0.4 mgfrom [facility].  Spoke with PCP, [facility].  Best number to call Mrs. Aura DialsQuail if needed is [phone].

## 2015-03-16 NOTE — Telephone Encounter (Signed)
New message     TCM appt on  1.5.2017 with Cline CrockKathryn Thompson   Per Vin PA.

## 2015-03-16 NOTE — Discharge Summary (Signed)
Physician Discharge Summary  Franklin Marks ZOX:[id number] DOB: 03-29-1950 DOA: 03/14/2015  PCP: Tillman Abide, MD  Admit date: 03/14/2015 Discharge date: 03/16/2015  Time spent: 30 minutes  Recommendations for Outpatient Follow-up:  -Discharge home with outpatient follow-up with PCP cardiology.  -Patient reports planning to have dental procedure? Placement of dentures as outpatient next month. I have informed him to discuss with his dentist and with cardiology during follow-up if the dental procedure can be done while he is on Plavix.  Discharge Diagnoses:  Principal Problem:   NSTEMI (non-ST elevated myocardial infarction) (HCC)   Active Problems:   CAD (coronary artery disease)   COPD (chronic obstructive pulmonary disease) (HCC)   Atrial fibrillation (HCC)   Sinus bradycardia   PAD (peripheral artery disease) (HCC)   Hyperlipidemia   Tobacco abuse   Ischemic chest pain (HCC)   Marijuana abuse   Prediabetes   Stented coronary artery   Discharge Condition: Fair  Diet recommendation: Heart healthy/diabetic  Filed Weights   03/14/15 0648 03/15/15 0449 03/16/15 0420  Weight: 78.1 kg (172 lb 2.9 oz) 76.975 kg (169 lb 11.2 oz) 77.8 kg (171 lb 8.3 oz)    History of present illness:  Please refer to admission H&P for details, in brief, 64 year old male with history of coronary artery disease status post CABG in 2001, ischemic cardiomyopathy, history of NSTEMI associated with ventricular fibrillation in 2013, hypertension, hyperlipidemia, chronic asymptomatic bradycardia with first-degree AV block , COPD with ongoing tobacco use, anxiety, A. fib not on anticoagulation , presented with acute onset of chest pain early this morning, substernal, 5/10 in severity radiating to the left arm. Symptoms were intermittent occurring every few minutes and associated with diaphoresis. Denied any shortness of breath, nausea, vomiting, abdominal pain, palpitations, bowel or urinary symptoms.  Chest pain resolved after taking full dose aspirin and 3 sublingual nitroglycerin. In the ED EKG was unremarkable with negative troponin. His heart rate was in the 40s. Admitted to rule out for ACS. Patient seen by cardiology and underwent Lexiscan Myoview which was abnormal. He then underwent left heart cath placement of drug-eluting stent of SVG to D2.  Hospital Course:  NSTEMI/ unstable angina Troponin peaked at 0.06. No further chest pain symptoms.  Lexiscan Myoview showing median defect in the basal inferior and mid inferior location and of medium defect in the basal anterior lateral and mid anterolateral location. Both of moderate severity. Left ventricular EF of 45-54%. -Cardiac cath done shows diffuse chronic multivessel disease with significant stenosis of SVG to diagonal 2 and a drug-eluting stent placed. LVEF was 35 - 40%. Tolerated procedure well. -Cardiology recommend aspirin and Plavix for 1 year. Continue beta blocker and ACE inhibitor. He has elevated triglyceride levels. Pravastatin switched to atorvastatin. -Counseled on dietary monitoring and exercise along with medication adherence. -Has follow-up appointment with cardiology on 1/5. -Patient stable this morning with no further chest pain symptoms. Stable to be discharged home.    Tobacco abuse Counseled strongly on cessation. Prescribed nicotine patch. Patient wishes to quit. Reports he has been cutting down.  History of cardiac arrest   Sinus bradycardia Asymptomatic. Patient has chronic sinus bradycardia with first-degree AV block on EKG. Heart rate in the range of 40-50. Stable on low-dose beta blocker.  Peripheral arterial disease Continue aspirin and statin. Has chronically occluded SFA bilaterally. Reports occasional claudication.. Counseled again on smoking cessation.  Prediabetes Counseled on diet and exercise.  Code Status: Full code Family Communication: Wife and daughter at bedside Disposition Plan:  Home  Consultants:  Cardiology  Procedures:  Lexiscan Myoview  Cardiac cath on 12/28  Cardiac cath results  Ost RCA lesion, 100% stenosed.  SVG to RCA was injected and is normal in caliber. There is mild disease in the graft.  Right Post Atrio lesion, 100% stenosed. This branch fills from left to right collaterals.  Mid Cx lesion, 30% stenosed.  Ost LAD lesion, 100% stenosed.  SVG to Diagonal 2 was injected and is normal in caliber. There is an ulcerated 95% stenosis in the proximal body of the graft.  SVG to Diagonal 1 is known to be occluded.  LIMA to LAD was injected and is normal in caliber, and is anatomically normal. The LAD beyond the insertion of the IMA graft has serial 70% lesions, unchanged from last cath.  PCI with DES of proximal SVG to D2.   Antibiotics:  None    Discharge Exam: Filed Vitals:   03/16/15 0420 03/16/15 0808  BP: 113/65 116/68  Pulse: 57 53  Temp: 97.6 F (36.4 C) 97.7 F (36.5 C)  Resp: 22 15     General:not in distress  HEENT: moist mucosa, poor dentition  Chest: Clear bilaterally  Cardiovascular: S1 and S2 bradycardic, no murmurs rubs or gallop  GI: Soft, nondistended, nontender  Musculoskeletal: Warm, no edema,  right groin clean  Discharge Instructions   Discharge Instructions    Amb Referral to Cardiac Rehabilitation    Complete by:  As directed   Diagnosis:   Myocardial Infarction PCI            Current Discharge Medication List    START taking these medications   Details  atorvastatin (LIPITOR) 80 MG tablet Take 1 tablet (80 mg total) by mouth daily at 6 PM. Qty: 30 tablet, Refills: 0    clopidogrel (PLAVIX) 75 MG tablet Take 1 tablet (75 mg total) by mouth daily with breakfast. Qty: 30 tablet, Refills: 0    nicotine (NICODERM CQ - DOSED IN MG/24 HOURS) 14 mg/24hr patch Place 1 patch (14 mg total) onto the skin daily. Qty: 28 patch, Refills: 0      CONTINUE these medications which have NOT  CHANGED   Details  aspirin 81 MG chewable tablet Chew 1 tablet (81 mg total) by mouth daily.    carvedilol (COREG) 6.25 MG tablet Take 0.5 tablets (3.125 mg total) by mouth 2 (two) times daily with a meal. Qty: 90 tablet, Refills: 3    clonazePAM (KLONOPIN) 0.5 MG tablet TAKE 1 TABLET BY MOUTH EVERY NIGHT AT BEDTIME AS NEEDED. Qty: 30 tablet, Refills: 0    lisinopril (PRINIVIL,ZESTRIL) 5 MG tablet TAKE 1/2 TABLET BY MOUTH EVERY DAY. Qty: 45 tablet, Refills: 3    nitroGLYCERIN (NITROSTAT) 0.4 MG SL tablet Place 1 tablet (0.4 mg total) under the tongue every 5 (five) minutes as needed for chest pain. Qty: 25 tablet, Refills: 3      STOP taking these medications     pravastatin (PRAVACHOL) 40 MG tablet        Allergies  Allergen Reactions  . Codeine Nausea And Vomiting   Follow-up Information    Follow up with THOMPSON, KATHRYN R, PA-C. Go on 03/23/2015.   Specialties:  Cardiology, Radiology   Why:  @8 :00 am for [facility]   Contact information:   [address] [place] [id number] [phone]       Follow up with Tillman Abideichard Letvak, MD In 2 weeks.   Specialties:  Internal Medicine, Pediatrics   Contact  information:   [address] [place] [address] [phone]        The results of significant diagnostics from this hospitalization (including imaging, microbiology, ancillary and laboratory) are listed below for reference.    Significant Diagnostic Studies: Dg Chest 2 View  03/14/2015  CLINICAL DATA:  Acute onset of mid to right-sided chest pain. Initial encounter. EXAM: CHEST  2 VIEW COMPARISON:  Chest radiograph performed 01/12/2012 FINDINGS: The lungs are well-aerated. Vascular congestion is noted. Mildly increased interstitial markings are nonspecific. There is no evidence of pleural effusion or pneumothorax. The heart is normal in size; the patient is status post median sternotomy, with evidence of prior CABG. No acute osseous abnormalities are  seen. IMPRESSION: Vascular congestion noted. Mildly increased interstitial markings are nonspecific. Lungs otherwise grossly clear. Electronically Signed   By: Roanna Raider M.D.   On: 03/14/2015 05:14   Nm Myocar Multi W/spect W/wall Motion / Ef  03/14/2015   There was no ST segment deviation noted during stress.  T wave inversion was noted during stress in the I and V4 leads, beginning at 1 minutes of stress, ending at 3 minutes of stress.  Defect 1: There is a medium defect of moderate severity present in the basal inferior and mid inferior location.  Defect 2: There is a medium defect of moderate severity present in the basal anterolateral and mid anterolateral location.  This is a high risk study.  The left ventricular ejection fraction is mildly decreased (45-54%).  Findings consistent with ischemia.  High risk stress nuclear study with ischemia suggesting multivessel CAD and mildly reduced global systolic function.    Microbiology: No results found for this or any previous visit (from the past 240 hour(s)).   Labs: Basic Metabolic Panel:  Recent Labs Lab 03/14/15 0500 03/14/15 0508 03/15/15 0419 03/16/15 0335  NA 140 140 140 140  K 4.3 4.1 3.9 3.9  CL 106 103 107 110  CO2 25  --  24 24  GLUCOSE 124* 114* 125* 107*  BUN CREATININE 0.95 1.00 0.94 0.94  CALCIUM 9.2  --  9.0 9.0   Liver Function Tests:  Recent Labs Lab 03/15/15 0419  AST 16  ALT 18  ALKPHOS 66  BILITOT 0.7  PROT 6.5  ALBUMIN 3.5   No results for input(s): LIPASE, AMYLASE in the last 168 hours. No results for input(s): AMMONIA in the last 168 hours. CBC:  Recent Labs Lab 03/14/15 0500 03/14/15 0508 03/15/15 0419 03/16/15 0335  WBC 10.7*  --  7.0 6.0  HGB 13.9 14.3 13.2 13.8  HCT 41.4 42.0 39.2 41.0  MCV 92.2  --  92.5 92.3  PLT 170  --  146* 146*   Cardiac Enzymes:  Recent Labs Lab 03/14/15 0624 03/14/15 1135 03/14/15 1801  TROPONINI 0.03 0.06* 0.06*   BNP: BNP  (last 3 results) No results for input(s): BNP in the last 8760 hours.  ProBNP (last 3 results) No results for input(s): PROBNP in the last 8760 hours.  CBG:  Recent Labs Lab 03/14/15 0700 03/15/15 0700 03/15/15 0901 03/15/15 1407 03/15/15 1815  GLUCAP 132* 117* 137* 106* 108*       Signed:  Eddie North MD  FACP  [facility] 03/16/2015, 11:25 AM

## 2015-03-16 NOTE — Telephone Encounter (Signed)
Let them know I sent the prescription (but I think it may have been sent from hospital also)

## 2015-03-16 NOTE — Discharge Instructions (Addendum)
Acute Coronary Syndrome  Acute coronary syndrome (ACS) is a serious problem in which there is suddenly not enough blood and oxygen supplied to the heart. ACS may mean that one or more of the blood vessels in your heart (coronary arteries) may be blocked. ACS can result in chest pain or a heart attack (myocardial infarction or MI).  CAUSES  This condition is caused by atherosclerosis, which is the buildup of fat and cholesterol (plaque) on the inside of the arteries. Over time, the plaque may narrow or block the artery, and this will lessen blood flow to the heart. Plaque can also become weak and break off within a coronary artery to form a clot and cause a sudden blockage.  RISK FACTORS  The risks factors of this condition include:   High cholesterol levels.   High blood pressure (hypertension).   Smoking.   Diabetes.   Age.   Family history of chest pain, heart disease, or stroke.   Lack of exercise.  SYMPTOMS  The most common signs of this condition include:   Chest pain, which can be:    A crushing or squeezing in the chest.    A tightness, pressure, fullness, or heaviness in the chest.    Present for more than a few minutes, or it can stop and recur.   Pain in the arms, neck, jaw, or back.   Unexplained heartburn or indigestion.   Shortness of breath.   Nausea.   Sudden cold sweats.   Feeling light-headed or dizzy.  Sometimes, this condition has no symptoms.  DIAGNOSIS  ACS may be diagnosed through the following tests:   Electrocardiogram (ECG).   Blood tests.   Coronary angiogram. This is a procedure to look at the coronary arteries to see if there is any blockage.  TREATMENT  Treatment for ACS may include:   Healthy behavioral changes to reduce or control risk factors.   Medicine.   Coronary stenting.A stent helps to keep an artery open.   Coronary angioplasty. This procedure widens a narrowed or blocked artery.   Coronary artery bypass surgery. This will allow your blood to pass the  blockage (bypass) to reach your heart.  HOME CARE INSTRUCTIONS  Eating and Drinking   Follow a heart-healthy diet. A dietitian can you help to educate you about healthy food options and changes.   Use healthy cooking methods such as roasting, grilling, broiling, baking, poaching, steaming, or stir-frying. Talk to a dietitian to learn more about healthy cooking methods.  Medicines   Take medicines only as directed by your health care provider.   Do not take the following medicines unless your health care provider approves:    Nonsteroidal anti-inflammatory drugs (NSAIDs), such as ibuprofen, naproxen, or celecoxib.    Vitamin supplements that contain vitamin A, vitamin E, or both.    Hormone replacement therapy that contains estrogen with or without progestin.   Stop illegal drug use.  Activities   Follow an exercise program that is approved by your health care provider.   Plan rest periods when you are fatigued.  Lifestyle   Do not use any tobacco products, including cigarettes, chewing tobacco, or electronic cigarettes. If you need help quitting, ask your health care provider.   If you drink alcohol, and your health care provider approves, limit your alcohol intake to no more than 1 drink per day. One drink equals 12 ounces of beer, 5 ounces of wine, or 1 ounces of hard liquor.   Learn to manage   stress.   Maintain a healthy weight. Lose weight as approved by your health care provider.  General Instructions   Manage other health conditions, such as hypertension and diabetes, as directed by your health care provider.   Keep all follow-up visits as directed by your health care provider. This is important.   Your health care provider may ask you to monitor your blood pressure. A blood pressure reading consists of a higher number over a lower number, such as 110 over 72, written as 110/72. Ideally, your blood pressure should be:    Below 140/90 if you have no other medical conditions.    Below 130/80 if  you have diabetes or kidney disease.  SEEK IMMEDIATE MEDICAL CARE IF:   You have pain in your chest, neck, arm, jaw, stomach, or back that lasts more than a few minutes, is recurring, or is not relieved by taking medicine under your tongue (sublingual nitroglycerin).   You have profuse sweating without cause.   You have unexplained:    Heartburn or indigestion.    Shortness of breath or difficulty breathing.    Nausea or vomiting.    Fatigue.    Feelings of nervousness or anxiety.    Weakness.    Diarrhea.   You have sudden light-headedness or dizziness.   You faint.  These symptoms may represent a serious problem that is an emergency. Do not wait to see if the symptoms will go away. Get medical help right away. Call your local emergency services (911 in the [place] Do not drive yourself to the clinic or hospital.     This information is not intended to replace advice given to you by your health care provider. Make sure you discuss any questions you have with your health care provider.     Document Released: 03/04/2005 Document Revised: 03/25/2014 Document Reviewed: 07/06/2013  Elsevier Interactive Patient Education 2016 [employer]

## 2015-03-16 NOTE — Progress Notes (Signed)
CARDIAC REHAB PHASE I   PRE:  Rate/Rhythm: 53 SB  BP:  Sitting: 116/68        SaO2: 98 RA  MODE:  Ambulation: 1000 ft   POST:  Rate/Rhythm: 61 SR  BP:  Sitting: 144/69         SaO2: 99 RA  Pt ambulated 1000 ft on RA, independent, steady gait, tolerated well. Pt denies cp, dizziness, DOE, declined rest stop. Completed MI/stent education with pt wife and daughter at bedside. Reviewed risk factors, tobacco cessation (gave pt fake cigarette), anti-platelet therapy, stent card, activity restrictions, ntg, exercise, heart healthy diet, s/s chf, daily weights, sodium restrictions and phase 2 cardiac rehab. Pt verbalized understanding, receptive to education, states both he and his wife plan to quit smoking. Pt agrees to phase 2 cardiac rehab referral, will send to [facility]. Pt to bed after walk (pt daughter asleep in recliner), call bell within reach.  [phone] Franklin Marks, Franklin Kaeser C, RN, BSN 03/16/2015 8:51 AM

## 2015-03-17 NOTE — Telephone Encounter (Signed)
Patient contacted regarding discharge from [facility] on 03/16/15.  Patient understands to follow up with provider Bary CastillaKaty Thompson PA-C on 03/23/2015 at 0800 at [facility] location. Patient understands discharge instructions? yes Patient understands medications and regiment? yes Patient understands to bring all medications to this visit? yes  Pt has no further questions at this time.  Pt states he feels much better and has no cardiac complaints.

## 2015-03-19 NOTE — Progress Notes (Signed)
Cardiology Office Note   Date:  03/23/2015   ID:  Franklin Marks, DOB June 18, 1950, MRN [id number]  PCP:  Tillman Abide, MD  Cardiologist:  Dr. Kirke Corin EP: Dr. Graciela Husbands     Post hospital follow up- CAD     History of Present Illness: Franklin Marks is a 65 y.o. male with a history of sinus bradycardia, CAD s/p CABG x4 (2001), post op A Fib, PAD, chronic RBBB, restless leg syndrome, ongoing tobacco and marijuana abuse, COPD, GERD, HTN, and HLD who presents to clinic for post hospital follow after recent admission for NSTEMI s/p PCI/DES of SVG to D2.  He has history of V Fib cardiac arrest in 12/2011. Catheterization revealed prox LAD occlusion, ostial RCA occlusion, 30% prox LCx stenosis, LIMA-LAD: 80% post-anastamosis lesion, SVG-Dx2: old occlusion w/ thrombus (noted on prior 2011 cath at [facility]), SVG-Dx: patent, SVG-RCA: patent, diffuse irregs, 80-90% PDA/PLA lesions patent. No culprit lesions identified or intervened upon. A 2D echo was ordered revealing LVEF 35-40% but there was subsequent normalization of LV systolic function 02/2012. He also had atrial fibrillation at the time of his MI and cardiac arrest. Treated with DAPT for one month due issues with complaince. He was thought to have atrial fibrillation associated with the acute event that did not necessitate long-term anticoagulation (Per Dr. Graciela Husbands note 07/16/2012). He has known history of peripheral arterial disease with mildly reduced ABI bilaterally and chronic total occlusion of the SFA on both sides with reconstitution distally via collaterals.   He was recently admitted to [facility] from 03/14/15- 03/16/15 for chest pain. His troponin returned slightly abnormal at 0.06 and he was referred for nuclear stress testing which return abnormal. He underwent LHC on 03/15/15 which revealed Ost RCA, 100% stenosed, SVG--> RCA patent w/ mild dz, Right Post Atrio lesion, 100% stenosed w/ L-> R collaterals, Mid Cx lesion, 30% stenosed, Ost LAD lesion, 100%  stenosed, SVG -> D2 w/ ulcerated 95% stenosis in the proximal body of the graft s/p PCI/DES, SVG-> D1 known occluded, LIMA to LAD normal. The LAD beyond the insertion of the IMA graft has serial 70% lesions, unchanged from last cath. EF 35-45%. Interestingly a 2D ECHO from the day before (03/15/15) showed normal LV function.   Today he presents to clinic for follow up. No Cp or SB. He has been feeling tired since leaving the hospital. He started walking yesterday (5-7 minutes) and had no exertional symptoms. No blood in stool or urine. No dizziness or syncope. No LE edema, orthopnea or PND. He has quit smoking.    Past Medical History  Diagnosis Date  . CAD (coronary artery disease)     s/p CABG 2001  . COPD (chronic obstructive pulmonary disease) (HCC)     ongoing tobacco use  . GERD (gastroesophageal reflux disease)   . Anemia   . NSTEMI (non-ST elevated myocardial infarction) (HCC) 01/10/2012    Associated with ventricular fibrillation  . Ischemic cardiomyopathy 01/10/2012    LVEF 35-40%, mild conc hypertrophy, servere HK of anteroseptum, borderline RVH  . Atrial fibrillation (HCC) 01/13/2012    Past Surgical History  Procedure Laterality Date  . Cardiac catheterization  2011    At [facility]  . Cardiac catheterization  12/2011    prox LAD occlusion, ostial RCA occlusion, 30% prox LCx stenosis, LIMA-LAD: 80% post-anastamosis lesion, SVG-Dx2: old occlusion w/ thrombus (noted on prior 2011 cath at [facility]), SVG-Dx: patent, SVG-RCA: patent, diffuse irregs, 80-90% PDA/PLA lesions patent  . Coronary artery bypass graft  2001  .  Left heart catheterization with coronary angiogram N/A 01/10/2012    Procedure: LEFT HEART CATHETERIZATION WITH CORONARY ANGIOGRAM;  Surgeon: Lennette Biharihomas A Kelly, MD;  [facility];  Service: Cardiovascular;  Laterality: N/A;  . Cardiac catheterization N/A 03/15/2015    Procedure: Left Heart Cath and Cors/Grafts Angiography;  Surgeon: Kathleene Hazelhristopher D McAlhany, MD;   [facility];  Service: Cardiovascular;  Laterality: N/A;  . Cardiac catheterization N/A 03/15/2015    Procedure: Coronary Stent Intervention;  Surgeon: Kathleene Hazelhristopher D McAlhany, MD;  [facility];  Service: Cardiovascular;  Laterality: N/A;  svg to diagnoal 2     Current Outpatient Prescriptions  Medication Sig Dispense Refill  . aspirin 81 MG chewable tablet Chew 1 tablet (81 mg total) by mouth daily.    [name]. atorvastatin (LIPITOR) 80 MG tablet Take 1 tablet (80 mg total) by mouth daily at 6 PM. 30 tablet 0  . carvedilol (COREG) 6.25 MG tablet Take 0.5 tablets (3.125 mg total) by mouth 2 (two) times daily with a meal. 90 tablet 3  . clonazePAM (KLONOPIN) 0.5 MG tablet Take 0.5 mg by mouth 3 times/day as needed-between meals & bedtime for anxiety.    . clopidogrel (PLAVIX) 75 MG tablet Take 1 tablet (75 mg total) by mouth daily with breakfast. 30 tablet 0  . lisinopril (PRINIVIL,ZESTRIL) 5 MG tablet TAKE 1/2 TABLET BY MOUTH EVERY DAY. 45 tablet 3  . nicotine (NICODERM CQ - DOSED IN MG/24 HOURS) 14 mg/24hr patch Place 1 patch (14 mg total) onto the skin daily. 28 patch 0  . nitroGLYCERIN (NITROSTAT) 0.4 MG SL tablet Place 1 tablet (0.4 mg total) under the tongue every 5 (five) minutes as needed for chest pain. 25 tablet 3  . omega-3 acid ethyl esters (LOVAZA) 1 g capsule Take 1 g by mouth daily.     No current facility-administered medications for this visit.    Allergies:   Codeine    Social History:  The patient  reports that he has been smoking Cigarettes.  He has a 45 pack-year smoking history. He has never used smokeless tobacco. He reports that he uses illicit drugs (Marijuana). He reports that he does not drink alcohol.   Family History:  The patient's family history includes Alcohol abuse in his father; Heart disease in his brother and sister; Hyperlipidemia in his brother, father, mother, sister, and sister. There is no history of Diabetes or  Hypertension.    ROS:  Please see the history of present illness.   Otherwise, review of systems are positive for none.   All other systems are reviewed and negative.    PHYSICAL EXAM: VS:  BP 104/52 mmHg  Pulse 56  Ht 5\' 11"  (1.803 m)  Wt 175 lb (79.379 kg)  BMI 24.42 kg/m2 , BMI Body mass index is 24.42 kg/(m^2). GEN: Well nourished, well developed, in no acute distress HEENT: normal Neck: no JVD, carotid bruits, or masses Cardiac: RRR; no murmurs, rubs, or gallops,no edema  Respiratory:  clear to auscultation bilaterally, normal work of breathing GI: soft, nontender, nondistended, + BS MS: no deformity or atrophy Skin: warm and dry, no rash Neuro:  Strength and sensation are intact Psych: euthymic mood, full affect   EKG:  EKG is ordered today. The ekg ordered today demonstrates NSR, RBBB   Recent Labs: 03/15/2015: ALT 18 03/16/2015: BUN 10; Creatinine, Ser 0.94; Hemoglobin 13.8; Platelets 146*; Potassium 3.9; Sodium 140    Lipid Panel    Component Value Date/Time  CHOL 137 03/14/2015 0624   TRIG 293* 03/14/2015 0624   HDL 27* 03/14/2015 0624   CHOLHDL 5.1 03/14/2015 0624   VLDL 59* 03/14/2015 0624   LDLCALC 51 03/14/2015 0624   LDLDIRECT 69.0 06/16/2014 1551      Wt Readings from Last 3 Encounters:  03/23/15 175 lb (79.379 kg)  03/16/15 171 lb 8.3 oz (77.8 kg)  06/16/14 167 lb 1.9 oz (75.805 kg)      Other studies Reviewed: Additional studies/ records that were reviewed today include: LHC, 2D ECHO Review of the above records demonstrates: see HPI   2D ECHO: 03/14/2015 LV EF: 55% -  60% Study Conclusions - Left ventricle: The cavity size was normal. Wall thickness was normal. Systolic function was normal. The estimated ejection fraction was in the range of 55% to 60%. Wall motion was normal; there were no regional wall motion abnormalities. Doppler parameters are consistent with abnormal left ventricular relaxation (grade 1 diastolic  dysfunction). - Left atrium: The atrium was mildly dilated. - Right atrium: The atrium was mildly dilated. - Pulmonary arteries: Systolic pressure was mildly increased. PA peak pressure: 35 mm Hg (S). Impressions: - Normal LV systolic function; grade 1 diastolic dysfunction; mild biatrial enlargement; mild TR; mildly elevated pulmonary pressure.  ASSESSMENT AND PLAN:  Chatham Howington is a 65 y.o. male with a history of sinus bradycardia, CAD s/p CABG x4 (2001), PAD, post op A Fib, chronic RBBB, restless leg syndrome, ongoing tobacco and marijuana abuse, COPD, GERD, HTN, and HLD who presents to clinic for post hospital follow after recent admission for NSTEMI s/p PCI/DES of SVG to D2.  CAD s/p recent PCI:  -- Continue ASA/plavix, statin and BB  Dental work: he needs 9 teeth pulled and needs to be on DAPT for a MINIMUM of 6 months, ideally 1 year. We discussed that some dentist will pull teeth on DAPT knowing that they will bleed longer. He will ask his dentist. They may need to pull in stages or wait at least 6 months. His teeth are not actively in pain or infected.   HTN: BP 104/52 today. Continue Coreg 6.25mg  BID and lisinopril 5mg  daily  HLD: continue atorva 80mg   Tobacco abuse: encouraged continued cessation.   PAD: continue plavix  Ischemic CM: LV gram with EF 35-45%. Interestingly a 2D ECHO from the day before (03/15/15) showed normal LV function.  -- He appear euvolemic on exam. Continue and ACE and BB.   Sinus bradycardia: asymptomatic. Stable on BB. Continue to monitor  Current medicines are reviewed at length with the patient today.  The patient does not have concerns regarding medicines.  The following changes have been made:  no change  Labs/ tests ordered today include:   Orders Placed This Encounter  Procedures  . EKG 12-Lead     Disposition:   FU with Dr. Kirke Corin 2-3 months   Signed, Janetta Hora, PA-C  03/23/2015 8:05 AM    [facility] [address] [place], [place], [place]  [address] Phone: [phone]; Fax: [phone]

## 2015-03-23 ENCOUNTER — Ambulatory Visit (INDEPENDENT_AMBULATORY_CARE_PROVIDER_SITE_OTHER): Payer: Medicare PPO | Admitting: Physician Assistant

## 2015-03-23 ENCOUNTER — Encounter: Payer: Self-pay | Admitting: Physician Assistant

## 2015-03-23 VITALS — BP 104/52 | HR 56 | Ht 71.0 in | Wt 175.0 lb

## 2015-03-23 DIAGNOSIS — I4891 Unspecified atrial fibrillation: Secondary | ICD-10-CM | POA: Diagnosis not present

## 2015-03-23 DIAGNOSIS — I2510 Atherosclerotic heart disease of native coronary artery without angina pectoris: Secondary | ICD-10-CM

## 2015-03-23 DIAGNOSIS — I739 Peripheral vascular disease, unspecified: Secondary | ICD-10-CM

## 2015-03-23 NOTE — Patient Instructions (Addendum)
Medication Instructions:  Your physician recommends that you continue on your current medications as directed. Please refer to the Current Medication list given to you today.   Labwork: None ordered   Testing/Procedures: None ordered  Follow-Up: Your physician recommends that you schedule a follow-up appointment in: 2-3 MONTHS WITH DR Kirke CorinARIDA   Any Other Special Instructions Will Be Listed Below (If Applicable).    If you need a refill on your cardiac medications before your next appointment, please call your pharmacy.

## 2015-04-12 ENCOUNTER — Telehealth: Payer: Self-pay | Admitting: *Deleted

## 2015-04-12 ENCOUNTER — Other Ambulatory Visit: Payer: Self-pay | Admitting: *Deleted

## 2015-04-12 MED ORDER — CLOPIDOGREL BISULFATE 75 MG PO TABS: 75.0000 mg | ORAL_TABLET | Freq: Every day | ORAL | Status: DC

## 2015-04-12 MED ORDER — ATORVASTATIN CALCIUM 80 MG PO TABS: 80.0000 mg | ORAL_TABLET | Freq: Every day | ORAL | Status: DC

## 2015-04-12 NOTE — Telephone Encounter (Signed)
*  STAT* If patient is at the pharmacy, call can be transferred to refill team.   1. Which medications need to be refilled? (please list name of each medication and dose if known) atorvastatin (LIPITOR) 80 MG tablet and clopidogrel (PLAVIX) 75 MG tablet   2. Which pharmacy/location (including street and city if local pharmacy) is medication to be sent to? [facility]  3. Do they need a 30 day or 90 day supply? 90 day

## 2015-04-12 NOTE — Telephone Encounter (Signed)
Requested Prescriptions   Signed Prescriptions Disp Refills  . atorvastatin (LIPITOR) 80 MG tablet 90 tablet 3    Sig: Take 1 tablet (80 mg total) by mouth daily at 6 PM.    Authorizing Provider: Lorine Bears A    Ordering User: Shawnie Dapper, MARINA C  . clopidogrel (PLAVIX) 75 MG tablet 90 tablet 3    Sig: Take 1 tablet (75 mg total) by mouth daily with breakfast.    Authorizing Provider: Lorine Bears A    Ordering User: Kendrick Fries

## 2015-04-13 ENCOUNTER — Other Ambulatory Visit: Payer: Self-pay | Admitting: Internal Medicine

## 2015-04-13 NOTE — Telephone Encounter (Signed)
01/31/2015 

## 2015-04-14 NOTE — Telephone Encounter (Signed)
Approved: 30 x 0 

## 2015-04-14 NOTE — Telephone Encounter (Signed)
rx called into pharmacy

## 2015-04-17 ENCOUNTER — Other Ambulatory Visit: Payer: Self-pay

## 2015-04-17 ENCOUNTER — Telehealth: Payer: Self-pay | Admitting: *Deleted

## 2015-04-17 MED ORDER — ATORVASTATIN CALCIUM 80 MG PO TABS: 80.0000 mg | ORAL_TABLET | Freq: Every day | ORAL | Status: DC

## 2015-04-17 MED ORDER — CLOPIDOGREL BISULFATE 75 MG PO TABS: 75.0000 mg | ORAL_TABLET | Freq: Every day | ORAL | Status: DC

## 2015-04-17 NOTE — Telephone Encounter (Signed)
*  STAT* If patient is at the pharmacy, call can be transferred to refill team.   1. Which medications need to be refilled? (please list name of each medication and dose if known) atorvastatin (LIPITOR) 80 MG tablet and clopidogrel (PLAVIX) 75 MG tablet   2. Which pharmacy/location (including street and city if local pharmacy) is medication to be sent to[facility] [place].   3. Do they need a 30 day or 90 day supply? 90

## 2015-04-17 NOTE — Telephone Encounter (Signed)
Refill sent for Lipitor 80 mg & plavix 75 mg

## 2015-05-23 ENCOUNTER — Other Ambulatory Visit: Payer: Self-pay | Admitting: Cardiovascular Disease

## 2015-05-23 ENCOUNTER — Encounter: Payer: Self-pay | Admitting: Cardiovascular Disease

## 2015-05-23 ENCOUNTER — Ambulatory Visit (INDEPENDENT_AMBULATORY_CARE_PROVIDER_SITE_OTHER): Payer: Medicare PPO | Admitting: Cardiovascular Disease

## 2015-05-23 VITALS — BP 122/78 | HR 56 | Ht 70.0 in | Wt 189.4 lb

## 2015-05-23 DIAGNOSIS — I739 Peripheral vascular disease, unspecified: Secondary | ICD-10-CM

## 2015-05-23 NOTE — Patient Instructions (Signed)
Medication Instructions:  Your physician recommends that you continue on your current medications as directed. Please refer to the Current Medication list given to you today.  Labwork: No new orders.   Testing/Procedures: Your physician has requested that you have a lower extremity arterial doppler. This test is an ultrasound of the arteries in the legs. It looks at arterial blood flow in the legs. Allow one hour for Lower Arterial scans. There are no restrictions or special instructions  Follow-Up: Your physician wants you to follow-up in: 6 MONTHS with Dr Arida.  You will receive a reminder letter in the mail two months in advance. If you don't receive a letter, please call our office to schedule the follow-up appointment.   Any Other Special Instructions Will Be Listed Below (If Applicable).     If you need a refill on your cardiac medications before your next appointment, please call your pharmacy.   

## 2015-05-26 NOTE — Progress Notes (Signed)
Cardiology Office Note   Date:  05/26/2015   ID:  Franklin PartyLarry Marks, DOB 1950/04/28, MRN [id number]  PCP:  Tillman Abideichard Letvak, MD  Cardiologist:   Lorine BearsMuhammad Lissy Deuser, MD   Chief Complaint  Patient presents with  . Circulatory Problem    knee and hip pain with walking      History of Present Illness: Franklin PartyLarry Durden is a 65 y.o. male who presents for  a followup visit regarding peripheral arterial disease and coronary artery disease. He has known history of cardiac arrest occurring in the setting of coronary artery disease and chest pain. He is status post CABG in 2001. He has known history of peripheral arterial disease with mildly reduced ABI bilaterally and  chronic total occlusion of the SFA on both sides with reconstitution distally via collaterals.  He also has restless leg syndrome and was started on clonazepam.  He was hospitalized in December 2016 with non-ST elevation myocardial infarction. He underwent PCI and drug-eluting stent placement of SVG to second diagonal. EF was 35-45%. He has been doing reasonably well and denies recurrent chest pain or significant dyspnea. He quit smoking after his cardiac event. He is able to walk one fourth of a mile and has to stop for a few minutes because of leg pain.   Past Medical History  Diagnosis Date  . CAD (coronary artery disease)     s/p CABG 2001  . COPD (chronic obstructive pulmonary disease) (HCC)     ongoing tobacco use  . GERD (gastroesophageal reflux disease)   . Anemia   . NSTEMI (non-ST elevated myocardial infarction) (HCC) 01/10/2012    Associated with ventricular fibrillation  . Ischemic cardiomyopathy 01/10/2012    LVEF 35-40%, mild conc hypertrophy, servere HK of anteroseptum, borderline RVH  . Atrial fibrillation (HCC) 01/13/2012    Past Surgical History  Procedure Laterality Date  . [facility]  2011    [facility]  . Cardiac catheterization  12/2011    prox LAD occlusion, ostial RCA occlusion, 30% prox LCx  stenosis, LIMA-LAD: 80% post-anastamosis lesion, SVG-Dx2: old occlusion w/ thrombus (noted on prior 2011 cath at [facility]), SVG-Dx: patent, SVG-RCA: patent, diffuse irregs, 80-90% PDA/PLA lesions patent  . Coronary artery bypass graft  2001  . Left heart catheterization with coronary angiogram N/A 01/10/2012    Procedure: LEFT HEART CATHETERIZATION WITH CORONARY ANGIOGRAM;  Surgeon: Lennette Biharihomas A Kelly, MD;  [facility];  Service: Cardiovascular;  Laterality: N/A;  . Cardiac catheterization N/A 03/15/2015    Procedure: Left Heart Cath and Cors/Grafts Angiography;  Surgeon: Kathleene Hazelhristopher D McAlhany, MD;  [facility];  Service: Cardiovascular;  Laterality: N/A;  . Cardiac catheterization N/A 03/15/2015    Procedure: Coronary Stent Intervention;  Surgeon: Kathleene Hazelhristopher D McAlhany, MD;  [facility];  Service: Cardiovascular;  Laterality: N/A;  svg to diagnoal 2     Current Outpatient Prescriptions  Medication Sig Dispense Refill  . aspirin 81 MG chewable tablet Chew 1 tablet (81 mg total) by mouth daily.    [name]. atorvastatin (LIPITOR) 80 MG tablet Take 1 tablet (80 mg total) by mouth daily at 6 PM. 90 tablet 3  . carvedilol (COREG) 6.25 MG tablet Take 0.5 tablets (3.125 mg total) by mouth 2 (two) times daily with a meal. 90 tablet 3  . clonazePAM (KLONOPIN) 0.5 MG tablet TAKE 1 TABLET BY MOUTH EVERY NIGHT AT BEDTIME AS NEEDED. 30 tablet 0  . clopidogrel (PLAVIX) 75 MG tablet Take 1 tablet (75  mg total) by mouth daily with breakfast. 90 tablet 3  . lisinopril (PRINIVIL,ZESTRIL) 5 MG tablet TAKE 1/2 TABLET BY MOUTH EVERY DAY. 45 tablet 3  . nitroGLYCERIN (NITROSTAT) 0.4 MG SL tablet Place 1 tablet (0.4 mg total) under the tongue every 5 (five) minutes as needed for chest pain. 25 tablet 3  . omega-3 acid ethyl esters (LOVAZA) 1 g capsule Take 1 g by mouth daily.     No current facility-administered medications for this visit.    Allergies:   Codeine    Social  History:  The patient  reports that he quit smoking about 2 months ago. His smoking use included Cigarettes. He has a 45 pack-year smoking history. He has never used smokeless tobacco. He reports that he uses illicit drugs (Marijuana). He reports that he does not drink alcohol.   Family History:  The patient's family history includes Alcohol abuse in his father; Heart disease in his brother and sister; Hyperlipidemia in his brother, father, mother, sister, and sister. There is no history of Diabetes or Hypertension.    ROS:  Please see the history of present illness.   Otherwise, review of systems are positive for none.   All other systems are reviewed and negative.    PHYSICAL EXAM: VS:  BP 122/78 mmHg  Pulse 56  Ht  (1.778 m)  Wt 189 lb 7 oz (85.928 kg)  BMI 27.18 kg/m2 , BMI Body mass index is 27.18 kg/(m^2). GEN: Well nourished, well developed, in no acute distress HEENT: normal Neck: no JVD, carotid bruits, or masses Cardiac: RRR; no murmurs, rubs, or gallops,no edema  Respiratory:  clear to auscultation bilaterally, normal work of breathing GI: soft, nontender, nondistended, + BS MS: no deformity or atrophy Skin: warm and dry, no rash Neuro:  Strength and sensation are intact Psych: euthymic mood, full affect Vascular: Femoral pulses are normal. Distal pulses are not palpable.  EKG:  EKG is not ordered today.    Recent Labs: 03/15/2015: ALT 18 03/16/2015: BUN 10; Creatinine, Ser 0.94; Hemoglobin 13.8; Platelets 146*; Potassium 3.9; Sodium 140    Lipid Panel    Component Value Date/Time   CHOL 137 03/14/2015 0624   TRIG 293* 03/14/2015 0624   HDL 27* 03/14/2015 0624   CHOLHDL 5.1 03/14/2015 0624   VLDL 59* 03/14/2015 0624   LDLCALC 51 03/14/2015 0624   LDLDIRECT 69.0 06/16/2014 1551      Wt Readings from Last 3 Encounters:  05/23/15 189 lb 7 oz (85.928 kg)  03/23/15 175 lb (79.379 kg)  03/16/15 171 lb 8.3 oz (77.8 kg)        ASSESSMENT AND  PLAN:  1.  Peripheral arterial disease: Patient has known occluded SFA bilaterally with moderate bilateral calf claudication. I requested lower extremity arterial Doppler to ensure stability in ABI. Otherwise continue medical therapy.  2. Coronary artery disease status post recent angioplasty and drug-eluting stent placement of SVG to second diagonal. Continue dual antiplatelet therapy and aggressive treatment of risk factors.  3. Hyperlipidemia: Continue treatment with atorvastatin. Most recent LDL was 51.  4. Essential hypertension: Blood pressure is well controlled on current medications.      Disposition:   FU with me in 6 months    Signed,  Lorine Bears, MD  05/26/2015 7:10 PM    [facility]

## 2015-05-30 ENCOUNTER — Ambulatory Visit (HOSPITAL_COMMUNITY)
Admission: RE | Admit: 2015-05-30 | Discharge: 2015-05-30 | Disposition: A | Discharge status: Home / Self Care | Payer: Medicare PPO | Source: Ambulatory Visit | Attending: Cardiovascular Disease | Admitting: Cardiovascular Disease

## 2015-05-30 DIAGNOSIS — I739 Peripheral vascular disease, unspecified: Secondary | ICD-10-CM | POA: Diagnosis not present

## 2015-05-30 DIAGNOSIS — R938 Abnormal findings on diagnostic imaging of other specified body structures: Secondary | ICD-10-CM | POA: Diagnosis not present

## 2015-06-07 ENCOUNTER — Other Ambulatory Visit: Payer: Self-pay | Admitting: Internal Medicine

## 2015-06-09 ENCOUNTER — Telehealth: Payer: Self-pay | Admitting: Cardiovascular Disease

## 2015-06-09 NOTE — Telephone Encounter (Signed)
Pt called [place] office trying to get results of recent ABI. [facility] and left message to CB however, he thinks he has the wrong number. Reviewed ABI results w/pt who verbalized understanding. Pt had no further questions

## 2015-06-20 ENCOUNTER — Encounter: Payer: Self-pay | Admitting: Internal Medicine

## 2015-06-20 ENCOUNTER — Encounter: Payer: Self-pay | Admitting: Radiology

## 2015-06-20 ENCOUNTER — Ambulatory Visit (INDEPENDENT_AMBULATORY_CARE_PROVIDER_SITE_OTHER): Payer: Medicare PPO | Admitting: Internal Medicine

## 2015-06-20 VITALS — BP 120/70 | HR 52 | Temp 97.5°F | Ht 68.0 in | Wt 184.0 lb

## 2015-06-20 DIAGNOSIS — I2510 Atherosclerotic heart disease of native coronary artery without angina pectoris: Secondary | ICD-10-CM | POA: Diagnosis not present

## 2015-06-20 DIAGNOSIS — Z0000 Encounter for general adult medical examination without abnormal findings: Secondary | ICD-10-CM | POA: Diagnosis not present

## 2015-06-20 DIAGNOSIS — I739 Peripheral vascular disease, unspecified: Secondary | ICD-10-CM | POA: Diagnosis not present

## 2015-06-20 DIAGNOSIS — Z1211 Encounter for screening for malignant neoplasm of colon: Secondary | ICD-10-CM

## 2015-06-20 DIAGNOSIS — I480 Paroxysmal atrial fibrillation: Secondary | ICD-10-CM | POA: Diagnosis not present

## 2015-06-20 DIAGNOSIS — M150 Primary generalized (osteo)arthritis: Secondary | ICD-10-CM

## 2015-06-20 DIAGNOSIS — I5020 Unspecified systolic (congestive) heart failure: Secondary | ICD-10-CM | POA: Insufficient documentation

## 2015-06-20 DIAGNOSIS — I5022 Chronic systolic (congestive) heart failure: Secondary | ICD-10-CM

## 2015-06-20 DIAGNOSIS — M159 Polyosteoarthritis, unspecified: Secondary | ICD-10-CM | POA: Insufficient documentation

## 2015-06-20 DIAGNOSIS — J439 Emphysema, unspecified: Secondary | ICD-10-CM

## 2015-06-20 LAB — CBC WITH DIFFERENTIAL/PLATELET
BASOS ABS: 0 10*3/uL (ref 0.0–0.1)
Basophils Relative: 0.5 % (ref 0.0–3.0)
Eosinophils Absolute: 0.5 10*3/uL (ref 0.0–0.7)
Eosinophils Relative: 6.8 % — ABNORMAL HIGH (ref 0.0–5.0)
HCT: 40.3 % (ref 39.0–52.0)
HEMOGLOBIN: 13.6 g/dL (ref 13.0–17.0)
LYMPHS ABS: 2.6 10*3/uL (ref 0.7–4.0)
LYMPHS PCT: 36.5 % (ref 12.0–46.0)
MCHC: 33.9 g/dL (ref 30.0–36.0)
MCV: 92.1 fl (ref 78.0–100.0)
MONOS PCT: 8 % (ref 3.0–12.0)
Monocytes Absolute: 0.6 10*3/uL (ref 0.1–1.0)
NEUTROS PCT: 48.2 % (ref 43.0–77.0)
Neutro Abs: 3.5 10*3/uL (ref 1.4–7.7)
Platelets: 172 10*3/uL (ref 150.0–400.0)
RBC: 4.38 Mil/uL (ref 4.22–5.81)
RDW: 13 % (ref 11.5–15.5)
WBC: 7.3 10*3/uL (ref 4.0–10.5)

## 2015-06-20 LAB — COMPREHENSIVE METABOLIC PANEL
ALBUMIN: 4.4 g/dL (ref 3.5–5.2)
ALK PHOS: 104 U/L (ref 39–117)
ALT: 23 U/L (ref 0–53)
AST: 17 U/L (ref 0–37)
BILIRUBIN TOTAL: 0.7 mg/dL (ref 0.2–1.2)
BUN: 16 mg/dL (ref 6–23)
CO2: 29 mEq/L (ref 19–32)
Calcium: 10.2 mg/dL (ref 8.4–10.5)
Chloride: 104 mEq/L (ref 96–112)
Creatinine, Ser: 0.91 mg/dL (ref 0.40–1.50)
GFR: 89.05 mL/min (ref >60.00)
GLUCOSE: 97 mg/dL (ref 70–99)
Potassium: 4.5 mEq/L (ref 3.5–5.1)
Sodium: 137 mEq/L (ref 135–145)
TOTAL PROTEIN: 7.4 g/dL (ref 6.0–8.3)

## 2015-06-20 MED ORDER — ZOSTER VACCINE LIVE 19400 UNT/0.65ML ~~LOC~~ SOLR: 0.6500 mL | Freq: Once | SUBCUTANEOUS | Status: DC

## 2015-06-20 MED ORDER — CLONAZEPAM 0.5 MG PO TABS: ORAL_TABLET | ORAL | Status: DC

## 2015-06-20 NOTE — Progress Notes (Signed)
Subjective:    Patient ID: Franklin Marks, male    DOB: 1951-01-02, 65 y.o.   MRN: [id number]  HPI Here for initial Medicare wellness visit and follow up of chronic health issues Reviewed form and advanced directives Reviewed other doctors Stopped smoking Rare beer Hearing and vision are fine No falls No depression or anhedonia Independent with instrumental ADLs No apparent cognitive changes  Will be having work done on his teeth Had to delay due to coronary stent in December On antiplatelet therapy---dual Waiting till May for this  Still with pain in hips and knees Had repeat PAD evaluation--- looks okay Walks 1-1.5 miles a day. Pain in calves--but mostly knees and hips Discussed that tylenol would be okay  Sleep is still not good Not as much trouble with RLS Will initiate but then wakes Clonazepam helps  Did finally stop smoking totally Wife quit also No cough or wheeze Does notice some difference in breathing--like if lying prone in bed at times No change in exercise tolerance  Did have CP and went to hospital--wound up with stent No chest pain now. Hasn't needed nitro No palpitations No dizziness or syncope No edema  Current Outpatient Prescriptions on File Prior to Visit  Medication Sig Dispense Refill  . aspirin 81 MG chewable tablet Chew 1 tablet (81 mg total) by mouth daily.    [name] atorvastatin (LIPITOR) 80 MG tablet Take 1 tablet (80 mg total) by mouth daily at 6 PM. 90 tablet 3  . carvedilol (COREG) 6.25 MG tablet TAKE 1/2 TABLET TWICE DAILY WITH A MEAL 90 tablet 0  . clonazePAM (KLONOPIN) 0.5 MG tablet TAKE 1 TABLET BY MOUTH EVERY NIGHT AT BEDTIME AS NEEDED. 30 tablet 0  . clopidogrel (PLAVIX) 75 MG tablet Take 1 tablet (75 mg total) by mouth daily with breakfast. 90 tablet 3  . lisinopril (PRINIVIL,ZESTRIL) 5 MG tablet TAKE 1/2 TABLET EVERY DAY 45 tablet 0  . nitroGLYCERIN (NITROSTAT) 0.4 MG SL tablet Place 1 tablet (0.4 mg total) under the tongue every 5  (five) minutes as needed for chest pain. 25 tablet 3  . omega-3 acid ethyl esters (LOVAZA) 1 g capsule Take 1 g by mouth daily.     No current facility-administered medications on file prior to visit.    Allergies  Allergen Reactions  . Codeine Nausea And Vomiting    Past Medical History  Diagnosis Date  . CAD (coronary artery disease)     s/p CABG 2001  . COPD (chronic obstructive pulmonary disease) (HCC)     ongoing tobacco use  . GERD (gastroesophageal reflux disease)   . Anemia   . NSTEMI (non-ST elevated myocardial infarction) (HCC) 01/10/2012    Associated with ventricular fibrillation  . Ischemic cardiomyopathy 01/10/2012    LVEF 35-40%, mild conc hypertrophy, servere HK of anteroseptum, borderline RVH  . Atrial fibrillation (HCC) 01/13/2012    Past Surgical History  Procedure Laterality Date  . Cardiac catheterization  2011    At [facility]  . Cardiac catheterization  12/2011    prox LAD occlusion, ostial RCA occlusion, 30% prox LCx stenosis, LIMA-LAD: 80% post-anastamosis lesion, SVG-Dx2: old occlusion w/ thrombus (noted on prior 2011 cath at [facility]), SVG-Dx: patent, SVG-RCA: patent, diffuse irregs, 80-90% PDA/PLA lesions patent  . Coronary artery bypass graft  2001  . Left heart catheterization with coronary angiogram N/A 01/10/2012    Procedure: LEFT HEART CATHETERIZATION WITH CORONARY ANGIOGRAM;  Surgeon: Lennette Bihari, MD;  Location: [facility] C[facility] LAB;  Service: Cardiovascular;  Laterality: N/A;  . Cardiac catheterization N/A 03/15/2015    Procedure: Left Heart Cath and Cors/Grafts Angiography;  Surgeon: Kathleene Hazelhristopher D McAlhany, MD;  [facility];  Service: Cardiovascular;  Laterality: N/A;  . Cardiac catheterization N/A 03/15/2015    Procedure: Coronary Stent Intervention;  Surgeon: Kathleene Hazelhristopher D McAlhany, MD;  [facility];  Service: Cardiovascular;  Laterality: N/A;  svg to diagnoal 2    Family History  Problem Relation Age of Onset  .  Hyperlipidemia Mother   . Alcohol abuse Father   . Hyperlipidemia Father   . Heart disease Sister   . Hyperlipidemia Sister   . Heart disease Brother   . Hyperlipidemia Brother   . Diabetes Neg Hx   . Hypertension Neg Hx   . Hyperlipidemia Sister     Social History   Social History  . Marital Status: Married    Spouse Name: N/A  . Number of Children: 4  . Years of Education: N/A   Occupational History  . Management --now disabled    Social History Main Topics  . Smoking status: Former Smoker -- 1.00 packs/day for 45 years    Types: Cigarettes    Quit date: 03/13/2015  . Smokeless tobacco: Never Used  . Alcohol Use: 0.0 oz/week    0 Standard drinks or equivalent per week     Comment: really rare beer  . Drug Use: Yes    Special: Marijuana  . Sexual Activity: Not Currently   Other Topics Concern  . Not on file   Social History Narrative   Married twice   3 children from 1st marriage-- 1 from second   Worked as Investment banker, [employer] management      Has living will   Wife has health care POA   Would accept resuscitation   No tube feeds if cognitively unaware   Review of Systems Has gained ~15# since last year Plans to get part time job and be more busy--once bad teeth are taken care of Wears seat belt Bowels are okay. No blood. Voids okay. Stream is okay. Nocturia x 2-3 per night. Daytime is okay.  No sexual problems No rash or suspicious skin lesions    Objective:   Physical Exam  Constitutional: He is oriented to person, place, and time. He appears well-developed and well-nourished. No distress.  HENT:  Mouth/Throat: Oropharynx is clear and moist. No oropharyngeal exudate.  Neck: Normal range of motion. Neck supple. No thyromegaly present.  Cardiovascular: Regular rhythm.   Slow but regular Feet cool--just slight pulse on left  Pulmonary/Chest: Effort normal. No respiratory distress. He has no wheezes. He has no rales.  Decreased breath sounds but clear    Abdominal: Soft. There is no tenderness.  Musculoskeletal: He exhibits no edema or tenderness.  Fair ROM of hips and knees No sig crepitus  Lymphadenopathy:    He has no cervical adenopathy.  Neurological: He is alert and oriented to person, place, and time.  President--"Trump, Phillips OdorOBama, Bush" [id number] D-l-[id number]-w Recall 3/3  Skin: No rash noted. No erythema.  Psychiatric: He has a normal mood and affect. His behavior is normal.          Assessment & Plan:

## 2015-06-20 NOTE — Assessment & Plan Note (Signed)
No intervention planned On statin, ASA

## 2015-06-20 NOTE — Assessment & Plan Note (Signed)
Doing well since stent On appropriate regimen

## 2015-06-20 NOTE — Assessment & Plan Note (Signed)
Mild symptoms in knees and hips Discussed tylenol and quad strengthening

## 2015-06-20 NOTE — Assessment & Plan Note (Signed)
I have personally reviewed the Medicare Annual Wellness questionnaire and have noted 1. The patient's medical and social history 2. Their use of alcohol, tobacco or illicit drugs 3. Their current medications and supplements 4. The patient's functional ability including ADL's, fall risks, home safety risks and hearing or visual             impairment. 5. Diet and physical activities 6. Evidence for depression or mood disorders  The patients weight, height, BMI and visual acuity have been recorded in the chart I have made referrals, counseling and provided education to the patient based review of the above and I have provided the pt with a written personalized care plan for preventive services.  I have provided you with a copy of your personalized plan for preventive services. Please take the time to review along with your updated medication list.  Discussed PSA---will not check Will do FIT Rx for zostavax

## 2015-06-20 NOTE — Assessment & Plan Note (Signed)
Has stopped smoking May have some limitations but Rx not needed

## 2015-06-20 NOTE — Progress Notes (Signed)
Pre visit review using our clinic review tool, if applicable. No additional management support is needed unless otherwise documented below in the visit note. 

## 2015-06-20 NOTE — Assessment & Plan Note (Signed)
Mildly reduced EF Follows with cardiology

## 2015-06-20 NOTE — Addendum Note (Signed)
Addended by: Alvina ChouWALSH, TERRI J on: 06/20/2015 01:05 PM   Modules accepted: Orders

## 2015-06-20 NOTE — Assessment & Plan Note (Signed)
Paroxysmal ?very low burden--- ?only after arrest Will leave decisions about this to cardiology

## 2015-08-15 ENCOUNTER — Telehealth: Payer: Self-pay | Admitting: Cardiovascular Disease

## 2015-08-15 ENCOUNTER — Other Ambulatory Visit: Payer: Self-pay | Admitting: Internal Medicine

## 2015-08-15 NOTE — Telephone Encounter (Signed)
Received request for medication modification from [employer]. Pt scheduled for full mouth extractions w/immedicate denture delivery. Placed in MD in basket

## 2015-08-17 NOTE — Telephone Encounter (Signed)
Faxed [employer] [facility] request to Dr. Orson AloeHenderson, [phone]

## 2015-09-06 ENCOUNTER — Other Ambulatory Visit: Payer: Self-pay | Admitting: Internal Medicine

## 2015-09-06 NOTE — Telephone Encounter (Signed)
Approved: 30 x 0 

## 2015-09-06 NOTE — Telephone Encounter (Signed)
Last filled 06-20-15 #30 Last OV 06-20-15 No Future OV

## 2015-09-06 NOTE — Telephone Encounter (Signed)
Left refill on voice mail at pharmacy  

## 2015-09-18 ENCOUNTER — Telehealth: Payer: Self-pay | Admitting: Cardiovascular Disease

## 2015-09-18 NOTE — Telephone Encounter (Signed)
Medical Consultation request from [facility] [employer] received and placed in MD basket Pt to have full mouth extraction w/dentures.

## 2015-09-18 NOTE — Telephone Encounter (Signed)
Re-faxed medical consultation to [employer], [phone]

## 2015-11-28 ENCOUNTER — Ambulatory Visit: Payer: Medicare PPO | Admitting: Cardiovascular Disease

## 2016-02-06 ENCOUNTER — Ambulatory Visit (INDEPENDENT_AMBULATORY_CARE_PROVIDER_SITE_OTHER): Payer: PPO | Admitting: Cardiovascular Disease

## 2016-02-06 ENCOUNTER — Encounter: Payer: Self-pay | Admitting: Cardiovascular Disease

## 2016-02-06 VITALS — BP 100/58 | HR 49 | Ht 68.0 in | Wt 158.6 lb

## 2016-02-06 DIAGNOSIS — E785 Hyperlipidemia, unspecified: Secondary | ICD-10-CM

## 2016-02-06 DIAGNOSIS — I739 Peripheral vascular disease, unspecified: Secondary | ICD-10-CM

## 2016-02-06 DIAGNOSIS — I2510 Atherosclerotic heart disease of native coronary artery without angina pectoris: Secondary | ICD-10-CM

## 2016-02-06 DIAGNOSIS — I10 Essential (primary) hypertension: Secondary | ICD-10-CM

## 2016-02-06 LAB — HEPATIC FUNCTION PANEL
ALBUMIN: 3.9 g/dL (ref 3.6–5.1)
ALT: 13 U/L (ref 9–46)
AST: 15 U/L (ref 10–35)
Alkaline Phosphatase: 87 U/L (ref 40–115)
BILIRUBIN INDIRECT: 0.5 mg/dL (ref 0.2–1.2)
Bilirubin, Direct: 0.1 mg/dL (ref <=0.2)
Total Bilirubin: 0.6 mg/dL (ref 0.2–1.2)
Total Protein: 6.4 g/dL (ref 6.1–8.1)

## 2016-02-06 LAB — LIPID PANEL
CHOLESTEROL: 94 mg/dL (ref <200)
HDL: 31 mg/dL — AB (ref >40)
LDL Cholesterol: 46 mg/dL (ref <100)
TRIGLYCERIDES: 85 mg/dL (ref <150)
Total CHOL/HDL Ratio: 3 Ratio (ref <5.0)
VLDL: 17 mg/dL (ref <30)

## 2016-02-06 NOTE — Patient Instructions (Signed)
Medication Instructions:  Your physician recommends that you continue on your current medications as directed. Please refer to the Current Medication list given to you today.  Labwork: Your physician recommends that you have lab work today: LIPID and LIVER  Testing/Procedures: No new orders.   Follow-Up: Your physician wants you to follow-up in: 6 MONTHS with Dr Arida.  You will receive a reminder letter in the mail two months in advance. If you don't receive a letter, please call our office to schedule the follow-up appointment.   Any Other Special Instructions Will Be Listed Below (If Applicable).     If you need a refill on your cardiac medications before your next appointment, please call your pharmacy.   

## 2016-02-06 NOTE — Progress Notes (Signed)
Cardiology Office Note   Date:  02/06/2016   ID:  Franklin Marks, DOB 1950/08/25, MRN [id number]  PCP:  Tillman Abideichard Letvak, MD  Cardiologist:   Lorine BearsMuhammad Arida, MD   Chief Complaint  Patient presents with  . Follow-up      History of Present Illness: Franklin Marks is a 65 y.o. male who presents for  a followup visit regarding peripheral arterial disease and coronary artery disease. He has known history of cardiac arrest occurring in the setting of coronary artery disease and chest pain. He is status post CABG in 2001. He has known history of peripheral arterial disease with mildly reduced ABI bilaterally and  chronic total occlusion of the SFA on both sides with reconstitution distally via collaterals.  He also has restless leg syndrome and was started on clonazepam.  He was hospitalized in December 2016 with non-ST elevation myocardial infarction. He underwent PCI and drug-eluting stent placement of SVG to second diagonal. EF was 35-45%. Most recent ABI in March was in the 0.9 range.  He has been doing extremely well with no chest pain, shortness of breath or claudication. He describes bilateral knee pain with walking. No side effects with medications. He does not smoke cigarettes but does occasional cigars not very often. He started a part-time job at [employer] recently. His walking has improved from before.  Past Medical History:  Diagnosis Date  . Anemia   . Atrial fibrillation (HCC) 01/13/2012  . CAD (coronary artery disease)    s/p CABG 2001  . COPD (chronic obstructive pulmonary disease) (HCC)    ongoing tobacco use  . GERD (gastroesophageal reflux disease)   . Ischemic cardiomyopathy 01/10/2012   LVEF 35-40%, mild conc hypertrophy, servere HK of anteroseptum, borderline RVH  . NSTEMI (non-ST elevated myocardial infarction) (HCC) 01/10/2012   Associated with ventricular fibrillation    Past Surgical History:  Procedure Laterality Date  . CARD[facility]  2011   [facility]  . CARDIAC CATHETERIZATION  12/2011   prox LAD occlusion, ostial RCA occlusion, 30% prox LCx stenosis, LIMA-LAD: 80% post-anastamosis lesion, SVG-Dx2: old occlusion w/ thrombus (noted on prior [facility] cath [facility]), SVG-Dx: patent, SVG-RCA: patent, diffuse irregs, 80-90% PDA/PLA lesions patent  . CARDIAC CATHETERIZATION N/A 03/15/2015   Procedure: Left Heart Cath and Cors/Grafts Angiography;  Surgeon: Kathleene Hazelhristopher D McAlhany, MD;  [facility];  Service: Cardiovascular;  Laterality: N/A;  . CARDIAC CATHETERIZATION N/A 03/15/2015   Procedure: Coronary Stent Intervention;  Surgeon: Kathleene Hazelhristopher D McAlhany, MD;  Location: [facility] INVASIVE CV LAB;  Service: Cardiovascular;  Laterality: N/A;  svg to diagnoal 2  . CORONARY ARTERY BYPASS GRAFT  2001  . LEFT HEART CATHETERIZATION WITH CORONARY ANGIOGRAM N/A 01/10/2012   Procedure: LEFT HEART CATHETERIZATION WITH CORONARY ANGIOGRAM;  Surgeon: Lennette Biharihomas A Kelly, MD;  [facility];  Service: Cardiovascular;  Laterality: N/A;     Current Outpatient Prescriptions  Medication Sig Dispense Refill  . aspirin 81 MG chewable tablet Chew 1 tablet (81 mg total) by mouth daily.    [name]. atorvastatin (LIPITOR) 80 MG tablet Take 1 tablet (80 mg total) by mouth daily at 6 PM. 90 tablet 3  . carvedilol (COREG) 6.25 MG tablet TAKE 1/2 TABLET TWICE DAILY WITH A MEAL 90 tablet 3  . clonazePAM (KLONOPIN) 0.5 MG tablet TAKE 1 TABLET BY MOUTH EVERY NIGHT AT BEDTIME AS NEEDED. 30 tablet 0  . clopidogrel (PLAVIX) 75 MG tablet Take 1 tablet (75 mg total) by mouth daily  with breakfast. 90 tablet 3  . lisinopril (PRINIVIL,ZESTRIL) 5 MG tablet TAKE 1/2 TABLET EVERY DAY 45 tablet 3  . nitroGLYCERIN (NITROSTAT) 0.4 MG SL tablet Place 1 tablet (0.4 mg total) under the tongue every 5 (five) minutes as needed for chest pain. 25 tablet 3  . omega-3 acid ethyl esters (LOVAZA) 1 g capsule Take 1 g by mouth daily.     No current facility-administered medications for this  visit.     Allergies:   Codeine    Social History:  The patient  reports that he quit smoking about 10 months ago. His smoking use included Cigarettes. He has a 45.00 pack-year smoking history. He has never used smokeless tobacco. He reports that he drinks alcohol. He reports that he uses drugs, including Marijuana.   Family History:  The patient's family history includes Alcohol abuse in his father; Heart disease in his brother and sister; Hyperlipidemia in his brother, father, mother, sister, and sister.    ROS:  Please see the history of present illness.   Otherwise, review of systems are positive for none.   All other systems are reviewed and negative.    PHYSICAL EXAM: VS:  Ht 5\' 8"  (1.727 m)   Wt 158 lb 9.6 oz (71.9 kg)   BMI 24.12 kg/m  , BMI Body mass index is 24.12 kg/m. GEN: Well nourished, well developed, in no acute distress  HEENT: normal  Neck: no JVD, carotid bruits, or masses Cardiac: RRR; no murmurs, rubs, or gallops,no edema  Respiratory:  clear to auscultation bilaterally, normal work of breathing GI: soft, nontender, nondistended, + BS MS: no deformity or atrophy  Skin: warm and dry, no rash Neuro:  Strength and sensation are intact Psych: euthymic mood, full affect Vascular: Femoral pulses are normal. Distal pulses are not palpable.  EKG:  EKG is ordered today. EKG showed sinus bradycardia with first-degree AV block with right bundle branch block.    Recent Labs: 06/20/2015: ALT 23; BUN 16; Creatinine, Ser 0.91; Hemoglobin 13.6; Platelets 172.0; Potassium 4.5; Sodium 137    Lipid Panel    Component Value Date/Time   CHOL 137 03/14/2015 0624   TRIG 293 (H) 03/14/2015 0624   HDL 27 (L) 03/14/2015 0624   CHOLHDL 5.1 03/14/2015 0624   VLDL 59 (H) 03/14/2015 0624   LDLCALC 51 03/14/2015 0624   LDLDIRECT 69.0 06/16/2014 1551      Wt Readings from Last 3 Encounters:  02/06/16 158 lb 9.6 oz (71.9 kg)  06/20/15 184 lb (83.5 kg)  05/23/15 189 lb 7 oz  (85.9 kg)        ASSESSMENT AND PLAN:  1.  Peripheral arterial disease: Patient has known occluded SFA bilaterally .He denies claudication at the present time. Continue medical therapy.  2. Coronary artery disease status post \ angioplasty and drug-eluting stent placement of SVG to second diagonal in December 2016.  Due to this and peripheral arterial disease, I plan on keeping him long-term on dual antiplatelet therapy as tolerated.  3. Hyperlipidemia: Continue treatment with atorvastatin. Most recent LDL was 51. His triglyceride was elevated. I discussed with him the importance of low carbohydrate diet. He takes fish oil 1 g daily. Check fasting lipid and liver profile today.  4. Essential hypertension: Blood pressure is well controlled on current medications.   Disposition:   FU with me in 6 months    Signed,  Lorine BearsMuhammad Arida, MD  02/06/2016 8:08 AM    [facility]

## 2016-05-04 ENCOUNTER — Other Ambulatory Visit: Payer: Self-pay | Admitting: Cardiovascular Disease

## 2016-05-07 ENCOUNTER — Other Ambulatory Visit: Payer: Self-pay | Admitting: Internal Medicine

## 2016-05-08 NOTE — Telephone Encounter (Signed)
Last filled 09-06-15 #30 Last OV 06-20-15 No Future OV

## 2016-05-08 NOTE — Telephone Encounter (Signed)
Left refill on voice mail at pharmacy  Forward to the front to schedule CPE/MCW

## 2016-05-08 NOTE — Telephone Encounter (Signed)
Approved: #30 x 0 Have him set up physical or Medicare wellness within the next 6 months

## 2016-05-09 NOTE — Telephone Encounter (Signed)
I left msg for pt to call the office to get sch for AWV.

## 2016-05-17 ENCOUNTER — Other Ambulatory Visit: Payer: Self-pay | Admitting: Internal Medicine

## 2016-05-29 ENCOUNTER — Other Ambulatory Visit: Payer: Self-pay | Admitting: Cardiovascular Disease

## 2016-05-29 DIAGNOSIS — I739 Peripheral vascular disease, unspecified: Secondary | ICD-10-CM

## 2016-06-06 ENCOUNTER — Ambulatory Visit (HOSPITAL_COMMUNITY)
Admission: RE | Admit: 2016-06-06 | Discharge: 2016-06-06 | Disposition: A | Discharge status: Home / Self Care | Payer: PPO | Source: Ambulatory Visit | Attending: Cardiovascular Disease | Admitting: Cardiovascular Disease

## 2016-06-06 DIAGNOSIS — Z87891 Personal history of nicotine dependence: Secondary | ICD-10-CM | POA: Diagnosis not present

## 2016-06-06 DIAGNOSIS — E785 Hyperlipidemia, unspecified: Secondary | ICD-10-CM | POA: Diagnosis not present

## 2016-06-06 DIAGNOSIS — J449 Chronic obstructive pulmonary disease, unspecified: Secondary | ICD-10-CM | POA: Insufficient documentation

## 2016-06-06 DIAGNOSIS — I2510 Atherosclerotic heart disease of native coronary artery without angina pectoris: Secondary | ICD-10-CM | POA: Insufficient documentation

## 2016-06-06 DIAGNOSIS — I739 Peripheral vascular disease, unspecified: Secondary | ICD-10-CM | POA: Diagnosis not present

## 2016-06-26 ENCOUNTER — Other Ambulatory Visit: Payer: Self-pay | Admitting: Cardiovascular Disease

## 2016-06-26 NOTE — Telephone Encounter (Signed)
Rx(s) sent to pharmacy electronically.  

## 2016-06-26 NOTE — Telephone Encounter (Signed)
Refill Request.  

## 2016-07-04 ENCOUNTER — Other Ambulatory Visit: Payer: Self-pay | Admitting: *Deleted

## 2016-07-04 MED ORDER — CARVEDILOL 6.25 MG PO TABS: 3.1250 mg | ORAL_TABLET | Freq: Two times a day (BID) | ORAL | 1 refills | Status: DC

## 2016-07-11 ENCOUNTER — Ambulatory Visit (INDEPENDENT_AMBULATORY_CARE_PROVIDER_SITE_OTHER): Payer: PPO | Admitting: Internal Medicine

## 2016-07-11 ENCOUNTER — Encounter: Payer: Self-pay | Admitting: Internal Medicine

## 2016-07-11 VITALS — BP 116/70 | HR 68 | Temp 97.9°F | Ht 67.5 in | Wt 156.0 lb

## 2016-07-11 DIAGNOSIS — J439 Emphysema, unspecified: Secondary | ICD-10-CM

## 2016-07-11 DIAGNOSIS — I739 Peripheral vascular disease, unspecified: Secondary | ICD-10-CM

## 2016-07-11 DIAGNOSIS — Z23 Encounter for immunization: Secondary | ICD-10-CM

## 2016-07-11 DIAGNOSIS — I5022 Chronic systolic (congestive) heart failure: Secondary | ICD-10-CM | POA: Diagnosis not present

## 2016-07-11 DIAGNOSIS — I480 Paroxysmal atrial fibrillation: Secondary | ICD-10-CM | POA: Diagnosis not present

## 2016-07-11 DIAGNOSIS — Z0000 Encounter for general adult medical examination without abnormal findings: Secondary | ICD-10-CM

## 2016-07-11 LAB — LIPID PANEL
CHOLESTEROL: 84 mg/dL (ref 0–200)
HDL: 30.6 mg/dL — ABNORMAL LOW (ref >39.00)
LDL CALC: 24 mg/dL (ref 0–99)
NonHDL: 53.83
TRIGLYCERIDES: 148 mg/dL (ref 0.0–149.0)
Total CHOL/HDL Ratio: 3
VLDL: 29.6 mg/dL (ref 0.0–40.0)

## 2016-07-11 LAB — CBC WITH DIFFERENTIAL/PLATELET
Basophils Absolute: 0.1 10*3/uL (ref 0.0–0.1)
Basophils Relative: 0.5 % (ref 0.0–3.0)
EOS ABS: 0.4 10*3/uL (ref 0.0–0.7)
Eosinophils Relative: 3.6 % (ref 0.0–5.0)
HCT: 40.5 % (ref 39.0–52.0)
Hemoglobin: 13.7 g/dL (ref 13.0–17.0)
Lymphocytes Relative: 23.3 % (ref 12.0–46.0)
Lymphs Abs: 2.6 10*3/uL (ref 0.7–4.0)
MCHC: 33.9 g/dL (ref 30.0–36.0)
MCV: 91.9 fl (ref 78.0–100.0)
MONO ABS: 0.9 10*3/uL (ref 0.1–1.0)
Monocytes Relative: 7.7 % (ref 3.0–12.0)
Neutro Abs: 7.2 10*3/uL (ref 1.4–7.7)
Neutrophils Relative %: 64.9 % (ref 43.0–77.0)
Platelets: 179 10*3/uL (ref 150.0–400.0)
RBC: 4.4 Mil/uL (ref 4.22–5.81)
RDW: 13.7 % (ref 11.5–15.5)
WBC: 11.1 10*3/uL — AB (ref 4.0–10.5)

## 2016-07-11 LAB — COMPREHENSIVE METABOLIC PANEL
ALBUMIN: 4.1 g/dL (ref 3.5–5.2)
ALK PHOS: 106 U/L (ref 39–117)
ALT: 12 U/L (ref 0–53)
AST: 12 U/L (ref 0–37)
BILIRUBIN TOTAL: 0.6 mg/dL (ref 0.2–1.2)
BUN: 14 mg/dL (ref 6–23)
CO2: 29 mEq/L (ref 19–32)
CREATININE: 0.85 mg/dL (ref 0.40–1.50)
Calcium: 9.4 mg/dL (ref 8.4–10.5)
Chloride: 106 mEq/L (ref 96–112)
GFR: 96.02 mL/min (ref >60.00)
Glucose, Bld: 80 mg/dL (ref 70–99)
Potassium: 4.4 mEq/L (ref 3.5–5.1)
Sodium: 137 mEq/L (ref 135–145)
TOTAL PROTEIN: 7 g/dL (ref 6.0–8.3)

## 2016-07-11 MED ORDER — CLONAZEPAM 0.5 MG PO TABS: 0.5000 mg | ORAL_TABLET | Freq: Every evening | ORAL | 0 refills | Status: DC | PRN

## 2016-07-11 MED ORDER — NITROGLYCERIN 0.4 MG SL SUBL: 0.4000 mg | SUBLINGUAL_TABLET | SUBLINGUAL | 0 refills | Status: DC | PRN

## 2016-07-11 NOTE — Progress Notes (Signed)
Subjective:    Patient ID: Franklin Marks, male    DOB: Jan 14, 1951, 66 y.o.   MRN: [id number]  HPI Here for Medicare wellness visit and follow up of chronic health conditions Reviewed form and advanced directives Reviewed other doctors---Arida is cardiologist No alcohol Quit smoking last year. No tobacco Walking regularly--- discussed resistance training No falls No depression or anhedonia Vision and hearing are fine Independent with instrumental ADLs Memory seems fine  Lost 30# in the past year Had teeth pulled and couldn't eat much---just soft foods Now with full dentures Now weight has stabilized  Some trouble sleeping at night Uses the clonazepam--and this helps (but won't take nightly) Has some pain in knees--they pop at night Discussed tylenol and melatonin  No chest pain No dizziness or syncope No palpitations No edema No headaches Has not had claudication lately---did have follow up ABI recently  No cough or wheezing  Current Outpatient Prescriptions on File Prior to Visit  Medication Sig Dispense Refill  . aspirin 81 MG chewable tablet Chew 1 tablet (81 mg total) by mouth daily.    [name] atorvastatin (LIPITOR) 80 MG tablet Take 1 tablet (80 mg total) by mouth daily at 6 PM. 90 tablet 3  . carvedilol (COREG) 6.25 MG tablet Take 0.5 tablets (3.125 mg total) by mouth 2 (two) times daily with a meal. 90 tablet 1  . clonazePAM (KLONOPIN) 0.5 MG tablet TAKE 1 TABLET BY MOUTH EVERY NIGHT AT BEDTIME AS NEEDED. 30 tablet 0  . clopidogrel (PLAVIX) 75 MG tablet Take 1 tablet (75 mg total) by mouth daily with breakfast. 90 tablet 3  . lisinopril (PRINIVIL,ZESTRIL) 5 MG tablet TAKE 1/2 TABLET EVERY DAY 45 tablet 3  . nitroGLYCERIN (NITROSTAT) 0.4 MG SL tablet Place 1 tablet (0.4 mg total) under the tongue every 5 (five) minutes as needed for chest pain. 25 tablet 3  . Omega-3 Fatty Acids (FISH OIL) 1000 MG CAPS Take 1,000 mg by mouth daily.     No current facility-administered  medications on file prior to visit.     Allergies  Allergen Reactions  . Codeine Nausea And Vomiting    Past Medical History:  Diagnosis Date  . Anemia   . Atrial fibrillation (HCC) 01/13/2012  . CAD (coronary artery disease)    s/p CABG 2001  . COPD (chronic obstructive pulmonary disease) (HCC)    ongoing tobacco use  . GERD (gastroesophageal reflux disease)   . Ischemic cardiomyopathy 01/10/2012   LVEF 35-40%, mild conc hypertrophy, servere HK of anteroseptum, borderline RVH  . NSTEMI (non-ST elevated myocardial infarction) (HCC) 01/10/2012   Associated with ventricular fibrillation    Past Surgical History:  Procedure Laterality Date  . CARDIAC CATHETERIZATION  2011   At [facility]  . CARDIAC CATHETERIZATION  12/2011   prox LAD occlusion, ostial RCA occlusion, 30% prox LCx stenosis, LIMA-LAD: 80% post-anastamosis lesion, SVG-Dx2: old occlusion w/ thrombus (noted on prior 2011 cath at [facility]), SVG-Dx: patent, SVG-RCA: patent, diffuse irregs, 80-90% PDA/PLA lesions patent  . CARDIAC CATHETERIZATION N/A 03/15/2015   Procedure: Left Heart Cath and Cors/Grafts Angiography;  Surgeon: Kathleene Hazel, MD;  Location: [facility]VASIVE CV LAB;  Service: Cardiovascular;  Laterality: N/A;  . CARDIAC CATHETERIZATION N/A 03/15/2015   Procedure: Coronary Stent Intervention;  Surgeon: Kathleene Hazel, MD;  Location: [facility] INVASIVE CV LAB;  Service: Cardiovascular;  Laterality: N/A;  svg to diagnoal 2  . CORONARY ARTERY BYPASS GRAFT  2001  . LEFT HEART CATHETERIZATION WITH CORONARY ANGIOGRAM N/A 01/10/2012  Procedure: LEFT HEART CATHETERIZATION WITH CORONARY ANGIOGRAM;  Surgeon: Lennette Bihari, MD;  Location: [facility] CATH LAB;  Service: Cardiovascular;  Laterality: N/A;    Family History  Problem Relation Age of Onset  . Hyperlipidemia Mother   . Alcohol abuse Father   . Hyperlipidemia Father   . Heart disease Sister   . Hyperlipidemia Sister   . Heart disease Brother   . Hyperlipidemia  Brother   . Hyperlipidemia Sister   . Diabetes Neg Hx   . Hypertension Neg Hx     Social History   Social History  . Marital status: Married    Spouse name: N/A  . Number of children: 4  . Years of education: N/A   Occupational History  . Management --now disabled    Social History Main Topics  . Smoking status: Former Smoker    Packs/day: 1.00    Years: 45.00    Types: Cigarettes    Quit date: 03/13/2015  . Smokeless tobacco: Never Used  . Alcohol use 0.0 oz/week     Comment: really rare beer  . Drug use: Yes    Types: Marijuana  . Sexual activity: Not Currently   Other Topics Concern  . Not on file   Social History Narrative   Married twice   3 children from 1st marriage-- 1 from second   Worked as Investment banker, operational      Has living will   Wife has health care POA   Would accept resuscitation--- but no prolonged ventilation   No tube feeds if cognitively unaware   Review of Systems Appetite is okay now Wears seat belt Full dentures Bowels are fine. No blood. Voids okay. Only sporadic nocturia Just some knee pain--no other joint issues No rash or suspicious skin lesions    Objective:   Physical Exam  Constitutional: He is oriented to person, place, and time. He appears well-developed and well-nourished. No distress.  HENT:  Mouth/Throat: Oropharynx is clear and moist. No oropharyngeal exudate.  Neck: No thyromegaly present.  Cardiovascular: Normal rate, regular rhythm, normal heart sounds and intact distal pulses.  Exam reveals no gallop.   No murmur heard. Pulmonary/Chest: Effort normal and breath sounds normal. No respiratory distress. He has no wheezes. He has no rales.  Abdominal: Soft. There is no tenderness.  Musculoskeletal: He exhibits no edema or tenderness.  Lymphadenopathy:    He has no cervical adenopathy.  Neurological: He is alert and oriented to person, place, and time.  President---- "Benson [place],  Bush" [phone] [id number] Recall 3/3  Skin: No rash noted. No erythema.  Psychiatric: He has a normal mood and affect. His behavior is normal.          Assessment & Plan:

## 2016-07-11 NOTE — Progress Notes (Signed)
Pre visit review using our clinic review tool, if applicable. No additional management support is needed unless otherwise documented below in the visit note. 

## 2016-07-11 NOTE — Patient Instructions (Signed)
Please try tylenol arthritis  at bedtime. You can also try melatonin (over the counter) 3-6 mg at bedtime also. You can use the clonazepam--but it is best if you don't use it every night.

## 2016-07-11 NOTE — Assessment & Plan Note (Signed)
After MI No recent spells apparent

## 2016-07-11 NOTE — Assessment & Plan Note (Signed)
No symptoms since stopping cigarettes

## 2016-07-11 NOTE — Addendum Note (Signed)
Addended by: Eual Fines on: 07/11/2016 03:16 PM   Modules accepted: Orders

## 2016-07-11 NOTE — Assessment & Plan Note (Signed)
I have personally reviewed the Medicare Annual Wellness questionnaire and have noted 1. The patient's medical and social history 2. Their use of alcohol, tobacco or illicit drugs 3. Their current medications and supplements 4. The patient's functional ability including ADL's, fall risks, home safety risks and hearing or visual             impairment. 5. Diet and physical activities 6. Evidence for depression or mood disorders  The patients weight, height, BMI and visual acuity have been recorded in the chart I have made referrals, counseling and provided education to the patient based review of the above and I have provided the pt with a written personalized care plan for preventive services.  I have provided you with a copy of your personalized plan for preventive services. Please take the time to review along with your updated medication list.  Will give prevnar Yearly flu vaccine Will consider shingrix Just did FIT--awaiting results Still prefers no PSA

## 2016-07-11 NOTE — Assessment & Plan Note (Signed)
Improved with his increased activity No recent claudication and stable ABI

## 2016-07-11 NOTE — Assessment & Plan Note (Signed)
Has been compensated since weight loss

## 2016-07-12 ENCOUNTER — Other Ambulatory Visit (INDEPENDENT_AMBULATORY_CARE_PROVIDER_SITE_OTHER): Payer: PPO

## 2016-07-12 DIAGNOSIS — K921 Melena: Secondary | ICD-10-CM

## 2016-07-12 LAB — FECAL OCCULT BLOOD, IMMUNOCHEMICAL: FECAL OCCULT BLD: NEGATIVE

## 2016-08-06 ENCOUNTER — Ambulatory Visit (INDEPENDENT_AMBULATORY_CARE_PROVIDER_SITE_OTHER): Payer: PPO | Admitting: Cardiovascular Disease

## 2016-08-06 ENCOUNTER — Encounter: Payer: Self-pay | Admitting: Cardiovascular Disease

## 2016-08-06 VITALS — BP 139/81 | HR 52 | Wt 157.8 lb

## 2016-08-06 DIAGNOSIS — I2510 Atherosclerotic heart disease of native coronary artery without angina pectoris: Secondary | ICD-10-CM | POA: Diagnosis not present

## 2016-08-06 DIAGNOSIS — I739 Peripheral vascular disease, unspecified: Secondary | ICD-10-CM

## 2016-08-06 DIAGNOSIS — E785 Hyperlipidemia, unspecified: Secondary | ICD-10-CM

## 2016-08-06 DIAGNOSIS — I10 Essential (primary) hypertension: Secondary | ICD-10-CM

## 2016-08-06 NOTE — Patient Instructions (Signed)

## 2016-08-06 NOTE — Progress Notes (Signed)
Cardiology Office Note   Date:  08/06/2016   ID:  Franklin PartyLarry Diggs, DOB [birth year], MRN [id number]  PCP:  Karie SchwalbeLetvak, Richard I, MD  Cardiologist:   Lorine BearsMuhammad Tyia Binford, MD   Chief Complaint  Patient presents with  . Follow-up    6months; Pt states no Sx.       History of Present Illness: Franklin Marks is a 66 y.o. male who presents for  a followup visit regarding peripheral arterial disease and coronary artery disease. He has known history of cardiac arrest occurring in the setting of coronary artery disease and chest pain. He is status post CABG in 2001. He has known history of peripheral arterial disease with mildly reduced ABI bilaterally and chronic total occlusion of the SFA on both sides with reconstitution distally via collaterals.  He had non-ST elevation myocardial infarction in December 2016. He underwent PCI and drug-eluting stent placement to the SVG to second diagonal. EF was 35-45%. Most recent ABI in March was in the 0.9 range.   He has been doing well with no chest pain or shortness of breath. He has no significant calf claudication. He does complain of some joint discomfort that improved with pain medication. He smokes a cigar very rarely.  Past Medical History:  Diagnosis Date  . Anemia   . Atrial fibrillation (HCC) 01/13/2012  . CAD (coronary artery disease)    s/p CABG 2001  . COPD (chronic obstructive pulmonary disease) (HCC)    ongoing tobacco use  . GERD (gastroesophageal reflux disease)   . Ischemic cardiomyopathy 01/10/2012   LVEF 35-40%, mild conc hypertrophy, servere HK of anteroseptum, borderline RVH  . NSTEMI (non-ST elevated myocardial infarction) (HCC) 01/10/2012   Associated with ventricular fibrillation    Past Surgical History:  Procedure Laterality Date  . [facility]  [facility]   [facility]  . CARDIAC CATHETERIZATION  12/2011   prox LAD occlusion, ostial RCA occlusion, 30% prox LCx stenosis, LIMA-LAD: 80% post-anastamosis lesion, SVG-Dx2: old  occlusion w/ thrombus (noted on prior [facility]), SVG-Dx: patent, SVG-RCA: patent, diffuse irregs, 80-90% PDA/PLA lesions patent  . CARDIAC CATHETERIZATION N/A 03/15/2015   Procedure: Left Heart Cath and Cors/Grafts Angiography;  Surgeon: Kathleene Hazelhristopher D McAlhany, MD;  [facility];  Service: Cardiovascular;  Laterality: N/A;  . CARDIAC CATHETERIZATION N/A 03/15/2015   Procedure: Coronary Stent Intervention;  Surgeon: Kathleene Hazelhristopher D McAlhany, MD;  Location: [facility] INVASIVE CV LAB;  Service: Cardiovascular;  Laterality: N/A;  svg to diagnoal 2  . CORONARY ARTERY BYPASS GRAFT  2001  . LEFT HEART CATHETERIZATION WITH CORONARY ANGIOGRAM N/A 01/10/2012   Procedure: LEFT HEART CATHETERIZATION WITH CORONARY ANGIOGRAM;  Surgeon: Lennette Biharihomas A Kelly, MD;  [facility];  Service: Cardiovascular;  Laterality: N/A;     Current Outpatient Prescriptions  Medication Sig Dispense Refill  . aspirin 81 MG chewable tablet Chew 1 tablet (81 mg total) by mouth daily.    [name]. atorvastatin (LIPITOR) 80 MG tablet Take 1 tablet (80 mg total) by mouth daily at 6 PM. 90 tablet 3  . carvedilol (COREG) 6.25 MG tablet Take 0.5 tablets (3.125 mg total) by mouth 2 (two) times daily with a meal. 90 tablet 1  . clonazePAM (KLONOPIN) 0.5 MG tablet Take 1 tablet (0.5 mg total) by mouth at bedtime as needed. 30 tablet 0  . clopidogrel (PLAVIX) 75 MG tablet Take 1 tablet (75 mg total) by mouth daily with breakfast. 90 tablet 3  . lisinopril (PRINIVIL,ZESTRIL) 5 MG tablet  TAKE 1/2 TABLET EVERY DAY 45 tablet 3  . nitroGLYCERIN (NITROSTAT) 0.4 MG SL tablet Place 1 tablet (0.4 mg total) under the tongue every 5 (five) minutes as needed for chest pain. 25 tablet 0  . Omega-3 Fatty Acids (FISH OIL) 1000 MG CAPS Take 1,000 mg by mouth daily.     No current facility-administered medications for this visit.     Allergies:   Codeine    Social History:  The patient  reports that he quit smoking about 16 months ago.  His smoking use included Cigarettes. He has a 45.00 pack-year smoking history. He has never used smokeless tobacco. He reports that he drinks alcohol. He reports that he uses drugs, including Marijuana.   Family History:  The patient's family history includes Alcohol abuse in his father; Heart disease in his brother and sister; Hyperlipidemia in his brother, father, mother, sister, and sister.    ROS:  Please see the history of present illness.   Otherwise, review of systems are positive for none.   All other systems are reviewed and negative.    PHYSICAL EXAM: VS:  BP 139/81   Pulse (!) 52   Wt 157 lb 12.8 oz (71.6 kg)   BMI 24.35 kg/m  , BMI Body mass index is 24.35 kg/m. GEN: Well nourished, well developed, in no acute distress  HEENT: normal  Neck: no JVD, carotid bruits, or masses Cardiac: RRR; no murmurs, rubs, or gallops,no edema  Respiratory:  clear to auscultation bilaterally, normal work of breathing GI: soft, nontender, nondistended, + BS MS: no deformity or atrophy  Skin: warm and dry, no rash Neuro:  Strength and sensation are intact Psych: euthymic mood, full affect Vascular: Femoral pulses are normal. Distal pulses are not palpable.  EKG:  EKG is ordered today. EKG showed sinus bradycardia with first-degree AV block with right bundle branch block.    Recent Labs: 07/11/2016: ALT 12; BUN 14; Creatinine, Ser 0.85; Hemoglobin 13.7; Platelets 179.0; Potassium 4.4; Sodium 137    Lipid Panel    Component Value Date/Time   CHOL 84 07/11/2016 1502   TRIG 148.0 07/11/2016 1502   HDL 30.60 (L) 07/11/2016 1502   CHOLHDL 3 07/11/2016 1502   VLDL 29.6 07/11/2016 1502   LDLCALC 24 07/11/2016 1502   LDLDIRECT 69.0 06/16/2014 1551      Wt Readings from Last 3 Encounters:  08/06/16 157 lb 12.8 oz (71.6 kg)  07/11/16 156 lb (70.8 kg)  02/06/16 158 lb 9.6 oz (71.9 kg)        ASSESSMENT AND PLAN:  1.  Peripheral arterial disease: Patient has known occluded SFA  bilaterally .He denies claudication at the present time. Continue medical therapy.  2. Coronary artery disease status post \ angioplasty and drug-eluting stent placement of SVG to second diagonal in December 2016.   Continue long-term dual antiplatelet therapy as tolerated.  3. Hyperlipidemia: Continue treatment with atorvastatin.  I reviewed recent labs from April which showed total cholesterol of 84, triglyceride of 148, HDL of 30 and an LDL of 24. Elevated triglycerides improved with fish oil.  4. Essential hypertension: Blood pressure is well controlled on current medications.   Disposition:   FU with me in 12 months    Signed,  Lorine Bears, MD  08/06/2016 10:25 AM    [facility]

## 2016-08-08 ENCOUNTER — Other Ambulatory Visit: Payer: Self-pay | Admitting: Internal Medicine

## 2016-08-13 ENCOUNTER — Encounter: Payer: Self-pay | Admitting: Family Medicine

## 2016-08-13 ENCOUNTER — Ambulatory Visit (INDEPENDENT_AMBULATORY_CARE_PROVIDER_SITE_OTHER): Payer: PPO | Admitting: Family Medicine

## 2016-08-13 VITALS — BP 110/68 | HR 64 | Temp 97.6°F | Ht 68.0 in | Wt 158.0 lb

## 2016-08-13 DIAGNOSIS — R319 Hematuria, unspecified: Secondary | ICD-10-CM | POA: Diagnosis not present

## 2016-08-13 DIAGNOSIS — N3001 Acute cystitis with hematuria: Secondary | ICD-10-CM | POA: Diagnosis not present

## 2016-08-13 DIAGNOSIS — M549 Dorsalgia, unspecified: Secondary | ICD-10-CM

## 2016-08-13 DIAGNOSIS — N390 Urinary tract infection, site not specified: Secondary | ICD-10-CM | POA: Insufficient documentation

## 2016-08-13 LAB — POC URINALSYSI DIPSTICK (AUTOMATED)
BILIRUBIN UA: NEGATIVE
Glucose, UA: NEGATIVE
KETONES UA: POSITIVE
Nitrite, UA: NEGATIVE
PH UA: 5.5 (ref 5.0–8.0)
Urobilinogen, UA: 1 E.U./dL

## 2016-08-13 MED ORDER — SULFAMETHOXAZOLE-TRIMETHOPRIM 800-160 MG PO TABS: 1.0000 | ORAL_TABLET | Freq: Two times a day (BID) | ORAL | 0 refills | Status: DC

## 2016-08-13 NOTE — Patient Instructions (Addendum)
Drink less soda and more water  Aim for 64 oz of fluid daily - mostly water  Take bactrim as directed  We will alert you when urine culture comes back  Follow up with Dr Alphonsus SiasLetvak about 2 weeks

## 2016-08-13 NOTE — Progress Notes (Signed)
Subjective:    Patient ID: Franklin Marks, male    DOB: Feb 28, 1951, 66 y.o.   MRN: [id number]  HPI Here for c/o of blood in urine and back pain  Got up at 4 am to urinate as usual Was blood red at the end of urination   Urine smells strong  No bladder pain  Some back pain - low and midline No nausea or vomiting or fever   Not a good water drinker Drinks more soda    Results for orders placed or performed in visit on 08/13/16  POCT Urinalysis Dipstick (Automated)  Result Value Ref Range   Color, UA brown    Clarity, UA cloudy    Glucose, UA negative    Bilirubin, UA negative    Ketones, UA positive    Spec Grav, UA >=1.030 (A) 1.010 - 1.025   Blood, UA 3+    pH, UA 5.5 5.0 - 8.0   Protein, UA 1+    Urobilinogen, UA 1.0 0.2 or 1.0 E.U./dL   Nitrite, UA negative    Leukocytes, UA Large (3+) (A) Negative    He takes asa and plavix   BP Readings from Last 3 Encounters:  08/13/16 110/68  08/06/16 139/81  07/11/16 116/70    Patient Active Problem List   Diagnosis Date Noted  . UTI (urinary tract infection) 08/13/2016  . Systolic CHF (HCC) 06/20/2015  . Osteoarthrosis involving multiple sites 06/20/2015  . Marijuana abuse 03/16/2015  . Prediabetes 03/16/2015  . Stented coronary artery   . Ischemic chest pain   . Tobacco abuse 03/14/2015  . Hyperlipidemia 04/11/2014  . Routine general medical examination at a health care facility 06/14/2013  . Restless legs syndrome 09/10/2012  . PAD (peripheral artery disease) (HCC) 04/28/2012  . Sinus bradycardia 02/27/2012  . Atrial fibrillation (HCC) 01/13/2012  . Hyperglycemia 01/12/2012  . CAD (coronary artery disease) 01/10/2012  . COPD (chronic obstructive pulmonary disease) (HCC) 01/10/2012  . Ischemic cardiomyopathy 01/10/2012   Past Medical History:  Diagnosis Date  . Anemia   . Atrial fibrillation (HCC) 01/13/2012  . CAD (coronary artery disease)    s/p CABG 2001  . COPD (chronic obstructive pulmonary disease)  (HCC)    ongoing tobacco use  . GERD (gastroesophageal reflux disease)   . Ischemic cardiomyopathy 01/10/2012   LVEF 35-40%, mild conc hypertrophy, servere HK of anteroseptum, borderline RVH  . NSTEMI (non-ST elevated myocardial infarction) (HCC) 01/10/2012   Associated with ventricular fibrillation   Past Surgical History:  Procedure Laterality Date  . CARDIAC CATHETERIZATION  2011   At [facility]  . CARDIAC CATHETERIZATION  12/2011   prox LAD occlusion, ostial RCA occlusion, 30% prox LCx stenosis, LIMA-LAD: 80% post-anastamosis lesion, SVG-Dx2: old occlusion w/ thrombus (noted on prior 2011 cath at [facility]), SVG-Dx: patent, SVG-RCA: patent, diffuse irregs, 80-90% PDA/PLA lesions patent  . CARDIAC CATHETERIZATION N/A 03/15/2015   Procedure: Left Heart Cath and Cors/Grafts Angiography;  Surgeon: Kathleene Hazel, MD;  Location: [facility] INVASIVE CV LAB;  Service: Cardiovascular;  Laterality: N/A;  . CARDIAC CATHETERIZATION N/A 03/15/2015   Procedure: Coronary Stent Intervention;  Surgeon: Kathleene Hazel, MD;  Location: [facility] INVASIVE CV LAB;  Service: Cardiovascular;  Laterality: N/A;  svg to diagnoal 2  . CORONARY ARTERY BYPASS GRAFT  2001  . LEFT HEART CATHETERIZATION WITH CORONARY ANGIOGRAM N/A 01/10/2012   Procedure: LEFT HEART CATHETERIZATION WITH CORONARY ANGIOGRAM;  Surgeon: Lennette Bihari, MD;  Location: [facility] CATH LAB;  Service: Cardiovascular;  Laterality: N/A;  Social History  Substance Use Topics  . Smoking status: Former Smoker    Packs/day: 1.00    Years: 45.00    Types: Cigarettes    Quit date: 03/13/2015  . Smokeless tobacco: Never Used  . Alcohol use 0.0 oz/week     Comment: really rare beer   Family History  Problem Relation Age of Onset  . Hyperlipidemia Mother   . Alcohol abuse Father   . Hyperlipidemia Father   . Heart disease Sister   . Hyperlipidemia Sister   . Heart disease Brother   . Hyperlipidemia Brother   . Hyperlipidemia Sister   . Diabetes Neg  Hx   . Hypertension Neg Hx    Allergies  Allergen Reactions  . Codeine Nausea And Vomiting   Current Outpatient Prescriptions on File Prior to Visit  Medication Sig Dispense Refill  . aspirin 81 MG chewable tablet Chew 1 tablet (81 mg total) by mouth daily.    [name] atorvastatin (LIPITOR) 80 MG tablet Take 1 tablet (80 mg total) by mouth daily at 6 PM. 90 tablet 3  . carvedilol (COREG) 6.25 MG tablet Take 0.5 tablets (3.125 mg total) by mouth 2 (two) times daily with a meal. 90 tablet 1  . clonazePAM (KLONOPIN) 0.5 MG tablet Take 1 tablet (0.5 mg total) by mouth at bedtime as needed. 30 tablet 0  . clopidogrel (PLAVIX) 75 MG tablet Take 1 tablet (75 mg total) by mouth daily with breakfast. 90 tablet 3  . lisinopril (PRINIVIL,ZESTRIL) 5 MG tablet TAKE 1/2 TABLET BY MOUTH ONCE DAILY 45 tablet 3  . nitroGLYCERIN (NITROSTAT) 0.4 MG SL tablet Place 1 tablet (0.4 mg total) under the tongue every 5 (five) minutes as needed for chest pain. 25 tablet 0  . Omega-3 Fatty Acids (FISH OIL) 1000 MG CAPS Take 1,000 mg by mouth daily.     No current facility-administered medications on file prior to visit.     Review of Systems  Constitutional: Negative for activity change, appetite change, fatigue, fever and unexpected weight change.  HENT: Negative for congestion and sore throat.   Eyes: Negative for pain, redness, itching and visual disturbance.  Respiratory: Negative for cough, chest tightness, shortness of breath and wheezing.   Cardiovascular: Negative for chest pain and palpitations.  Gastrointestinal: Negative for abdominal pain, blood in stool, constipation, diarrhea and nausea.  Endocrine: Negative for cold intolerance, heat intolerance, polydipsia and polyuria.  Genitourinary: Positive for frequency, hematuria and urgency. Negative for difficulty urinating, dysuria and flank pain.  Musculoskeletal: Positive for back pain. Negative for arthralgias, joint swelling and myalgias.  Skin: Negative for  pallor and rash.  Neurological: Negative for dizziness, tremors, weakness, numbness and headaches.  Hematological: Negative for adenopathy. Does not bruise/bleed easily.  Psychiatric/Behavioral: The patient is not nervous/anxious.        Objective:   Physical Exam  Constitutional: He appears well-developed and well-nourished. No distress.  Well appearing   HENT:  Head: Normocephalic and atraumatic.  Eyes: Conjunctivae and EOM are normal. Pupils are equal, round, and reactive to light.  Neck: Normal range of motion. Neck supple.  Cardiovascular: Normal rate, regular rhythm and normal heart sounds.   Pulmonary/Chest: Effort normal and breath sounds normal.  Abdominal: Soft. Bowel sounds are normal. He exhibits no distension. There is tenderness. There is no rebound.  No cva tenderness  Mild suprapubic tenderness  Musculoskeletal: He exhibits no edema.  Lymphadenopathy:    He has no cervical adenopathy.  Neurological: He is alert.  Skin:  No rash noted. No pallor.  Psychiatric: He has a normal mood and affect.          Assessment & Plan:   Problem List Items Addressed This Visit      Genitourinary   UTI (urinary tract infection) - Primary    With several episodes of gross hematuria and pos ua  Pending urine cx  tx with bactrim ds May have BPH and not empty fully  No s/s of renal stone  Disc high sg of urine-will inc water intake  F/u with PCP in about 2 wk for a re check  Update if not starting to improve in a week or if worsening        Relevant Medications   sulfamethoxazole-trimethoprim (BACTRIM DS,SEPTRA DS) 800-160 MG tablet   Other Relevant Orders   Urine culture    Other Visit Diagnoses    Hematuria, unspecified type       Relevant Orders   POCT Urinalysis Dipstick (Automated) (Completed)   Back pain, unspecified back location, unspecified back pain laterality, unspecified chronicity       Relevant Orders   POCT Urinalysis Dipstick (Automated)  (Completed)

## 2016-08-14 NOTE — Assessment & Plan Note (Signed)
With several episodes of gross hematuria and pos ua  Pending urine cx  tx with bactrim ds May have BPH and not empty fully  No s/s of renal stone  Disc high sg of urine-will inc water intake  F/u with PCP in about 2 wk for a re check  Update if not starting to improve in a week or if worsening

## 2016-08-15 LAB — URINE CULTURE: Organism ID, Bacteria: NO GROWTH

## 2016-08-27 ENCOUNTER — Ambulatory Visit (INDEPENDENT_AMBULATORY_CARE_PROVIDER_SITE_OTHER): Payer: PPO | Admitting: Internal Medicine

## 2016-08-27 ENCOUNTER — Encounter: Payer: Self-pay | Admitting: Internal Medicine

## 2016-08-27 VITALS — BP 110/80 | HR 48 | Temp 97.4°F | Wt 163.0 lb

## 2016-08-27 DIAGNOSIS — R319 Hematuria, unspecified: Secondary | ICD-10-CM | POA: Diagnosis not present

## 2016-08-27 LAB — URINALYSIS
BILIRUBIN URINE: NEGATIVE
HGB URINE DIPSTICK: NEGATIVE
Ketones, ur: NEGATIVE
Leukocytes, UA: NEGATIVE
Nitrite: NEGATIVE
PH: 6 (ref 5.0–8.0)
Specific Gravity, Urine: 1.01 (ref 1.000–1.030)
TOTAL PROTEIN, URINE-UPE24: NEGATIVE
URINE GLUCOSE: NEGATIVE
Urobilinogen, UA: 0.2 (ref 0.0–1.0)

## 2016-08-27 NOTE — Progress Notes (Signed)
Subjective:    Patient ID: Franklin Marks, male    DOB: 1950/05/03, 66 y.o.   MRN: [id number]  HPI Here for follow up of hematuria Now feels better Was having back pain---now better Some nighttime urgency as baseline---no worse with this episode  He took the antibiotics for a full course--7 days  Current Outpatient Prescriptions on File Prior to Visit  Medication Sig Dispense Refill  . aspirin 81 MG chewable tablet Chew 1 tablet (81 mg total) by mouth daily.    [name] atorvastatin (LIPITOR) 80 MG tablet Take 1 tablet (80 mg total) by mouth daily at 6 PM. 90 tablet 3  . carvedilol (COREG) 6.25 MG tablet Take 0.5 tablets (3.125 mg total) by mouth 2 (two) times daily with a meal. 90 tablet 1  . clonazePAM (KLONOPIN) 0.5 MG tablet Take 1 tablet (0.5 mg total) by mouth at bedtime as needed. 30 tablet 0  . clopidogrel (PLAVIX) 75 MG tablet Take 1 tablet (75 mg total) by mouth daily with breakfast. 90 tablet 3  . lisinopril (PRINIVIL,ZESTRIL) 5 MG tablet TAKE 1/2 TABLET BY MOUTH ONCE DAILY 45 tablet 3  . nitroGLYCERIN (NITROSTAT) 0.4 MG SL tablet Place 1 tablet (0.4 mg total) under the tongue every 5 (five) minutes as needed for chest pain. 25 tablet 0  . Omega-3 Fatty Acids (FISH OIL) 1000 MG CAPS Take 1,000 mg by mouth daily.     No current facility-administered medications on file prior to visit.     Allergies  Allergen Reactions  . Codeine Nausea And Vomiting    Past Medical History:  Diagnosis Date  . Anemia   . Atrial fibrillation (HCC) 01/13/2012  . CAD (coronary artery disease)    s/p CABG 2001  . COPD (chronic obstructive pulmonary disease) (HCC)    ongoing tobacco use  . GERD (gastroesophageal reflux disease)   . Ischemic cardiomyopathy 01/10/2012   LVEF 35-40%, mild conc hypertrophy, servere HK of anteroseptum, borderline RVH  . NSTEMI (non-ST elevated myocardial infarction) (HCC) 01/10/2012   Associated with ventricular fibrillation    Past Surgical History:  Procedure  Laterality Date  . CARDIAC CATHETERIZATION  2011   At [facility]  . CARDIAC CATHETERIZATION  12/2011   prox LAD occlusion, ostial RCA occlusion, 30% prox LCx stenosis, LIMA-LAD: 80% post-anastamosis lesion, SVG-Dx2: old occlusion w/ thrombus (noted on prior 2011 cath at [facility]), SVG-Dx: patent, SVG-RCA: patent, diffuse irregs, 80-90% PDA/PLA lesions patent  . CARDIAC CATHETERIZATION N/A 03/15/2015   Procedure: Left Heart Cath and Cors/Grafts Angiography;  Surgeon: Kathleene Hazel, MD;  Location: [facility] INVASIVE CV LAB;  Service: Cardiovascular;  Laterality: N/A;  . CARDIAC CATHETERIZATION N/A 03/15/2015   Procedure: Coronary Stent Intervention;  Surgeon: Kathleene Hazel, MD;  Location: [facility] INVASIVE CV LAB;  Service: Cardiovascular;  Laterality: N/A;  svg to diagnoal 2  . CORONARY ARTERY BYPASS GRAFT  2001  . LEFT HEART CATHETERIZATION WITH CORONARY ANGIOGRAM N/A 01/10/2012   Procedure: LEFT HEART CATHETERIZATION WITH CORONARY ANGIOGRAM;  Surgeon: Lennette Bihari, MD;  Location: [facility] CATH LAB;  Service: Cardiovascular;  Laterality: N/A;    Family History  Problem Relation Age of Onset  . Hyperlipidemia Mother   . Alcohol abuse Father   . Hyperlipidemia Father   . Heart disease Sister   . Hyperlipidemia Sister   . Heart disease Brother   . Hyperlipidemia Brother   . Hyperlipidemia Sister   . Diabetes Neg Hx   . Hypertension Neg Hx     Social History  Social History  . Marital status: Married    Spouse name: N/A  . Number of children: 4  . Years of education: N/A   Occupational History  . Management --now disabled    Social History Main Topics  . Smoking status: Former Smoker    Packs/day: 1.00    Years: 45.00    Types: Cigarettes    Quit date: 03/13/2015  . Smokeless tobacco: Never Used  . Alcohol use 0.0 oz/week     Comment: really rare beer  . Drug use: Yes    Types: Marijuana  . Sexual activity: Not Currently   Other Topics Concern  . 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 No distress.  Abdominal: Soft. He exhibits no distension. There is no tenderness. There is no rebound and no guarding.  Musculoskeletal:  No CVA tenderness          Assessment & Plan:

## 2016-08-27 NOTE — Assessment & Plan Note (Signed)
Gross hematuria resolved May have responded to the antibiotic--but the culture was negative Nothing to suggest he passed stone Long history of cigarette smoking---should consider bladder lesions Will recheck urinalysis If any blood (even micro), will proceed with urology evaluation

## 2016-10-01 ENCOUNTER — Other Ambulatory Visit: Payer: Self-pay | Admitting: Internal Medicine

## 2016-10-01 NOTE — Telephone Encounter (Signed)
Approved: 30 x 0 

## 2016-10-01 NOTE — Telephone Encounter (Signed)
Last filled 07-11-16 #30 Last CPE 07-11-16 Next CPE 07-14-17

## 2016-10-01 NOTE — Telephone Encounter (Signed)
Left refill on voice mail at pharmacy  

## 2016-10-31 ENCOUNTER — Other Ambulatory Visit: Payer: Self-pay | Admitting: Cardiovascular Disease

## 2016-12-31 ENCOUNTER — Other Ambulatory Visit: Payer: Self-pay | Admitting: Cardiovascular Disease

## 2017-01-17 ENCOUNTER — Other Ambulatory Visit: Payer: Self-pay | Admitting: Internal Medicine

## 2017-01-18 ENCOUNTER — Other Ambulatory Visit: Payer: Self-pay | Admitting: Internal Medicine

## 2017-01-20 NOTE — Telephone Encounter (Signed)
Approved: 30 x 0 

## 2017-01-20 NOTE — Telephone Encounter (Signed)
Last filled 10-01-16 #30 Last OV 08-27-16 Acute Next OV 07-14-17

## 2017-01-20 NOTE — Telephone Encounter (Signed)
Tried to call [facility]. Unable to get through. Message says all circuits are busy.

## 2017-01-20 NOTE — Telephone Encounter (Signed)
Left refill on voice mail at pharmacy  

## 2017-02-04 ENCOUNTER — Other Ambulatory Visit: Payer: Self-pay | Admitting: Cardiovascular Disease

## 2017-04-02 ENCOUNTER — Other Ambulatory Visit: Payer: Self-pay | Admitting: Internal Medicine

## 2017-04-02 NOTE — Telephone Encounter (Signed)
Last filled 01-20-17 #30 Last OV  Acute 08-27-16 Next OV 07-14-17

## 2017-06-10 ENCOUNTER — Telehealth: Payer: Self-pay | Admitting: Cardiovascular Disease

## 2017-06-10 NOTE — Telephone Encounter (Signed)
Per Dr. Kirke CorinArida, patient needs a one-year follow up appointment ([facility] office) to evaluate symptoms before ordering ABI/LEA.  Routed to Support Pool to contact patient.

## 2017-06-11 ENCOUNTER — Ambulatory Visit (HOSPITAL_COMMUNITY): Admission: RE | Admit: 2017-06-11 | Payer: PPO | Source: Ambulatory Visit

## 2017-07-09 ENCOUNTER — Other Ambulatory Visit: Payer: Self-pay | Admitting: Internal Medicine

## 2017-07-14 ENCOUNTER — Encounter: Payer: Self-pay | Admitting: Internal Medicine

## 2017-07-14 ENCOUNTER — Ambulatory Visit (INDEPENDENT_AMBULATORY_CARE_PROVIDER_SITE_OTHER): Payer: PPO | Admitting: Internal Medicine

## 2017-07-14 VITALS — BP 128/80 | HR 55 | Temp 97.5°F | Ht 67.75 in | Wt 173.0 lb

## 2017-07-14 DIAGNOSIS — I480 Paroxysmal atrial fibrillation: Secondary | ICD-10-CM | POA: Diagnosis not present

## 2017-07-14 DIAGNOSIS — J439 Emphysema, unspecified: Secondary | ICD-10-CM | POA: Diagnosis not present

## 2017-07-14 DIAGNOSIS — I739 Peripheral vascular disease, unspecified: Secondary | ICD-10-CM | POA: Diagnosis not present

## 2017-07-14 DIAGNOSIS — Z0000 Encounter for general adult medical examination without abnormal findings: Secondary | ICD-10-CM

## 2017-07-14 DIAGNOSIS — I5022 Chronic systolic (congestive) heart failure: Secondary | ICD-10-CM | POA: Diagnosis not present

## 2017-07-14 DIAGNOSIS — G2581 Restless legs syndrome: Secondary | ICD-10-CM

## 2017-07-14 DIAGNOSIS — Z7189 Other specified counseling: Secondary | ICD-10-CM | POA: Diagnosis not present

## 2017-07-14 DIAGNOSIS — Z1211 Encounter for screening for malignant neoplasm of colon: Secondary | ICD-10-CM

## 2017-07-14 MED ORDER — CLONAZEPAM 0.5 MG PO TABS: 0.5000 mg | ORAL_TABLET | Freq: Every evening | ORAL | 0 refills | Status: DC | PRN

## 2017-07-14 NOTE — Progress Notes (Signed)
Hearing Screening   Method: Audiometry             Right ear:   Left ear:   40 Visual Acuity Screening   Right eye Left eye Both eyes  Without correction:  With correction:

## 2017-07-14 NOTE — Assessment & Plan Note (Signed)
Briefly in past No evidence of recurrence

## 2017-07-14 NOTE — Assessment & Plan Note (Signed)
Decreased circulation but no claudication since stopping smoking No action needed right now

## 2017-07-14 NOTE — Assessment & Plan Note (Signed)
Compensated now On appropriate medications

## 2017-07-14 NOTE — Progress Notes (Signed)
Subjective:    Patient ID: Franklin Marks, male    DOB: Jul 30, 1950, 67 y.o.   MRN: [id number]  HPI Here for Medicare wellness visit and follow up of chronic health conditions Reviewed form and advanced directives Reviewed other doctors Walks regularly. Didn't really like the gym (asked him to restart) No tobacco Rare alcohol Vision and hearing are fine No falls No depression or anhedonia Independent with instrumental ADLs Memory seems fine  Gained back some of the weight he lost last year Now considers himself at a good weight Appetite is good  No further blood in urine Voids okay Increased nocturia--- twice now. Then with trouble getting back to sleep Melatonin helps him initiate but not keep him asleep (?5-10mg ) Plans to restart clonazepam prn  No recent cough or wheezing Breathing is fairly good  No chest pain No palpitations No edema No dizziness or syncope Exercise tolerance is stable No leg pain with activity lately  Current Outpatient Medications on File Prior to Visit  Medication Sig Dispense Refill  . aspirin 81 MG chewable tablet Chew 1 tablet (81 mg total) by mouth daily.    [name] atorvastatin (LIPITOR) 80 MG tablet Take 1 tablet (80 mg total) by mouth daily at 6 PM. 90 tablet 3  . carvedilol (COREG) 6.25 MG tablet TAKE ONE-HALF (1/2) OF A TABLET BY MOUTH TWICE DAILY WITH A MEAL. 90 tablet 3  . clonazePAM (KLONOPIN) 0.5 MG tablet TAKE 1 TABLET BY MOUTH EVERY NIGHT AT BEDTIME AS NEEDED. 30 tablet 0  . clopidogrel (PLAVIX) 75 MG tablet Take 1 tablet (75 mg total) by mouth daily with breakfast. 90 tablet 3  . lisinopril (PRINIVIL,ZESTRIL) 5 MG tablet TAKE 1/2 TABLET BY MOUTH ONCE DAILY 45 tablet 3  . Omega-3 Fatty Acids (FISH OIL) 1000 MG CAPS Take 1,000 mg by mouth daily.     No current facility-administered medications on file prior to visit.     Allergies  Allergen Reactions  . Codeine Nausea And Vomiting    Past Medical History:  Diagnosis Date  .  Anemia   . Atrial fibrillation (HCC) 01/13/2012  . CAD (coronary artery disease)    s/p CABG 2001  . COPD (chronic obstructive pulmonary disease) (HCC)    ongoing tobacco use  . GERD (gastroesophageal reflux disease)   . Ischemic cardiomyopathy 01/10/2012   LVEF 35-40%, mild conc hypertrophy, servere HK of anteroseptum, borderline RVH  . NSTEMI (non-ST elevated myocardial infarction) (HCC) 01/10/2012   Associated with ventricular fibrillation    Past Surgical History:  Procedure Laterality Date  . CARDIAC CATHETERIZATION  2011   At [facility]  . CARDIAC CATHETERIZATION  12/2011   prox LAD occlusion, ostial RCA occlusion, 30% prox LCx stenosis, LIMA-LAD: 80% post-anastamosis lesion, SVG-Dx2: old occlusion w/ thrombus (noted on prior 2011 cath at [facility]), SVG-Dx: patent, SVG-RCA: patent, diffuse irregs, 80-90% PDA/PLA lesions patent  . CARDIAC CATHETERIZATION N/A 03/15/2015   Procedure: Left Heart Cath and Cors/Grafts Angiography;  Surgeon: Kathleene Hazel, MD;  Location: [facility] INVASIVE CV LAB;  Service: Cardiovascular;  Laterality: N/A;  . CARDIAC CATHETERIZATION N/A 03/15/2015   Procedure: Coronary Stent Intervention;  Surgeon: Kathleene Hazel, MD;  Location: [facility] INVASIVE CV LAB;  Service: Cardiovascular;  Laterality: N/A;  svg to diagnoal 2  . CORONARY ARTERY BYPASS GRAFT  2001  . LEFT HEART CATHETERIZATION WITH CORONARY ANGIOGRAM N/A 01/10/2012   Procedure: LEFT HEART CATHETERIZATION WITH CORONARY ANGIOGRAM;  Surgeon: Lennette Bihari, MD;  Location: [facility] CATH LAB;  Service: Cardiovascular;  Laterality: N/A;    Family History  Problem Relation Age of Onset  . Hyperlipidemia Mother   . Alcohol abuse Father   . Hyperlipidemia Father   . Heart disease Sister   . Hyperlipidemia Sister   . Heart disease Brother   . Hyperlipidemia Brother   . Hyperlipidemia Sister   . Diabetes Neg Hx   . Hypertension Neg Hx     Social History   Socioeconomic History  . Marital status:  Married    Spouse name: Not on file  . Number of children: 4  . Years of education: Not on file  . Highest education level: Not on file  Occupational History  . Occupation: Management --now disabled  Social Needs  . Financial resource strain: Not on file  . Food insecurity:    Worry: Not on file    Inability: Not on file  . Transportation needs:    Medical: Not on file    Non-medical: Not on file  Tobacco Use  . Smoking status: Former Smoker    Packs/day: 1.00    Years: 45.00    Pack years: 45.00    Types: Cigarettes    Last attempt to quit: 03/13/2015    Years since quitting: 2.3  . Smokeless tobacco: Never Used  Substance and Sexual Activity  . Alcohol use: Yes    Alcohol/week: 0.0 oz    Comment: really rare beer  . Drug use: Yes    Types: Marijuana  . Sexual activity: Not Currently  Lifestyle  . Physical activity:    Days per week: Not on file    Minutes per session: Not on file  . Stress: Not on file  Relationships  . Social connections:    Talks on phone: Not on file    Gets together: Not on file    Attends religious service: Not on file    Active member of club or organization: Not on file    Attends meetings of clubs or organizations: Not on file    Relationship status: Not on file  . Intimate partner violence:    Fear of current or ex partner: Not on file    Emotionally abused: Not on file    Physically abused: Not on file    Forced sexual activity: Not on file  Other Topics Concern  . Not on file  Social History Narrative   Married twice   3 children from 1st marriage-- 1 from second   Worked as Investment banker, operational      Has living will   Wife has health care POA   Would accept resuscitation--- but no prolonged ventilation   No tube feeds if cognitively unaware   Review of Systems Some noise in knees---discussed tylenol at bedtime Wears seat belt Teeth okay---full dentures Bowels are fine--- no blood Urine flow is okay No rash. Does have  slowly enlarging spot on left chest (seborrheic keratosis so reassured) No other joint pain Some heartburn-- uses OTC meds prn. No dysphagia    Objective:   Physical Exam  Constitutional: He is oriented to person, place, and time. He appears well-developed. No distress.  HENT:  Mouth/Throat: Oropharynx is clear and moist. No oropharyngeal exudate.  Neck: No thyromegaly present.  Cardiovascular: Regular rhythm. Exam reveals no friction rub.  Mild bradycardia ?very slight systolic murmur at base Only very faint left DP pulse--no others palpable  Pulmonary/Chest: Effort normal. No stridor. No respiratory distress. He has no wheezes. He has no  rales.  Abdominal: Soft. He exhibits no distension and no mass. There is no tenderness. There is no guarding.  Musculoskeletal: He exhibits no edema or tenderness.  Lymphadenopathy:    He has no cervical adenopathy.  Neurological: He is alert and oriented to person, place, and time.  President--- "Benson [place], [place]" [phone] [id number] Recall 3/3  Skin: Skin is warm. No rash noted.  Psychiatric: He has a normal mood and affect. His behavior is normal.          Assessment & Plan:

## 2017-07-14 NOTE — Assessment & Plan Note (Signed)
Sleep problems don't seem to be RLS now He will try the clonazepam though again

## 2017-07-14 NOTE — Assessment & Plan Note (Signed)
Social history

## 2017-07-14 NOTE — Assessment & Plan Note (Addendum)
I have personally reviewed the Medicare Annual Wellness questionnaire and have noted 1. The patient's medical and social history 2. Their use of alcohol, tobacco or illicit drugs 3. Their current medications and supplements 4. The patient's functional ability including ADL's, fall risks, home safety risks and hearing or visual             impairment. 5. Diet and physical activities 6. Evidence for depression or mood disorders  The patients weight, height, BMI and visual acuity have been recorded in the chart I have made referrals, counseling and provided education to the patient based review of the above and I have provided the pt with a written personalized care plan for preventive services.  I have provided you with a copy of your personalized plan for preventive services. Please take the time to review along with your updated medication list.  Yearly flu vaccine Discussed PSA---he wishes to defer Discussed shingrix Exercises regularly FIT again

## 2017-07-14 NOTE — Assessment & Plan Note (Signed)
Only occasional cough since stopping smoking Doesn't need meds

## 2017-07-15 LAB — CBC
HCT: 40.7 % (ref 39.0–52.0)
Hemoglobin: 14.3 g/dL (ref 13.0–17.0)
MCHC: 35 g/dL (ref 30.0–36.0)
MCV: 91.7 fl (ref 78.0–100.0)
Platelets: 174 10*3/uL (ref 150.0–400.0)
RBC: 4.44 Mil/uL (ref 4.22–5.81)
RDW: 13 % (ref 11.5–15.5)
WBC: 8.4 10*3/uL (ref 4.0–10.5)

## 2017-07-15 LAB — COMPREHENSIVE METABOLIC PANEL
ALT: 17 U/L (ref 0–53)
AST: 13 U/L (ref 0–37)
Albumin: 4.1 g/dL (ref 3.5–5.2)
Alkaline Phosphatase: 94 U/L (ref 39–117)
BUN: 19 mg/dL (ref 6–23)
CHLORIDE: 104 meq/L (ref 96–112)
CO2: 27 meq/L (ref 19–32)
Calcium: 9.2 mg/dL (ref 8.4–10.5)
Creatinine, Ser: 0.88 mg/dL (ref 0.40–1.50)
GFR: 91.96 mL/min (ref >60.00)
Glucose, Bld: 89 mg/dL (ref 70–99)
POTASSIUM: 4.4 meq/L (ref 3.5–5.1)
Sodium: 138 mEq/L (ref 135–145)
Total Bilirubin: 0.7 mg/dL (ref 0.2–1.2)
Total Protein: 7 g/dL (ref 6.0–8.3)

## 2017-07-15 LAB — LIPID PANEL
CHOL/HDL RATIO: 4
Cholesterol: 116 mg/dL (ref 0–200)
HDL: 27.9 mg/dL — AB (ref >39.00)
NONHDL: 88.47
Triglycerides: 251 mg/dL — ABNORMAL HIGH (ref 0.0–149.0)
VLDL: 50.2 mg/dL — ABNORMAL HIGH (ref 0.0–40.0)

## 2017-07-15 LAB — LDL CHOLESTEROL, DIRECT: Direct LDL: 55 mg/dL

## 2017-08-05 ENCOUNTER — Other Ambulatory Visit: Payer: Self-pay | Admitting: Internal Medicine

## 2017-08-19 ENCOUNTER — Ambulatory Visit (INDEPENDENT_AMBULATORY_CARE_PROVIDER_SITE_OTHER): Payer: PPO | Admitting: Cardiovascular Disease

## 2017-08-19 ENCOUNTER — Encounter: Payer: Self-pay | Admitting: Cardiovascular Disease

## 2017-08-19 VITALS — BP 112/70 | HR 44 | Ht 69.0 in | Wt 175.0 lb

## 2017-08-19 DIAGNOSIS — I2510 Atherosclerotic heart disease of native coronary artery without angina pectoris: Secondary | ICD-10-CM

## 2017-08-19 DIAGNOSIS — E785 Hyperlipidemia, unspecified: Secondary | ICD-10-CM | POA: Diagnosis not present

## 2017-08-19 DIAGNOSIS — I10 Essential (primary) hypertension: Secondary | ICD-10-CM | POA: Diagnosis not present

## 2017-08-19 DIAGNOSIS — I5043 Acute on chronic combined systolic (congestive) and diastolic (congestive) heart failure: Secondary | ICD-10-CM

## 2017-08-19 DIAGNOSIS — I739 Peripheral vascular disease, unspecified: Secondary | ICD-10-CM | POA: Diagnosis not present

## 2017-08-19 MED ORDER — FISH OIL 1000 MG PO CAPS: 2000.0000 mg | ORAL_CAPSULE | Freq: Every day | ORAL | 11 refills | Status: DC

## 2017-08-19 NOTE — Progress Notes (Signed)
Cardiology Office Note   Date:  08/19/2017   ID:  Franklin Marks, DOB [birth year], MRN [id number]  PCP:  [name], MD  Cardiologist:   Lorine Bears, MD   No chief complaint on file.     History of Present Illness: Franklin Marks is a 67 y.o. male who presents for a followup visit regarding peripheral arterial disease,coronary artery disease and chronic systolic heart failure due to ischemic cardiomyopathy. He has known history of cardiac arrest occurring in the setting of coronary artery disease and chest pain. He is status post CABG in 2001. He has known history of peripheral arterial disease with mildly reduced ABI bilaterally and chronic total occlusion of the SFA on both sides with reconstitution distally via collaterals.  He had non-ST elevation myocardial infarction in December 2016. He underwent PCI and drug-eluting stent placement to the SVG to second diagonal. EF was 35-45%.  He quit smoking cigars since last visit.  He has been doing well with no recent chest pain, shortness of breath, dizziness, syncope or presyncope.  He has chronic bradycardia but he has been asymptomatic. He reports no leg claudication.  ABI in March 2018 was close to normal bilaterally.  Past Medical History:  Diagnosis Date  . Anemia   . Atrial fibrillation (HCC) 01/13/2012  . CAD (coronary artery disease)    s/p CABG 2001  . COPD (chronic obstructive pulmonary disease) (HCC)    ongoing tobacco use  . GERD (gastroesophageal reflux disease)   . Ischemic cardiomyopathy 01/10/2012   LVEF 35-40%, mild conc hypertrophy, servere HK of anteroseptum, borderline RVH  . NSTEMI (non-ST elevated myocardial infarction) (HCC) 01/10/2012   Associated with ventricular fibrillation    Past Surgical History:  Procedure Laterality Date  . CARDIAC CATHETERIZATION  2011   At [facility]  . CARDIAC CATHETERIZATION  12/2011   prox LAD occlusion, ostial RCA occlusion, 30% prox LCx stenosis, LIMA-LAD: 80%  post-anastamosis lesion, SVG-Dx2: old occlusion w/ thrombus (noted on prior 2011 cath at [facility]), SVG-Dx: patent, SVG-RCA: patent, diffuse irregs, 80-90% PDA/PLA lesions patent  . CARDIAC CATHETERIZATION N/A 03/15/2015   Procedure: Left Heart Cath and Cors/Grafts Angiography;  Surgeon: Kathleene Hazel, MD;  Location: [facility] INVASIVE CV LAB;  Service: Cardiovascular;  Laterality: N/A;  . CARDIAC CATHETERIZATION N/A 03/15/2015   Procedure: Coronary Stent Intervention;  Surgeon: Kathleene Hazel, MD;  Location: [facility] INVASIVE CV LAB;  Service: Cardiovascular;  Laterality: N/A;  svg to diagnoal 2  . CORONARY ARTERY BYPASS GRAFT  2001  . LEFT HEART CATHETERIZATION WITH CORONARY ANGIOGRAM N/A 01/10/2012   Procedure: LEFT HEART CATHETERIZATION WITH CORONARY ANGIOGRAM;  Surgeon: Lennette Bihari, MD;  Location: [facility] CATH LAB;  Service: Cardiovascular;  Laterality: N/A;     Current Outpatient Medications  Medication Sig Dispense Refill  . aspirin 81 MG chewable tablet Chew 1 tablet (81 mg total) by mouth daily.    [name] atorvastatin (LIPITOR) 80 MG tablet Take 1 tablet (80 mg total) by mouth daily at 6 PM. 90 tablet 3  . carvedilol (COREG) 6.25 MG tablet TAKE ONE-HALF (1/2) OF A TABLET BY MOUTH TWICE DAILY WITH A MEAL. 90 tablet 3  . clonazePAM (KLONOPIN) 0.5 MG tablet Take 1 tablet (0.5 mg total) by mouth at bedtime as needed. 30 tablet 0  . clopidogrel (PLAVIX) 75 MG tablet Take 1 tablet (75 mg total) by mouth daily with breakfast. 90 tablet 3  . lisinopril (PRINIVIL,ZESTRIL) 5 MG tablet TAKE 1/2 TABLET BY MOUTH EVERY  DAY. 45 tablet 3  . Omega-3 Fatty Acids (FISH OIL) 1000 MG CAPS Take 1,000 mg by mouth daily.     No current facility-administered medications for this visit.     Allergies:   Codeine    Social History:  The patient  reports that he quit smoking about 2 years ago. His smoking use included cigarettes. He has a 45.00 pack-year smoking history. He has never used smokeless tobacco. He  reports that he drinks alcohol. He reports that he has current or past drug history. Drug: Marijuana.   Family History:  The patient's family history includes Alcohol abuse in his father; Heart disease in his brother and sister; Hyperlipidemia in his brother, father, mother, sister, and sister.    ROS:  Please see the history of present illness.   Otherwise, review of systems are positive for none.   All other systems are reviewed and negative.    PHYSICAL EXAM: VS:  BP 112/70   Pulse (!) 44   Ht 5\' 9"  (1.753 m)   Wt 175 lb (79.4 kg)   BMI 25.84 kg/m  , BMI Body mass index is 25.84 kg/m. GEN: Well nourished, well developed, in no acute distress  HEENT: normal  Neck: no JVD, carotid bruits, or masses Cardiac: RRR; no murmurs, rubs, or gallops,no edema  Respiratory:  clear to auscultation bilaterally, normal work of breathing GI: soft, nontender, nondistended, + BS MS: no deformity or atrophy  Skin: warm and dry, no rash Neuro:  Strength and sensation are intact Psych: euthymic mood, full affect Vascular: Femoral pulses are normal. Distal pulses are not palpable.  EKG:  EKG is ordered today. EKG showed sinus bradycardia with sinus arrhythmia, first-degree AV block with right bundle branch block.    Recent Labs: 07/14/2017: ALT 17; BUN 19; Creatinine, Ser 0.88; Hemoglobin 14.3; Platelets 174.0; Potassium 4.4; Sodium 138    Lipid Panel    Component Value Date/Time   CHOL 116 07/14/2017 1552   TRIG 251.0 (H) 07/14/2017 1552   HDL 27.90 (L) 07/14/2017 1552   CHOLHDL 4 07/14/2017 1552   VLDL 50.2 (H) 07/14/2017 1552   LDLCALC 24 07/11/2016 1502   LDLDIRECT 55.0 07/14/2017 1552      Wt Readings from Last 3 Encounters:  08/19/17 175 lb (79.4 kg)  07/14/17 173 lb (78.5 kg)  08/27/16 163 lb (73.9 kg)        ASSESSMENT AND PLAN:  1.  Peripheral arterial disease: Patient has known occluded SFA bilaterally. He denies claudication at the present time. Continue medical  therapy.  2. Coronary artery disease status post \ angioplasty and drug-eluting stent placement of SVG to second diagonal in December 2016.   Continue long-term dual antiplatelet therapy as tolerated.  3. Hyperlipidemia: Continue treatment with atorvastatin.   Most recent lipid profile showed an LDL of 55.  However, his triglyceride increased again to 250.  I increased his fish oil to 2000 mg daily and discussed with him the importance of healthy diet.  4. Essential hypertension: Blood pressure is well controlled on current medications.  He has chronic bradycardia but he does not appear to be symptomatic.  I elected to keep him on small dose carvedilol.  5.  Chronic systolic heart failure due to mild ischemic cardiomyopathy: No recent EF evaluation.  I requested an echocardiogram.   Disposition:   FU with me in 12 months    Signed,  Lorine BearsMuhammad Annais Crafts, MD  08/19/2017 9:21 AM    [facility]

## 2017-08-19 NOTE — Patient Instructions (Signed)
Medication Instructions: Increase: Fish Oil 2000 mg daily  If you need a refill on your cardiac medications before your next appointment, please call your pharmacy.   Procedure: Your physician has requested that you have an echocardiogram. Echocardiography is a painless test that uses sound waves to create images of your heart. It provides your doctor with information about the size and shape of your heart and how well your heart's chambers and valves are working. This procedure takes approximately one hour. There are no restrictions for this procedure. [facility] [address]  Follow-Up: Your physician wants you to follow-up in 1 year You will receive a reminder letter in the mail two months in advance. If you don't receive a letter, please call our office at [phone] to schedule this follow-up appointment.   Special Instructions:    Thank you for choosing [facility] at [facility]!!

## 2017-08-20 ENCOUNTER — Other Ambulatory Visit: Payer: Self-pay | Admitting: Cardiovascular Disease

## 2017-08-20 NOTE — Telephone Encounter (Signed)
[facility] patient. 

## 2017-08-20 NOTE — Telephone Encounter (Signed)
Please see note from pharmacist on Omega 3 increased dose.

## 2017-08-21 NOTE — Telephone Encounter (Signed)
Please review for refill, Thanks !  

## 2017-08-21 NOTE — Addendum Note (Signed)
Addended by: Neta EhlersRUITT, Kahlil Cowans M on: 08/21/2017 01:59 PM   Modules accepted: Orders

## 2017-08-22 ENCOUNTER — Other Ambulatory Visit: Payer: Self-pay

## 2017-08-22 ENCOUNTER — Ambulatory Visit (HOSPITAL_COMMUNITY): Payer: PPO | Attending: Internal Medicine

## 2017-08-22 DIAGNOSIS — I5043 Acute on chronic combined systolic (congestive) and diastolic (congestive) heart failure: Secondary | ICD-10-CM | POA: Insufficient documentation

## 2017-08-22 DIAGNOSIS — J449 Chronic obstructive pulmonary disease, unspecified: Secondary | ICD-10-CM | POA: Insufficient documentation

## 2017-08-22 DIAGNOSIS — E785 Hyperlipidemia, unspecified: Secondary | ICD-10-CM | POA: Insufficient documentation

## 2017-08-22 DIAGNOSIS — I361 Nonrheumatic tricuspid (valve) insufficiency: Secondary | ICD-10-CM | POA: Diagnosis not present

## 2017-08-22 DIAGNOSIS — I4891 Unspecified atrial fibrillation: Secondary | ICD-10-CM | POA: Diagnosis not present

## 2017-08-22 DIAGNOSIS — I2510 Atherosclerotic heart disease of native coronary artery without angina pectoris: Secondary | ICD-10-CM | POA: Diagnosis not present

## 2017-08-27 ENCOUNTER — Ambulatory Visit (INDEPENDENT_AMBULATORY_CARE_PROVIDER_SITE_OTHER): Payer: PPO | Admitting: *Deleted

## 2017-08-27 ENCOUNTER — Ambulatory Visit: Payer: PPO

## 2017-08-27 DIAGNOSIS — Z23 Encounter for immunization: Secondary | ICD-10-CM | POA: Diagnosis not present

## 2017-10-01 ENCOUNTER — Other Ambulatory Visit: Payer: Self-pay | Admitting: Cardiovascular Disease

## 2017-10-01 NOTE — Telephone Encounter (Signed)
Please review for refill, Thanks !  

## 2017-10-21 ENCOUNTER — Ambulatory Visit: Payer: Self-pay | Admitting: Internal Medicine

## 2017-10-21 DIAGNOSIS — N3001 Acute cystitis with hematuria: Secondary | ICD-10-CM | POA: Diagnosis not present

## 2017-10-21 DIAGNOSIS — R319 Hematuria, unspecified: Secondary | ICD-10-CM | POA: Diagnosis not present

## 2017-10-21 DIAGNOSIS — Z7901 Long term (current) use of anticoagulants: Secondary | ICD-10-CM | POA: Diagnosis not present

## 2017-10-21 NOTE — Telephone Encounter (Signed)
Called in c/o having blood in his urine and passing clots since yesterday.   Also c/o bilateral flank "soreness".    See triage notes.    I [facility].  He was agreeable to this plan.   I let him know he should go within the next 4 hours per the protocol.   He assured me he would go.    Reason for Disposition . Taking Coumadin (warfarin) or other strong blood thinner, or known bleeding disorder (e.g., thrombocytopenia)  Answer Assessment - Initial Assessment Questions 1. COLOR of URINE: "Describe the color of the urine."  (e.g., tea-colored, pink, red, blood clots, bloody)     I'm on blood thinners.   I'm having blood in my urine with clots.    I had this happen last year and it was a bacterial infection. 2. ONSET: "When did the bleeding start?"      Sunday morning.   Yesterday the clots started.    I'm drinking a lot of water. 3. EPISODES: "How many times has there been blood in the urine?" or "How many times today?"     Last couple of times I've had clots in my urine. 4. PAIN with URINATION: "Is there any pain with passing your urine?" If so, ask: "How bad is the pain?"  (Scale 1-10; or mild, moderate, severe)    - MILD - complains slightly about urination hurting    - MODERATE - interferes with normal activities      - SEVERE - excruciating, unwilling or unable to urinate because of the pain      No.    I feel "sore" over my kidneys. 5. FEVER: "Do you have a fever?" If so, ask: "What is your temperature, how was it measured, and when did it start?"     No 6. ASSOCIATED SYMPTOMS: "Are you passing urine more frequently than usual?"     Yes due to all the water I'm drinking.    7. OTHER SYMPTOMS: "Do you have any other symptoms?" (e.g., back/flank pain, abdominal pain, vomiting)     Bilateral flank pain.   It's a dull ache.    8. PREGNANCY: "Is there any chance you are pregnant?" "When was your last menstrual  period?"     N/A  Protocols used: URINE - BLOOD IN-A-AH

## 2017-10-31 ENCOUNTER — Other Ambulatory Visit: Payer: Self-pay | Admitting: Cardiovascular Disease

## 2017-10-31 NOTE — Telephone Encounter (Signed)
Refill Request.  

## 2018-02-04 ENCOUNTER — Other Ambulatory Visit: Payer: Self-pay | Admitting: Internal Medicine

## 2018-02-05 NOTE — Telephone Encounter (Signed)
Last filled 07-14-17 #30 Last OV 07-14-17 Next OV 07-22-18 [facility]

## 2018-04-30 ENCOUNTER — Other Ambulatory Visit: Payer: Self-pay | Admitting: Internal Medicine

## 2018-04-30 NOTE — Telephone Encounter (Signed)
Last filled 02-20-18 #30 Last OV 07-14-17 Next OV 07-22-18 [facility]

## 2018-06-16 ENCOUNTER — Telehealth: Payer: Self-pay

## 2018-06-16 ENCOUNTER — Telehealth: Payer: Self-pay | Admitting: Cardiovascular Disease

## 2018-06-16 NOTE — Telephone Encounter (Signed)
Pt saw something on television about being on a ASA regimen to contact doctor to see if should stop the ASA regimen or continue it.pt said Dr Jari Sportsman office has pt on ASA regimen. Dr Alphonsus Sias out of office this afternoon and did warm transfer to [facility]  at [facility]ology [facility].

## 2018-06-16 NOTE — Telephone Encounter (Signed)
His situation is completely different from what he saw on TV.  He has to be on aspirin given his extensive cardiovascular disease and previous stent.

## 2018-06-16 NOTE — Telephone Encounter (Signed)
Left a message for the patient to call back for more information.

## 2018-06-16 NOTE — Telephone Encounter (Signed)
°  Pt called PCP (Dr. Alphonsus Sias) because he saw a TV  about if you are on an Asprin  regimen to contact doctor to see if should stop the Aspirin regimen or continue it.  The patient was transfered to me by Artelia Laroche at Dr Karle Starch office to consult with Dr. Kirke Corin, as Dr. Kirke Corin is the one who put the pt on the Aspirin regimen.   He did not mention the product that was being advertised, just wondered about the aspirin regimen in general

## 2018-06-16 NOTE — Telephone Encounter (Signed)
LEFT MESSAGE FOR PATIENT THAT WILL DEFER TO DR Kirke Corin

## 2018-06-17 NOTE — Telephone Encounter (Signed)
Left a message for the patient to call back.  

## 2018-06-19 NOTE — Telephone Encounter (Signed)
Left a message for the patient to call back.  

## 2018-06-22 NOTE — Telephone Encounter (Signed)
Left a message for the patient to call back.  This is the third attempt to reach the patient. Encounter will be closed. 

## 2018-06-29 ENCOUNTER — Other Ambulatory Visit: Payer: Self-pay

## 2018-06-29 MED ORDER — ATORVASTATIN CALCIUM 80 MG PO TABS
ORAL_TABLET | ORAL | 2 refills | Status: DC
Start: 2018-06-29 — End: 2019-03-05

## 2018-07-22 ENCOUNTER — Encounter: Payer: Self-pay | Admitting: Internal Medicine

## 2018-07-22 ENCOUNTER — Telehealth: Payer: Self-pay | Admitting: Internal Medicine

## 2018-07-22 ENCOUNTER — Other Ambulatory Visit: Payer: Self-pay

## 2018-07-22 ENCOUNTER — Ambulatory Visit (INDEPENDENT_AMBULATORY_CARE_PROVIDER_SITE_OTHER): Payer: PPO | Admitting: Internal Medicine

## 2018-07-22 DIAGNOSIS — Z0000 Encounter for general adult medical examination without abnormal findings: Secondary | ICD-10-CM | POA: Diagnosis not present

## 2018-07-22 DIAGNOSIS — Z7189 Other specified counseling: Secondary | ICD-10-CM | POA: Diagnosis not present

## 2018-07-22 DIAGNOSIS — I5022 Chronic systolic (congestive) heart failure: Secondary | ICD-10-CM | POA: Diagnosis not present

## 2018-07-22 DIAGNOSIS — G2581 Restless legs syndrome: Secondary | ICD-10-CM

## 2018-07-22 DIAGNOSIS — I2510 Atherosclerotic heart disease of native coronary artery without angina pectoris: Secondary | ICD-10-CM | POA: Diagnosis not present

## 2018-07-22 DIAGNOSIS — I739 Peripheral vascular disease, unspecified: Secondary | ICD-10-CM

## 2018-07-22 DIAGNOSIS — K219 Gastro-esophageal reflux disease without esophagitis: Secondary | ICD-10-CM | POA: Insufficient documentation

## 2018-07-22 DIAGNOSIS — J439 Emphysema, unspecified: Secondary | ICD-10-CM | POA: Diagnosis not present

## 2018-07-22 MED ORDER — PANTOPRAZOLE SODIUM 20 MG PO TBEC: 20.0000 mg | DELAYED_RELEASE_TABLET | Freq: Every day | ORAL | 3 refills | Status: DC | PRN

## 2018-07-22 MED ORDER — CLONAZEPAM 0.5 MG PO TABS: 0.5000 mg | ORAL_TABLET | Freq: Every evening | ORAL | 0 refills | Status: DC | PRN

## 2018-07-22 NOTE — Telephone Encounter (Signed)
I left a detailed message on patient's voice mail to call back and schedule lab work next week.

## 2018-07-22 NOTE — Assessment & Plan Note (Signed)
I have personally reviewed the Medicare Annual Wellness questionnaire and have noted 1. The patient's medical and social history 2. Their use of alcohol, tobacco or illicit drugs 3. Their current medications and supplements 4. The patient's functional ability including ADL's, fall risks, home safety risks and hearing or visual             impairment. 5. Diet and physical activities 6. Evidence for depression or mood disorders  The patients weight, height, BMI and visual acuity have been recorded in the chart I have made referrals, counseling and provided education to the patient based review of the above and I have provided the pt with a written personalized care plan for preventive services.  I have provided you with a copy of your personalized plan for preventive services. Please take the time to review along with your updated medication list.  He forgot the FIT--will do it now Still prefers no prostate cancer screening Discussed increasing exercise Flu vaccine in the fall

## 2018-07-22 NOTE — Assessment & Plan Note (Signed)
No apparent angina On appropriate regimen

## 2018-07-22 NOTE — Progress Notes (Signed)
Hearing Screening Comments: No checks in the last 12 months Vision Screening Comments: No checks in the last 12 months

## 2018-07-22 NOTE — Assessment & Plan Note (Signed)
Does okay with the clonazepam 

## 2018-07-22 NOTE — Assessment & Plan Note (Signed)
Minimal symptoms since stopping smoking No Rx needed

## 2018-07-22 NOTE — Assessment & Plan Note (Signed)
Compensated now Weight stable No symptoms No changes needed in meds

## 2018-07-22 NOTE — Assessment & Plan Note (Signed)
See social history 

## 2018-07-22 NOTE — Progress Notes (Signed)
Subjective:    Patient ID: Franklin Marks, male    DOB: Oct 18, 1950, 68 y.o.   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 aspirin 81 MG chewable tablet Chew 1 tablet (81 mg total) by mouth daily.    [name]. atorvastatin (LIPITOR) 80 MG tablet TAKE ONE (1) TABLET BY MOUTH EACH EVENING AT 6PM 90 tablet 2  . carvedilol (COREG) 6.25 MG tablet TAKE ONE-HALF (1/2) OF A TABLET BY MOUTH TWICE DAILY WITH A MEAL. 90 tablet 3  . clonazePAM (KLONOPIN) 0.5 MG tablet TAKE 1 TABLET (0.5 MG TOTAL) BY MOUTH AT BEDTIME AS NEEDED. 30 tablet 0  . clopidogrel (PLAVIX) 75 MG tablet TAKE ONE TABLET BY MOUTH EACH MORNING WITH BREAKFAST 90 tablet 3  . lisinopril (PRINIVIL,ZESTRIL) 5 MG tablet TAKE 1/2 TABLET BY MOUTH EVERY DAY. 45 tablet 3  . omega-3 acid ethyl esters (LOVAZA) 1 g capsule TAKE 2 CAPSULES (2,000 MG TOTAL) BY MOUTH DAILY. 60 capsule 11   No current facility-administered medications on file prior to visit.     Allergies  Allergen Reactions  . Codeine Nausea And Vomiting    Past Medical History:  Diagnosis Date  . Anemia   . Atrial fibrillation (HCC)  01/13/2012  . CAD (coronary artery disease)    s/p CABG 2001  . COPD (chronic obstructive pulmonary disease) (HCC)    ongoing tobacco use  . GERD (gastroesophageal reflux disease)   . Ischemic cardiomyopathy 01/10/2012   LVEF 35-40%, mild conc hypertrophy, servere HK of anteroseptum, borderline RVH  . NSTEMI (non-ST elevated myocardial infarction) (HCC) 01/10/2012   Associated with ventricular fibrillation    Past Surgical History:  Procedure Laterality Date  . CARDIAC [facility]IZATION  2011   [facility]  . CARDIAC CATHETERIZATION  12/2011   prox LAD occlusion, ostial RCA occlusion, 30% prox LCx stenosis, LIMA-LAD: 80% post-anastamosis lesion, SVG-Dx2: old occlusion w/ thrombus (noted on prior 2011 cath at [facility]), SVG-Dx: patent, SVG-RCA: patent, diffuse irregs, 80-90% PDA/PLA lesions patent  . CARDIAC CATHETERIZATION N/A 03/15/2015   Procedure: Left Heart Cath and Cors/Grafts Angiography;  Surgeon: Kathleene Hazelhristopher D McAlhany, MD;  [facility];  Service: Cardiovascular;  Laterality: N/A;  . CARDIAC CATHETERIZATION N/A 03/15/2015   Procedure: Coronary Stent Intervention;  Surgeon: Kathleene Hazelhristopher D McAlhany, MD;  Location: [facility] INVASIVE CV LAB;  Service: Cardiovascular;  Laterality: N/A;  svg to diagnoal 2  . CORONARY ARTERY BYPASS GRAFT  2001  . LEFT HEART CATHETERIZATION WITH CORONARY ANGIOGRAM N/A 01/10/2012   Procedure: LEFT HEART CATHETERIZATION WITH CORONARY ANGIOGRAM;  Surgeon: Lennette Biharihomas A Kelly, MD;  [facility];  Service: Cardiovascular;  Laterality: N/A;    Family History  Problem Relation  Age of Onset  . Hyperlipidemia Mother   . Alcohol abuse Father   . Hyperlipidemia Father   . Heart disease Sister   . Hyperlipidemia Sister   . Heart disease Brother   . Hyperlipidemia Brother   . Hyperlipidemia Sister   . Diabetes Neg Hx   . Hypertension Neg Hx     Social History   Socioeconomic History  . Marital status: Married    Spouse name: Not on file  .  Number of children: 4  . Years of education: Not on file  . Highest education level: Not on file  Occupational History  . Occupation: Management --now disabled  . Occupation: Janitorial work    Comment: part time  Social Needs  . Financial resource strain: Not on file  . Food insecurity:    Worry: Not on file    Inability: Not on file  . Transportation needs:    Medical: Not on file    Non-medical: Not on file  Tobacco Use  . Smoking status: Former Smoker    Packs/day: 1.00    Years: 45.00    Pack years: 45.00    Types: Cigarettes    Last attempt to quit: 03/13/2015    Years since quitting: 3.3  . Smokeless tobacco: Never Used  Substance and Sexual Activity  . Alcohol use: Yes    Alcohol/week: 0.0 standard drinks    Comment: really rare beer  . Drug use: Yes    Types: Marijuana  . Sexual activity: Not Currently  Lifestyle  . Physical activity:    Days per week: Not on file    Minutes per session: Not on file  . Stress: Not on file  Relationships  . Social connections:    Talks on phone: Not on file    Gets together: Not on file    Attends religious service: Not on file    Active member of club or organization: Not on file    Attends meetings of clubs or organizations: Not on file    Relationship status: Not on file  . Intimate partner violence:    Fear of current or ex partner: Not on file    Emotionally abused: Not on file    Physically abused: Not on file    Forced sexual activity: Not on file  Other Topics Concern  . Not on file  Social History Narrative   Married twice   3 children from 1st marriage-- 1 from second   Worked as Investment banker, operational      Has living will   Wife has health care POA---alternate would be daughter Judeth Cornfield   Would accept resuscitation--- but no prolonged ventilation   No tube feeds if cognitively unaware   Review of Systems Full dentures--no problems Appetite is good Weight has been stable Sleeps okay--still gets leg  cramps Wears seat belt No sig back or joint pains Bowels are regular. No blood Voids fine---- nocturia x 2 is stable. Empties okay as far as he can tell Still occasional heartburn. No dysphagia No rash or skin lesions    Objective:   Physical Exam  Constitutional: He is oriented to person, place, and time. He appears well-developed. No distress.  Respiratory: Effort normal. No respiratory distress.  Neurological: He is alert and oriented to person, place, and time.  President--- "Benson [place], Bush" [phone] [id number] Recall 3/3  Skin:  No foot lesions  Psychiatric: He has a normal mood and affect. His behavior is normal.  Assessment & Plan:

## 2018-07-22 NOTE — Patient Instructions (Signed)
Please start the omeprazole for your heartburn.  Only use it as needed

## 2018-07-22 NOTE — Assessment & Plan Note (Addendum)
Intermittent Can't get zantac now Will Rx PPI for prn use

## 2018-07-22 NOTE — Assessment & Plan Note (Signed)
Doing well since procedures and stopping smoking

## 2018-07-25 ENCOUNTER — Other Ambulatory Visit: Payer: Self-pay | Admitting: Internal Medicine

## 2018-07-28 ENCOUNTER — Other Ambulatory Visit: Payer: Self-pay | Admitting: Internal Medicine

## 2018-07-29 ENCOUNTER — Other Ambulatory Visit (INDEPENDENT_AMBULATORY_CARE_PROVIDER_SITE_OTHER): Payer: PPO

## 2018-07-29 DIAGNOSIS — I2510 Atherosclerotic heart disease of native coronary artery without angina pectoris: Secondary | ICD-10-CM

## 2018-07-29 LAB — CBC
HCT: 40.7 % (ref 39.0–52.0)
Hemoglobin: 14.1 g/dL (ref 13.0–17.0)
MCHC: 34.6 g/dL (ref 30.0–36.0)
MCV: 90.6 fl (ref 78.0–100.0)
Platelets: 177 10*3/uL (ref 150.0–400.0)
RBC: 4.49 Mil/uL (ref 4.22–5.81)
RDW: 13.2 % (ref 11.5–15.5)
WBC: 9.2 10*3/uL (ref 4.0–10.5)

## 2018-07-29 LAB — COMPREHENSIVE METABOLIC PANEL WITH GFR
ALT: 16 U/L (ref 0–53)
AST: 17 U/L (ref 0–37)
Albumin: 4.1 g/dL (ref 3.5–5.2)
Alkaline Phosphatase: 116 U/L (ref 39–117)
BUN: 13 mg/dL (ref 6–23)
CO2: 27 meq/L (ref 19–32)
Calcium: 9.1 mg/dL (ref 8.4–10.5)
Chloride: 105 meq/L (ref 96–112)
Creatinine, Ser: 0.87 mg/dL (ref 0.40–1.50)
GFR: 87.4 mL/min
Glucose, Bld: 107 mg/dL — ABNORMAL HIGH (ref 70–99)
Potassium: 4.2 meq/L (ref 3.5–5.1)
Sodium: 138 meq/L (ref 135–145)
Total Bilirubin: 0.7 mg/dL (ref 0.2–1.2)
Total Protein: 7.1 g/dL (ref 6.0–8.3)

## 2018-07-29 LAB — LIPID PANEL
Cholesterol: 85 mg/dL (ref 0–200)
HDL: 29.8 mg/dL — ABNORMAL LOW
LDL Cholesterol: 33 mg/dL (ref 0–99)
NonHDL: 54.87
Total CHOL/HDL Ratio: 3
Triglycerides: 107 mg/dL (ref 0.0–149.0)
VLDL: 21.4 mg/dL (ref 0.0–40.0)

## 2018-08-20 ENCOUNTER — Telehealth: Payer: Self-pay | Admitting: Cardiovascular Disease

## 2018-08-21 NOTE — Telephone Encounter (Signed)
°  smartphone/ verbal consent/ my chart active/ pre reg completed  °

## 2018-08-24 NOTE — Telephone Encounter (Signed)
Virtual Visit Pre-Appointment Phone Call  "(Name), I am calling you today to discuss your upcoming appointment. We are currently trying to limit exposure to the virus that causes COVID-19 by seeing patients at home rather than in the office."  1. "What is the BEST phone number to call the day of the visit?" - include this in appointment notes  2. "Do you have or have access to (through a family member/friend) a smartphone with video capability that we can use for your visit?" a. If yes - list this number in appt notes as "cell" (if different from BEST phone #) and list the appointment type as a VIDEO visit in appointment notes b. If no - list the appointment type as a PHONE visit in appointment notes  3. Confirm consent - "In the setting of the current Covid19 crisis, you are scheduled for a (phone or video) visit with your provider on (date) at (time).  Just as we do with many in-office visits, in order for you to participate in this visit, we must obtain consent.  If you'd like, I can send this to your mychart (if signed up) or email for you to review.  Otherwise, I can obtain your verbal consent now.  All virtual visits are billed to your insurance company just like a normal visit would be.  By agreeing to a virtual visit, we'd like you to understand that the technology does not allow for your provider to perform an examination, and thus may limit your provider's ability to fully assess your condition. If your provider identifies any concerns that need to be evaluated in person, we will make arrangements to do so.  Finally, though the technology is pretty good, we cannot assure that it will always work on either your or our end, and in the setting of a video visit, we may have to convert it to a phone-only visit.  In either situation, we cannot ensure that we have a secure connection.  Are you willing to proceed?Yes  4. Advise patient to be prepared - "Two hours prior to your appointment, go  ahead and check your blood pressure, pulse, oxygen saturation, and your weight (if you have the equipment to check those) and write them all down. When your visit starts, your provider will ask you for this information. If you have an Apple Watch or Kardia device, please plan to have heart rate information ready on the day of your appointment. Please have a pen and paper handy nearby the day of the visit as well."  5. Give patient instructions for MyChart download to smartphone OR Doximity/Doxy.me as below if video visit (depending on what platform provider is using)  6. Inform patient they will receive a phone call 15 minutes prior to their appointment time (may be from unknown caller ID) so they should be prepared to answer    TELEPHONE CALL NOTE  Franklin Marks has been deemed a candidate for a follow-up tele-health visit to limit community exposure during the Covid-19 pandemic. I spoke with the patient via phone to ensure availability of phone/video source, confirm preferred email & phone number, and discuss instructions and expectations.  I reminded Franklin Marks to be prepared with any vital sign and/or heart rhythm information that could potentially be obtained via home monitoring, at the time of his visit. I reminded Franklin Marks to expect a phone call prior to his visit.  Ricci Barker, RN 08/24/2018 11:39 AM   INSTRUCTIONS FOR  DOWNLOADING THE MYCHART APP TO SMARTPHONE  - The patient must first make sure to have activated MyChart and know their login information - If Apple, go to [employer] and type in MyChart in the search bar and download the app. If Android, ask patient to go to [employer] and type in [place] in the search bar and download the app. The app is free but as with any other app downloads, their phone may require them to verify saved payment information or Apple/Android password.  - The patient will need to then log into the app with their MyChart username and  password, and select [facility] as their healthcare provider to link the account. When it is time for your visit, go to the MyChart app, find appointments, and click Begin Video Visit. Be sure to Select Allow for your device to access the Microphone and Camera for your visit. You will then be connected, and your provider will be with you shortly.  **If they have any issues connecting, or need assistance please contact MyChart service desk [phone])**  **If using a computer, in order to ensure the best quality for their visit they will need to use either of the following Internet Browsers: [employer], or Google Chrome**  IF USING DOXIMITY or DOXY.ME - The patient will receive a link just prior to their visit by text.     FULL LENGTH CONSENT FOR TELE-HEALTH VISIT   I hereby voluntarily request, consent and authorize [facility] and its employed or contracted physicians, physician assistants, nurse practitioners or other licensed health care professionals (the Practitioner), to provide me with telemedicine health care services (the "Services") as deemed necessary by the treating Practitioner. I acknowledge and consent to receive the Services by the Practitioner via telemedicine. I understand that the telemedicine visit will involve communicating with the Practitioner through live audiovisual communication technology and the disclosure of certain medical information by electronic transmission. I acknowledge that I have been given the opportunity to request an in-person assessment or other available alternative prior to the telemedicine visit and am voluntarily participating in the telemedicine visit.  I understand that I have the right to withhold or withdraw my consent to the use of telemedicine in the course of my care at any time, without affecting my right to future care or treatment, and that the Practitioner or I may terminate the telemedicine visit at any time. I  understand that I have the right to inspect all information obtained and/or recorded in the course of the telemedicine visit and may receive copies of available information for a reasonable fee.  I understand that some of the potential risks of receiving the Services via telemedicine include:  [name] Delay or interruption in medical evaluation due to technological equipment failure or disruption; . Information transmitted may not be sufficient (e.g. poor resolution of images) to allow for appropriate medical decision making by the Practitioner; and/or  . In rare instances, security protocols could fail, causing a breach of personal health information.  Furthermore, I acknowledge that it is my responsibility to provide information about my medical history, conditions and care that is complete and accurate to the best of my ability. I acknowledge that Practitioner's advice, recommendations, and/or decision may be based on factors not within their control, such as incomplete or inaccurate data provided by me or distortions of diagnostic images or specimens that may result from electronic transmissions. I understand that the practice of medicine is not an exact science and that  Practitioner makes no warranties or guarantees regarding treatment outcomes. I acknowledge that I will receive a copy of this consent concurrently upon execution via email to the email address I last provided but may also request a printed copy by calling the office of [place] [facility].    I understand that my insurance will be billed for this visit.   I have read or had this consent read to me. . I understand the contents of this consent, which adequately explains the benefits and risks of the Services being provided via telemedicine.  . I have been provided ample opportunity to ask questions regarding this consent and the Services and have had my questions answered to my satisfaction. . I give my informed consent for the services to be  provided through the use of telemedicine in my medical care  By participating in this telemedicine visit I agree to the above.

## 2018-08-25 ENCOUNTER — Telehealth (INDEPENDENT_AMBULATORY_CARE_PROVIDER_SITE_OTHER): Payer: PPO | Admitting: Cardiovascular Disease

## 2018-08-25 DIAGNOSIS — I2510 Atherosclerotic heart disease of native coronary artery without angina pectoris: Secondary | ICD-10-CM | POA: Diagnosis not present

## 2018-08-25 DIAGNOSIS — I739 Peripheral vascular disease, unspecified: Secondary | ICD-10-CM

## 2018-08-25 DIAGNOSIS — I5022 Chronic systolic (congestive) heart failure: Secondary | ICD-10-CM

## 2018-08-25 NOTE — Progress Notes (Signed)
Virtual Visit via Video Note   This visit type was conducted due to national recommendations for restrictions regarding the COVID-19 Pandemic (e.g. social distancing) in an effort to limit this patient's exposure and mitigate transmission in our community.  Due to his co-morbid illnesses, this patient is at least at moderate risk for complications without adequate follow up.  This format is felt to be most appropriate for this patient at this time.  All issues noted in this document were discussed and addressed.  A limited physical exam was performed with this format.  Please refer to the patient's chart for his consent to telehealth [facility].   Date:  08/25/2018   ID:  Franklin GulaLarry S Dimitri, DOB 09/23/1950, MRN [id number]  Patient Location: Home Provider Location: Home  PCP:  Karie SchwalbeLetvak, Richard I, MD  Cardiologist:  Lorine BearsMuhammad Sarath Privott, MD  Electrophysiologist:  None   Evaluation Performed:  Follow-Up Visit  Chief Complaint: Doing very well with no complaints  History of Present Illness:    Franklin Marks is a 68 y.o. male was reached via video for follow-up visit regarding peripheral arterial disease,coronary artery disease and chronic systolic heart failure due to ischemic cardiomyopathy.  We started with a video app but the patient could not connect his audio and thus I had to switch to a phone.  He has known history of cardiac arrest occurring in the setting of coronary artery disease and chest pain. He is status post CABG in 2001. He has known history of peripheral arterial disease with mildly reduced ABI bilaterally and chronic total occlusion of the SFA on both sides with reconstitution distally via collaterals.  He had non-ST elevation myocardial infarction in December 2016. He underwent PCI and drug-eluting stent placement to the SVG to second diagonal. EF was 35-45%.  Most recent echocardiogram in June 2019 showed an EF of 45 to 50% with mild tricuspid regurgitation.  He quit  smoking cigars in 2018.  He has been doing very well with no chest pain, shortness of breath or palpitations.  He denies leg claudication in spite of being more active than before.  The patient does not have symptoms concerning for COVID-19 infection (fever, chills, cough, or new shortness of breath).    Past Medical History:  Diagnosis Date  . Anemia   . Atrial fibrillation (HCC) 01/13/2012  . CAD (coronary artery disease)    s/p CABG 2001  . COPD (chronic obstructive pulmonary disease) (HCC)    ongoing tobacco use  . GERD (gastroesophageal reflux disease)   . Ischemic cardiomyopathy 01/10/2012   LVEF 35-40%, mild conc hypertrophy, servere HK of anteroseptum, borderline RVH  . NSTEMI (non-ST elevated myocardial infarction) (HCC) 01/10/2012   Associated with ventricular fibrillation   Past Surgical History:  Procedure Laterality Date  . CARD[facility]  [facility]   [facility]  . CARDIAC CATHETERIZATION  12/2011   prox LAD occlusion, ostial RCA occlusion, 30% prox LCx stenosis, LIMA-LAD: 80% post-anastamosis lesion, SVG-Dx2: old occlusion w/ thrombus (noted on prior [facility] cath [facility]), SVG-Dx: patent, SVG-RCA: patent, diffuse irregs, 80-90% PDA/PLA lesions patent  . CARDIAC CATHETERIZATION N/A 03/15/2015   Procedure: Left Heart Cath and Cors/Grafts Angiography;  Surgeon: Kathleene Hazelhristopher D McAlhany, MD;  [facility];  Service: Cardiovascular;  Laterality: N/A;  . CARDIAC CATHETERIZATION N/A 03/15/2015   Procedure: Coronary Stent Intervention;  Surgeon: Kathleene Hazelhristopher D McAlhany, MD;  Location: [facility] INVASIVE CV LAB;  Service: Cardiovascular;  Laterality: N/A;  svg to diagnoal 2  .  CORONARY ARTERY BYPASS GRAFT  2001  . LEFT HEART CATHETERIZATION WITH CORONARY ANGIOGRAM N/A 01/10/2012   Procedure: LEFT HEART CATHETERIZATION WITH CORONARY ANGIOGRAM;  Surgeon: Troy Sine, MD;  Location: [facility] CATH LAB;  Service: Cardiovascular;  Laterality: N/A;     Current Meds  Medication  Sig  . aspirin 81 MG chewable tablet Chew 1 tablet (81 mg total) by mouth daily.  [name] atorvastatin (LIPITOR) 80 MG tablet TAKE ONE (1) TABLET BY MOUTH EACH EVENING AT 6PM  . carvedilol (COREG) 6.25 MG tablet TAKE ONE-HALF (1/2) OF A TABLET BY MOUTH TWICE DAILY WITH A MEAL.  . clonazePAM (KLONOPIN) 0.5 MG tablet Take 1 tablet (0.5 mg total) by mouth at bedtime as needed.  . clopidogrel (PLAVIX) 75 MG tablet TAKE ONE TABLET BY MOUTH EACH MORNING WITH BREAKFAST  . lisinopril (ZESTRIL) 5 MG tablet TAKE 1/2 TABLET BY MOUTH EVERY DAY.  [name] omega-3 acid ethyl esters (LOVAZA) 1 g capsule TAKE 2 CAPSULES (2,000 MG TOTAL) BY MOUTH DAILY.  . pantoprazole (PROTONIX) 20 MG tablet Take 1 tablet (20 mg total) by mouth daily as needed.     Allergies:   Codeine   Social History   Tobacco Use  . Smoking status: Former Smoker    Packs/day: 1.00    Years: 45.00    Pack years: 45.00    Types: Cigarettes    Last attempt to quit: 03/13/2015    Years since quitting: 3.4  . Smokeless tobacco: Never Used  Substance Use Topics  . Alcohol use: Yes    Alcohol/week: 0.0 standard drinks    Comment: really rare beer  . Drug use: Yes    Types: Marijuana     Family Hx: The patient's family history includes Alcohol abuse in his father; Heart disease in his brother and sister; Hyperlipidemia in his brother, father, mother, sister, and sister. There is no history of Diabetes or Hypertension.  ROS:   Please see the history of present illness.     All other systems reviewed and are negative.   Prior CV studies:   The following studies were reviewed today:  I reviewed echocardiogram with him from last year which showed an EF of 45 to 50%  Labs/Other Tests and Data Reviewed:    EKG:  No ECG reviewed.  Recent Labs: 07/29/2018: ALT 16; BUN 13; Creatinine, Ser 0.87; Hemoglobin 14.1; Platelets 177.0; Potassium 4.2; Sodium 138   Recent Lipid Panel Lab Results  Component Value Date/Time   CHOL 85 07/29/2018  09:23 AM   TRIG 107.0 07/29/2018 09:23 AM   HDL 29.80 (L) 07/29/2018 09:23 AM   CHOLHDL 3 07/29/2018 09:23 AM   LDLCALC 33 07/29/2018 09:23 AM   LDLDIRECT 55.0 07/14/2017 03:52 PM    Wt Readings from Last 3 Encounters:  08/19/17 175 lb (79.4 kg)  07/14/17 173 lb (78.5 kg)  08/27/16 163 lb (73.9 kg)     Objective:    Vital Signs:  There were no vitals taken for this visit.   VITAL SIGNS:  reviewed  ASSESSMENT & PLAN:    1.  Peripheral arterial disease: Patient has known occluded SFA bilaterally. He denies claudication at the present time. Continue medical therapy.  2. Coronary artery disease status post angioplasty and drug-eluting stent placement of SVG to second diagonal in December 2016.   Continue long-term dual antiplatelet therapy as tolerated.  3. Hyperlipidemia: He did have elevated triglyceride last year.  However, lipid profile from May of this year showed significant improvement with  a triglyceride of 107.  LDL was 33.  Continue atorvastatin and fish oil.  4. Essential hypertension: Blood pressure is well controlled on current medications.   5.  Chronic systolic heart failure due to mild ischemic cardiomyopathy: EF 45 to 50%.  Continue carvedilol and enalapril  COVID-19 Education: The signs and symptoms of COVID-19 were discussed with the patient and how to seek care for testing (follow up with PCP or arrange E-visit).  The importance of social distancing was discussed today.  Time:   Today, I have spent 10 minutes with the patient with telehealth technology discussing the above problems.     Medication Adjustments/Labs and Tests Ordered: Current medicines are reviewed at length with the patient today.  Concerns regarding medicines are outlined above.   Tests Ordered: No orders of the defined types were placed in this encounter.   Medication Changes: No orders of the defined types were placed in this encounter.   Disposition:  Follow up in 6  month(s)  Signed, Lorine BearsMuhammad Javonne Dorko, MD  08/25/2018 9:46 AM    [facility]

## 2018-08-25 NOTE — Patient Instructions (Signed)
Medication Instructions:  Continue same medications.  If you need a refill on your cardiac medications before your next appointment, please call your pharmacy.   Lab work: None If you have labs (blood work) drawn today and your tests are completely normal, you will receive your results only by: . MyChart Message (if you have MyChart) OR . A paper copy in the mail If you have any lab test that is abnormal or we need to change your treatment, we will call you to review the results.  Testing/Procedures: None  Follow-Up: At [facility], you and your health needs are our priority.  As part of our continuing mission to provide you with exceptional heart care, we have created designated Provider Care Teams.  These Care Teams include your primary Cardiologist (physician) and Advanced Practice Providers (APPs -  Physician Assistants and Nurse Practitioners) who all work together to provide you with the care you need, when you need it. You will need a follow up appointment in 6 months.  Please call our office 2 months in advance to schedule this appointment.  You may see Kaylynne Andres, MD or one of the following Advanced Practice Providers on your designated Care Team:   Luke Kilroy, PA-C Krista Kroeger, PA-C . Callie Goodrich, PA-C    

## 2018-09-16 ENCOUNTER — Telehealth: Payer: Self-pay | Admitting: *Deleted

## 2018-09-16 ENCOUNTER — Telehealth: Payer: Self-pay | Admitting: Internal Medicine

## 2018-09-16 DIAGNOSIS — Z20822 Contact with and (suspected) exposure to covid-19: Secondary | ICD-10-CM

## 2018-09-16 NOTE — Telephone Encounter (Signed)
Spoke to pt's wife, Lesleigh Noe, who is the who actually called and left a message.   She does want him to scheduled for testing. Best number to call is [phone].

## 2018-09-16 NOTE — Telephone Encounter (Signed)
Let him know that I put in the request for him to be tested---he should hear about this soon

## 2018-09-16 NOTE — Telephone Encounter (Signed)
LM for pt to call back @ [phone] M-F 7a-7p. Order placed

## 2018-09-16 NOTE — Telephone Encounter (Signed)
-----   Message from Venia Carbon, MD sent at 09/16/2018  1:16 PM EDT ----- Please arrange testing for him. He was exposed to his granddaughter who subsequently tested positive for COVID.  Thanks!

## 2018-09-16 NOTE — Telephone Encounter (Signed)
She probably didn't have it on the 20th but it is possible. I can set up testing if he would like---otherwise, he should basically quarantine for another week or so to be sure he doesn't come down with symptoms

## 2018-09-16 NOTE — Telephone Encounter (Signed)
Best number [phone]  Pt called stating his granddaughter tested positive for covid 6/30.  Pt was around her 09/05/2018.  Pt wanted to know if he should be concerned.     No  symptoms

## 2018-09-16 NOTE — Telephone Encounter (Signed)
Left message on VM.

## 2018-09-16 NOTE — Telephone Encounter (Signed)
Patient returned call and has been scheduled for testing. 

## 2018-09-17 ENCOUNTER — Other Ambulatory Visit: Payer: PPO

## 2018-09-17 DIAGNOSIS — Z20822 Contact with and (suspected) exposure to covid-19: Secondary | ICD-10-CM

## 2018-09-17 DIAGNOSIS — R6889 Other general symptoms and signs: Secondary | ICD-10-CM | POA: Diagnosis not present

## 2018-09-23 LAB — NOVEL CORONAVIRUS, NAA: SARS-CoV-2, NAA: NOT DETECTED

## 2018-10-19 ENCOUNTER — Other Ambulatory Visit: Payer: Self-pay

## 2018-10-20 MED ORDER — CLONAZEPAM 0.5 MG PO TABS: 0.5000 mg | ORAL_TABLET | Freq: Every evening | ORAL | 0 refills | Status: DC | PRN

## 2018-10-20 NOTE — Telephone Encounter (Signed)
Last filled 07-1918 #30 Last OV 07-22-18 CPE No Future OV [facility]

## 2018-10-21 ENCOUNTER — Telehealth: Payer: Self-pay

## 2018-10-21 MED ORDER — CLOPIDOGREL BISULFATE 75 MG PO TABS: ORAL_TABLET | ORAL | 0 refills | Status: DC

## 2018-10-21 NOTE — Telephone Encounter (Signed)
[facility] for [facility].

## 2019-01-06 ENCOUNTER — Telehealth: Payer: Self-pay | Admitting: Internal Medicine

## 2019-01-06 NOTE — Telephone Encounter (Signed)
Pt wanted to know if he is due a flu shot   Also  He received a call from tom tillis update on covid  (recording) stated there was a medication (5 shot) for people at high risk for covid.  He would like more information and  can he get this

## 2019-01-06 NOTE — Telephone Encounter (Signed)
Mandy Do you know anything about this?? Legrand Rams recording

## 2019-01-06 NOTE — Telephone Encounter (Signed)
Spoke to pt. Advised him he has not gotten his flu shot from what I can tell. He will do that. Advised him we were not aware of any shots for Covid right now.

## 2019-01-07 NOTE — Telephone Encounter (Signed)
No, I am not aware of any specific shots or medications for COVID at this point. Have no idea what the phone call is about.

## 2019-01-11 ENCOUNTER — Other Ambulatory Visit: Payer: Self-pay | Admitting: Cardiovascular Disease

## 2019-01-11 ENCOUNTER — Other Ambulatory Visit: Payer: Self-pay | Admitting: Internal Medicine

## 2019-01-11 NOTE — Telephone Encounter (Signed)
Refill Request.  

## 2019-01-11 NOTE — Telephone Encounter (Signed)
Last filled 10-20-18 #30 Last OV 07-22-18 Next OV 07-27-19 [facility]

## 2019-03-05 ENCOUNTER — Other Ambulatory Visit: Payer: Self-pay | Admitting: Cardiovascular Disease

## 2019-03-05 ENCOUNTER — Other Ambulatory Visit: Payer: Self-pay | Admitting: Internal Medicine

## 2019-03-05 NOTE — Telephone Encounter (Signed)
Last filled 01-11-19 #30 Last OV 07-22-18 Next OV 07-27-19 [facility]

## 2019-03-08 ENCOUNTER — Encounter: Payer: Self-pay | Admitting: Family

## 2019-03-08 ENCOUNTER — Ambulatory Visit (INDEPENDENT_AMBULATORY_CARE_PROVIDER_SITE_OTHER): Payer: PPO | Admitting: Family

## 2019-03-08 ENCOUNTER — Other Ambulatory Visit: Payer: Self-pay

## 2019-03-08 VITALS — BP 120/60 | HR 60 | Temp 97.1°F | Ht 68.5 in | Wt 175.0 lb

## 2019-03-08 DIAGNOSIS — I10 Essential (primary) hypertension: Secondary | ICD-10-CM

## 2019-03-08 DIAGNOSIS — E785 Hyperlipidemia, unspecified: Secondary | ICD-10-CM

## 2019-03-08 DIAGNOSIS — I5022 Chronic systolic (congestive) heart failure: Secondary | ICD-10-CM

## 2019-03-08 DIAGNOSIS — I739 Peripheral vascular disease, unspecified: Secondary | ICD-10-CM

## 2019-03-08 DIAGNOSIS — I2510 Atherosclerotic heart disease of native coronary artery without angina pectoris: Secondary | ICD-10-CM | POA: Diagnosis not present

## 2019-03-08 NOTE — Progress Notes (Signed)
Office Visit    Patient Name: Franklin GulaLarry S Marks Date of Encounter: 03/08/2019  Primary Care Provider:  Karie SchwalbeLetvak, Richard I, MD Primary Cardiologist:  Lorine BearsMuhammad Arida, MD Electrophysiologist:  None   Chief Complaint    Franklin Marks is a 68 y.o. male with a hx of PAD, CAD s/p CABG, chronic systolic heart failure due to ICM, HTN, HLD presents today for 6237-month follow-up of CAD and heart failure.  Past Medical History    Past Medical History:  Diagnosis Date  . Anemia   . Atrial fibrillation (HCC) 01/13/2012  . CAD (coronary artery disease)    s/p CABG 2001  . COPD (chronic obstructive pulmonary disease) (HCC)    ongoing tobacco use  . GERD (gastroesophageal reflux disease)   . Ischemic cardiomyopathy 01/10/2012   LVEF 35-40%, mild conc hypertrophy, servere HK of anteroseptum, borderline RVH  . NSTEMI (non-ST elevated myocardial infarction) (HCC) 01/10/2012   Associated with ventricular fibrillation   Past Surgical History:  Procedure Laterality Date  . CARDIAC CATHETERIZATION  2011   At [facility]  . CARDIAC CATHETERIZATION  12/2011   prox LAD occlusion, ostial RCA occlusion, 30% prox LCx stenosis, LIMA-LAD: 80% post-anastamosis lesion, SVG-Dx2: old occlusion w/ thrombus (noted on prior 2011 cath at [facility]), SVG-Dx: patent, SVG-RCA: patent, diffuse irregs, 80-90% PDA/PLA lesions patent  . CARDIAC CATHETERIZATION N/A 03/15/2015   Procedure: Left Heart Cath and Cors/Grafts Angiography;  Surgeon: Kathleene Hazelhristopher D McAlhany, MD;  [facility];  Service: Cardiovascular;  Laterality: N/A;  . CARDIAC CATHETERIZATION N/A 03/15/2015   Procedure: Coronary Stent Intervention;  Surgeon: Kathleene Hazelhristopher D McAlhany, MD;  Location: [facility] INVASIVE CV LAB;  Service: Cardiovascular;  Laterality: N/A;  svg to diagnoal 2  . CORONARY ARTERY BYPASS GRAFT  2001  . LEFT HEART CATHETERIZATION WITH CORONARY ANGIOGRAM N/A 01/10/2012   Procedure: LEFT HEART CATHETERIZATION WITH CORONARY ANGIOGRAM;   Surgeon: Lennette Biharihomas A Kelly, MD;  [facility];  Service: Cardiovascular;  Laterality: N/A;    Allergies  Allergies  Allergen Reactions  . Codeine Nausea And Vomiting    History of Present Illness    Franklin Marks is a 68 y.o. male with a hx of PAD, CAD s/p CABG, chronic systolic heart failure due to ICM, HTN, HLD last seen 08/25/2018 by Dr. Kirke CorinArida.  He presents today for 2737-month follow-up of his CAD and heart failure.  Known history of cardiac arrest in the setting of CAD and chest pain.  S/p CABG in 2001.  NSTEMI 02/2015 with PCI/DES to SVG to second diagonal.  EF 35 to 45%  Peripheral arterial disease known mildly reduced ABI bilaterally and chronic total occlusion of SFA bilaterally with reconstitution distally via collaterals.  Echo 08/2017 EF 45 to 50% with mild tricuspid regurgitation.  He reports feeling well.  Works part-time doing Production designer, theatre/television/filmmaintenance for [employer] and lives, enjoys his work.  He is married and his wife still works full-time.  He reports no chest pain, pressure, tightness since last seen.  He reports no shortness of breath at rest.  Does report some dyspnea on exertion it seems like it is a bit more than when he was last seen.  He does not have a regular exercise regimen, we discussed adding a walking regimen.  He reports no lower extremity edema, no orthopnea, no PND.  Tells me he has made significant changes to his diet focusing on grilled foods and avoiding fried foods.  No lightheadedness, dizziness, near-syncope, syncope.  EKGs/Labs/Other Studies Reviewed:  The following studies were reviewed today:  Echo 08/22/17 - Left ventricle: The cavity size was normal. Wall thickness was   normal. Systolic function was mildly reduced. The estimated   ejection fraction was in the range of 45% to 50%. Diffuse   hypokinesis. The study is not technically sufficient to allow   evaluation of LV diastolic function. - Left atrium: The atrium was normal in size. - Right  ventricle: The cavity size was mildly dilated. Mildly   reduced systolic function. - Right atrium: The atrium was mildly dilated. - Tricuspid valve: There was mild regurgitation. - Pulmonary arteries: PA peak pressure: 34 mm Hg (S). - Inferior vena cava: The vessel was normal in size. The   respirophasic diameter changes were in the normal range (>= 50%),   consistent with normal central venous pressure.  EKG:  EKG is ordered today.  The ekg ordered today demonstrates sinus rhythm with sinus arrhythmia and first-degree AV block, RBBB.  Rate 60, PR 233.  Stable compared to previous.  Recent Labs: 07/29/2018: ALT 16; BUN 13; Creatinine, Ser 0.87; Hemoglobin 14.1; Platelets 177.0; Potassium 4.2; Sodium 138  Recent Lipid Panel    Component Value Date/Time   CHOL 85 07/29/2018 0923   TRIG 107.0 07/29/2018 0923   HDL 29.80 (L) 07/29/2018 0923   CHOLHDL 3 07/29/2018 0923   VLDL 21.4 07/29/2018 0923   LDLCALC 33 07/29/2018 0923   LDLDIRECT 55.0 07/14/2017 1552    Home Medications   Current Meds  Medication Sig  . aspirin 81 MG chewable tablet Chew 1 tablet (81 mg total) by mouth daily.  [name] atorvastatin (LIPITOR) 80 MG tablet TAKE ONE (1) TABLET BY MOUTH EACH EVENING AT 6PM  . carvedilol (COREG) 6.25 MG tablet TAKE ONE-HALF (1/2) OF A TABLET BY MOUTH TWICE DAILY WITH A MEAL.  . clonazePAM (KLONOPIN) 0.5 MG tablet TAKE 1 TABLET (0.5 MG TOTAL) BY MOUTH AT BEDTIME AS NEEDED.  [name] clopidogrel (PLAVIX) 75 MG tablet TAKE ONE TABLET BY MOUTH EACH MORNING WITH BREAKFAST  . lisinopril (ZESTRIL) 5 MG tablet TAKE 1/2 TABLET BY MOUTH EVERY DAY  . omega-3 acid ethyl esters (LOVAZA) 1 g capsule TAKE 2 CAPSULES (2,000 MG TOTAL) BY MOUTH DAILY.  . pantoprazole (PROTONIX) 20 MG tablet Take 1 tablet (20 mg total) by mouth daily as needed.     Review of Systems    Review of Systems  Constitution: Negative for chills, fever and malaise/fatigue.  Cardiovascular: Positive for dyspnea on exertion. Negative  for chest pain, leg swelling, near-syncope, orthopnea, palpitations and syncope.  Respiratory: Negative for cough, shortness of breath and wheezing.   Musculoskeletal: Positive for joint pain (knees).  Gastrointestinal: Negative for nausea and vomiting.  Neurological: Negative for dizziness, light-headedness and weakness.   All other systems reviewed and are otherwise negative except as noted above.  Physical Exam    VS:  BP 120/60 (BP Location: Left Arm, Patient Position: Sitting, Cuff Size: Normal)   Pulse 60   Temp (!) 97.1 F (36.2 C)   Ht 5' 8.5" (1.74 m)   Wt 175 lb (79.4 kg)   SpO2 97%   BMI 26.22 kg/m  , BMI Body mass index is 26.22 kg/m. GEN: Well nourished, well developed, in no acute distress. HEENT: normal. Neck: Supple, no JVD, carotid bruits, or masses. Cardiac: RRR, no murmurs, rubs, or gallops. No clubbing, cyanosis, edema.  Radials/DP/PT 2+ and equal bilaterally.  Respiratory:  Respirations regular and unlabored, clear to auscultation bilaterally. GI: Soft, nontender, nondistended,  BS + x 4. MS: No deformity or atrophy. Skin: Warm and dry, no rash. Neuro:  Strength and sensation are intact. Psych: Normal affect.  Accessory Clinical Findings    ECG personally reviewed by me today -sinus rhythm sinus rhythm rate 60, first-degree AV block and right bundle branch block-stable compared to previous- no acute changes.  Assessment & Plan    1. CAD - Stable with no anginal symptoms.  Continue GDMT with aspirin, Plavix, lisinopril, statin.  Continue low-sodium heart healthy diet.  Recommend increased cardiovascular exercise.  No indication for ischemic evaluation at this time.  2. PAD - No recurrent symptoms.  Does endorse some leg pain but it his knees and he attributes to arthritis.  3. HLD - Lab 07/29/18 total cholesterol 85, HDL 29.8, LDL33, triglycerides 107. LDL at goal of <70. Continue Atorvastatin, fish oil.  4. HTN -BP well controlled.  Continue present  antihypertensive regimen of lisinopril.  5. Chronic systolic heart failure due to mild ischemic cardiomyopathy - Echo 08/22/17 LVEF 45-50%, RV mildly dilated and mildly reduced systolic function.  Euvolemic on exam today.  He reports some dyspnea on exertion with more than usual activity over the last couple of months-on discussion he does not have a regular exercise regimen and and this is likely element of deconditioning-he is agreeable to start a walking regimen.  Continue carvedilol and lisinopril.  6. RBBB -known history.  Noted again today on EKG.  No dizziness, lightheadedness, near syncope.  Continue to monitor with annual EKG.  Disposition: Follow up in 6 month(s) with Dr. Kirke Corin or APP.    Alver Sorrow, NP 03/08/2019, 11:26 AM

## 2019-03-08 NOTE — Patient Instructions (Addendum)
Medication Instructions:  No medication changes today.   *If you need a refill on your cardiac medications before your next appointment, please call your pharmacy*  Lab Work: No lab work today.  If you have labs (blood work) drawn today and your tests are completely normal, you will receive your results only by: [name] MyChart Message (if you have MyChart) OR . A paper copy in the mail If you have any lab test that is abnormal or we need to change your treatment, we will call you to review the results.  Testing/Procedures: You had an EKG today.   Follow-Up: At [facility], you and your health needs are our priority.  As part of our continuing mission to provide you with exceptional heart care, we have created designated Provider Care Teams.  These Care Teams include your primary Cardiologist (physician) and Advanced Practice Providers (APPs -  Physician Assistants and Nurse Practitioners) who all work together to provide you with the care you need, when you need it.  Your next appointment:   6 month(s)  The format for your next appointment:   In Person  Provider:    You may see Franklin Sacramento, MD or one of the following Advanced Practice Providers on your designated Care Team:    Murray Hodgkins, NP  Christell Faith, PA-C  Marrianne Mood, PA-C   Other Instructions   Would recommend adding some regular walking to your regimen. Try walking a lap around [place] or [employer] after you complete your work. Then, gradually increase your number of laps. Regular cardiovascular exercise is an important part of heart health. It also may help your shortness of breath with activity.    Continue low sodium, heart healthy diet. Good work on your cholesterol numbers -  They look fantastic!!! Diet changes are hard, but you've done a fantastic job.    Your blood pressure was very good today!

## 2019-03-25 ENCOUNTER — Telehealth: Payer: Self-pay | Admitting: Internal Medicine

## 2019-03-25 NOTE — Telephone Encounter (Signed)
Pt called and left a voicemail requesting a call back with more information regarding the Covid vaccine. Pt is wondering if there is any type of exception for HR patients to get the vaccine now. Current people allowed to get the vaccine at this time are ages ranges 24yr and older.

## 2019-03-25 NOTE — Telephone Encounter (Signed)
Spoke to pt. Advised him that we have no control over who gets it when. I explained the Phases and that we are only in 1-B. So right now only Health Care [employer], People in [employer], and people over the age of 72 are getting it. Advised him to keep watch on the [facility], local HD, or [email or web address] for updates.

## 2019-05-14 ENCOUNTER — Ambulatory Visit: Payer: PPO | Attending: Internal Medicine

## 2019-05-14 DIAGNOSIS — Z23 Encounter for immunization: Secondary | ICD-10-CM

## 2019-05-14 NOTE — Progress Notes (Signed)
   Covid-19 Vaccination Clinic  Name:  Franklin Marks    MRN: [id number] DOB: [birth year]  05/14/2019  [name] was observed post Covid-19 immunization for 15 minutes without incidence. He was provided with Vaccine Information Sheet and instruction to access the V-Safe system.   [name] was instructed to call 911 with any severe reactions post vaccine: [name] Difficulty breathing  . Swelling of your face and throat  . A fast heartbeat  . A bad rash all over your body  . Dizziness and weakness    Immunizations Administered    Name Date Dose VIS Date Route   Pfizer COVID-19 Vaccine 05/14/2019 11:39 AM 0.3 mL 02/26/2019 Intramuscular   Manufacturer: [employer], [employer]   Lot: [id number]   NDC: [address]-2

## 2019-06-01 ENCOUNTER — Other Ambulatory Visit: Payer: Self-pay | Admitting: Internal Medicine

## 2019-06-01 NOTE — Telephone Encounter (Signed)
Last filled 03-05-19 #30 Last OV 07-22-18 Next OV 07-27-19 [facility]

## 2019-06-08 ENCOUNTER — Ambulatory Visit: Payer: PPO

## 2019-06-09 ENCOUNTER — Other Ambulatory Visit: Payer: Self-pay | Admitting: Cardiovascular Disease

## 2019-06-09 ENCOUNTER — Ambulatory Visit: Payer: PPO | Attending: Internal Medicine

## 2019-06-09 DIAGNOSIS — Z23 Encounter for immunization: Secondary | ICD-10-CM

## 2019-06-09 NOTE — Progress Notes (Signed)
   Covid-19 Vaccination Clinic  Name:  Franklin Marks    MRN: [id number] DOB: [birth year]  06/09/2019  [name] was observed post Covid-19 immunization for 15 minutes without incident. He was provided with Vaccine Information Sheet and instruction to access the V-Safe system.   Mr. Comp was instructed to call 911 with any severe reactions post vaccine: [name] Difficulty breathing  . Swelling of face and throat  . A fast heartbeat  . A bad rash all over body  . Dizziness and weakness   Immunizations Administered    Name Date Dose VIS Date Route   Pfizer COVID-19 Vaccine 06/09/2019  3:22 PM 0.3 mL 02/26/2019 Intramuscular   Manufacturer: [employer], [employer]   Lot: [id number]   NDC: [address]-0

## 2019-06-27 ENCOUNTER — Other Ambulatory Visit: Payer: Self-pay | Admitting: Cardiovascular Disease

## 2019-07-09 ENCOUNTER — Other Ambulatory Visit: Payer: Self-pay | Admitting: Internal Medicine

## 2019-07-21 ENCOUNTER — Other Ambulatory Visit: Payer: Self-pay | Admitting: Internal Medicine

## 2019-07-27 ENCOUNTER — Encounter: Payer: Self-pay | Admitting: Internal Medicine

## 2019-07-27 ENCOUNTER — Other Ambulatory Visit: Payer: Self-pay

## 2019-07-27 ENCOUNTER — Ambulatory Visit (INDEPENDENT_AMBULATORY_CARE_PROVIDER_SITE_OTHER): Payer: PPO | Admitting: Internal Medicine

## 2019-07-27 VITALS — BP 140/82 | HR 47 | Temp 96.7°F | Ht 67.5 in | Wt 176.0 lb

## 2019-07-27 DIAGNOSIS — I739 Peripheral vascular disease, unspecified: Secondary | ICD-10-CM

## 2019-07-27 DIAGNOSIS — I2510 Atherosclerotic heart disease of native coronary artery without angina pectoris: Secondary | ICD-10-CM

## 2019-07-27 DIAGNOSIS — J449 Chronic obstructive pulmonary disease, unspecified: Secondary | ICD-10-CM

## 2019-07-27 DIAGNOSIS — G2581 Restless legs syndrome: Secondary | ICD-10-CM | POA: Diagnosis not present

## 2019-07-27 DIAGNOSIS — I5022 Chronic systolic (congestive) heart failure: Secondary | ICD-10-CM | POA: Diagnosis not present

## 2019-07-27 DIAGNOSIS — Z7189 Other specified counseling: Secondary | ICD-10-CM | POA: Diagnosis not present

## 2019-07-27 DIAGNOSIS — Z0000 Encounter for general adult medical examination without abnormal findings: Secondary | ICD-10-CM

## 2019-07-27 DIAGNOSIS — Z125 Encounter for screening for malignant neoplasm of prostate: Secondary | ICD-10-CM

## 2019-07-27 LAB — COMPREHENSIVE METABOLIC PANEL
ALT: 14 U/L (ref 0–53)
AST: 13 U/L (ref 0–37)
Albumin: 4.2 g/dL (ref 3.5–5.2)
Alkaline Phosphatase: 119 U/L — ABNORMAL HIGH (ref 39–117)
BUN: 14 mg/dL (ref 6–23)
CO2: 28 mEq/L (ref 19–32)
Calcium: 9 mg/dL (ref 8.4–10.5)
Chloride: 105 mEq/L (ref 96–112)
Creatinine, Ser: 0.85 mg/dL (ref 0.40–1.50)
GFR: 89.51 mL/min (ref >60.00)
Glucose, Bld: 96 mg/dL (ref 70–99)
Potassium: 4.1 mEq/L (ref 3.5–5.1)
Sodium: 138 mEq/L (ref 135–145)
Total Bilirubin: 0.5 mg/dL (ref 0.2–1.2)
Total Protein: 6.9 g/dL (ref 6.0–8.3)

## 2019-07-27 LAB — CBC
HCT: 39.4 % (ref 39.0–52.0)
Hemoglobin: 13.4 g/dL (ref 13.0–17.0)
MCHC: 34.1 g/dL (ref 30.0–36.0)
MCV: 92 fl (ref 78.0–100.0)
Platelets: 153 10*3/uL (ref 150.0–400.0)
RBC: 4.28 Mil/uL (ref 4.22–5.81)
RDW: 13.8 % (ref 11.5–15.5)
WBC: 7.2 10*3/uL (ref 4.0–10.5)

## 2019-07-27 LAB — LIPID PANEL
Cholesterol: 81 mg/dL (ref 0–200)
HDL: 28.9 mg/dL — ABNORMAL LOW (ref >39.00)
LDL Cholesterol: 29 mg/dL (ref 0–99)
NonHDL: 52.01
Total CHOL/HDL Ratio: 3
Triglycerides: 117 mg/dL (ref 0.0–149.0)
VLDL: 23.4 mg/dL (ref 0.0–40.0)

## 2019-07-27 LAB — PSA, MEDICARE: PSA: 0.65 ng/ml (ref 0.10–4.00)

## 2019-07-27 NOTE — Assessment & Plan Note (Signed)
Mildly reduced EF No clinical symptoms or problems now On carvedilol, lisinopril, ASA, plavix

## 2019-07-27 NOTE — Assessment & Plan Note (Signed)
No claudication since procedure On ASA, plavix, statin

## 2019-07-27 NOTE — Assessment & Plan Note (Signed)
I have personally reviewed the Medicare Annual Wellness questionnaire and have noted 1. The patient's medical and social history 2. Their use of alcohol, tobacco or illicit drugs 3. Their current medications and supplements 4. The patient's functional ability including ADL's, fall risks, home safety risks and hearing or visual             impairment. 5. Diet and physical activities 6. Evidence for depression or mood disorders  The patients weight, height, BMI and visual acuity have been recorded in the chart I have made referrals, counseling and provided education to the patient based review of the above and I have provided the pt with a written personalized care plan for preventive services.  I have provided you with a copy of your personalized plan for preventive services. Please take the time to review along with your updated medication list.  Yearly flu vaccine Prefers no shingrix Just handed in FIT Discussed PSA---will check at least once Discussed fitness Lung cancer screening

## 2019-07-27 NOTE — Assessment & Plan Note (Signed)
Doing well without Rx since stopping smoking

## 2019-07-27 NOTE — Assessment & Plan Note (Signed)
See social history 

## 2019-07-27 NOTE — Assessment & Plan Note (Signed)
No angina On appropriate Rx

## 2019-07-27 NOTE — Assessment & Plan Note (Signed)
Clonazepam rarely

## 2019-07-27 NOTE — Progress Notes (Signed)
Subjective:    Patient ID: Franklin Marks, male    DOB: November 01, 1950, 69 y.o.   MRN: [id number]  HPI Here for Medicare wellness visit and follow up of chronic health conditions This visit occurred during the SARS-CoV-2 public health emergency.  Safety protocols were in place, including screening questions prior to the visit, additional usage of staff PPE, and extensive cleaning of exam room while observing appropriate contact time as indicated for disinfecting solutions.   Reviewed form and advanced directives Reviewed other doctors No tobacco for some years Rare beer Vision is fine Hearing is good No set exercise but does yard work and some walking No falls No depression or anhedonia Independent with instrumental ADLs No memory issues  No apparent heart problems No chest pain Did have constant cough and easy dyspnea after first COVID vaccine This did resolve Only occasional cough since stopping smoking Breathing usually okay No palpitations No dizziness or syncope No edema Sleeps flat and no PND  Takes PPI prn No regular heartburn No dysphagia  No recent leg pain Past stent some time ago in legs  Current Outpatient Medications on File Prior to Visit  Medication Sig Dispense Refill  . aspirin 81 MG chewable tablet Chew 1 tablet (81 mg total) by mouth daily.    [name] atorvastatin (LIPITOR) 80 MG tablet TAKE ONE (1) TABLET BY MOUTH EACH EVENING AT 6PM 90 tablet 0  . carvedilol (COREG) 6.25 MG tablet TAKE ONE-HALF (1/2) OF A TABLET BY MOUTH TWICE DAILY WITH A MEAL. 90 tablet 3  . clonazePAM (KLONOPIN) 0.5 MG tablet TAKE 1 TABLET (0.5 MG TOTAL) BY MOUTH AT BEDTIME AS NEEDED. 30 tablet 0  . clopidogrel (PLAVIX) 75 MG tablet TAKE ONE TABLET BY MOUTH EACH MORNING WITH BREAKFAST 90 tablet 0  . lisinopril (ZESTRIL) 5 MG tablet TAKE 1/2 TABLET BY MOUTH EVERY DAY 45 tablet 3  . Omega-3 Fatty Acids (FISH OIL) 1000 MG CAPS Take 2 capsules by mouth daily.    . pantoprazole (PROTONIX) 20  MG tablet TAKE 1 TABLET BY MOUTH EVERY DAY AS NEEDED 90 tablet 0   No current facility-administered medications on file prior to visit.    Allergies  Allergen Reactions  . Codeine Nausea And Vomiting    Past Medical History:  Diagnosis Date  . Anemia   . Atrial fibrillation ([place]) 01/13/2012  . CAD (coronary artery disease)    s/p CABG 2001  . COPD (chronic obstructive pulmonary disease) ([place])    ongoing tobacco use  . GERD (gastroesophageal reflux disease)   . Ischemic cardiomyopathy 01/10/2012   LVEF 35-40%, mild conc hypertrophy, servere HK of anteroseptum, borderline RVH  . NSTEMI (non-ST elevated myocardial infarction) ([place]) 01/10/2012   Associated with ventricular fibrillation    Past Surgical History:  Procedure Laterality Date  . CARDIAC CATHETERIZATION  2011   At [facility]  . CARDIAC CATHETERIZATION  12/2011   prox LAD occlusion, ostial RCA occlusion, 30% prox LCx stenosis, LIMA-LAD: 80% post-anastamosis lesion, SVG-Dx2: old occlusion w/ thrombus (noted on prior 2011 cath at [facility]), SVG-Dx: patent, SVG-RCA: patent, diffuse irregs, 80-90% PDA/PLA lesions patent  . CARDIAC CATHETERIZATION N/A 03/15/2015   Procedure: Left Heart Cath and Cors/Grafts Angiography;  Surgeon: Burnell Blanks, MD;  Location: [place] CV LAB;  Service: Cardiovascular;  Laterality: N/A;  . CARDIAC CATHETERIZATION N/A 03/15/2015   Procedure: Coronary Stent Intervention;  Surgeon: Burnell Blanks, MD;  Location: [place] CV LAB;  Service: Cardiovascular;  Laterality: N/A;  svg to  diagnoal 2  . CORONARY ARTERY BYPASS GRAFT  2001  . LEFT HEART CATHETERIZATION WITH CORONARY ANGIOGRAM N/A 01/10/2012   Procedure: LEFT HEART CATHETERIZATION WITH CORONARY ANGIOGRAM;  Surgeon: Lennette Bihari, MD;  Location: [facility] CATH LAB;  Service: Cardiovascular;  Laterality: N/A;    Family History  Problem Relation Age of Onset  . Hyperlipidemia Mother   . Alcohol abuse Father   . Hyperlipidemia  Father   . Heart disease Sister   . Hyperlipidemia Sister   . Heart disease Brother   . Hyperlipidemia Brother   . Hyperlipidemia Sister   . Diabetes Neg Hx   . Hypertension Neg Hx     Social History   Socioeconomic History  . Marital status: Married    Spouse name: Not on file  . Number of children: 4  . Years of education: Not on file  . Highest education level: Not on file  Occupational History  . Occupation: Management --now disabled  . 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 Smoking status: Former Smoker    Packs/day: 1.00    Years: 45.00    Pack years: 45.00    Types: Cigarettes    Quit date: 03/13/2015    Years since quitting: 4.3  . Smokeless tobacco: Never Used  Substance and Sexual Activity  . Alcohol use: Yes    Alcohol/week: 0.0 standard drinks    Comment: really rare beer  . Drug use: Yes    Types: Marijuana  . Sexual activity: Not Currently  Other Topics Concern  . Not on file  Social History Narrative   Married twice   3 children from 1st marriage-- 1 from second   Worked as Investment banker, operational      Has living will   Wife has health care POA---alternate would be daughter Judeth Cornfield   Would accept resuscitation--- but no prolonged ventilation   No tube feeds if cognitively unaware   Social Determinants of Health   Financial Resource Strain:   . Difficulty of Paying Living Expenses:   Food Insecurity:   . Worried About Programme researcher, broadcasting/film/video in the Last Year:   . Barista in the Last Year:   Transportation Needs:   . Freight forwarder (Medical):   [name] Lack of Transportation (Non-Medical):   Physical Activity:   . Days of Exercise per Week:   . Minutes of Exercise per Session:   Stress:   . Feeling of Stress :   Social Connections:   . Frequency of Communication with Friends and Family:   . Frequency of Social Gatherings with Friends and Family:   . Attends Religious Services:    . Active Member of Clubs or Organizations:   . Attends Banker Meetings:   [name] Marital Status:   Intimate Partner Violence:   . Fear of Current or Ex-Partner:   . Emotionally Abused:   [name] Physically Abused:   . Sexually Abused:    Review of Systems Appetite is good Weight stable Sleep okay-- nocturia x 1-2 Uses the clonazepam very rarely (no regular RLS symptoms) Urine flow is okay Bowels are fine--no blood. No sig back or joint pains No skin rash or suspicious lesions    Objective:   Physical Exam  Constitutional: He is oriented to person, place, and time. He appears well-developed. No distress.  HENT:  Full upper-- rarely wears the lower  Neck: No thyromegaly present.  Cardiovascular: Normal rate, regular rhythm and normal heart sounds. Exam reveals no gallop.  No murmur heard. Feet slightly cool--pulses not really palpable  Respiratory: Effort normal. No respiratory distress. He has no wheezes. He has no rales.  Decreased breath sounds but clear  GI: Soft. There is no abdominal tenderness.  Musculoskeletal:        General: No tenderness or edema.  Lymphadenopathy:    He has no cervical adenopathy.  Neurological: He is alert and oriented to person, place, and time.  President---"Biden, Trump, Bush---Obama" [id number] D-[id number] Recall 3/3  Skin: No rash noted. No erythema.  Psychiatric: He has a normal mood and affect. His behavior is normal.           Assessment & Plan:

## 2019-07-28 ENCOUNTER — Telehealth: Payer: Self-pay | Admitting: *Deleted

## 2019-07-28 DIAGNOSIS — Z122 Encounter for screening for malignant neoplasm of respiratory organs: Secondary | ICD-10-CM

## 2019-07-28 DIAGNOSIS — Z87891 Personal history of nicotine dependence: Secondary | ICD-10-CM

## 2019-07-28 NOTE — Telephone Encounter (Signed)
Received referral for initial lung cancer screening scan. Contacted patient and obtained smoking history,(former, quit 03/13/15, 45 pack year) as well as answering questions related to screening process. Patient denies signs of lung cancer such as weight loss or hemoptysis. Patient denies comorbidity that would prevent curative treatment if lung cancer were found. Patient is scheduled for shared decision making visit and CT scan on 08/03/19 at 2pm.

## 2019-08-03 ENCOUNTER — Ambulatory Visit
Admission: RE | Admit: 2019-08-03 | Discharge: 2019-08-03 | Disposition: A | Discharge status: Home / Self Care | Payer: PPO | Source: Ambulatory Visit | Attending: Oncology | Admitting: Oncology

## 2019-08-03 ENCOUNTER — Other Ambulatory Visit: Payer: Self-pay

## 2019-08-03 ENCOUNTER — Inpatient Hospital Stay: Payer: PPO | Attending: Oncology | Admitting: Hospice and Palliative Medicine

## 2019-08-03 DIAGNOSIS — Z87891 Personal history of nicotine dependence: Secondary | ICD-10-CM

## 2019-08-03 DIAGNOSIS — Z122 Encounter for screening for malignant neoplasm of respiratory organs: Secondary | ICD-10-CM | POA: Insufficient documentation

## 2019-08-03 NOTE — Progress Notes (Signed)
In accordance with CMS guidelines, patient has met eligibility criteria including age, absence of signs or symptoms of lung cancer.  Social History   Tobacco Use  . Smoking status: Former Smoker    Packs/day: 1.00    Years: 45.00    Pack years: 45.00    Types: Cigarettes    Quit date: 03/13/2015    Years since quitting: 4.3  . Smokeless tobacco: Never Used  Substance Use Topics  . Alcohol use: Yes    Alcohol/week: 0.0 standard drinks    Comment: really rare beer  . Drug use: Yes    Types: Marijuana      A shared decision-making session was conducted prior to the performance of CT scan. This includes one or more decision aids, includes benefits and harms of screening, follow-up diagnostic testing, over-diagnosis, false positive rate, and total radiation exposure.   Counseling on the importance of adherence to annual lung cancer LDCT screening, impact of co-morbidities, and ability or willingness to undergo diagnosis and treatment is imperative for compliance of the program.   Counseling on the importance of continued smoking cessation for former smokers; the importance of smoking cessation for current smokers, and information about tobacco cessation interventions have been given to patient including [place] and [facility] quit [place] programs.   Written order for lung cancer screening with LDCT has been given to the patient and any and all questions have been answered to the best of my abilities.    Yearly follow up will be coordinated by Burgess Estelle, Thoracic Navigator.  Time Total: 15 minutes  Visit consisted of counseling and education dealing with complex health screening. Greater than 50%  of this time was spent counseling and coordinating care related to the above assessment and plan.  Signed by: Altha Harm, PhD, NP-C [phone] (Work Cell)

## 2019-08-06 ENCOUNTER — Encounter: Payer: Self-pay | Admitting: *Deleted

## 2019-08-16 ENCOUNTER — Other Ambulatory Visit: Payer: Self-pay | Admitting: Cardiovascular Disease

## 2019-08-16 ENCOUNTER — Other Ambulatory Visit: Payer: Self-pay | Admitting: Internal Medicine

## 2019-08-17 ENCOUNTER — Other Ambulatory Visit: Payer: Self-pay

## 2019-08-17 MED ORDER — ATORVASTATIN CALCIUM 80 MG PO TABS: ORAL_TABLET | ORAL | 0 refills | Status: DC

## 2019-08-18 ENCOUNTER — Other Ambulatory Visit: Payer: Self-pay | Admitting: Internal Medicine

## 2019-08-18 NOTE — Telephone Encounter (Signed)
Last filled 06-02-19 #30 Last OV 07-27-19 Next OV 07-28-20 [facility]

## 2019-08-19 ENCOUNTER — Other Ambulatory Visit: Payer: Self-pay | Admitting: Cardiovascular Disease

## 2019-09-21 NOTE — Progress Notes (Signed)
Office Visit    Patient Name: Franklin Marks Date of Encounter: 09/22/2019  Primary Care Provider:  [name], MD Primary Cardiologist:  Lorine Bears, MD Electrophysiologist:  None   Chief Complaint    Franklin Marks is a 69 y.o. male with a hx of PAD, CAD s/p CABG, chronic systolic heart failure due to ICM, HTN, HLD presents today for 34-month follow-up of CAD and heart failure.  Past Medical History    Past Medical History:  Diagnosis Date  . Anemia   . Atrial fibrillation (HCC) 01/13/2012  . CAD (coronary artery disease)    s/p CABG 2001  . COPD (chronic obstructive pulmonary disease) (HCC)    ongoing tobacco use  . GERD (gastroesophageal reflux disease)   . Ischemic cardiomyopathy 01/10/2012   LVEF 35-40%, mild conc hypertrophy, servere HK of anteroseptum, borderline RVH  . NSTEMI (non-ST elevated myocardial infarction) (HCC) 01/10/2012   Associated with ventricular fibrillation   Past Surgical History:  Procedure Laterality Date  . CARDIAC CATHETERIZATION  2011   At [facility]  . CARDIAC CATHETERIZATION  12/2011   prox LAD occlusion, ostial RCA occlusion, 30% prox LCx stenosis, LIMA-LAD: 80% post-anastamosis lesion, SVG-Dx2: old occlusion w/ thrombus (noted on prior 2011 cath at [facility]), SVG-Dx: patent, SVG-RCA: patent, diffuse irregs, 80-90% PDA/PLA lesions patent  . CARDIAC CATHETERIZATION N/A 03/15/2015   Procedure: Left Heart Cath and Cors/Grafts Angiography;  Surgeon: Kathleene Hazel, MD;  Location: [facility] INVASIVE CV LAB;  Service: Cardiovascular;  Laterality: N/A;  . CARDIAC CATHETERIZATION N/A 03/15/2015   Procedure: Coronary Stent Intervention;  Surgeon: Kathleene Hazel, MD;  Location: [facility] INVASIVE CV LAB;  Service: Cardiovascular;  Laterality: N/A;  svg to diagnoal 2  . CORONARY ARTERY BYPASS GRAFT  2001  . LEFT HEART CATHETERIZATION WITH CORONARY ANGIOGRAM N/A 01/10/2012   Procedure: LEFT HEART CATHETERIZATION WITH CORONARY ANGIOGRAM;  Surgeon:  Lennette Bihari, MD;  Location: [facility] CATH LAB;  Service: Cardiovascular;  Laterality: N/A;    Allergies  Allergies  Allergen Reactions  . Codeine Nausea And Vomiting    History of Present Illness    Franklin Marks is a 69 y.o. male with a hx of PAD, CAD s/p CABG, chronic systolic heart failure due to ICM, HTN, HLD last seen 08/25/2018 by Dr. Kirke Corin.  He presents today for 30-month follow-up of his CAD and heart failure. He was last seen in clinic 02/2019.   Known history of cardiac arrest in the setting of CAD and chest pain.  S/p CABG in 2001.  NSTEMI 02/2015 with PCI/DES to SVG to second diagonal.  EF 35 to 45%  Peripheral arterial disease known mildly reduced ABI bilaterally and chronic total occlusion of SFA bilaterally with reconstitution distally via collaterals.  Echo 08/2017 EF 45 to 50% with mild tricuspid regurgitation.  No chest pain, pressures, rightness. Notices some dyspnea with more than usual activity such as walking up a hill. This has been progressing a little. No orthopnea, PND. Up ten pounds compared to office visit 6 months ago. He attributes this to lack of activity and walking less.  BP at home checked with watch from 100-115/60-70. HR at home 55-58 bpm.   He denies symptoms of claudication.  He reports no lightheadedness, dizziness, near syncope.  He reports no shortness of breath at rest.  He denies chest pain, pressure, tightness.  EKGs/Labs/Other Studies Reviewed:   The following studies were reviewed today:  Echo 08/22/17 - Left ventricle: The cavity size was normal.  Wall thickness was   normal. Systolic function was mildly reduced. The estimated   ejection fraction was in the range of 45% to 50%. Diffuse   hypokinesis. The study is not technically sufficient to allow   evaluation of LV diastolic function. - Left atrium: The atrium was normal in size. - Right ventricle: The cavity size was mildly dilated. Mildly   reduced systolic function. - Right atrium: The  atrium was mildly dilated. - Tricuspid valve: There was mild regurgitation. - Pulmonary arteries: PA peak pressure: 34 mm Hg (S). - Inferior vena cava: The vessel was normal in size. The   respirophasic diameter changes were in the normal range (>= 50%),   consistent with normal central venous pressure.  EKG:  EKG is ordered today.  The ekg ordered today demonstrates sinus bradycardia 52 bpm with first-degree AV block (PR 290) and RBBB with no acute ST/T wave changes  Recent Labs: 07/27/2019: ALT 14; BUN 14; Creatinine, Ser 0.85; Hemoglobin 13.4; Platelets 153.0; Potassium 4.1; Sodium 138  Recent Lipid Panel    Component Value Date/Time   CHOL 81 07/27/2019 0909   TRIG 117.0 07/27/2019 0909   HDL 28.90 (L) 07/27/2019 0909   CHOLHDL 3 07/27/2019 0909   VLDL 23.4 07/27/2019 0909   LDLCALC 29 07/27/2019 0909   LDLDIRECT 55.0 07/14/2017 1552    Home Medications   Current Meds  Medication Sig  . aspirin 81 MG chewable tablet Chew 1 tablet (81 mg total) by mouth daily.  [name] atorvastatin (LIPITOR) 80 MG tablet TAKE ONE (1) TABLET BY MOUTH EACH EVENING AT 6PM  . carvedilol (COREG) 6.25 MG tablet TAKE ONE-HALF (1/2) OF A TABLET BY MOUTH TWICE DAILY WITH A MEAL.  . clonazePAM (KLONOPIN) 0.5 MG tablet TAKE 1 TABLET BY MOUTH AT BEDTIME AS NEEDED  . clopidogrel (PLAVIX) 75 MG tablet TAKE ONE TABLET BY MOUTH EACH MORNING WITH BREAKFAST  . lisinopril (ZESTRIL) 5 MG tablet TAKE 1/2 TABLET BY MOUTH EVERY DAY  . Omega-3 Fatty Acids (FISH OIL) 1000 MG CAPS Take 2 capsules by mouth daily.  . pantoprazole (PROTONIX) 20 MG tablet TAKE 1 TABLET BY MOUTH EVERY DAY AS NEEDED     Review of Systems   Review of Systems  Constitutional: Negative for chills, fever and malaise/fatigue.  Cardiovascular: Positive for dyspnea on exertion. Negative for chest pain, leg swelling, near-syncope, orthopnea, palpitations and syncope.  Respiratory: Negative for cough, shortness of breath and wheezing.     Gastrointestinal: Negative for nausea and vomiting.  Neurological: Negative for dizziness, light-headedness and weakness.   All other systems reviewed and are otherwise negative except as noted above.  Physical Exam    VS:  BP 100/70 (BP Location: Left Arm, Patient Position: Sitting, Cuff Size: Normal)   Pulse (!) 52   Ht 5' 8.5" (1.74 m)   Wt 185 lb 6 oz (84.1 kg)   SpO2 97%   BMI 27.78 kg/m  , BMI Body mass index is 27.78 kg/m. GEN: Well nourished, well developed, in no acute distress. HEENT: normal. Neck: Supple, no JVD, carotid bruits, or masses. Cardiac: RRR, no murmurs, rubs, or gallops. No clubbing, cyanosis, edema.  Radials/DP/PT 2+ and equal bilaterally.  Respiratory:  Respirations regular and unlabored, clear to auscultation bilaterally. GI: Soft, nontender, nondistended, BS + x 4. MS: No deformity or atrophy. Skin: Warm and dry, no rash. Neuro:  Strength and sensation are intact. Psych: Normal affect.  Assessment & Plan    1. DOE -likely multifactorial deconditioning, up 10  pounds, ICM, CAD, COPD.  Due to worsening dyspnea with exertion over the last 3 months we will plan to update echocardiogram.  We will stop his carvedilol as he has a bradycardic heart rate and low blood pressure and this may be contributory to fatigue, dyspnea, exercise intolerance.[name]  He was encouraged to start a walking regimen.  2. CAD - Continue GDMT with aspirin, Plavix, lisinopril, statin.  Continue low-sodium heart healthy diet.  Recommend increased cardiovascular exercise.  Does endorse some DOE but no chest pain, pressure, tightness.  EKG today sinus bradycardia with no acute ST/T wave changes.  Plan for DOE evaluation, as above.  No indication for ischemic evaluation at this time though can be considered if above work-up is unrevealing.  3. PAD -no symptoms or worsening claudication.  Continue aspirin, Plavix, statin.  4. HLD - Lab 07/27/19 LDL 29. LDL at goal of <70. Continue Atorvastatin,  fish oil.  5. HTN -BP low normal.  He reports low blood pressure readings at home associated with some fatigue, exercise intolerance.  We will stop carvedilol, as above.  Continue lisinopril 2.5 mg daily.  6. Chronic systolic heart failure due to mild ischemic cardiomyopathy - Echo 08/22/17 LVEF 45-50%, RV mildly dilated and mildly reduced systolic function.  Euvolemic on exam today.  Note some progressive DOE.  Plan for echocardiogram.  Carvedilol stopped, as above.  Continue lisinopril.  No indication for loop diuretic at this time.  7. RBBB -known history.  Noted again today on EKG.  No dizziness, lightheadedness, near syncope.  Continue to monitor with annual EKG.  Disposition: Start carvedilol.  Echocardiogram.  Follow up after echocardiogram with Dr. Kirke Corin or APP.    Alver Sorrow, NP 09/22/2019, 9:33 AM

## 2019-09-22 ENCOUNTER — Ambulatory Visit: Payer: PPO | Admitting: Family

## 2019-09-22 ENCOUNTER — Encounter: Payer: Self-pay | Admitting: Family

## 2019-09-22 ENCOUNTER — Other Ambulatory Visit: Payer: Self-pay

## 2019-09-22 VITALS — BP 100/70 | HR 52 | Ht 68.5 in | Wt 185.4 lb

## 2019-09-22 DIAGNOSIS — E785 Hyperlipidemia, unspecified: Secondary | ICD-10-CM

## 2019-09-22 DIAGNOSIS — R0600 Dyspnea, unspecified: Secondary | ICD-10-CM

## 2019-09-22 DIAGNOSIS — I5030 Unspecified diastolic (congestive) heart failure: Secondary | ICD-10-CM

## 2019-09-22 DIAGNOSIS — I10 Essential (primary) hypertension: Secondary | ICD-10-CM | POA: Diagnosis not present

## 2019-09-22 DIAGNOSIS — I5022 Chronic systolic (congestive) heart failure: Secondary | ICD-10-CM | POA: Diagnosis not present

## 2019-09-22 DIAGNOSIS — I25118 Atherosclerotic heart disease of native coronary artery with other forms of angina pectoris: Secondary | ICD-10-CM | POA: Diagnosis not present

## 2019-09-22 NOTE — Patient Instructions (Signed)
Medication Instructions:  Your physician has recommended you make the following change in your medication:   STOP Carvedilol  CONTINUE Lisinopril half tablet (2.5mg ) once daily  *If you need a refill on your cardiac medications before your next appointment, please call your pharmacy*  Lab Work: No lab work today.   Your cholesterol, kidney, and liver numbers in May looked fantastic!!  Testing/Procedures: Your EKG today shows sinus bradycardia. It shows a stable right bundle branch block.   Your physician has requested that you have an echocardiogram. Echocardiography is a painless test that uses sound waves to create images of your heart. It provides your doctor with information about the size and shape of your heart and how well your heart's chambers and valves are working. This procedure takes approximately one hour. There are no restrictions for this procedure.  Follow-Up: At [facility], you and your health needs are our priority.  As part of our continuing mission to provide you with exceptional heart care, we have created designated Provider Care Teams.  These Care Teams include your primary Cardiologist (physician) and Advanced Practice Providers (APPs -  Physician Assistants and Nurse Practitioners) who all work together to provide you with the care you need, when you need it.  We recommend signing up for the patient portal called "MyChart".  Sign up information is provided on this After Visit Summary.  MyChart is used to connect with patients for Virtual Visits (Telemedicine).  Patients are able to view lab/test results, encounter notes, upcoming appointments, etc.  Non-urgent messages can be sent to your provider as well.   To learn more about what you can do with MyChart, go to [email or web address].    Your next appointment:   After echocardiogram with Dr. Kirke Corin or Advanced Practice Provider

## 2019-10-22 ENCOUNTER — Other Ambulatory Visit: Payer: Self-pay

## 2019-10-22 ENCOUNTER — Ambulatory Visit (INDEPENDENT_AMBULATORY_CARE_PROVIDER_SITE_OTHER): Payer: PPO

## 2019-10-22 DIAGNOSIS — I5030 Unspecified diastolic (congestive) heart failure: Secondary | ICD-10-CM

## 2019-10-22 DIAGNOSIS — R0600 Dyspnea, unspecified: Secondary | ICD-10-CM

## 2019-10-22 LAB — ECHOCARDIOGRAM COMPLETE
AR max vel: 2.87 cm2
AV Area VTI: 2.94 cm2
AV Area mean vel: 2.84 cm2
AV Mean grad: 5 mmHg
AV Peak grad: 10.1 mmHg
Ao pk vel: 1.59 m/s
Area-P 1/2: 2.67 cm2
S' Lateral: 3.95 cm
Single Plane A4C EF: 44.7 %

## 2019-10-25 ENCOUNTER — Ambulatory Visit (INDEPENDENT_AMBULATORY_CARE_PROVIDER_SITE_OTHER): Payer: PPO | Admitting: Family

## 2019-10-25 ENCOUNTER — Other Ambulatory Visit: Payer: Self-pay

## 2019-10-25 ENCOUNTER — Encounter: Payer: Self-pay | Admitting: Family

## 2019-10-25 VITALS — BP 130/70 | HR 46 | Ht 68.5 in | Wt 173.4 lb

## 2019-10-25 DIAGNOSIS — I25118 Atherosclerotic heart disease of native coronary artery with other forms of angina pectoris: Secondary | ICD-10-CM | POA: Diagnosis not present

## 2019-10-25 DIAGNOSIS — E782 Mixed hyperlipidemia: Secondary | ICD-10-CM | POA: Diagnosis not present

## 2019-10-25 DIAGNOSIS — I255 Ischemic cardiomyopathy: Secondary | ICD-10-CM | POA: Diagnosis not present

## 2019-10-25 DIAGNOSIS — R0600 Dyspnea, unspecified: Secondary | ICD-10-CM

## 2019-10-25 DIAGNOSIS — I10 Essential (primary) hypertension: Secondary | ICD-10-CM

## 2019-10-25 DIAGNOSIS — I4510 Unspecified right bundle-branch block: Secondary | ICD-10-CM

## 2019-10-25 DIAGNOSIS — R0609 Other forms of dyspnea: Secondary | ICD-10-CM

## 2019-10-25 NOTE — Progress Notes (Signed)
Office Visit    Patient Name: Franklin Marks Date of Encounter: 10/25/2019  Primary Care Provider:  [name], MD Primary Cardiologist:  Lorine Bears, MD Electrophysiologist:  None   Chief Complaint    Franklin Marks is a 69 y.o. male with a hx of PAD, CAD s/p CABG, chronic systolic heart failure due to ICM, HTN, HLD presents today for follow-up after echocardiogram  Past Medical History    Past Medical History:  Diagnosis Date  . Anemia   . Atrial fibrillation (HCC) 01/13/2012  . CAD (coronary artery disease)    s/p CABG 2001  . COPD (chronic obstructive pulmonary disease) (HCC)    ongoing tobacco use  . GERD (gastroesophageal reflux disease)   . Ischemic cardiomyopathy 01/10/2012   LVEF 35-40%, mild conc hypertrophy, servere HK of anteroseptum, borderline RVH  . NSTEMI (non-ST elevated myocardial infarction) (HCC) 01/10/2012   Associated with ventricular fibrillation   Past Surgical History:  Procedure Laterality Date  . CARDIAC CATHETERIZATION  2011   At [facility]  . CARDIAC CATHETERIZATION  12/2011   prox LAD occlusion, ostial RCA occlusion, 30% prox LCx stenosis, LIMA-LAD: 80% post-anastamosis lesion, SVG-Dx2: old occlusion w/ thrombus (noted on prior 2011 cath at [facility]), SVG-Dx: patent, SVG-RCA: patent, diffuse irregs, 80-90% PDA/PLA lesions patent  . CARDIAC CATHETERIZATION N/A 03/15/2015   Procedure: Left Heart Cath and Cors/Grafts Angiography;  Surgeon: Kathleene Hazel, MD;  Location: [facility] INVASIVE CV LAB;  Service: Cardiovascular;  Laterality: N/A;  . CARDIAC CATHETERIZATION N/A 03/15/2015   Procedure: Coronary Stent Intervention;  Surgeon: Kathleene Hazel, MD;  Location: [facility] INVASIVE CV LAB;  Service: Cardiovascular;  Laterality: N/A;  svg to diagnoal 2  . CORONARY ARTERY BYPASS GRAFT  2001  . LEFT HEART CATHETERIZATION WITH CORONARY ANGIOGRAM N/A 01/10/2012   Procedure: LEFT HEART CATHETERIZATION WITH CORONARY ANGIOGRAM;  Surgeon: Lennette Bihari, MD;  Location[facility] C[facility] LAB;  Service: Cardiovascular;  Laterality: N/A;    Allergies  Allergies  Allergen Reactions  . Codeine Nausea And Vomiting    History of Present Illness    Franklin Marks is a 69 y.o. male with a hx of PAD, CAD s/p CABG, chronic systolic heart failure due to ICM, HTN, HLD last seen 08/25/2018 by Dr. Kirke Corin.  He presents today for 39-month follow-up of his CAD and heart failure. He was last seen in clinic 09/22/19.  Known history of cardiac arrest in the setting of CAD and chest pain.  S/p CABG in 2001.  NSTEMI 02/2015 with PCI/DES to SVG to second diagonal.  EF 35 to 45%  Peripheral arterial disease known mildly reduced ABI bilaterally and chronic total occlusion of SFA bilaterally with reconstitution distally via collaterals.  Echo 08/2017 EF 45 to 50% with mild tricuspid regurgitation.  Seen in clinic 09/22/2019.  He did noticed more dyspnea with exertion which was progressive.  No orthopnea, PND.  He was also noted to be up 10 pounds from his office visit 6 months prior.  He was recommended for echocardiogram.  His Coreg was discontinued due to bradycardia to assess at that was contributory to fatigue.  Subsequent echocardiogram 10/22/2019 with low normal LVEF 50-55%, no RWMA, RV normal size and function, midlly elevated PASP, no significant valvular abnormalities.   Reports a little more energy since stopping the Carvedilol. Reports getting heart rates 50s-60s at home which is improved compared to previous.  Feel that his dyspnea on exertion is improved.  Reports no lightheadedness, dizziness, near-syncope, syncope.  Reports no chest pain, pressure or tightness.   He and his wife have planned a trip to the [place] to [place] in [place] to visit his old [employer]	[place] buddies and he is very excited.  EKGs/Labs/Other Studies Reviewed:   The following studies were reviewed today:  Echo 10/22/19  1. Left ventricular ejection fraction, by estimation, is 50 to 55%.  The  left ventricle has low normal function. The left ventricle has no regional  wall motion abnormalities. Left ventricular diastolic parameters were  normal.   2. Right ventricular systolic function is low normal. The right  ventricular size is normal. There is mildly elevated pulmonary artery  systolic pressure.   3. The mitral valve is normal in structure. No evidence of mitral valve  regurgitation.   4. The aortic valve is tricuspid. Aortic valve regurgitation is not  visualized.   5. The inferior vena cava is normal in size with <50% respiratory  variability, suggesting right atrial pressure of 8 mmHg.   Echo 08/22/17 - Left ventricle: The cavity size was normal. Wall thickness was   normal. Systolic function was mildly reduced. The estimated   ejection fraction was in the range of 45% to 50%. Diffuse   hypokinesis. The study is not technically sufficient to allow   evaluation of LV diastolic function. - Left atrium: The atrium was normal in size. - Right ventricle: The cavity size was mildly dilated. Mildly   reduced systolic function. - Right atrium: The atrium was mildly dilated. - Tricuspid valve: There was mild regurgitation. - Pulmonary arteries: PA peak pressure: 34 mm Hg (S). - Inferior vena cava: The vessel was normal in size. The   respirophasic diameter changes were in the normal range (>= 50%),   consistent with normal central venous pressure.  EKG: No EKG today.  EKG independently reviewed from 09/22/2019 demonstrates sinus bradycardia 52 bpm with first-degree AV block (PR 290) and RBBB with no acute ST/T wave changes  Recent Labs: 07/27/2019: ALT 14; BUN 14; Creatinine, Ser 0.85; Hemoglobin 13.4; Platelets 153.0; Potassium 4.1; Sodium 138  Recent Lipid Panel    Component Value Date/Time   CHOL 81 07/27/2019 0909   TRIG 117.0 07/27/2019 0909   HDL 28.90 (L) 07/27/2019 0909   CHOLHDL 3 07/27/2019 0909   VLDL 23.4 07/27/2019 0909   LDLCALC 29 07/27/2019 0909    LDLDIRECT 55.0 07/14/2017 1552    Home Medications   Current Meds  Medication Sig  . aspirin 81 MG chewable tablet Chew 1 tablet (81 mg total) by mouth daily.  [name] atorvastatin (LIPITOR) 80 MG tablet TAKE ONE (1) TABLET BY MOUTH EACH EVENING AT 6PM  . clonazePAM (KLONOPIN) 0.5 MG tablet TAKE 1 TABLET BY MOUTH AT BEDTIME AS NEEDED  . clopidogrel (PLAVIX) 75 MG tablet TAKE ONE TABLET BY MOUTH EACH MORNING WITH BREAKFAST  . lisinopril (ZESTRIL) 5 MG tablet TAKE 1/2 TABLET BY MOUTH EVERY DAY  . Omega-3 Fatty Acids (FISH OIL) 1000 MG CAPS Take 2 capsules by mouth daily.  . pantoprazole (PROTONIX) 20 MG tablet TAKE 1 TABLET BY MOUTH EVERY DAY AS NEEDED     Review of Systems   Review of Systems  Constitutional: Negative for chills, fever and malaise/fatigue.  Cardiovascular: Positive for dyspnea on exertion. Negative for chest pain, leg swelling, near-syncope, orthopnea, palpitations and syncope.  Respiratory: Negative for cough, shortness of breath and wheezing.   Gastrointestinal: Negative for nausea and vomiting.  Neurological: Negative for dizziness, light-headedness and weakness.  All other systems reviewed and are otherwise negative except as noted above.  Physical Exam   VS:  BP 130/70 (BP Location: Left Arm, Patient Position: Sitting, Cuff Size: Normal)   Pulse (!) 46   Ht 5' 8.5" (1.74 m)   Wt 173 lb 6 oz (78.6 kg)   SpO2 98%   BMI 25.98 kg/m  , BMI Body mass index is 25.98 kg/m. GEN: Well nourished, well developed, in no acute distress. HEENT: normal. Neck: Supple, no JVD, carotid bruits, or masses. Cardiac: RRR, bradycardic, no murmurs, rubs, or gallops. No clubbing, cyanosis, edema.  Radials/DP/PT 2+ and equal bilaterally.  Respiratory:  Respirations regular and unlabored, clear to auscultation bilaterally. GI: Soft, nontender, nondistended, BS + x 4. MS: No deformity or atrophy. Skin: Warm and dry, no rash. Neuro:  Strength and sensation are intact. Psych: Normal  affect.  Assessment & Plan   1. DOE - Echo 10/22/19 with EF 50-55% (previously 45-50%), no RWMA, mildly elevated PASP, no significant valvular abnormalities.  Reports improvement in his DOE since discontinuation of Coreg.  Regular cardiovascular exercise encouraged.  No indication for further evaluation this time.  2. CAD -no anginal symptoms, no indication for ischemic evaluation this time.  Continue GDMT with aspirin, Plavix, lisinopril, statin.  Continue low-sodium heart healthy diet.  Recommend increased cardiovascular exercise.   3. PAD -  No symptoms or worsening claudication.  Continue aspirin, Plavix, statin.  4. HLD - Lab 07/27/19 LDL 29. LDL at goal of <70. Continue Atorvastatin, fish oil.  5. HTN - BP well controlled. Continue current antihypertensive regimen of lisinopril 2.5 mg daily.  6. Chronic systolic heart failure due to mild ischemic cardiomyopathy with recovered LVEF- Echo 08/22/17 LVEF 45-50%, RV mildly dilated and mildly reduced systolic function. Echo 09/2019 with EF 50-55%, RV normal size and function.  Euvolemic on exam today and well compensated.  Continue ACE inhibitor.  Coreg previously discontinued due to bradycardia, fatigue.  7. Bradycardia/RBBB - Known longstanding history.  Noted on recent EKG 09/22/2019 no dizziness, lightheadedness, near syncope.  Continue to monitor with annual EKG. no indication for monitoring at this time as he is asymptomatic.  We have discontinued his Coreg as it was contributory to fatigue.  Disposition: Follow up in 6 months with Dr. Kirke Corin or APP.    Alver Sorrow, NP 10/25/2019, 1:49 PM

## 2019-10-25 NOTE — Patient Instructions (Addendum)
Medication Instructions:  No medication changes today.   *If you need a refill on your cardiac medications before your next appointment, please call your pharmacy*   Lab Work: No lab work today.  If you have labs (blood work) drawn today and your tests are completely normal, you will receive your results only by: [name] MyChart Message (if you have MyChart) OR . A paper copy in the mail If you have any lab test that is abnormal or we need to change your treatment, we will call you to review the results.   Testing/Procedures: Your echo showed normal heart pumping function and no significant valvular abnormality.   Follow-Up: At [facility], you and your health needs are our priority.  As part of our continuing mission to provide you with exceptional heart care, we have created designated Provider Care Teams.  These Care Teams include your primary Cardiologist (physician) and Advanced Practice Providers (APPs -  Physician Assistants and Nurse Practitioners) who all work together to provide you with the care you need, when you need it.  We recommend signing up for the patient portal called "MyChart".  Sign up information is provided on this After Visit Summary.  MyChart is used to connect with patients for Virtual Visits (Telemedicine).  Patients are able to view lab/test results, encounter notes, upcoming appointments, etc.  Non-urgent messages can be sent to your provider as well.   To learn more about what you can do with MyChart, go to [email or web address].    Your next appointment:   6 month(s)  The format for your next appointment:   In Person  Provider:    You may see Lorine Bears, MD or one of the following Advanced Practice Providers on your designated Care Team:    Nicolasa Ducking, NP  Eula Listen, PA-C  Marisue Ivan, PA-C  Gillian Shields, NP

## 2019-10-28 ENCOUNTER — Other Ambulatory Visit: Payer: Self-pay | Admitting: Internal Medicine

## 2019-10-28 NOTE — Telephone Encounter (Signed)
Last filled 08-18-19 #30 Last OV 07-27-19 Next OV 07-28-20 [facility]

## 2019-11-09 ENCOUNTER — Other Ambulatory Visit: Payer: Self-pay | Admitting: Cardiovascular Disease

## 2019-12-24 ENCOUNTER — Other Ambulatory Visit: Payer: Self-pay | Admitting: Cardiovascular Disease

## 2019-12-24 ENCOUNTER — Other Ambulatory Visit: Payer: Self-pay | Admitting: Internal Medicine

## 2019-12-24 NOTE — Telephone Encounter (Signed)
Last filled 10-28-19 #30 Last OV 07-28-20 Next OV 07-27-19 [facility]

## 2019-12-24 NOTE — Telephone Encounter (Signed)
Rx request sent to pharmacy.  

## 2020-01-01 ENCOUNTER — Other Ambulatory Visit: Payer: Self-pay | Admitting: Internal Medicine

## 2020-01-21 ENCOUNTER — Emergency Department: Payer: PPO

## 2020-01-21 ENCOUNTER — Emergency Department
Admission: EM | Admit: 2020-01-21 | Discharge: 2020-01-21 | Disposition: A | Discharge status: Home / Self Care | Payer: PPO | Attending: Emergency Medicine | Admitting: Emergency Medicine

## 2020-01-21 ENCOUNTER — Other Ambulatory Visit: Payer: Self-pay

## 2020-01-21 DIAGNOSIS — I5020 Unspecified systolic (congestive) heart failure: Secondary | ICD-10-CM | POA: Insufficient documentation

## 2020-01-21 DIAGNOSIS — I2510 Atherosclerotic heart disease of native coronary artery without angina pectoris: Secondary | ICD-10-CM | POA: Diagnosis not present

## 2020-01-21 DIAGNOSIS — M545 Low back pain, unspecified: Secondary | ICD-10-CM | POA: Diagnosis not present

## 2020-01-21 DIAGNOSIS — R109 Unspecified abdominal pain: Secondary | ICD-10-CM | POA: Insufficient documentation

## 2020-01-21 DIAGNOSIS — Z87891 Personal history of nicotine dependence: Secondary | ICD-10-CM | POA: Diagnosis not present

## 2020-01-21 DIAGNOSIS — Z7982 Long term (current) use of aspirin: Secondary | ICD-10-CM | POA: Diagnosis not present

## 2020-01-21 DIAGNOSIS — J449 Chronic obstructive pulmonary disease, unspecified: Secondary | ICD-10-CM | POA: Insufficient documentation

## 2020-01-21 DIAGNOSIS — Z951 Presence of aortocoronary bypass graft: Secondary | ICD-10-CM | POA: Insufficient documentation

## 2020-01-21 DIAGNOSIS — Z79899 Other long term (current) drug therapy: Secondary | ICD-10-CM | POA: Diagnosis not present

## 2020-01-21 DIAGNOSIS — Z7901 Long term (current) use of anticoagulants: Secondary | ICD-10-CM | POA: Diagnosis not present

## 2020-01-21 DIAGNOSIS — K219 Gastro-esophageal reflux disease without esophagitis: Secondary | ICD-10-CM | POA: Insufficient documentation

## 2020-01-21 LAB — URINALYSIS, COMPLETE (UACMP) WITH MICROSCOPIC
Bacteria, UA: NONE SEEN
Bilirubin Urine: NEGATIVE
Glucose, UA: NEGATIVE mg/dL
Ketones, ur: NEGATIVE mg/dL
Leukocytes,Ua: NEGATIVE
Nitrite: NEGATIVE
Protein, ur: NEGATIVE mg/dL
Specific Gravity, Urine: 1.024 (ref 1.005–1.030)
Squamous Epithelial / HPF: NONE SEEN (ref 0–5)
pH: 5 (ref 5.0–8.0)

## 2020-01-21 MED ORDER — ORPHENADRINE CITRATE 30 MG/ML IJ SOLN
60.0000 mg | Freq: Two times a day (BID) | INTRAMUSCULAR | Status: DC
  Administered 2020-01-21: 60 mg via INTRAMUSCULAR
  Filled 2020-01-21: qty 2

## 2020-01-21 MED ORDER — KETOROLAC TROMETHAMINE 10 MG PO TABS: 10.0000 mg | ORAL_TABLET | Freq: Four times a day (QID) | ORAL | 0 refills | Status: DC | PRN

## 2020-01-21 MED ORDER — LIDOCAINE 5 % EX PTCH
1.0000 | MEDICATED_PATCH | CUTANEOUS | Status: DC
  Administered 2020-01-21: 1 via TRANSDERMAL
  Filled 2020-01-21: qty 1

## 2020-01-21 MED ORDER — KETOROLAC TROMETHAMINE 30 MG/ML IJ SOLN
30.0000 mg | Freq: Once | INTRAMUSCULAR | Status: AC
  Administered 2020-01-21: 30 mg via INTRAMUSCULAR
  Filled 2020-01-21: qty 1

## 2020-01-21 MED ORDER — LIDOCAINE 5 % EX PTCH: 1.0000 | MEDICATED_PATCH | Freq: Two times a day (BID) | CUTANEOUS | 0 refills | Status: DC

## 2020-01-21 MED ORDER — ORPHENADRINE CITRATE ER 100 MG PO TB12: 100.0000 mg | ORAL_TABLET | Freq: Two times a day (BID) | ORAL | 0 refills | Status: DC

## 2020-01-21 NOTE — ED Provider Notes (Signed)
[facility] Emergency Department Provider Note   ____________________________________________   First MD Initiated Contact with Patient 01/21/20 [phone]     (approximate)  I have reviewed the triage vital signs and the nursing notes.   HISTORY  Chief Complaint Back Pain    HPI Franklin Marks is a 69 y.o. male patient complain of right flank and back pain secondary to a trip and fall while hiking 2 days ago.  Patient denies dysuria/ hematuria, radicular component to his pain, or bowel dysfunction.  Rates pain as 8/10.  Describes pain as "achy".  No palliative measure for complaint.         Past Medical History:  Diagnosis Date  . Anemia   . Atrial fibrillation (HCC) 01/13/2012  . CAD (coronary artery disease)    s/p CABG 2001  . COPD (chronic obstructive pulmonary disease) (HCC)    ongoing tobacco use  . GERD (gastroesophageal reflux disease)   . Ischemic cardiomyopathy 01/10/2012   LVEF 35-40%, mild conc hypertrophy, servere HK of anteroseptum, borderline RVH  . NSTEMI (non-ST elevated myocardial infarction) (HCC) 01/10/2012   Associated with ventricular fibrillation    Patient Active Problem List   Diagnosis Date Noted  . GERD (gastroesophageal reflux disease) 07/22/2018  . Advance directive discussed with patient 07/14/2017  . Systolic CHF (HCC) 06/20/2015  . Osteoarthrosis involving multiple sites 06/20/2015  . Stented coronary artery   . Hyperlipidemia 04/11/2014  . Routine general medical examination at a health care facility 06/14/2013  . Restless legs syndrome 09/10/2012  . PAD (peripheral artery disease) (HCC) 04/28/2012  . Sinus bradycardia 02/27/2012  . CAD (coronary artery disease) 01/10/2012  . COPD (chronic obstructive pulmonary disease) (HCC) 01/10/2012  . Ischemic cardiomyopathy 01/10/2012    Past Surgical History:  Procedure Laterality Date  . C[facility]  2011   [facility]  . CARDIAC CATHETERIZATION  12/2011    prox LAD occlusion, ostial RCA occlusion, 30% prox LCx stenosis, LIMA-LAD: 80% post-anastamosis lesion, SVG-Dx2: old occlusion w/ thrombus (noted on prior 2011 cath at [facility]), SVG-Dx: patent, SVG-RCA: patent, diffuse irregs, 80-90% PDA/PLA lesions patent  . CARDIAC CATHETERIZATION N/A 03/15/2015   Procedure: Left Heart Cath and Cors/Grafts Angiography;  Surgeon: Kathleene Hazelhristopher D McAlhany, MD;  [facility];  Service: Cardiovascular;  Laterality: N/A;  . CARDIAC CATHETERIZATION N/A 03/15/2015   Procedure: Coronary Stent Intervention;  Surgeon: Kathleene Hazelhristopher D McAlhany, MD;  Location: [facility] INVASIVE CV LAB;  Service: Cardiovascular;  Laterality: N/A;  svg to diagnoal 2  . CORONARY ARTERY BYPASS GRAFT  2001  . LEFT HEART CATHETERIZATION WITH CORONARY ANGIOGRAM N/A 01/10/2012   Procedure: LEFT HEART CATHETERIZATION WITH CORONARY ANGIOGRAM;  Surgeon: Lennette Biharihomas A Kelly, MD;  [facility];  Service: Cardiovascular;  Laterality: N/A;    Prior to Admission medications   Medication Sig Start Date End Date Taking? Authorizing Provider  aspirin 81 MG chewable tablet Chew 1 tablet (81 mg total) by mouth daily. 01/15/12   Arguello, Roger A, PA-C  atorvastatin (LIPITOR) 80 MG tablet TAKE ONE (1) TABLET BY MOUTH EACH EVENING AT 6PM 12/24/19   Iran OuchArida, Muhammad A, MD  clonazePAM (KLONOPIN) 0.5 MG tablet TAKE 1 TABLET BY MOUTH EVERY DAY AT BEDTIME AS NEEDED 12/26/19   Karie SchwalbeLetvak, Richard I, MD  clopidogrel (PLAVIX) 75 MG tablet TAKE ONE TABLET BY MOUTH EACH MORNING WITH BREAKFAST 12/24/19   Iran OuchArida, Muhammad A, MD  ketorolac (TORADOL) 10 MG tablet Take 1 tablet (10 mg total) by mouth every  6 (six) hours as needed. 01/21/20   Joni Reining, PA-C  lidocaine (LIDODERM) 5 % Place 1 patch onto the skin every 12 (twelve) hours. Remove & Discard patch within 12 hours or as directed by MD 01/21/20 01/20/21  Joni Reining, PA-C  lisinopril (ZESTRIL) 5 MG tablet TAKE 1/2 TABLET BY MOUTH EVERY DAY 01/01/20   [name], MD  Omega-3 Fatty Acids (FISH OIL) 1000 MG CAPS Take 2 capsules by mouth daily.    [provider]  orphenadrine (NORFLEX) 100 MG tablet Take 1 tablet (100 mg total) by mouth 2 (two) times daily. 01/21/20   Joni Reining, PA-C  pantoprazole (PROTONIX) 20 MG tablet TAKE 1 TABLET BY MOUTH EVERY DAY AS NEEDED 08/17/19   [name], MD    Allergies Codeine  Family History  Problem Relation Age of Onset  . Hyperlipidemia Mother   . Alcohol abuse Father   . Hyperlipidemia Father   . Heart disease Sister   . Hyperlipidemia Sister   . Heart disease Brother   . Hyperlipidemia Brother   . Hyperlipidemia Sister   . Diabetes Neg Hx   . Hypertension Neg Hx     Social History Social History   Tobacco Use  . Smoking status: Former Smoker    Packs/day: 1.00    Years: 45.00    Pack years: 45.00    Types: Cigarettes    Quit date: 03/13/2015    Years since quitting: 4.8  . Smokeless tobacco: Never Used  Vaping Use  . Vaping Use: Never used  Substance Use Topics  . Alcohol use: Yes    Alcohol/week: 0.0 standard drinks    Comment: really rare beer  . Drug use: Yes    Types: Marijuana    Review of Systems Constitutional: No fever/chills Eyes: No visual changes. ENT: No sore throat. Cardiovascular: Denies chest pain. Respiratory: Denies shortness of breath. Gastrointestinal: GERD.  No abdominal pain.  No nausea, no vomiting.  No diarrhea.  No constipation. Genitourinary: Negative for dysuria. Musculoskeletal: Negative for back pain. Skin: Negative for rash. Neurological: Negative for headaches, focal weakness or numbness. Endocrine:  Hyperlipidemia and hypertension. Hematological/Lymphatic:  Anemia Allergic/Immunilogical: Codeine. ____________________________________________   PHYSICAL EXAM:  VITAL SIGNS: ED Triage Vitals  Enc Vitals Group     BP 01/21/20 0729 134/84     Pulse Rate 01/21/20 0729 (!) 55     Resp 01/21/20 0729 17     Temp  01/21/20 0732 (!) 97.5 F (36.4 C)     Temp Source 01/21/20 0729 Oral     SpO2 01/21/20 0729 96 %     Weight 01/21/20 0730 160 lb (72.6 kg)     Height 01/21/20 0730 5\' 8"  (1.727 m)     Head Circumference --      Peak Flow --      Pain Score 01/21/20 0730 8     Pain Loc --      Pain Edu? --      Excl. in GC? --     Constitutional: Alert and oriented. Well appearing and in no acute distress. Eyes: Conjunctivae are normal. PERRL. EOMI. Head: Atraumatic. Nose: No congestion/rhinnorhea. Mouth/Throat: Mucous membranes are moist.  Oropharynx non-erythematous. Neck: No stridor.  No cervical spine tenderness to palpation. Hematological/Lymphatic/Immunilogical: No cervical lymphadenopathy. Cardiovascular: Bradycardic, regular rhythm. Grossly normal heart sounds.  Good peripheral circulation. Respiratory: Normal respiratory effort.  No retractions. Lungs CTAB. Gastrointestinal: Soft and nontender. No distention. No abdominal bruits.  Right  CVA tenderness. Musculoskeletal: No lower extremity tenderness nor edema.  No joint effusions. Neurologic:  Normal speech and language. No gross focal neurologic deficits are appreciated. No gait instability. Skin:  Skin is warm, dry and intact. No rash noted.  No abrasion or ecchymosis. Psychiatric: Mood and affect are normal. Speech and behavior are normal.  ____________________________________________   LABS (all labs ordered are listed, but only abnormal results are displayed)  Labs Reviewed  URINALYSIS, COMPLETE (UACMP) WITH MICROSCOPIC - Abnormal; Notable for the following components:      Result Value   Color, Urine YELLOW (*)    APPearance HAZY (*)    Hgb urine dipstick SMALL (*)    All other components within normal limits   ____________________________________________  EKG   ____________________________________________  RADIOLOGY I, Joni Reining, personally viewed and evaluated these images (plain radiographs) as part of my  medical decision making, as well as reviewing the written report by the radiologist.  ED MD interpretation: No acute findings except for degenerative changes in the lumbar spine.  Official radiology report(s): DG Lumbar Spine 2-3 Views  Result Date: 01/21/2020 CLINICAL DATA:  Right flank pain following a fall 2 days ago. EXAM: LUMBAR SPINE - 2-3 VIEW COMPARISON:  07/31/2010 FINDINGS: Five non-rib-bearing lumbar vertebrae. Marked disc space narrowing at the L4-5 level with progression. Mild to moderate anterior and moderate to marked lateral spur formation on the right at that level. Mild anterior spur formation at other levels of the lumbar and lower thoracic spine. Facet degenerative changes at the L4-5 and L5-S1 levels. No fractures, pars defects or subluxations. Atheromatous arterial calcifications without visible aneurysm. Prominent stool in the right and transverse colon. IMPRESSION: 1. No fracture or subluxation. 2. Degenerative changes, as described above. Electronically Signed   By: Beckie Salts M.D.   On: 01/21/2020 08:13    ____________________________________________   PROCEDURES  Procedure(s) performed (including Critical Care):  Procedures   ____________________________________________   INITIAL IMPRESSION / ASSESSMENT AND PLAN / ED COURSE  As part of my medical decision making, I reviewed the following data within the electronic MEDICAL RECORD NUMBER         Patient presents with low back pain secondary to fall.  Discussed no acute findings with x-ray lumbar spine.  Urinalysis unremarkable.  Patient complaint and physical exam consistent with back contusion secondary to fall.  Patient given discharge care instruction advised take medication as directed.  Follow-up with PCP.      ____________________________________________   FINAL CLINICAL IMPRESSION(S) / ED DIAGNOSES  Final diagnoses:  Acute right-sided low back pain, unspecified whether sciatica present     ED  Discharge Orders         Ordered    orphenadrine (NORFLEX) 100 MG tablet  2 times daily        01/21/20 0900    ketorolac (TORADOL) 10 MG tablet  Every 6 hours PRN        01/21/20 0900    lidocaine (LIDODERM) 5 %  Every 12 hours        01/21/20 0900          *Please note:  AYDRIAN HALPIN was evaluated in Emergency Department on 01/21/2020 for the symptoms described in the history of present illness. He was evaluated in the context of the global COVID-19 pandemic, which necessitated consideration that the patient might be at risk for infection with the SARS-CoV-2 virus that causes COVID-19. Institutional protocols and algorithms that pertain to the evaluation of patients at  risk for COVID-19 are in a state of rapid change based on information released by regulatory bodies including the CDC and federal and state organizations. These policies and algorithms were followed during the patient's care in the ED.  Some ED evaluations and interventions may be delayed as a result of limited staffing during and the pandemic.*   Note:  This document was prepared using Dragon voice recognition software and may include unintentional dictation errors.    Joni Reining, PA-C 01/21/20 1141    Gilles Chiquito, MD 01/21/20 [phone]

## 2020-01-21 NOTE — ED Triage Notes (Signed)
Pt states he tripped and fell while hiking [place] yesterday and has been having low to mid back pain since.

## 2020-01-31 ENCOUNTER — Telehealth: Payer: Self-pay

## 2020-01-31 NOTE — Telephone Encounter (Signed)
Called pt to see how he was doing after recent ER visit for back pain. Stated he was doing better and appreciated the call.

## 2020-02-16 ENCOUNTER — Telehealth: Payer: Self-pay | Admitting: *Deleted

## 2020-02-16 NOTE — Telephone Encounter (Signed)
Patient called stating that he has been trying to get a refill on his lisinopril.  Patient stated that the bottle he has does not have refills. Advised patient to reach out to the pharmacy and let them know that Dr. Alphonsus Sias sent him a new script in on 01/01/20 #45/3 refills and they should have the prescription on hold. Patient stated that he will reach out to the pharmacy and give them this information Advised patient to call back if he has any problems.[name]

## 2020-03-17 ENCOUNTER — Other Ambulatory Visit: Payer: Self-pay | Admitting: Internal Medicine

## 2020-03-20 NOTE — Telephone Encounter (Signed)
Last filled 12-26-19 #30 Last OV 07-28-20 Next OV 07-27-19 [facility]

## 2020-03-21 ENCOUNTER — Ambulatory Visit (INDEPENDENT_AMBULATORY_CARE_PROVIDER_SITE_OTHER): Payer: PPO

## 2020-03-21 DIAGNOSIS — Z23 Encounter for immunization: Secondary | ICD-10-CM

## 2020-05-02 ENCOUNTER — Ambulatory Visit: Payer: PPO | Admitting: Cardiovascular Disease

## 2020-05-02 ENCOUNTER — Other Ambulatory Visit: Payer: Self-pay

## 2020-05-02 ENCOUNTER — Encounter: Payer: Self-pay | Admitting: Cardiovascular Disease

## 2020-05-02 VITALS — BP 130/80 | HR 64 | Ht 68.0 in | Wt 173.0 lb

## 2020-05-02 DIAGNOSIS — E785 Hyperlipidemia, unspecified: Secondary | ICD-10-CM

## 2020-05-02 DIAGNOSIS — I739 Peripheral vascular disease, unspecified: Secondary | ICD-10-CM | POA: Diagnosis not present

## 2020-05-02 DIAGNOSIS — I2510 Atherosclerotic heart disease of native coronary artery without angina pectoris: Secondary | ICD-10-CM

## 2020-05-02 DIAGNOSIS — I5022 Chronic systolic (congestive) heart failure: Secondary | ICD-10-CM

## 2020-05-02 DIAGNOSIS — I10 Essential (primary) hypertension: Secondary | ICD-10-CM | POA: Diagnosis not present

## 2020-05-02 NOTE — Patient Instructions (Signed)

## 2020-05-02 NOTE — Progress Notes (Signed)
Cardiology Office Note   Date:  05/02/2020   ID:  Franklin Marks, DOB 1950-12-16, MRN [id number]  PCP:  Franklin Schwalbe, MD  Cardiologist:   Franklin Bears, MD   Chief Complaint  Patient presents with  . Follow-up    6 months       History of Present Illness: Franklin Marks is a 70 y.o. male who presents for a follow-up visit regarding peripheral arterial disease,coronary artery disease and chronic systolic heart failure due to ischemic cardiomyopathy. He has known history of cardiac arrest occurring in the setting of coronary artery disease and chest pain. He is status post CABG in 2001. He has known history of peripheral arterial disease with mildly reduced ABI bilaterally and chronic total occlusion of the SFA on both sides with reconstitution distally via collaterals.  He had non-ST elevation myocardial infarction in December 2016. He underwent PCI and drug-eluting stent placement to the SVG to second diagonal. EF was 35-45%.  He was seen in July 2021 with increased dyspnea and 10 pound weight gain.  Carvedilol was discontinued due to bradycardia.  He underwent an echocardiogram in August which showed an EF of 50 to 55% with mildly elevated pulmonary pressure. He has been doing well with no recent chest pain or worsening dyspnea.  He describes stable bilateral calf claudication which is currently not lifestyle limiting.  Past Medical History:  Diagnosis Date  . Anemia   . Atrial fibrillation (HCC) 01/13/2012  . CAD (coronary artery disease)    s/p CABG 2001  . COPD (chronic obstructive pulmonary disease) (HCC)    ongoing tobacco use  . GERD (gastroesophageal reflux disease)   . Ischemic cardiomyopathy 01/10/2012   LVEF 35-40%, mild conc hypertrophy, servere HK of anteroseptum, borderline RVH  . NSTEMI (non-ST elevated myocardial infarction) (HCC) 01/10/2012   Associated with ventricular fibrillation    Past Surgical History:  Procedure Laterality Date  . CARDIAC  CATHETERIZATION  2011   At [facility]  . CARDIAC CATHETERIZATION  12/2011   prox LAD occlusion, ostial RCA occlusion, 30% prox LCx stenosis, LIMA-LAD: 80% post-anastamosis lesion, SVG-Dx2: old occlusion w/ thrombus (noted on prior 2011 cath at [facility]), SVG-Dx: patent, SVG-RCA: patent, diffuse irregs, 80-90% PDA/PLA lesions patent  . CARDIAC CATHETERIZATION N/A 03/15/2015   Procedure: Left Heart Cath and Cors/Grafts Angiography;  Surgeon: Kathleene Hazel, MD;  Location: [facility] INVASIVE CV LAB;  Service: Cardiovascular;  Laterality: N/A;  . CARDIAC CATHETERIZATION N/A 03/15/2015   Procedure: Coronary Stent Intervention;  Surgeon: Kathleene Hazel, MD;  Location: [facility] INVASIVE CV LAB;  Service: Cardiovascular;  Laterality: N/A;  svg to diagnoal 2  . CORONARY ARTERY BYPASS GRAFT  2001  . LEFT HEART CATHETERIZATION WITH CORONARY ANGIOGRAM N/A 01/10/2012   Procedure: LEFT HEART CATHETERIZATION WITH CORONARY ANGIOGRAM;  Surgeon: Lennette Bihari, MD;  Location: [facility] CATH LAB;  Service: Cardiovascular;  Laterality: N/A;     Current Outpatient Medications  Medication Sig Dispense Refill  . aspirin 81 MG chewable tablet Chew 1 tablet (81 mg total) by mouth daily.    [name] atorvastatin (LIPITOR) 80 MG tablet TAKE ONE (1) TABLET BY MOUTH EACH EVENING AT 6PM 90 tablet 1  . clonazePAM (KLONOPIN) 0.5 MG tablet TAKE 1 TABLET BY MOUTH EVERY DAY AT BEDTIME AS NEEDED 30 tablet 0  . clopidogrel (PLAVIX) 75 MG tablet TAKE ONE TABLET BY MOUTH EACH MORNING WITH BREAKFAST 90 tablet 1  . lisinopril (ZESTRIL) 5 MG tablet TAKE 1/2 TABLET BY MOUTH EVERY  DAY 45 tablet 3  . Omega-3 Fatty Acids (FISH OIL) 1000 MG CAPS Take 2 capsules by mouth daily.    . pantoprazole (PROTONIX) 20 MG tablet TAKE 1 TABLET BY MOUTH EVERY DAY AS NEEDED 90 tablet 3   No current facility-administered medications for this visit.    Allergies:   Codeine    Social History:  The patient  reports that he quit smoking about 5 years ago. His smoking  use included cigarettes. He has a 45.00 pack-year smoking history. He has never used smokeless tobacco. He reports current alcohol use. He reports current drug use. Drug: Marijuana.   Family History:  The patient's family history includes Alcohol abuse in his father; Heart disease in his brother and sister; Hyperlipidemia in his brother, father, mother, sister, and sister.    ROS:  Please see the history of present illness.   Otherwise, review of systems are positive for none.   All other systems are reviewed and negative.    PHYSICAL EXAM: VS:  BP 130/80   Pulse 64   Ht 5\' 8"  (1.727 m)   Wt 173 lb (78.5 kg)   BMI 26.30 kg/m  , BMI Body mass index is 26.3 kg/m. GEN: Well nourished, well developed, in no acute distress  HEENT: normal  Neck: no JVD, carotid bruits, or masses Cardiac: RRR; no murmurs, rubs, or gallops,no edema  Respiratory:  clear to auscultation bilaterally, normal work of breathing GI: soft, nontender, nondistended, + BS MS: no deformity or atrophy  Skin: warm and dry, no rash Neuro:  Strength and sensation are intact Psych: euthymic mood, full affect   EKG:  EKG is ordered today. The ekg ordered today demonstrates sinus rhythm with first-degree AV block and right bundle branch block.   Recent Labs: 07/27/2019: ALT 14; BUN 14; Creatinine, Ser 0.85; Hemoglobin 13.4; Platelets 153.0; Potassium 4.1; Sodium 138    Lipid Panel    Component Value Date/Time   CHOL 81 07/27/2019 0909   TRIG 117.0 07/27/2019 0909   HDL 28.90 (L) 07/27/2019 0909   CHOLHDL 3 07/27/2019 0909   VLDL 23.4 07/27/2019 0909   LDLCALC 29 07/27/2019 0909   LDLDIRECT 55.0 07/14/2017 1552      Wt Readings from Last 3 Encounters:  05/02/20 173 lb (78.5 kg)  01/21/20 160 lb (72.6 kg)  10/25/19 173 lb 6 oz (78.6 kg)       No flowsheet data found.    ASSESSMENT AND PLAN:  1.  Peripheral arterial disease: Patient has known occluded SFA bilaterally.   He has mild nonlifestyle  limiting claudication of bilateral calf.  Continue medical therapy.  2. Coronary artery disease status post angioplasty and drug-eluting stent placement of SVG to second diagonal in December 2016.   Continue long-term dual antiplatelet therapy as tolerated.  3. Hyperlipidemia: Continue high-dose atorvastatin and fish oil.  Lipid profile last year showed an LDL of 55 and triglyceride of 117  4. Essential hypertension: Blood pressure is well controlled on current medications.   5.  Chronic systolic heart failure due to mild ischemic cardiomyopathy: Most recent ejection fraction was 50 to 55%.  Carvedilol had to be discontinued due to symptomatic bradycardia.  The patient is on small dose lisinopril.   Disposition:   FU with me in 6 months  Signed,  January 2017, MD  05/02/2020 2:18 PM    [facility]

## 2020-05-03 NOTE — Addendum Note (Signed)
Addended by: Reyli Schroth, Lakiah Dhingra D on: 05/03/2020 03:51 PM   Modules accepted: Orders  

## 2020-05-09 ENCOUNTER — Other Ambulatory Visit: Payer: Self-pay | Admitting: Cardiovascular Disease

## 2020-05-09 NOTE — Telephone Encounter (Signed)
Rx request sent to pharmacy.  

## 2020-05-30 ENCOUNTER — Other Ambulatory Visit: Payer: Self-pay | Admitting: Internal Medicine

## 2020-05-30 NOTE — Telephone Encounter (Signed)
Last filled 03-20-20 #30 Last OV 07-27-19 Next OV 08-01-20 [facility]

## 2020-07-07 ENCOUNTER — Other Ambulatory Visit: Payer: Self-pay

## 2020-07-07 ENCOUNTER — Inpatient Hospital Stay (HOSPITAL_COMMUNITY)
Admission: EM | Admit: 2020-07-07 | Discharge: 2020-07-11 | DRG: 247 | Disposition: A | Discharge status: Home / Self Care | Payer: PPO | Attending: Internal Medicine | Admitting: Internal Medicine

## 2020-07-07 ENCOUNTER — Encounter (HOSPITAL_COMMUNITY): Payer: Self-pay

## 2020-07-07 ENCOUNTER — Emergency Department (HOSPITAL_COMMUNITY): Payer: PPO

## 2020-07-07 DIAGNOSIS — Z83438 Family history of other disorder of lipoprotein metabolism and other lipidemia: Secondary | ICD-10-CM

## 2020-07-07 DIAGNOSIS — I25110 Atherosclerotic heart disease of native coronary artery with unstable angina pectoris: Secondary | ICD-10-CM | POA: Diagnosis present

## 2020-07-07 DIAGNOSIS — Z811 Family history of alcohol abuse and dependence: Secondary | ICD-10-CM

## 2020-07-07 DIAGNOSIS — I482 Chronic atrial fibrillation, unspecified: Secondary | ICD-10-CM | POA: Diagnosis present

## 2020-07-07 DIAGNOSIS — Z8674 Personal history of sudden cardiac arrest: Secondary | ICD-10-CM

## 2020-07-07 DIAGNOSIS — R110 Nausea: Secondary | ICD-10-CM | POA: Diagnosis not present

## 2020-07-07 DIAGNOSIS — Z79899 Other long term (current) drug therapy: Secondary | ICD-10-CM | POA: Diagnosis not present

## 2020-07-07 DIAGNOSIS — I110 Hypertensive heart disease with heart failure: Secondary | ICD-10-CM | POA: Diagnosis present

## 2020-07-07 DIAGNOSIS — I252 Old myocardial infarction: Secondary | ICD-10-CM | POA: Diagnosis not present

## 2020-07-07 DIAGNOSIS — Z20822 Contact with and (suspected) exposure to covid-19: Secondary | ICD-10-CM | POA: Diagnosis present

## 2020-07-07 DIAGNOSIS — I2510 Atherosclerotic heart disease of native coronary artery without angina pectoris: Secondary | ICD-10-CM | POA: Diagnosis present

## 2020-07-07 DIAGNOSIS — J449 Chronic obstructive pulmonary disease, unspecified: Secondary | ICD-10-CM | POA: Diagnosis present

## 2020-07-07 DIAGNOSIS — R079 Chest pain, unspecified: Secondary | ICD-10-CM | POA: Diagnosis not present

## 2020-07-07 DIAGNOSIS — I70203 Unspecified atherosclerosis of native arteries of extremities, bilateral legs: Secondary | ICD-10-CM | POA: Diagnosis present

## 2020-07-07 DIAGNOSIS — I5022 Chronic systolic (congestive) heart failure: Secondary | ICD-10-CM | POA: Diagnosis present

## 2020-07-07 DIAGNOSIS — I441 Atrioventricular block, second degree: Secondary | ICD-10-CM | POA: Diagnosis present

## 2020-07-07 DIAGNOSIS — Z955 Presence of coronary angioplasty implant and graft: Secondary | ICD-10-CM

## 2020-07-07 DIAGNOSIS — I4510 Unspecified right bundle-branch block: Secondary | ICD-10-CM | POA: Diagnosis present

## 2020-07-07 DIAGNOSIS — D649 Anemia, unspecified: Secondary | ICD-10-CM | POA: Diagnosis present

## 2020-07-07 DIAGNOSIS — Z8249 Family history of ischemic heart disease and other diseases of the circulatory system: Secondary | ICD-10-CM

## 2020-07-07 DIAGNOSIS — I255 Ischemic cardiomyopathy: Secondary | ICD-10-CM | POA: Diagnosis present

## 2020-07-07 DIAGNOSIS — G2581 Restless legs syndrome: Secondary | ICD-10-CM | POA: Diagnosis present

## 2020-07-07 DIAGNOSIS — R001 Bradycardia, unspecified: Secondary | ICD-10-CM | POA: Diagnosis present

## 2020-07-07 DIAGNOSIS — Z7982 Long term (current) use of aspirin: Secondary | ICD-10-CM | POA: Diagnosis not present

## 2020-07-07 DIAGNOSIS — J439 Emphysema, unspecified: Secondary | ICD-10-CM | POA: Diagnosis not present

## 2020-07-07 DIAGNOSIS — G4700 Insomnia, unspecified: Secondary | ICD-10-CM | POA: Diagnosis present

## 2020-07-07 DIAGNOSIS — Z87891 Personal history of nicotine dependence: Secondary | ICD-10-CM | POA: Diagnosis not present

## 2020-07-07 DIAGNOSIS — Z885 Allergy status to narcotic agent status: Secondary | ICD-10-CM

## 2020-07-07 DIAGNOSIS — Z8616 Personal history of COVID-19: Secondary | ICD-10-CM

## 2020-07-07 DIAGNOSIS — E782 Mixed hyperlipidemia: Secondary | ICD-10-CM | POA: Diagnosis present

## 2020-07-07 DIAGNOSIS — I25119 Atherosclerotic heart disease of native coronary artery with unspecified angina pectoris: Secondary | ICD-10-CM | POA: Diagnosis not present

## 2020-07-07 DIAGNOSIS — I739 Peripheral vascular disease, unspecified: Secondary | ICD-10-CM | POA: Diagnosis not present

## 2020-07-07 DIAGNOSIS — I361 Nonrheumatic tricuspid (valve) insufficiency: Secondary | ICD-10-CM | POA: Diagnosis not present

## 2020-07-07 DIAGNOSIS — K219 Gastro-esophageal reflux disease without esophagitis: Secondary | ICD-10-CM | POA: Diagnosis present

## 2020-07-07 DIAGNOSIS — Z951 Presence of aortocoronary bypass graft: Secondary | ICD-10-CM | POA: Diagnosis not present

## 2020-07-07 DIAGNOSIS — I5020 Unspecified systolic (congestive) heart failure: Secondary | ICD-10-CM | POA: Diagnosis present

## 2020-07-07 DIAGNOSIS — I25710 Atherosclerosis of autologous vein coronary artery bypass graft(s) with unstable angina pectoris: Secondary | ICD-10-CM | POA: Diagnosis not present

## 2020-07-07 DIAGNOSIS — E785 Hyperlipidemia, unspecified: Secondary | ICD-10-CM | POA: Diagnosis not present

## 2020-07-07 DIAGNOSIS — R0789 Other chest pain: Secondary | ICD-10-CM | POA: Diagnosis not present

## 2020-07-07 DIAGNOSIS — I10 Essential (primary) hypertension: Secondary | ICD-10-CM | POA: Diagnosis not present

## 2020-07-07 DIAGNOSIS — Z7902 Long term (current) use of antithrombotics/antiplatelets: Secondary | ICD-10-CM

## 2020-07-07 DIAGNOSIS — I200 Unstable angina: Secondary | ICD-10-CM | POA: Diagnosis present

## 2020-07-07 DIAGNOSIS — I2584 Coronary atherosclerosis due to calcified coronary lesion: Secondary | ICD-10-CM | POA: Diagnosis present

## 2020-07-07 LAB — CBC
HCT: 36.9 % — ABNORMAL LOW (ref 39.0–52.0)
Hemoglobin: 12.4 g/dL — ABNORMAL LOW (ref 13.0–17.0)
MCH: 31.1 pg (ref 26.0–34.0)
MCHC: 33.6 g/dL (ref 30.0–36.0)
MCV: 92.5 fL (ref 80.0–100.0)
Platelets: 187 10*3/uL (ref 150–400)
RBC: 3.99 MIL/uL — ABNORMAL LOW (ref 4.22–5.81)
RDW: 13.2 % (ref 11.5–15.5)
WBC: 7.7 10*3/uL (ref 4.0–10.5)
nRBC: 0 % (ref 0.0–0.2)

## 2020-07-07 LAB — TROPONIN I (HIGH SENSITIVITY): Troponin I (High Sensitivity): 10 ng/L (ref <18)

## 2020-07-07 LAB — BASIC METABOLIC PANEL
Anion gap: 8 (ref 5–15)
BUN: 22 mg/dL (ref 8–23)
CO2: 22 mmol/L (ref 22–32)
Calcium: 8.7 mg/dL — ABNORMAL LOW (ref 8.9–10.3)
Chloride: 106 mmol/L (ref 98–111)
Creatinine, Ser: 0.9 mg/dL (ref 0.61–1.24)
GFR, Estimated: >60 mL/min (ref >60)
Glucose, Bld: 113 mg/dL — ABNORMAL HIGH (ref 70–99)
Potassium: 3.8 mmol/L (ref 3.5–5.1)
Sodium: 136 mmol/L (ref 135–145)

## 2020-07-07 MED ORDER — ASPIRIN 81 MG PO CHEW
81.0000 mg | CHEWABLE_TABLET | Freq: Every day | ORAL | Status: DC
  Administered 2020-07-08 – 2020-07-10 (×3): 81 mg via ORAL
  Filled 2020-07-07 (×4): qty 1

## 2020-07-07 MED ORDER — CLOPIDOGREL BISULFATE 75 MG PO TABS
75.0000 mg | ORAL_TABLET | Freq: Every day | ORAL | Status: DC
  Administered 2020-07-08 – 2020-07-11 (×4): 75 mg via ORAL
  Filled 2020-07-07 (×5): qty 1

## 2020-07-07 MED ORDER — ONDANSETRON HCL 4 MG/2ML IJ SOLN: 4.0000 mg | Freq: Four times a day (QID) | INTRAMUSCULAR | Status: DC | PRN

## 2020-07-07 MED ORDER — ISOSORBIDE MONONITRATE ER 30 MG PO TB24
30.0000 mg | ORAL_TABLET | Freq: Every day | ORAL | Status: DC
  Administered 2020-07-08 – 2020-07-11 (×4): 30 mg via ORAL
  Filled 2020-07-07 (×4): qty 1

## 2020-07-07 MED ORDER — CLONAZEPAM 0.5 MG PO TABS
0.5000 mg | ORAL_TABLET | Freq: Every evening | ORAL | Status: DC | PRN
  Administered 2020-07-08 (×2): 0.5 mg via ORAL
  Filled 2020-07-07 (×2): qty 1

## 2020-07-07 MED ORDER — HEPARIN SODIUM (PORCINE) 5000 UNIT/ML IJ SOLN: 5000.0000 [IU] | Freq: Three times a day (TID) | INTRAMUSCULAR | Status: DC

## 2020-07-07 MED ORDER — PANTOPRAZOLE SODIUM 20 MG PO TBEC
20.0000 mg | DELAYED_RELEASE_TABLET | Freq: Every day | ORAL | Status: DC
  Administered 2020-07-08 – 2020-07-11 (×3): 20 mg via ORAL
  Filled 2020-07-07 (×4): qty 1

## 2020-07-07 MED ORDER — ACETAMINOPHEN 325 MG PO TABS
650.0000 mg | ORAL_TABLET | ORAL | Status: DC | PRN
  Filled 2020-07-07: qty 2

## 2020-07-07 MED ORDER — ATORVASTATIN CALCIUM 80 MG PO TABS
80.0000 mg | ORAL_TABLET | Freq: Every day | ORAL | Status: DC
  Administered 2020-07-08 – 2020-07-11 (×4): 80 mg via ORAL
  Filled 2020-07-07 (×4): qty 1

## 2020-07-07 MED ORDER — LISINOPRIL 2.5 MG PO TABS
2.5000 mg | ORAL_TABLET | Freq: Every day | ORAL | Status: DC
  Administered 2020-07-08 – 2020-07-11 (×4): 2.5 mg via ORAL
  Filled 2020-07-07 (×4): qty 1

## 2020-07-07 NOTE — ED Notes (Signed)
Pt denies chest pain at this time.

## 2020-07-07 NOTE — ED Triage Notes (Signed)
EMS reports pt is from home. C/O intermittent chest pain x 1 hour. Pt chest had resolved by EMS arrival. Pt took ASA 324mg  PO PTA. No nitro given. History of Bypass x 5, 1 stent placement, and 1 cardiac arrest.

## 2020-07-07 NOTE — H&P (Signed)
History and Physical   Franklin Marks ENI:[id number] DOB: 02-02-1951 DOA: 07/07/2020  PCP: [name], MD   Patient coming from: Home  Chief Complaint: Chest pain  HPI: COBI DELPH is a 70 y.o. male with medical history significant of CAD status post CABG and stent, COPD, GERD, PAD, systolic heart failure, RLS who presents after an episode of chest pain.  Patient was working around the house helping his daughter paint which is a higher level of activity been normal for him.  He began having a episode of chest pain about 1 hour prior to arrival to the ED.  The pain is described as a sudden onset of left sided chest pain radiating to both shoulders.  He states the pain was about a 3-4 out of 10 and had some associated diaphoresis.  No nausea.  Pain lasted for about 10 to 15 minutes.  States the pain was similar to his previous MI.  He took 4 aspirins and called EMS.  His pain was already improved by the time EMS arrived.  Denies fever, chills, shortness of breath, abdominal pain, constipation, diarrhea, nausea, vomiting.  ED Course: Vital signs in the ED significant for blood pressure in the 140s systolic.  Lab work-up showed BMP with glucose 113, calcium 8.7.  CBC showed hemoglobin of 12.4 with normal MCV.  Troponin was initially normal with repeat pending.  EKG showed sinus rhythm at 66 bpm with right bundle branch block and first-degree AV block similar to previous.  Chest x-ray showed no acute abnormalities.  Cardiology was consulted and recommend observation considering his unstable angina and his cardiac history.  Also recommend starting Imdur.  They will see the patient in the morning and give further recommendations.  Plan would be for stress testing at some point though this may not be available over the weekend.  Review of Systems: As per HPI otherwise all other systems reviewed and are negative.  Past Medical History:  Diagnosis Date  . Anemia   . Atrial fibrillation (HCC)  01/13/2012  . CAD (coronary artery disease)    s/p CABG 2001  . COPD (chronic obstructive pulmonary disease) (HCC)    ongoing tobacco use  . GERD (gastroesophageal reflux disease)   . Ischemic cardiomyopathy 01/10/2012   LVEF 35-40%, mild conc hypertrophy, servere HK of anteroseptum, borderline RVH  . NSTEMI (non-ST elevated myocardial infarction) (HCC) 01/10/2012   Associated with ventricular fibrillation    Past Surgical History:  Procedure Laterality Date  . CARDIAC CATHETERIZATION  2011   At [facility]  . CARDIAC CATHETERIZATION  12/2011   prox LAD occlusion, ostial RCA occlusion, 30% prox LCx stenosis, LIMA-LAD: 80% post-anastamosis lesion, SVG-Dx2: old occlusion w/ thrombus (noted on prior 2011 cath at [facility]), SVG-Dx: patent, SVG-RCA: patent, diffuse irregs, 80-90% PDA/PLA lesions patent  . CARDIAC CATHETERIZATION N/A 03/15/2015   Procedure: Left Heart Cath and Cors/Grafts Angiography;  Surgeon: Kathleene Hazel, MD;  Location: [facility] INVASIVE CV LAB;  Service: Cardiovascular;  Laterality: N/A;  . CARDIAC CATHETERIZATION N/A 03/15/2015   Procedure: Coronary Stent Intervention;  Surgeon: Kathleene Hazel, MD;  Location: [facility] INVASIVE CV LAB;  Service: Cardiovascular;  Laterality: N/A;  svg to diagnoal 2  . CORONARY ARTERY BYPASS GRAFT  2001  . LEFT HEART CATHETERIZATION WITH CORONARY ANGIOGRAM N/A 01/10/2012   Procedure: LEFT HEART CATHETERIZATION WITH CORONARY ANGIOGRAM;  Surgeon: Lennette Bihari, MD;  Location: [facility] CATH LAB;  Service: Cardiovascular;  Laterality: N/A;    Social History  reports that  he quit smoking about 5 years ago. His smoking use included cigarettes. He has a 45.00 pack-year smoking history. He has never used smokeless tobacco. He reports current alcohol use. He reports current drug use. Drug: Marijuana.  Allergies  Allergen Reactions  . Codeine Nausea And Vomiting    Family History  Problem Relation Age of Onset  . Hyperlipidemia Mother   . Alcohol  abuse Father   . Hyperlipidemia Father   . Heart disease Sister   . Hyperlipidemia Sister   . Heart disease Brother   . Hyperlipidemia Brother   . Hyperlipidemia Sister   . Diabetes Neg Hx   . Hypertension Neg Hx   Reviewed on Admission  Prior to Admission medications   Medication Sig Start Date End Date Taking? Authorizing Provider  aspirin 81 MG chewable tablet Chew 1 tablet (81 mg total) by mouth daily. Patient taking differently: Chew 81 mg by mouth 2 (two) times daily. 01/15/12  Yes Arguello, Roger A, PA-C  atorvastatin (LIPITOR) 80 MG tablet TAKE ONE (1) TABLET BY MOUTH EACH EVENING AT 6PM 05/09/20  Yes Iran OuchArida, Muhammad A, MD  clonazePAM (KLONOPIN) 0.5 MG tablet TAKE 1 TABLET BY MOUTH EVERY DAY AT BEDTIME AS NEEDED 05/30/20  Yes Karie SchwalbeLetvak, Richard I, MD  clopidogrel (PLAVIX) 75 MG tablet TAKE ONE TABLET BY MOUTH EACH MORNING WITH BREAKFAST Patient taking differently: Take 75 mg by mouth daily. TAKE ONE TABLET BY MOUTH EACH MORNING WITH BREAKFAST 05/09/20  Yes Iran OuchArida, Muhammad A, MD  lisinopril (ZESTRIL) 5 MG tablet TAKE 1/2 TABLET BY MOUTH EVERY DAY 01/01/20  Yes Karie SchwalbeLetvak, Richard I, MD  Omega-3 Fatty Acids (FISH OIL) 1000 MG CAPS Take 2 capsules by mouth daily.   Yes [provider]  pantoprazole (PROTONIX) 20 MG tablet TAKE 1 TABLET BY MOUTH EVERY DAY AS NEEDED 08/17/19  Yes Karie SchwalbeLetvak, Richard I, MD    Physical Exam: Vitals:   07/07/20 2212 07/07/20 2214  BP: (!) 146/73   Pulse: (!) 53   Resp: 20   Temp: 97.9 F (36.6 C)   TempSrc: Oral   SpO2: 96%   Weight:  77.1 kg  Height:  5\' 8"  (1.727 m)   Physical Exam Constitutional:      General: He is not in acute distress.    Appearance: Normal appearance.  HENT:     Head: Normocephalic and atraumatic.     Mouth/Throat:     Mouth: Mucous membranes are moist.     Pharynx: Oropharynx is clear.  Eyes:     Extraocular Movements: Extraocular movements intact.     Pupils: Pupils are equal, round, and reactive to light.   Cardiovascular:     Rate and Rhythm: Normal rate and regular rhythm.     Pulses: Normal pulses.     Heart sounds: Normal heart sounds.  Pulmonary:     Effort: Pulmonary effort is normal. No respiratory distress.     Breath sounds: Normal breath sounds.  Abdominal:     General: Bowel sounds are normal. There is no distension.     Palpations: Abdomen is soft.     Tenderness: There is no abdominal tenderness.  Musculoskeletal:        General: No swelling or deformity.  Skin:    General: Skin is warm and dry.  Neurological:     General: No focal deficit present.     Mental Status: Mental status is at baseline.    Labs on Admission: I have personally reviewed following labs and imaging studies  CBC: Recent Labs  Lab 07/07/20 2205  WBC 7.7  HGB 12.4*  HCT 36.9*  MCV 92.5  PLT 187    Basic Metabolic Panel: Recent Labs  Lab 07/07/20 2205  NA 136  K 3.8  CL 106  CO2 22  GLUCOSE 113*  BUN 22  CREATININE 0.90  CALCIUM 8.7*    GFR: Estimated Creatinine Clearance: 74.9 mL/min (by C-G formula based on SCr of 0.9 mg/dL).  Liver Function Tests: No results for input(s): AST, ALT, ALKPHOS, BILITOT, PROT, ALBUMIN in the last 168 hours.  Urine analysis:    Component Value Date/Time   COLORURINE YELLOW (A) 01/21/2020 0851   APPEARANCEUR HAZY (A) 01/21/2020 0851   LABSPEC 1.024 01/21/2020 0851   PHURINE 5.0 01/21/2020 0851   GLUCOSEU NEGATIVE 01/21/2020 0851   GLUCOSEU NEGATIVE 08/27/2016 1112   HGBUR SMALL (A) 01/21/2020 0851   BILIRUBINUR NEGATIVE 01/21/2020 0851   BILIRUBINUR negative 08/13/2016 1035   KETONESUR NEGATIVE 01/21/2020 0851   PROTEINUR NEGATIVE 01/21/2020 0851   UROBILINOGEN 0.2 08/27/2016 1112   NITRITE NEGATIVE 01/21/2020 0851   LEUKOCYTESUR NEGATIVE 01/21/2020 0851    Radiological Exams on Admission: DG Chest 2 View  Result Date: 07/07/2020 CLINICAL DATA:  Intermittent chest pain for 1 hour. Previous history of cardiac surgery. EXAM: CHEST - 2  VIEW COMPARISON:  03/14/2015 FINDINGS: Postoperative changes in the mediastinum. Heart size and pulmonary vascularity are normal. Lungs are clear. No pleural effusions. No pneumothorax. Emphysematous changes in the lungs. Degenerative changes in the spine. Old ununited fracture of the midshaft right clavicle. IMPRESSION: No active cardiopulmonary disease. Electronically Signed   By: Burman Nieves M.D.   On: 07/07/2020 22:28   EKG: Independently reviewed.  Sinus rhythm at 66 bpm.  Right bundle branch block, first-degree AV block.  Similar to previous.  Assessment/Plan Principal Problem:   Unstable angina (HCC) Active Problems:   CAD (coronary artery disease)   PAD (peripheral artery disease) (HCC)   Restless legs syndrome   Hyperlipidemia   Systolic CHF (HCC)   GERD (gastroesophageal reflux disease)  Unstable angina Hyperlipidemia CAD > Significant cardiac history with previous CABG x4 and subsequent stenting after that.  Does have a history of episode of arrest as well. > Currently on goal-directed medical therapy with aspirin, Plavix, atorvastatin, lisinopril.  Not currently on a beta-blocker as he did not tolerate this secondary to fatigue and bradycardia. > EKG and troponin in ED were normal however cardiology recommends observation and further evaluation considering his significant cardiac history and suspicious story. - Appreciate cardiology recommendations - Monitor on telemetry - Trend troponin - EKG in a.m. and as needed - Continue with Imdur per cardiology Recs - Continue home aspirin, Plavix, atorvastatin, lisinopril  PAD - Continue home aspirin Plavix and atorvastatin  Systolic heart failure > History of this previously though last echo was 50-55% with normal diastolic function and low normal RV function. > Not currently on any diuretics - Continue statin  Normocytic anemia > Mild anemia 12.4 on initial CBC - We will recheck CBC in the morning to see if this is  persistent   GERD - Continue PPI  Restless leg syndrome - Continue home Klonopin  DVT prophylaxis: Heparin  Code Status:   Full Family Communication:  None on admission.  Patient states that he does not need his family updated as he has been keeping them informed. Disposition Plan:   Patient is from:  Home  Anticipated DC to:  Home  Anticipated DC date:  1  to 3 days  Anticipated DC barriers: None  Consults called:  Cardiology consulted by EDP, states he will see the patient in the morning and leave further recommendations.   Admission status:  Observation, telemetry   Severity of Illness: The appropriate patient status for this patient is OBSERVATION. Observation status is judged to be reasonable and necessary in order to provide the required intensity of service to ensure the patient's safety. The patient's presenting symptoms, physical exam findings, and initial radiographic and laboratory data in the context of their medical condition is felt to place them at decreased risk for further clinical deterioration. Furthermore, it is anticipated that the patient will be medically stable for discharge from the hospital within 2 midnights of admission. The following factors support the patient status of observation.   " The patient's presenting symptoms include chest pain. " The physical exam findings include stable exam. " The initial radiographic and laboratory data are stable at this time other than hemoglobin of 12.4.   Synetta Fail MD [facility]  How to contact the [facility] Attending or Consulting provider 7A - 7P or covering provider during after hours 7P -7A, for this patient?   1. Check the care team in [facility] and look for a) attending/consulting TR[facility] provider listed and b) the [facility] team listed 2. Log into [email or web address] and use [place]'s universal password to access. If you do not have the password, please contact the hospital operator. 3. Locate the [facility] provider you are  looking for under [facility] and page to a number that you can be directly reached. 4. If you still have difficulty reaching the provider, please page the [facility] (Director on Call) for the Hospitalists listed on amion for assistance.  07/08/2020, 12:09 AM

## 2020-07-07 NOTE — ED Provider Notes (Signed)
[facility] Provider Note   CSN: [id number] Arrival date & time: 07/07/20  2157     History Chief Complaint  Patient presents with  . Chest Pain    Franklin GulaLarry S Marks is a 70 y.o. male.  HPI Patient is a 70 year old male with an extensive medical history presenting with a chief complaint of chest pain.  Patient has had multiple coronary artery bypass grafts with last reversion in 2001.  Additionally he has chronic A. fib, ischemic cardiomyopathy, and an NSTEMI in 2017 that required stent placement.  Patient states today he was working around the house more vigorously than normal and approximately 1 hour prior to arrival he had sudden onset of left-sided chest pain radiating to his right shoulder as well.  Denies any fevers or chills, nausea or vomiting, syncope or shortness of breath but took 4 aspirin and called EMS.  Pain improved by time EMS got there and he required no further therapies in transit. Patient otherwise healthy and up-to-date on his medications.  Chest pain at this time.  Past Medical History:  Diagnosis Date  . Anemia   . Atrial fibrillation (HCC) 01/13/2012  . CAD (coronary artery disease)    s/p CABG 2001  . COPD (chronic obstructive pulmonary disease) (HCC)    ongoing tobacco use  . GERD (gastroesophageal reflux disease)   . Ischemic cardiomyopathy 01/10/2012   LVEF 35-40%, mild conc hypertrophy, servere HK of anteroseptum, borderline RVH  . NSTEMI (non-ST elevated myocardial infarction) (HCC) 01/10/2012   Associated with ventricular fibrillation    Patient Active Problem List   Diagnosis Date Noted  . GERD (gastroesophageal reflux disease) 07/22/2018  . Advance directive discussed with patient 07/14/2017  . Systolic CHF (HCC) 06/20/2015  . Osteoarthrosis involving multiple sites 06/20/2015  . Stented coronary artery   . Hyperlipidemia 04/11/2014  . Routine general medical examination at a health care facility  06/14/2013  . Restless legs syndrome 09/10/2012  . PAD (peripheral artery disease) (HCC) 04/28/2012  . Sinus bradycardia 02/27/2012  . CAD (coronary artery disease) 01/10/2012  . COPD (chronic obstructive pulmonary disease) (HCC) 01/10/2012  . Ischemic cardiomyopathy 01/10/2012    Past Surgical History:  Procedure Laterality Date  . CARDIAC CATHETERIZATION  2011   At [facility]  . CARDIAC CATHETERIZATION  12/2011   prox LAD occlusion, ostial RCA occlusion, 30% prox LCx stenosis, LIMA-LAD: 80% post-anastamosis lesion, SVG-Dx2: old occlusion w/ thrombus (noted on prior 2011 cath [facility]), SVG-Dx: patent, SVG-RCA: patent, diffuse irregs, 80-90% PDA/PLA lesions patent  . CARDIAC CATHETERIZATION N/A 03/15/2015   Procedure: Left Heart Cath and Cors/Grafts Angiography;  Surgeon: Kathleene Hazelhristopher D McAlhany, MD;  [facility];  Service: Cardiovascular;  Laterality: N/A;  . CARDIAC CATHETERIZATION N/A 03/15/2015   Procedure: Coronary Stent Intervention;  Surgeon: Kathleene Hazelhristopher D McAlhany, MD;  Location: [facility] INVASIVE CV LAB;  Service: Cardiovascular;  Laterality: N/A;  svg to diagnoal 2  . CORONARY ARTERY BYPASS GRAFT  2001  . LEFT HEART CATHETERIZATION WITH CORONARY ANGIOGRAM N/A 01/10/2012   Procedure: LEFT HEART CATHETERIZATION WITH CORONARY ANGIOGRAM;  Surgeon: Lennette Biharihomas A Kelly, MD;  [facility];  Service: Cardiovascular;  Laterality: N/A;       Family History  Problem Relation Age of Onset  . Hyperlipidemia Mother   . Alcohol abuse Father   . Hyperlipidemia Father   . Heart disease Sister   . Hyperlipidemia Sister   . Heart disease Brother   . Hyperlipidemia Brother   .  Hyperlipidemia Sister   . Diabetes Neg Hx   . Hypertension Neg Hx     Social History   Tobacco Use  . Smoking status: Former Smoker    Packs/day: 1.00    Years: 45.00    Pack years: 45.00    Types: Cigarettes    Quit date: 03/13/2015    Years since quitting: 5.3  . Smokeless tobacco: Never  Used  Vaping Use  . Vaping Use: Never used  Substance Use Topics  . Alcohol use: Yes    Alcohol/week: 0.0 standard drinks    Comment: really rare beer  . Drug use: Yes    Types: Marijuana    Home Medications Prior to Admission medications   Medication Sig Start Date End Date Taking? Authorizing Provider  aspirin 81 MG chewable tablet Chew 1 tablet (81 mg total) by mouth daily. 01/15/12   Arguello, Roger A, PA-C  atorvastatin (LIPITOR) 80 MG tablet TAKE ONE (1) TABLET BY MOUTH EACH EVENING AT 6PM 05/09/20   Iran Ouch, MD  clonazePAM (KLONOPIN) 0.5 MG tablet TAKE 1 TABLET BY MOUTH EVERY DAY AT BEDTIME AS NEEDED 05/30/20   [name], MD  clopidogrel (PLAVIX) 75 MG tablet TAKE ONE TABLET BY MOUTH EACH MORNING WITH BREAKFAST 05/09/20   Iran Ouch, MD  lisinopril (ZESTRIL) 5 MG tablet TAKE 1/2 TABLET BY MOUTH EVERY DAY 01/01/20   [name], MD  Omega-3 Fatty Acids (FISH OIL) 1000 MG CAPS Take 2 capsules by mouth daily.    [provider]  pantoprazole (PROTONIX) 20 MG tablet TAKE 1 TABLET BY MOUTH EVERY DAY AS NEEDED 08/17/19   [name], MD    Allergies    Codeine  Review of Systems   Review of Systems  Constitutional: Negative for chills and fever.  HENT: Negative for ear pain and sore throat.   Eyes: Negative for pain and visual disturbance.  Respiratory: Negative for cough and shortness of breath.   Cardiovascular: Positive for chest pain. Negative for palpitations.  Gastrointestinal: Negative for abdominal pain and vomiting.  Genitourinary: Negative for dysuria and hematuria.  Musculoskeletal: Negative for arthralgias and back pain.  Skin: Negative for color change and rash.  Neurological: Negative for seizures and syncope.  All other systems reviewed and are negative.   Physical Exam Updated Vital Signs BP (!) 146/73 (BP Location: Right Arm)   Pulse (!) 53   Temp 97.9 F (36.6 C) (Oral)   Resp 20   Ht 5\' 8"  (1.727 m)   Wt  77.1 kg   SpO2 96%   BMI 25.85 kg/m   Physical Exam Vitals and nursing note reviewed.  Constitutional:      Appearance: He is well-developed.  HENT:     Head: Normocephalic and atraumatic.     Nose: No congestion or rhinorrhea.     Mouth/Throat:     Mouth: Mucous membranes are moist.     Pharynx: Oropharynx is clear. No oropharyngeal exudate.  Eyes:     Conjunctiva/sclera: Conjunctivae normal.     Pupils: Pupils are equal, round, and reactive to light.  Cardiovascular:     Rate and Rhythm: Normal rate and regular rhythm.     Heart sounds: No murmur heard.   Pulmonary:     Effort: Pulmonary effort is normal. No respiratory distress.     Breath sounds: Normal breath sounds.  Abdominal:     Palpations: Abdomen is soft.     Tenderness: There is no abdominal tenderness.  Musculoskeletal:        General: No swelling, tenderness, deformity or signs of injury. Normal range of motion.     Cervical back: Neck supple. No rigidity or tenderness.  Skin:    General: Skin is warm and dry.  Neurological:     General: No focal deficit present.     Mental Status: He is alert and oriented to person, place, and time. Mental status is at baseline.     Cranial Nerves: No cranial nerve deficit.     Motor: No weakness.     ED Results / Procedures / Treatments   Labs (all labs ordered are listed, but only abnormal results are displayed) Labs Reviewed  BASIC METABOLIC PANEL - Abnormal; Notable for the following components:      Result Value   Glucose, Bld 113 (*)    Calcium 8.7 (*)    All other components within normal limits  CBC - Abnormal; Notable for the following components:   RBC 3.99 (*)    Hemoglobin 12.4 (*)    HCT 36.9 (*)    All other components within normal limits  TROPONIN I (HIGH SENSITIVITY)  TROPONIN I (HIGH SENSITIVITY)    EKG EKG Interpretation  Date/Time:  Friday July 07 2020 21:58:26 EDT Ventricular Rate:  66 PR Interval:  255 QRS Duration: 150 QT  Interval:  444 QTC Calculation: 466 R Axis:   59 Text Interpretation: Sinus rhythm Prolonged PR interval Right bundle branch block Confirmed by Tilden Fossa [phone]) on 07/07/2020 10:22:20 PM   Radiology DG Chest 2 View  Result Date: 07/07/2020 CLINICAL DATA:  Intermittent chest pain for 1 hour. Previous history of cardiac surgery. EXAM: CHEST - 2 VIEW COMPARISON:  03/14/2015 FINDINGS: Postoperative changes in the mediastinum. Heart size and pulmonary vascularity are normal. Lungs are clear. No pleural effusions. No pneumothorax. Emphysematous changes in the lungs. Degenerative changes in the spine. Old ununited fracture of the midshaft right clavicle. IMPRESSION: No active cardiopulmonary disease. Electronically Signed   By: Burman Nieves M.D.   On: 07/07/2020 22:28    Procedures Procedures  Medications Ordered in ED Medications  isosorbide mononitrate (IMDUR) 24 hr tablet 30 mg (has no administration in time range)    ED Course  I have reviewed the triage vital signs and the nursing notes.  Pertinent labs & imaging results that were available during my care of the patient were reviewed by me and considered in my medical decision making (see chart for details).    MDM Rules/Calculators/A&P                          Medical Decision Making:  Franklin Marks is a 70 y.o. male with an extensive cardiac history, who presented to the ED today with chest pain.  Patient's pain started 1 hour ago and is now resolved.  Based on patient's comorbidities, patient is outside the heart pathway due to extensive disease.  On my initial exam, the pt was now chest pain-free in no acute distress..  Vital signs notable for no acute abnormalities at this time. Reviewed and confirmed nursing documentation for past medical history, family history, social history.   Initial Assessment:  With the patient's presentation of left-sided chest pain, most likely diagnose is musculoskeletal chest pain versus ACS.  Other diagnoses were considered including (but not limited to) pulmonary embolism, community-acquired pneumonia, aortic dissection, pneumothorax, underlying bony abnormality, anemia. These are considered less likely due to history of present  illness and physical exam findings.  In particular, concerning pulmonary embolism: Patient is PERC positive and the they deny malignancy, hormone usage, history of DVT,  or calf tenderness leading to a low risk Wells score.  Initial Plan: 1. Evaluate for ACS with delta troponin and EKG evaluated as below 2. Evaluate for dissection, bony abnormality, or pneumonia with chest x-ray and screening laboratory evaluation including CBC, BMP 3. Further evaluation for pulmonary embolism not indicated at this time based on patient's PERC and Wells score.    Initial Study Results:  EKG was reviewed independently and under the supervision of the attending provider. Rate, rhythm, axis, intervals all examined and without medically relevant abnormality. ST segments without concerns for elevations.   Laboratory . Initial troponin stable  . CBC and BMP without obvious metabolic or inflammatory abnormalities requiring further evaluation  Radiology DG Chest 2 View  Final Result      Final Assessment and Plan:  Is a patient of [facility].  Communicated with his cardiology team for recommendations given his representation of chest pain now completely resolved.  Initial troponin within normal limits.  Discussed case with cardiology who recommended initiation of Imdur given the presence of his chest pain and admission for observation. This is reasonable at this time.  They would likely plan for advanced imaging when able.  Final Clinical Impression(s) / ED Diagnoses Final diagnoses:  Unstable angina pectoris [facility])    Rx / DC Orders ED Discharge Orders    None       Glyn Ade, MD 07/07/20 2332    Tilden Fossa, MD 07/08/20 1049

## 2020-07-08 ENCOUNTER — Encounter (HOSPITAL_COMMUNITY): Payer: Self-pay | Admitting: Internal Medicine

## 2020-07-08 ENCOUNTER — Inpatient Hospital Stay (HOSPITAL_COMMUNITY): Payer: PPO

## 2020-07-08 DIAGNOSIS — Z20822 Contact with and (suspected) exposure to covid-19: Secondary | ICD-10-CM | POA: Diagnosis not present

## 2020-07-08 DIAGNOSIS — I252 Old myocardial infarction: Secondary | ICD-10-CM | POA: Diagnosis not present

## 2020-07-08 DIAGNOSIS — R001 Bradycardia, unspecified: Secondary | ICD-10-CM

## 2020-07-08 DIAGNOSIS — I10 Essential (primary) hypertension: Secondary | ICD-10-CM | POA: Diagnosis not present

## 2020-07-08 DIAGNOSIS — Z79899 Other long term (current) drug therapy: Secondary | ICD-10-CM | POA: Diagnosis not present

## 2020-07-08 DIAGNOSIS — I110 Hypertensive heart disease with heart failure: Secondary | ICD-10-CM | POA: Diagnosis present

## 2020-07-08 DIAGNOSIS — R079 Chest pain, unspecified: Secondary | ICD-10-CM

## 2020-07-08 DIAGNOSIS — D649 Anemia, unspecified: Secondary | ICD-10-CM | POA: Diagnosis present

## 2020-07-08 DIAGNOSIS — Z87891 Personal history of nicotine dependence: Secondary | ICD-10-CM | POA: Diagnosis not present

## 2020-07-08 DIAGNOSIS — J449 Chronic obstructive pulmonary disease, unspecified: Secondary | ICD-10-CM | POA: Diagnosis not present

## 2020-07-08 DIAGNOSIS — Z811 Family history of alcohol abuse and dependence: Secondary | ICD-10-CM | POA: Diagnosis not present

## 2020-07-08 DIAGNOSIS — Z7982 Long term (current) use of aspirin: Secondary | ICD-10-CM | POA: Diagnosis not present

## 2020-07-08 DIAGNOSIS — Z8249 Family history of ischemic heart disease and other diseases of the circulatory system: Secondary | ICD-10-CM | POA: Diagnosis not present

## 2020-07-08 DIAGNOSIS — E785 Hyperlipidemia, unspecified: Secondary | ICD-10-CM | POA: Diagnosis not present

## 2020-07-08 DIAGNOSIS — Z885 Allergy status to narcotic agent status: Secondary | ICD-10-CM | POA: Diagnosis not present

## 2020-07-08 DIAGNOSIS — I361 Nonrheumatic tricuspid (valve) insufficiency: Secondary | ICD-10-CM | POA: Diagnosis not present

## 2020-07-08 DIAGNOSIS — Z83438 Family history of other disorder of lipoprotein metabolism and other lipidemia: Secondary | ICD-10-CM | POA: Diagnosis not present

## 2020-07-08 DIAGNOSIS — Z7902 Long term (current) use of antithrombotics/antiplatelets: Secondary | ICD-10-CM | POA: Diagnosis not present

## 2020-07-08 DIAGNOSIS — I25110 Atherosclerotic heart disease of native coronary artery with unstable angina pectoris: Secondary | ICD-10-CM | POA: Diagnosis not present

## 2020-07-08 DIAGNOSIS — I482 Chronic atrial fibrillation, unspecified: Secondary | ICD-10-CM | POA: Diagnosis not present

## 2020-07-08 DIAGNOSIS — G2581 Restless legs syndrome: Secondary | ICD-10-CM | POA: Diagnosis not present

## 2020-07-08 DIAGNOSIS — Z955 Presence of coronary angioplasty implant and graft: Secondary | ICD-10-CM | POA: Diagnosis not present

## 2020-07-08 DIAGNOSIS — I200 Unstable angina: Secondary | ICD-10-CM | POA: Diagnosis present

## 2020-07-08 DIAGNOSIS — I441 Atrioventricular block, second degree: Secondary | ICD-10-CM | POA: Diagnosis present

## 2020-07-08 DIAGNOSIS — I5022 Chronic systolic (congestive) heart failure: Secondary | ICD-10-CM | POA: Diagnosis not present

## 2020-07-08 DIAGNOSIS — I25710 Atherosclerosis of autologous vein coronary artery bypass graft(s) with unstable angina pectoris: Secondary | ICD-10-CM | POA: Diagnosis not present

## 2020-07-08 DIAGNOSIS — I4510 Unspecified right bundle-branch block: Secondary | ICD-10-CM | POA: Diagnosis not present

## 2020-07-08 DIAGNOSIS — I70203 Unspecified atherosclerosis of native arteries of extremities, bilateral legs: Secondary | ICD-10-CM | POA: Diagnosis present

## 2020-07-08 DIAGNOSIS — Z951 Presence of aortocoronary bypass graft: Secondary | ICD-10-CM | POA: Diagnosis not present

## 2020-07-08 DIAGNOSIS — I255 Ischemic cardiomyopathy: Secondary | ICD-10-CM | POA: Diagnosis not present

## 2020-07-08 DIAGNOSIS — K219 Gastro-esophageal reflux disease without esophagitis: Secondary | ICD-10-CM | POA: Diagnosis not present

## 2020-07-08 LAB — RESP PANEL BY RT-PCR (FLU A&B, COVID) ARPGX2
Influenza A by PCR: NEGATIVE
Influenza B by PCR: NEGATIVE
SARS Coronavirus 2 by RT PCR: NEGATIVE

## 2020-07-08 LAB — LIPID PANEL
Cholesterol: 90 mg/dL (ref 0–200)
HDL: 26 mg/dL — ABNORMAL LOW (ref >40)
LDL Cholesterol: 42 mg/dL (ref 0–99)
Total CHOL/HDL Ratio: 3.5 RATIO
Triglycerides: 108 mg/dL (ref <150)
VLDL: 22 mg/dL (ref 0–40)

## 2020-07-08 LAB — ECHOCARDIOGRAM COMPLETE
Area-P 1/2: 2.08 cm2
Height: 69 in
S' Lateral: 3.3 cm
Weight: 2687.85 oz

## 2020-07-08 LAB — HEPARIN LEVEL (UNFRACTIONATED)
Heparin Unfractionated: 0.19 IU/mL — ABNORMAL LOW (ref 0.30–0.70)
Heparin Unfractionated: 0.28 IU/mL — ABNORMAL LOW (ref 0.30–0.70)

## 2020-07-08 LAB — MRSA PCR SCREENING: MRSA by PCR: NEGATIVE

## 2020-07-08 LAB — CBC
HCT: 36 % — ABNORMAL LOW (ref 39.0–52.0)
Hemoglobin: 12.1 g/dL — ABNORMAL LOW (ref 13.0–17.0)
MCH: 30.8 pg (ref 26.0–34.0)
MCHC: 33.6 g/dL (ref 30.0–36.0)
MCV: 91.6 fL (ref 80.0–100.0)
Platelets: 189 10*3/uL (ref 150–400)
RBC: 3.93 MIL/uL — ABNORMAL LOW (ref 4.22–5.81)
RDW: 13.2 % (ref 11.5–15.5)
WBC: 8.5 10*3/uL (ref 4.0–10.5)
nRBC: 0 % (ref 0.0–0.2)

## 2020-07-08 LAB — TROPONIN I (HIGH SENSITIVITY): Troponin I (High Sensitivity): 10 ng/L (ref <18)

## 2020-07-08 LAB — HEMOGLOBIN A1C
Hgb A1c MFr Bld: 5.9 % — ABNORMAL HIGH (ref 4.8–5.6)
Mean Plasma Glucose: 122.63 mg/dL

## 2020-07-08 LAB — HIV ANTIBODY (ROUTINE TESTING W REFLEX): HIV Screen 4th Generation wRfx: NONREACTIVE

## 2020-07-08 MED ORDER — HEPARIN (PORCINE) 25000 UT/250ML-% IV SOLN
1300.0000 [IU]/h | INTRAVENOUS | Status: DC
  Administered 2020-07-08: 950 [IU]/h via INTRAVENOUS
  Administered 2020-07-09: 1250 [IU]/h via INTRAVENOUS
  Administered 2020-07-10: 1300 [IU]/h via INTRAVENOUS
  Filled 2020-07-08 (×3): qty 250

## 2020-07-08 MED ORDER — HEPARIN BOLUS VIA INFUSION
1500.0000 [IU] | Freq: Once | INTRAVENOUS | Status: AC
  Administered 2020-07-09: 1500 [IU] via INTRAVENOUS
  Filled 2020-07-08: qty 1500

## 2020-07-08 MED ORDER — HEPARIN BOLUS VIA INFUSION
4000.0000 [IU] | Freq: Once | INTRAVENOUS | Status: AC
  Administered 2020-07-08: 4000 [IU] via INTRAVENOUS
  Filled 2020-07-08: qty 4000

## 2020-07-08 MED ORDER — HEPARIN BOLUS VIA INFUSION
1000.0000 [IU] | Freq: Once | INTRAVENOUS | Status: AC
  Administered 2020-07-08: 1000 [IU] via INTRAVENOUS
  Filled 2020-07-08: qty 1000

## 2020-07-08 NOTE — Progress Notes (Signed)
HOSPITAL MEDICINE OVERNIGHT EVENT NOTE    Notified by nursing that patient has been experiencing significantly low heart rates, sometimes dropping into the upper 30s with sleep.  EKG obtained revealing slow atrial fibrillation.  Patient has been completely asymptomatic and has been hemodynamically stable.  Continue to monitor on telemetry.  Marinda Elk  MD [facility]

## 2020-07-08 NOTE — Progress Notes (Signed)
ANTICOAGULATION CONSULT NOTE  Pharmacy Consult for Heparin Indication: chest pain/ACS  Allergies  Allergen Reactions  . Codeine Nausea And Vomiting    Patient Measurements: Height: 5\' 9"  (175.3 cm) Weight: 76.2 kg (167 lb 15.9 oz) IBW/kg (Calculated) : 70.7 Heparin Dosing Weight: 76 kg  Vital Signs: Temp: 98.3 F (36.8 C) (04/23 2300) Temp Source: Oral (04/23 2300) BP: 107/61 (04/23 2300) Pulse Rate: 57 (04/23 2300)  Labs: Recent Labs    07/07/20 2205 07/08/20 0038 07/08/20 0122 07/08/20 1546 07/08/20 2250  HGB 12.4*  --  12.1*  --   --   HCT 36.9*  --  36.0*  --   --   PLT 187  --  189  --   --   HEPARINUNFRC  --   --   --  0.19* 0.28*  CREATININE 0.90  --   --   --   --   TROPONINIHS 10 10  --   --   --     Estimated Creatinine Clearance: 77.5 mL/min (by C-G formula based on SCr of 0.9 mg/dL).   Medical History: Past Medical History:  Diagnosis Date  . Anemia   . Atrial fibrillation (HCC) 01/13/2012  . CAD (coronary artery disease)    s/p CABG 2001  . COPD (chronic obstructive pulmonary disease) (HCC)    ongoing tobacco use  . GERD (gastroesophageal reflux disease)   . Ischemic cardiomyopathy 01/10/2012   LVEF 35-40%, mild conc hypertrophy, servere HK of anteroseptum, borderline RVH  . NSTEMI (non-ST elevated myocardial infarction) (HCC) 01/10/2012   Associated with ventricular fibrillation     Assessment: 70 yo M with unstable angina. Pharmacy asked to start IV heparin. No AC noted PTA. Plans noted for cardiac cath in the future. Hgb low stable 12, pltc 180s. No overt bleeding noted.  Initial heparin level came back subtherapeutic at 0.19, on 950 units/hr. No s/sx of bleeding or infusion issues per RN.  4/23 PM update:  Heparin level just below goal  Goal of Therapy:  Heparin level 0.3-0.7 units/ml Monitor platelets by anticoagulation protocol: Yes   Plan:  Order heparin bolus of 1500 units IV x 1 Increase IV heparin to 1250  units/hr Heparin level with AM labs  5/23, PharmD, BCPS Clinical Pharmacist Phone: [phone]

## 2020-07-08 NOTE — Consult Note (Addendum)
Cardiology Consultation:   Patient ID: Franklin Marks MRN: [id number]; DOB: 03-25-1950  Admit date: 07/07/2020 Date of Consult: 07/08/2020  PCP:  [name], MD   [facility]  Cardiologist:  Lorine Bears, MD  Electrophysiologist:  None   Patient Profile:   Franklin Marks is a 70 y.o. male with a history of CAD s/p remote CABG in 2001 with subsequent DES to SVT to 2nd Diag in 02/2015, ischemic cardiomyopathy/chronic systolic CHF with improved EF of 50-55% in 10/2019, paroxysmal atrial fibrillation not on anticoagulation, symptomatic bradycardia, PAD with CTO of bilateral SFAs with reconstitution distally via collaterals, hypertension, hyperlipidemia, and GERD who is being seen today for the evaluation of chest pain at the request of Dr. Doran Durand.  History of Present Illness:   Franklin Marks is a 70 year old male with the above history who is followed by Dr. Kirke Corin. He has a history of CAD with prior cardiac arrest s/p remote CABG in 2001. He presented with NSTEMI in 02/2015 and ultimately underwent PCI/DES to SVG to 2nd Diag. Also noted to have occluded SVG to 1st Diag at that time but SVG to RCA and LIMA to LAD were patent. EF was 35-45% at that time. Last Echo in 10/2019 showed improved EF of 50-55%. Patient also has known PAD with CTO of bilateral SFAs with reconstitution distally via collaterals. Last ABIs in 05/2016 were stable. He has also had some symptomatic bradycardia in the past and beta-blocker had to be discontinued. He was last seen by Dr. Kirke Corin in 04/2020 at which time he was doing well from a cardiac standpoint. He did describes some stable bilateral calf claudication but this was not lifestyle limiting. He was continue on medical therapy.   Patient presented to the ED on 07/07/2020 with chest pain.  Patient was in his usual state of health until last night when he developed diffuse chest tightness that felt similar to his prior cardiac pain.  It occurred at  rest and lasted for about 15 minutes.  He took 1 dose of baby aspirin and then called 911 and was advised to take 3 more doses.  Pain resolved.  By the time he got to the ED, he was chest pain-free. He had some associated sweating with this but denies any associated nausea, vomiting, shortness of breath.  He had COVID about `1 year ago and has had some dyspnea on exertion since then. However, no shortness of breath at rest. No orthopnea, PND, or edema. No lightheadedness, dizziness, syncope.  No recent fevers or illnesses.  In the ED: EKG showed normal sinus rhythm, rate 66 bpm, with known RBBB and 1st degree AV block but no acute ST/T changes. High-sensitivity troponin negative x2. Chest x-ray showed no acute findings. WBC 7.7, Hgb 12.4, Plts 187. Na 136, K 3.8, Glucose 113, BUN 22, Cr 0.90. Respiratory panel negative for COVID and influenza.   At the time of this evaluation, patient resting comfortably. He is chest pain. He would like to go home.   Patient is bradycardic with rates as low as the 30's to 40's at time. Telemetry shows some Wenckebach.   Past Medical History:  Diagnosis Date  . Anemia   . Atrial fibrillation (HCC) 01/13/2012  . CAD (coronary artery disease)    s/p CABG 2001  . COPD (chronic obstructive pulmonary disease) (HCC)    ongoing tobacco use  . GERD (gastroesophageal reflux disease)   . Ischemic cardiomyopathy 01/10/2012   LVEF 35-40%, mild  conc hypertrophy, servere HK of anteroseptum, borderline RVH  . NSTEMI (non-ST elevated myocardial infarction) (HCC) 01/10/2012   Associated with ventricular fibrillation    Past Surgical History:  Procedure Laterality Date  . CARDIAC CATHETERIZATION  2011   At [facility]  . CARDIAC CATHETERIZATION  12/2011   prox LAD occlusion, ostial RCA occlusion, 30% prox LCx stenosis, LIMA-LAD: 80% post-anastamosis lesion, SVG-Dx2: old occlusion w/ thrombus (noted on prior 2011 cath at [facility]), SVG-Dx: patent, SVG-RCA: patent, diffuse irregs,  80-90% PDA/PLA lesions patent  . CARDIAC CATHETERIZATION N/A 03/15/2015   Procedure: Left Heart Cath and Cors/Grafts Angiography;  Surgeon: Kathleene Hazel, MD;  Location[facility] INVASIVE CV LAB;  Service: Cardiovascular;  Laterality: N/A;  . CARDIAC CATHETERIZATION N/A 03/15/2015   Procedure: Coronary Stent Intervention;  Surgeon: Kathleene Hazel, MD;  Location: [facility] INVASIVE CV LAB;  Service: Cardiovascular;  Laterality: N/A;  svg to diagnoal 2  . CORONARY ARTERY BYPASS GRAFT  2001  . LEFT HEART CATHETERIZATION WITH CORONARY ANGIOGRAM N/A 01/10/2012   Procedure: LEFT HEART CATHETERIZATION WITH CORONARY ANGIOGRAM;  Surgeon: Lennette Bihari, MD;  Location: [facility] CATH LAB;  Service: Cardiovascular;  Laterality: N/A;     Home Medications:  Prior to Admission medications   Medication Sig Start Date End Date Taking? Authorizing Provider  aspirin 81 MG chewable tablet Chew 1 tablet (81 mg total) by mouth daily. Patient taking differently: Chew 81 mg by mouth 2 (two) times daily. 01/15/12  Yes Arguello, Roger A, PA-C  atorvastatin (LIPITOR) 80 MG tablet TAKE ONE (1) TABLET BY MOUTH EACH EVENING AT 6PM Patient taking differently: Take 80 mg by mouth every evening. TAKE ONE (1) TABLET BY MOUTH EACH EVENING AT 6PM 05/09/20  Yes Iran Ouch, MD  clonazePAM (KLONOPIN) 0.5 MG tablet TAKE 1 TABLET BY MOUTH EVERY DAY AT BEDTIME AS NEEDED Patient taking differently: Take 0.5 mg by mouth at bedtime as needed. 05/30/20  Yes [name], MD  clopidogrel (PLAVIX) 75 MG tablet TAKE ONE TABLET BY MOUTH EACH MORNING WITH BREAKFAST Patient taking differently: Take 75 mg by mouth daily. TAKE ONE TABLET BY MOUTH EACH MORNING WITH BREAKFAST 05/09/20  Yes Iran Ouch, MD  lisinopril (ZESTRIL) 5 MG tablet TAKE 1/2 TABLET BY MOUTH EVERY DAY 01/01/20  Yes [name], MD  Omega-3 Fatty Acids (FISH OIL) 1000 MG CAPS Take 2 capsules by mouth daily.   Yes [provider]  pantoprazole  (PROTONIX) 20 MG tablet TAKE 1 TABLET BY MOUTH EVERY DAY AS NEEDED 08/17/19  Yes [name], MD    Inpatient Medications: Scheduled Meds: . aspirin  81 mg Oral Daily  . atorvastatin  80 mg Oral Daily  . clopidogrel  75 mg Oral Daily  . heparin  5,000 Units Subcutaneous Q8H  . isosorbide mononitrate  30 mg Oral Daily  . lisinopril  2.5 mg Oral Daily  . pantoprazole  20 mg Oral Daily   Continuous Infusions:  PRN Meds: acetaminophen, clonazePAM, ondansetron (ZOFRAN) IV  Allergies:    Allergies  Allergen Reactions  . Codeine Nausea And Vomiting    Social History:   Social History   Socioeconomic History  . Marital status: Married    Spouse name: Not on file  . Number of children: 4  . Years of education: Not on file  . Highest education level: Not on file  Occupational History  . Occupation: Management --now disabled  . 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 Smoking status: Former Smoker    Packs/day: 1.00    Years: 45.00    Pack years: 45.00    Types: Cigarettes    Quit date: 03/13/2015    Years since quitting: 5.3  . Smokeless tobacco: Never Used  Vaping Use  . Vaping Use: Never used  Substance and Sexual Activity  . Alcohol use: Yes    Alcohol/week: 0.0 standard drinks    Comment: really rare beer  . Drug use: Yes    Types: Marijuana  . Sexual activity: Not Currently  Other Topics Concern  . Not on file  Social History Narrative   Married twice   3 children from 1st marriage-- 1 from second   Worked as Investment banker, operational      Has living will   Wife has health care POA---alternate would be daughter Judeth Cornfield   Would accept resuscitation--- but no prolonged ventilation   No tube feeds if cognitively unaware   Social Determinants of Health   Financial Resource Strain: Not on file  Food Insecurity: Not on file  Transportation Needs: Not on file  Physical Activity: Not on file  Stress: Not  on file  Social Connections: Not on file  Intimate Partner Violence: Not on file    Family History:    Family History  Problem Relation Age of Onset  . Hyperlipidemia Mother   . Alcohol abuse Father   . Hyperlipidemia Father   . Heart disease Sister   . Hyperlipidemia Sister   . Heart disease Brother   . Hyperlipidemia Brother   . Hyperlipidemia Sister   . Diabetes Neg Hx   . Hypertension Neg Hx      ROS:  Please see the history of present illness.  All other ROS reviewed and negative.     Physical Exam/Data:   Vitals:   07/08/20 0300 07/08/20 0400 07/08/20 0500 07/08/20 0808  BP: (!) 91/50 (!) 94/55 113/65 101/67  Pulse: (!) 53 (!) 46 (!) 54 (!) 48  Resp: Temp:  98 F (36.7 C)  97.6 F (36.4 C)  TempSrc:  Oral  Oral  SpO2: 95% 94% 93% 97%  Weight:      Height:        Intake/Output Summary (Last 24 hours) at 07/08/2020 0839 Last data filed at 07/08/2020 0800 Gross per 24 hour  Intake 0 ml  Output 275 ml  Net -275 ml   Last 3 Weights 07/08/2020 07/07/2020 05/02/2020  Weight (lbs) 167 lb 15.9 oz 170 lb 173 lb  Weight (kg) 76.2 kg 77.111 kg 78.472 kg     Body mass index is 24.81 kg/m.  General: 70 y.o. male resting comfortably in no acute distress. HEENT: Normocephalic and atraumatic. Sclera clear. EOMs  Neck: Supple. No carotid bruits. No JVD. Heart: Bradycardic with normal rhythm. Distinct S1 and S2. No murmurs, gallops, or rubs. Radial pulses 2+ and equal bilaterally. Lungs: No increased work of breathing. Clear to ausculation bilaterally. No wheezes, rhonchi, or rales.  Abdomen: Soft, non-distended, and non-tender to palpation. Bowel sounds present. MSK: Normal strength and tone for age. Extremities: No lower extremity edema.    Skin: Warm and dry. Neuro: Alert and oriented x3. No focal deficits. Psych: Normal affect. Responds appropriately.  EKG:  The EKG was personally reviewed and demonstrates: Normal sinus rhythm, rate 66 bpm, with known  RBBB and 1st degree AV block but no acute ST/T changes.  Telemetry:  Telemetry was  personally reviewed and demonstrates: Sinus rhythm with rates in the 30's to 50's. Intermittent Wenckebach noted and some pauses all less than 3 seconds.   Relevant CV Studies:  Cardiac Catheterization 03/15/2015:  Ost RCA lesion, 100% stenosed.  SVG to RCA was injected and is normal in caliber. There is mild disease in the graft.  Right Post Atrio lesion, 100% stenosed. This branch fills from left to right collaterals.  Mid Cx lesion, 30% stenosed.  Ost LAD lesion, 100% stenosed.  SVG to Diagonal 2 was injected and is normal in caliber. There is an ulcerated 95% stenosis in the proximal body of the graft.  SVG to Diagonal 1 is known to be occluded.  LIMA to LAD was injected and is normal in caliber, and is anatomically normal. The LAD beyond the insertion of the IMA graft has serial 70% lesions, unchanged from last cath.  There is moderate left ventricular systolic dysfunction.   Recommendations: Will continue ASA and Plavix for one year. Continue beta blocker and statin. Likely discharge in am if stable.  _______________  Echocardiogram 10/22/2019: Impressions: 1. Left ventricular ejection fraction, by estimation, is 50 to 55%. The  left ventricle has low normal function. The left ventricle has no regional  wall motion abnormalities. Left ventricular diastolic parameters were  normal.  2. Right ventricular systolic function is low normal. The right  ventricular size is normal. There is mildly elevated pulmonary artery  systolic pressure.  3. The mitral valve is normal in structure. No evidence of mitral valve  regurgitation.  4. The aortic valve is tricuspid. Aortic valve regurgitation is not  visualized.  5. The inferior vena cava is normal in size with <50% respiratory  variability, suggesting right atrial pressure of 8 mmHg.   Laboratory Data:  High Sensitivity Troponin:   Recent  Labs  Lab 07/07/20 2205 07/08/20 0038  TROPONINIHS 10 10     Chemistry Recent Labs  Lab 07/07/20 2205  NA 136  K 3.8  CL 106  CO2 22  GLUCOSE 113*  BUN 22  CREATININE 0.90  CALCIUM 8.7*  GFRNONAA >60  ANIONGAP 8    No results for input(s): PROT, ALBUMIN, AST, ALT, ALKPHOS, BILITOT in the last 168 hours. Hematology Recent Labs  Lab 07/07/20 2205 07/08/20 0122  WBC 7.7 8.5  RBC 3.99* 3.93*  HGB 12.4* 12.1*  HCT 36.9* 36.0*  MCV 92.5 91.6  MCH 31.1 30.8  MCHC 33.6 33.6  RDW 13.2 13.2  PLT 187 189   BNPNo results for input(s): BNP, PROBNP in the last 168 hours.  DDimer No results for input(s): DDIMER in the last 168 hours.   Radiology/Studies:  DG Chest 2 View  Result Date: 07/07/2020 CLINICAL DATA:  Intermittent chest pain for 1 hour. Previous history of cardiac surgery. EXAM: CHEST - 2 VIEW COMPARISON:  03/14/2015 FINDINGS: Postoperative changes in the mediastinum. Heart size and pulmonary vascularity are normal. Lungs are clear. No pleural effusions. No pneumothorax. Emphysematous changes in the lungs. Degenerative changes in the spine. Old ununited fracture of the midshaft right clavicle. IMPRESSION: No active cardiopulmonary disease. Electronically Signed   By: Burman NievesWilliam  Stevens M.D.   On: 07/07/2020 22:28     Assessment and Plan:   Chest Pain History of CAD - Patient presented with a 15 minute episode of chest pain at rest. None since. - S/p remote CABG in 2001 with subsequent PCI/DES to SVG to 2nd Diag in 2016. - EKG showed no acute findings. - High-sensitivity negative x2. -  Currently chest pain free. - Will check Echo. - Continue DAPT with Aspirin and Plavix. - Continue high-intensity statin. - Started on Imdur 30mg  daily by primary team. - Will plan for cath on Monday.  Ischemic Cardiomyopathy Chronic Systolic CHF - Last Echo in 10/2019 showed improved EF of 50-55%. - Appears euvolemic on exam. - Continue Lisinopril 2.5mg  daily. - No  beta-blocker due to bradycardia.  - Continue to monitor to volume status closely.  History of Atrial Fibrillation - Patient has history of atrial fibrillation listed in chart. However, I do not see this mentioned in recent Cardiology note. Patient does states he has been told he had paroxysmal atrial fibrillation in the past a long time ago. He has not been on anticoagulation. - No evidence of atrial fibrillation here.  - Continue to monitor for atrial fibrillation on telemetry.  Bradycardia Mobitz 1 2nd Degree AV Block (Wenckebach) - Rates down in the 30's to 40's at times.  - Not on any AV nodal agents.  - Continue to monitor on telemetry.  PAD - Known CTO of bilateral SFAs with reconstitution distally via collaterals.  - Last ABIs in 05/2016 were stable.   - Stable claudication.  - Followed by Dr. 06/2016.  Hypertension - BP well controlled.  - Continue home Lisinopril 2.5mg  daily.  Hyperlipidemia - Continue Lipitor 80mg  daily.   Risk Assessment/Risk Scores:  Kirke Corin   HEAR Score (for undifferentiated chest pain):  HEAR Score: 6{   New York Heart Association (NYHA) Functional Class NYHA Class II    For questions or updates, please contact [facility] Please consult [email or web address] for contact info under    Signed, , PA-C  07/08/2020 8:39 AM As above, patient seen and examined.  Briefly he is a 69 year old male with past medical history of coronary artery disease status post coronary artery bypass graft, ischemic cardiomyopathy improved, paroxysmal atrial fibrillation, history of bradycardia, peripheral vascular disease, hypertension, hyperlipidemia for evaluation of unstable angina.  Patient is status post coronary artery bypass graft in 2001.  He has had PCI most recently in December 2016.  He typically denies dyspnea on exertion, orthopnea, PND, pedal edema, exertional chest pain or syncope.  Last evening while sitting on his couch he developed chest  tightness that was diffuse in nature without radiation.  There was associated diaphoresis but no nausea or dyspnea.  He took aspirin which ultimately relieved his pain.  Total duration was approximately 20 minutes.  No pain since that time.  Pain was not pleuritic.  Cardiology now asked to evaluate.  Troponins are normal.  Electrocardiogram shows NSR, first-degree AV block, right bundle branch block but no ST changes.  Telemetry reviewed and shows sinus rhythm with intermittent bradycardia and Mobitz 1 second-degree AV block.  1 unstable angina-patient presents with an episode of chest pain at rest similar to his previous cardiac pain.  Presently pain-free.  Troponins are normal and electrocardiogram without acute ST changes.  His grafts are greater than 19 years old.  We will plan cardiac catheterization for definitive evaluation.  The risk and benefits including myocardial infarction, CVA and death discussed and he agrees to proceed.  We will continue aspirin, Plavix and statin.  No beta-blocker given baseline bradycardia/conduction disease.  Add IV heparin.  Schedule echocardiogram to assess LV function.  2 bradycardia-patient noted to have significant bradycardia on telemetry with heart rates occasionally in the high 30s and 40s.  Also noted to have intermittent Mobitz 1 second-degree AV block.  However no  symptoms.  Avoid AV nodal blocking agents.  May require pacemaker in the future though no indication at present.  3 hyperlipidemia-continue statin.  4 hypertension-continue present medications and follow.  5 peripheral vascular disease-patient with known occluded SFA bilaterally.  Continue medical therapy.  Follow-up Dr. Kirke Corin.  6 question history of atrial fibrillation-this apparently was remote and I have no documentation.  We will not anticoagulate at this point.  Follow-up Dr. Kirke Corin.  Olga Millers, MD

## 2020-07-08 NOTE — Progress Notes (Signed)
Pt transferred from the ED around 0115, admitted to Rm/2C11. Pt comes from home with spouse. He is alert and oriented x4. No skin breakdown noted. Placed on telemetry, currently SB. States his HR is normally in the 50s. Oriented to room, instructed to call for assistance before getting out of bed. Resting at this time, no complaints of pain will continue to monitor  0230 - Pt heart rate noted to intermittently drop into the upper 30s (non-sustained) while resting. Upon assessment pt states he has no chest pain or SOB, no complaints during these events. Called to notify o/c provider [facility]), instructed to obtain another 12 lead EKG if HR drops below 40 and continue to monitor.

## 2020-07-08 NOTE — Progress Notes (Signed)
  Echocardiogram 2D Echocardiogram has been performed.  Delcie Roch 07/08/2020, 11:46 AM

## 2020-07-08 NOTE — Progress Notes (Addendum)
ANTICOAGULATION CONSULT NOTE  Pharmacy Consult for Heparin Indication: chest pain/ACS  Allergies  Allergen Reactions  . Codeine Nausea And Vomiting    Patient Measurements: Height: 5\' 9"  (175.3 cm) Weight: 76.2 kg (167 lb 15.9 oz) IBW/kg (Calculated) : 70.7 Heparin Dosing Weight: 76 kg  Vital Signs: Temp: 98.2 F (36.8 C) (04/23 1226) Temp Source: Oral (04/23 1226) BP: 105/73 (04/23 1226) Pulse Rate: 62 (04/23 1226)  Labs: Recent Labs    07/07/20 2205 07/08/20 0038 07/08/20 0122 07/08/20 1546  HGB 12.4*  --  12.1*  --   HCT 36.9*  --  36.0*  --   PLT 187  --  189  --   HEPARINUNFRC  --   --   --  0.19*  CREATININE 0.90  --   --   --   TROPONINIHS 10 10  --   --     Estimated Creatinine Clearance: 77.5 mL/min (by C-G formula based on SCr of 0.9 mg/dL).   Medical History: Past Medical History:  Diagnosis Date  . Anemia   . Atrial fibrillation (HCC) 01/13/2012  . CAD (coronary artery disease)    s/p CABG 2001  . COPD (chronic obstructive pulmonary disease) (HCC)    ongoing tobacco use  . GERD (gastroesophageal reflux disease)   . Ischemic cardiomyopathy 01/10/2012   LVEF 35-40%, mild conc hypertrophy, servere HK of anteroseptum, borderline RVH  . NSTEMI (non-ST elevated myocardial infarction) (HCC) 01/10/2012   Associated with ventricular fibrillation     Assessment: 70 yo M with unstable angina. Pharmacy asked to start IV heparin. No AC noted PTA. Plans noted for cardiac cath in the future. Hgb low stable 12, pltc 180s. No overt bleeding noted.  Initial heparin level came back subtherapeutic at 0.19, on 950 units/hr. No s/sx of bleeding or infusion issues per RN.  Goal of Therapy:  Heparin level 0.3-0.7 units/ml Monitor platelets by anticoagulation protocol: Yes   Plan:  Order heparin bolus of 1000 units Increase IV heparin to 1150 units/hr Check 6hr HL Daily HL, CBC, s/sx bleeding  78, PharmD, BCCCP Clinical Pharmacist  Phone:  [phone] 07/08/2020 4:49 PM  Please check AMION for all [facility] Pharmacy phone numbers After 10:00 PM, call Main Pharmacy [phone]

## 2020-07-08 NOTE — Progress Notes (Signed)
Progress Note    Franklin Marks  ALP:[id number] DOB: 07/23/1950  DOA: 07/07/2020 PCP: [name], MD    Brief Narrative:     Medical records reviewed and are as summarized below:  Franklin Marks is an 70 y.o. male with medical history significant of CAD status post CABG and stent, COPD, GERD, PAD, systolic heart failure, RLS who presents after an episode of chest pain.  Patient was working around the house helping his daughter paint which is a higher level of activity been normal for him.  He began having a episode of chest pain about 1 hour prior to arrival to the ED.  PLan for [facility] on Monday.  Assessment/Plan:   Principal Problem:   Unstable angina (HCC) Active Problems:   CAD (coronary artery disease)   PAD (peripheral artery disease) (HCC)   Restless legs syndrome   Hyperlipidemia   Systolic CHF (HCC)   GERD (gastroesophageal reflux disease)   Unstable angina Hyperlipidemia CAD > Significant cardiac history with previous CABG x4 and subsequent stenting after that.  Does have a history of episode of arrest as well. > Currently on goal-directed medical therapy with aspirin, Plavix, atorvastatin, lisinopril.  Not currently on a beta-blocker as he did not tolerate this secondary to fatigue and bradycardia. > EKG and troponin in ED were normal however cardiology recommends observation and further evaluation considering his significant cardiac history and suspicious story. - cardiology - plan for [facility] on Monday -heparin gtt  Bradycardia -defer to cards  PAD - Continue home aspirin Plavix and atorvastatin  Systolic heart failure > History of this previously though last echo was 50-55% with normal diastolic function and low normal RV function. > Not currently on any diuretics - Continue statin  Normocytic anemia > Mild anemia 12.4 on initial CBC -defer to outpatient  GERD - Continue PPI  Restless leg syndrome - Continue home Klonopin    Family  Communication/Anticipated D/C date and plan/Code Status   DVT prophylaxis: heparin Code Status: Full Code.  Disposition Plan: Status is: Observation  The patient will require care spanning > 2 midnights and should be moved to inpatient because: Ongoing diagnostic testing needed not appropriate for outpatient work up  Dispo: The patient is from: Home              Anticipated d/c is to: Home              Patient currently is not medically stable to d/c.   Difficult to place patient No         Medical Consultants:    cards  Subjective:   No symptoms from low HR  Objective:    Vitals:   07/08/20 0300 07/08/20 0400 07/08/20 0500 07/08/20 0808  BP: (!) 91/50 (!) 94/55 113/65 101/67  Pulse: (!) 53 (!) 46 (!) 54 (!) 48  Resp: 19 17 16 14   Temp:  98 F (36.7 C)  97.6 F (36.4 C)  TempSrc:  Oral  Oral  SpO2: 95% 94% 93% 97%  Weight:      Height:        Intake/Output Summary (Last 24 hours) at 07/08/2020 07/10/2020 Last data filed at 07/08/2020 0800 Gross per 24 hour  Intake 0 ml  Output 275 ml  Net -275 ml   Filed Weights   07/07/20 2214 07/08/20 0120  Weight: 77.1 kg 76.2 kg    Exam: General: Appearance:    Well appearing male in no acute distress  Lungs:     respirations unlabored  Heart:    Bradycardic. Normal rhythm. No murmurs, rubs, or gallops.   MS:   All extremities are intact.   Neurologic:   Awake, alert, oriented x 3. No apparent focal neurological           defect.     Data Reviewed:   I have personally reviewed following labs and imaging studies:  Labs: Labs show the following:   Basic Metabolic Panel: Recent Labs  Lab 07/07/20 2205  NA 136  K 3.8  CL 106  CO2 22  GLUCOSE 113*  BUN 22  CREATININE 0.90  CALCIUM 8.7*   GFR Estimated Creatinine Clearance: 77.5 mL/min (by C-G formula based on SCr of 0.9 mg/dL). Liver Function Tests: No results for input(s): AST, ALT, ALKPHOS, BILITOT, PROT, ALBUMIN in the last 168 hours. No results  for input(s): LIPASE, AMYLASE in the last 168 hours. No results for input(s): AMMONIA in the last 168 hours. Coagulation profile No results for input(s): INR, PROTIME in the last 168 hours.  CBC: Recent Labs  Lab 07/07/20 2205 07/08/20 0122  WBC 7.7 8.5  HGB 12.4* 12.1*  HCT 36.9* 36.0*  MCV 92.5 91.6  PLT 187 189   Cardiac Enzymes: No results for input(s): CKTOTAL, CKMB, CKMBINDEX, TROPONINI in the last 168 hours. BNP (last 3 results) No results for input(s): PROBNP in the last 8760 hours. CBG: No results for input(s): GLUCAP in the last 168 hours. D-Dimer: No results for input(s): DDIMER in the last 72 hours. Hgb A1c: No results for input(s): HGBA1C in the last 72 hours. Lipid Profile: No results for input(s): CHOL, HDL, LDLCALC, TRIG, CHOLHDL, LDLDIRECT in the last 72 hours. Thyroid function studies: No results for input(s): TSH, T4TOTAL, T3FREE, THYROIDAB in the last 72 hours.  Invalid input(s): FREET3 Anemia work up: No results for input(s): VITAMINB12, FOLATE, FERRITIN, TIBC, IRON, RETICCTPCT in the last 72 hours. Sepsis Labs: Recent Labs  Lab 07/07/20 2205 07/08/20 0122  WBC 7.7 8.5    Microbiology Recent Results (from the past 240 hour(s))  Resp Panel by RT-PCR (Flu A&B, Covid) Nasopharyngeal Swab     Status: None   Collection Time: 07/07/20 10:05 PM   Specimen: Nasopharyngeal Swab; Nasopharyngeal(NP) swabs in vial transport medium  Result Value Ref Range Status   SARS Coronavirus 2 by RT PCR NEGATIVE NEGATIVE Final    Comment: (NOTE) SARS-CoV-2 target nucleic acids are NOT DETECTED.  The SARS-CoV-2 RNA is generally detectable in upper respiratory specimens during the acute phase of infection. The lowest concentration of SARS-CoV-2 viral copies this assay can detect is 138 copies/mL. A negative result does not preclude SARS-Cov-2 infection and should not be used as the sole basis for treatment or other patient management decisions. A negative result  may occur with  improper specimen collection/handling, submission of specimen other than nasopharyngeal swab, presence of viral mutation(s) within the areas targeted by this assay, and inadequate number of viral copies(<138 copies/mL). A negative result must be combined with clinical observations, patient history, and epidemiological information. The expected result is Negative.  Fact Sheet for Patients:  BloggerCourse.com  Fact Sheet for Healthcare Providers:  SeriousBroker.it  This test is no t yet approved or cleared by the [place] FDA and  has been authorized for detection and/or diagnosis of SARS-CoV-2 by FDA under an Emergency Use Authorization (EUA). This EUA will remain  in effect (meaning this test can be used) for the duration of the COVID-19 declaration under  Section 564(b)(1) of the Act, 21 U.S.C.section 360bbb-3(b)(1), unless the authorization is terminated  or revoked sooner.       Influenza A by PCR NEGATIVE NEGATIVE Final   Influenza B by PCR NEGATIVE NEGATIVE Final    Comment: (NOTE) The Xpert Xpress SARS-CoV-2/FLU/RSV plus assay is intended as an aid in the diagnosis of influenza from Nasopharyngeal swab specimens and should not be used as a sole basis for treatment. Nasal washings and aspirates are unacceptable for Xpert Xpress SARS-CoV-2/FLU/RSV testing.  Fact Sheet for Patients: BloggerCourse.com  Fact Sheet for Healthcare Providers: SeriousBroker.it  This test is not yet approved or cleared by the [place] FDA and has been authorized for detection and/or diagnosis of SARS-CoV-2 by FDA under an Emergency Use Authorization (EUA). This EUA will remain in effect (meaning this test can be used) for the duration of the COVID-19 declaration under Section 564(b)(1) of the Act, 21 U.S.C. section 360bbb-3(b)(1), unless the authorization is terminated  or revoked.  Performed at [facility], [address] [place], [place] [address]   MRSA PCR Screening     Status: None   Collection Time: 07/08/20  3:00 AM   Specimen: Nasal Mucosa; Nasopharyngeal  Result Value Ref Range Status   MRSA by PCR NEGATIVE NEGATIVE Final    Comment:        The GeneXpert MRSA Assay (FDA approved for NASAL specimens only), is one component of a comprehensive MRSA colonization surveillance program. It is not intended to diagnose MRSA infection nor to guide or monitor treatment for MRSA infections. Performed at [facility], [address], [place], [place] [address]     Procedures and diagnostic studies:  DG Chest 2 View  Result Date: 07/07/2020 CLINICAL DATA:  Intermittent chest pain for 1 hour. Previous history of cardiac surgery. EXAM: CHEST - 2 VIEW COMPARISON:  03/14/2015 FINDINGS: Postoperative changes in the mediastinum. Heart size and pulmonary vascularity are normal. Lungs are clear. No pleural effusions. No pneumothorax. Emphysematous changes in the lungs. Degenerative changes in the spine. Old ununited fracture of the midshaft right clavicle. IMPRESSION: No active cardiopulmonary disease. Electronically Signed   By: Burman Nieves M.D.   On: 07/07/2020 22:28    Medications:   . aspirin  81 mg Oral Daily  . atorvastatin  80 mg Oral Daily  . clopidogrel  75 mg Oral Daily  . heparin  4,000 Units Intravenous Once  . isosorbide mononitrate  30 mg Oral Daily  . lisinopril  2.5 mg Oral Daily  . pantoprazole  20 mg Oral Daily   Continuous Infusions: . heparin       LOS: 0 days   Joseph Art  [facility]   How to contact the [facility] Attending or Consulting provider 7A - 7P or covering provider during after hours 7P -7A, for this patient?  1. Check the care team in [facility] and look for a) attending/consulting [facility] provider listed and b) the [facility] team listed 2. Log into [email or web address] and use [place]'s universal  password to access. If you do not have the password, please contact the hospital operator. 3. Locate the [facility] provider you are looking for under [facility] and page to a number that you can be directly reached. 4. If you still have difficulty reaching the provider, please page the [facility] (Director on Call) for the Hospitalists listed on amion for assistance.  07/08/2020, 9:21 AM

## 2020-07-08 NOTE — Progress Notes (Signed)
ANTICOAGULATION CONSULT NOTE - Initial Consult  Pharmacy Consult for Heparin Indication: chest pain/ACS  Allergies  Allergen Reactions  . Codeine Nausea And Vomiting    Patient Measurements: Height: 5\' 9"  (175.3 cm) Weight: 76.2 kg (167 lb 15.9 oz) IBW/kg (Calculated) : 70.7 Heparin Dosing Weight: 76 kg  Vital Signs: Temp: 97.6 F (36.4 C) (04/23 0808) Temp Source: Oral (04/23 0808) BP: 101/67 (04/23 0808) Pulse Rate: 48 (04/23 0808)  Labs: Recent Labs    07/07/20 2205 07/08/20 0038 07/08/20 0122  HGB 12.4*  --  12.1*  HCT 36.9*  --  36.0*  PLT 187  --  189  CREATININE 0.90  --   --   TROPONINIHS 10 10  --     Estimated Creatinine Clearance: 77.5 mL/min (by C-G formula based on SCr of 0.9 mg/dL).   Medical History: Past Medical History:  Diagnosis Date  . Anemia   . Atrial fibrillation (HCC) 01/13/2012  . CAD (coronary artery disease)    s/p CABG 2001  . COPD (chronic obstructive pulmonary disease) (HCC)    ongoing tobacco use  . GERD (gastroesophageal reflux disease)   . Ischemic cardiomyopathy 01/10/2012   LVEF 35-40%, mild conc hypertrophy, servere HK of anteroseptum, borderline RVH  . NSTEMI (non-ST elevated myocardial infarction) (HCC) 01/10/2012   Associated with ventricular fibrillation     Assessment: 70 yo M with unstable angina. Pharmacy asked to start IV heparin. No AC noted PTA. Plans noted for cardiac cath in the future. Hgb low stable 12, pltc 180s. No overt bleeding noted.  Goal of Therapy:  Heparin level 0.3-0.7 units/ml Monitor platelets by anticoagulation protocol: Yes   Plan:  Give IV heparin 4000 units once Start IV heparin at 950 units/hr Check 6hr HL Daily HL, CBC, s/sx bleeding  78, PharmD, BCPS PGY2 Cardiology Pharmacy Resident Phone: [phone] 07/08/2020  9:05 AM  Please check [email or web address] for unit-specific pharmacy phone numbers.

## 2020-07-09 LAB — BASIC METABOLIC PANEL
Anion gap: 7 (ref 5–15)
BUN: 14 mg/dL (ref 8–23)
CO2: 22 mmol/L (ref 22–32)
Calcium: 8.8 mg/dL — ABNORMAL LOW (ref 8.9–10.3)
Chloride: 107 mmol/L (ref 98–111)
Creatinine, Ser: 0.85 mg/dL (ref 0.61–1.24)
GFR, Estimated: >60 mL/min (ref >60)
Glucose, Bld: 109 mg/dL — ABNORMAL HIGH (ref 70–99)
Potassium: 4.1 mmol/L (ref 3.5–5.1)
Sodium: 136 mmol/L (ref 135–145)

## 2020-07-09 LAB — CBC
HCT: 37.2 % — ABNORMAL LOW (ref 39.0–52.0)
Hemoglobin: 12.5 g/dL — ABNORMAL LOW (ref 13.0–17.0)
MCH: 30.7 pg (ref 26.0–34.0)
MCHC: 33.6 g/dL (ref 30.0–36.0)
MCV: 91.4 fL (ref 80.0–100.0)
Platelets: 172 10*3/uL (ref 150–400)
RBC: 4.07 MIL/uL — ABNORMAL LOW (ref 4.22–5.81)
RDW: 13 % (ref 11.5–15.5)
WBC: 7.9 10*3/uL (ref 4.0–10.5)
nRBC: 0 % (ref 0.0–0.2)

## 2020-07-09 LAB — HEPARIN LEVEL (UNFRACTIONATED): Heparin Unfractionated: 0.36 IU/mL (ref 0.30–0.70)

## 2020-07-09 MED ORDER — CLONAZEPAM 0.5 MG PO TABS
1.0000 mg | ORAL_TABLET | Freq: Every evening | ORAL | Status: DC | PRN
  Administered 2020-07-09 – 2020-07-10 (×2): 1 mg via ORAL
  Filled 2020-07-09 (×3): qty 2

## 2020-07-09 MED ORDER — CLONAZEPAM 0.5 MG PO TABS
0.5000 mg | ORAL_TABLET | Freq: Every day | ORAL | Status: DC
  Administered 2020-07-09 – 2020-07-11 (×3): 0.5 mg via ORAL
  Filled 2020-07-09 (×2): qty 1

## 2020-07-09 NOTE — Progress Notes (Signed)
Progress Note    Franklin Marks  UJW:[id number] DOB: Sep 12, 1950  DOA: 07/07/2020 PCP: [name], MD    Brief Narrative:     Medical records reviewed and are as summarized below:  Franklin Marks is an 70 y.o. male with medical history significant of CAD status post CABG and stent, COPD, GERD, PAD, systolic heart failure, RLS who presents after an episode of chest pain.  Patient was working around the house helping his daughter paint which is a higher level of activity been normal for him.  He began having a episode of chest pain about 1 hour prior to arrival to the ED.  PLan for [facility] on Monday.  Assessment/Plan:   Principal Problem:   Unstable angina (HCC) Active Problems:   CAD (coronary artery disease)   PAD (peripheral artery disease) (HCC)   Restless legs syndrome   Hyperlipidemia   Systolic CHF (HCC)   GERD (gastroesophageal reflux disease)   Unstable angina Hyperlipidemia CAD > Significant cardiac history with previous CABG x4 and subsequent stenting after that.  Does have a history of episode of arrest as well. > Currently on goal-directed medical therapy with aspirin, Plavix, atorvastatin, lisinopril.  Not currently on a beta-blocker as he did not tolerate this secondary to fatigue and bradycardia. > EKG and troponin in ED were normal however cardiology recommends observation and further evaluation considering his significant cardiac history and suspicious story. - cardiology - plan for [facility] on Monday -heparin gtt  Bradycardia -defer to cards  PAD - Continue home aspirin Plavix and atorvastatin  Systolic heart failure > History of this previously though last echo was 50-55% with normal diastolic function and low normal RV function. > Not currently on any diuretics - Continue statin  Normocytic anemia > Mild anemia 12.4 on initial CBC -defer to outpatient  GERD - Continue PPI  Restless leg syndrome - Continue home  Klonopin  Insomnia -increased QHS klonopin   Family Communication/Anticipated D/C date and plan/Code Status   DVT prophylaxis: heparin Code Status: Full Code.  Disposition Plan: Status is: inpt  The patient will require care spanning > 2 midnights and should be moved to inpatient because: Ongoing diagnostic testing needed not appropriate for outpatient work up  Dispo: The patient is from: Home              Anticipated d/c is to: Home              Patient currently is not medically stable to d/c.   Difficult to place patient No         Medical Consultants:    cards  Subjective:   C/o being tired  Objective:    Vitals:   07/09/20 0800 07/09/20 1000 07/09/20 1100 07/09/20 1240  BP: (!) 161/76 (!) 118/47 119/62 107/68  Pulse: (!) 56 (!) 48 (!) 49 63  Resp: Temp:      TempSrc:      SpO2: 95% 94% 94% 94%  Weight:      Height:        Intake/Output Summary (Last 24 hours) at 07/09/2020 1302 Last data filed at 07/09/2020 1200 Gross per 24 hour  Intake 1264.05 ml  Output 780 ml  Net 484.05 ml   Filed Weights   07/07/20 2214 07/08/20 0120  Weight: 77.1 kg 76.2 kg    Exam:  General: Appearance:    Well developed, well nourished male in no acute distress  Lungs:     Clear to auscultation bilaterally, respirations unlabored  Heart:    Normal heart rate. Normal rhythm. No murmurs, rubs, or gallops.       Data Reviewed:   I have personally reviewed following labs and imaging studies:  Labs: Labs show the following:   Basic Metabolic Panel: Recent Labs  Lab 07/07/20 2205 07/09/20 0555  NA 136 136  K 3.8 4.1  CL 106 107  CO2 22 22  GLUCOSE 113* 109*  BUN 22 14  CREATININE 0.90 0.85  CALCIUM 8.7* 8.8*   GFR Estimated Creatinine Clearance: 82 mL/min (by C-G formula based on SCr of 0.85 mg/dL). Liver Function Tests: No results for input(s): AST, ALT, ALKPHOS, BILITOT, PROT, ALBUMIN in the last 168 hours. No results for input(s):  LIPASE, AMYLASE in the last 168 hours. No results for input(s): AMMONIA in the last 168 hours. Coagulation profile No results for input(s): INR, PROTIME in the last 168 hours.  CBC: Recent Labs  Lab 07/07/20 2205 07/08/20 0122 07/09/20 0555  WBC 7.7 8.5 7.9  HGB 12.4* 12.1* 12.5*  HCT 36.9* 36.0* 37.2*  MCV 92.5 91.6 91.4  PLT 187 189 172   Cardiac Enzymes: No results for input(s): CKTOTAL, CKMB, CKMBINDEX, TROPONINI in the last 168 hours. BNP (last 3 results) No results for input(s): PROBNP in the last 8760 hours. CBG: No results for input(s): GLUCAP in the last 168 hours. D-Dimer: No results for input(s): DDIMER in the last 72 hours. Hgb A1c: Recent Labs    07/08/20 0122  HGBA1C 5.9*   Lipid Profile: Recent Labs    07/08/20 0124  CHOL 90  HDL 26*  LDLCALC 42  TRIG 782  CHOLHDL 3.5   Thyroid function studies: No results for input(s): TSH, T4TOTAL, T3FREE, THYROIDAB in the last 72 hours.  Invalid input(s): FREET3 Anemia work up: No results for input(s): VITAMINB12, FOLATE, FERRITIN, TIBC, IRON, RETICCTPCT in the last 72 hours. Sepsis Labs: Recent Labs  Lab 07/07/20 2205 07/08/20 0122 07/09/20 0555  WBC 7.7 8.5 7.9    Microbiology Recent Results (from the past 240 hour(s))  Resp Panel by RT-PCR (Flu A&B, Covid) Nasopharyngeal Swab     Status: None   Collection Time: 07/07/20 10:05 PM   Specimen: Nasopharyngeal Swab; Nasopharyngeal(NP) swabs in vial transport medium  Result Value Ref Range Status   SARS Coronavirus 2 by RT PCR NEGATIVE NEGATIVE Final    Comment: (NOTE) SARS-CoV-2 target nucleic acids are NOT DETECTED.  The SARS-CoV-2 RNA is generally detectable in upper respiratory specimens during the acute phase of infection. The lowest concentration of SARS-CoV-2 viral copies this assay can detect is 138 copies/mL. A negative result does not preclude SARS-Cov-2 infection and should not be used as the sole basis for treatment or other patient  management decisions. A negative result may occur with  improper specimen collection/handling, submission of specimen other than nasopharyngeal swab, presence of viral mutation(s) within the areas targeted by this assay, and inadequate number of viral copies(<138 copies/mL). A negative result must be combined with clinical observations, patient history, and epidemiological information. The expected result is Negative.  Fact Sheet for Patients:  BloggerCourse.com  Fact Sheet for Healthcare Providers:  SeriousBroker.it  This test is no t yet approved or cleared by the [place] FDA and  has been authorized for detection and/or diagnosis of SARS-CoV-2 by FDA under an Emergency Use Authorization (EUA). This EUA will remain  in effect (meaning this test can be used) for the duration  of the COVID-19 declaration under Section 564(b)(1) of the Act, 21 U.S.C.section 360bbb-3(b)(1), unless the authorization is terminated  or revoked sooner.       Influenza A by PCR NEGATIVE NEGATIVE Final   Influenza B by PCR NEGATIVE NEGATIVE Final    Comment: (NOTE) The Xpert Xpress SARS-CoV-2/FLU/RSV plus assay is intended as an aid in the diagnosis of influenza from Nasopharyngeal swab specimens and should not be used as a sole basis for treatment. Nasal washings and aspirates are unacceptable for Xpert Xpress SARS-CoV-2/FLU/RSV testing.  Fact Sheet for Patients: BloggerCourse.com  Fact Sheet for Healthcare Providers: SeriousBroker.it  This test is not yet approved or cleared by the [place] FDA and has been authorized for detection and/or diagnosis of SARS-CoV-2 by FDA under an Emergency Use Authorization (EUA). This EUA will remain in effect (meaning this test can be used) for the duration of the COVID-19 declaration under Section 564(b)(1) of the Act, 21 U.S.C. section 360bbb-3(b)(1),  unless the authorization is terminated or revoked.  Performed at [facility], [address] [place], [place] [address]   MRSA PCR Screening     Status: None   Collection Time: 07/08/20  3:00 AM   Specimen: Nasal Mucosa; Nasopharyngeal  Result Value Ref Range Status   MRSA by PCR NEGATIVE NEGATIVE Final    Comment:        The GeneXpert MRSA Assay (FDA approved for NASAL specimens only), is one component of a comprehensive MRSA colonization surveillance program. It is not intended to diagnose MRSA infection nor to guide or monitor treatment for MRSA infections. Performed at [facility], [address], [place], [place] [address]     Procedures and diagnostic studies:  DG Chest 2 View  Result Date: 07/07/2020 CLINICAL DATA:  Intermittent chest pain for 1 hour. Previous history of cardiac surgery. EXAM: CHEST - 2 VIEW COMPARISON:  03/14/2015 FINDINGS: Postoperative changes in the mediastinum. Heart size and pulmonary vascularity are normal. Lungs are clear. No pleural effusions. No pneumothorax. Emphysematous changes in the lungs. Degenerative changes in the spine. Old ununited fracture of the midshaft right clavicle. IMPRESSION: No active cardiopulmonary disease. Electronically Signed   By: Burman Nieves M.D.   On: 07/07/2020 22:28   ECHOCARDIOGRAM COMPLETE  Result Date: 07/08/2020    ECHOCARDIOGRAM REPORT   Patient Name:   ARMARION GREEK Date of Exam: 07/08/2020 Medical Rec #:  [id number]     Height:       69.0 in Accession #:    [id number]    Weight:       168.0 lb Date of Birth:  12-15-1950    BSA:          1.918 m Patient Age:    69 years      BP:           101/67 mmHg Patient Gender: M             HR:           46 bpm. Exam Location:  Inpatient Procedure: 2D Echo Indications:    chest pain  History:        Patient has prior history of Echocardiogram examinations, most                 recent 10/22/2019. Cardiomyopathy, Prior CABG, COPD; Risk                  Factors:Dyslipidemia.  Sonographer:    Delcie Roch Referring Phys: [id number]  CALLIE E GOODRICH IMPRESSIONS  1. Left ventricular ejection fraction, by estimation, is 55 to 60%. The left ventricle has normal function. The left ventricle has no regional wall motion abnormalities. Left ventricular diastolic parameters were normal.  2. Right ventricular systolic function is normal. The right ventricular size is normal. Tricuspid regurgitation signal is inadequate for assessing PA pressure.  3. The mitral valve is normal in structure. Trivial mitral valve regurgitation. No evidence of mitral stenosis.  4. The aortic valve is tricuspid. Aortic valve regurgitation is not visualized. Mild aortic valve sclerosis is present, with no evidence of aortic valve stenosis.  5. The inferior vena cava is normal in size with <50% respiratory variability, suggesting right atrial pressure of 8 mmHg. FINDINGS  Left Ventricle: Left ventricular ejection fraction, by estimation, is 55 to 60%. The left ventricle has normal function. The left ventricle has no regional wall motion abnormalities. The left ventricular internal cavity size was normal in size. There is  no left ventricular hypertrophy. Left ventricular diastolic parameters were normal. Normal left ventricular filling pressure. Right Ventricle: The right ventricular size is normal.Right ventricular systolic function is normal. Tricuspid regurgitation signal is inadequate for assessing PA pressure. Left Atrium: Left atrial size was normal in size. Right Atrium: Right atrial size was normal in size. Pericardium: There is no evidence of pericardial effusion. Mitral Valve: The mitral valve is normal in structure. Trivial mitral valve regurgitation. No evidence of mitral valve stenosis. Tricuspid Valve: The tricuspid valve is normal in structure. Tricuspid valve regurgitation is mild . No evidence of tricuspid stenosis. Aortic Valve: The aortic valve is tricuspid. Aortic valve  regurgitation is not visualized. Mild aortic valve sclerosis is present, with no evidence of aortic valve stenosis. Pulmonic Valve: The pulmonic valve was normal in structure. Pulmonic valve regurgitation is trivial. No evidence of pulmonic stenosis. Aorta: The aortic root is normal in size and structure. Venous: The inferior vena cava is normal in size with less than 50% respiratory variability, suggesting right atrial pressure of 8 mmHg. IAS/Shunts: The interatrial septum appears to be lipomatous. No atrial level shunt detected by color flow Doppler.  LEFT VENTRICLE PLAX 2D LVIDd:         4.60 cm  Diastology LVIDs:         3.30 cm  LV e' medial:    7.62 cm/s LV PW:         1.00 cm  LV E/e' medial:  10.6 LV IVS:        0.80 cm  LV e' lateral:   9.68 cm/s LVOT diam:     2.00 cm  LV E/e' lateral: 8.3 LV SV:         93 LV SV Index:   49 LVOT Area:     3.14 cm  RIGHT VENTRICLE            IVC RV S prime:     9.57 cm/s  IVC diam: 1.80 cm TAPSE (M-mode): 1.7 cm LEFT ATRIUM             Index       RIGHT ATRIUM           Index LA diam:        4.10 cm 2.14 cm/m  RA Area:     13.90 cm LA Vol (A2C):   44.4 ml 23.14 ml/m RA Volume:   33.10 ml  17.25 ml/m LA Vol (A4C):   49.4 ml 25.75 ml/m LA Biplane Vol: 47.9 ml 24.97 ml/m  AORTIC VALVE LVOT Vmax:   130.00 cm/s LVOT Vmean:  73.800 cm/s LVOT VTI:    0.297 m  AORTA Ao Root diam: 3.10 cm Ao Asc diam:  3.60 cm MITRAL VALVE MV Area (PHT): 2.08 cm    SHUNTS MV Decel Time: 364 msec    Systemic VTI:  0.30 m MV E velocity: 80.60 cm/s  Systemic Diam: 2.00 cm MV A velocity: 69.00 cm/s MV E/A ratio:  1.17 Olga MillersBrian Crenshaw MD Electronically signed by Olga MillersBrian Crenshaw MD Signature Date/Time: 07/08/2020/3:08:54 PM    Final     Medications:   . aspirin  81 mg Oral Daily  . atorvastatin  80 mg Oral Daily  . clonazePAM  0.5 mg Oral Daily  . clopidogrel  75 mg Oral Daily  . isosorbide mononitrate  30 mg Oral Daily  . lisinopril  2.5 mg Oral Daily  . pantoprazole  20 mg Oral Daily    Continuous Infusions: . heparin 1,300 Units/hr (07/09/20 1200)     LOS: 1 day   Joseph ArtJessica U Murrel Bertram  [facility]   How to contact the [facility] Attending or Consulting provider 7A - 7P or covering provider during after hours 7P -7A, for this patient?  1. Check the care team in [facility] and look for a) attending/consulting [facility] provider listed and b) the [facility] team listed 2. Log into [email or web address] and use [facility] universal password to access. If you do not have the password, please contact the hospital operator. 3. Locate the [facility] provider you are looking for under [facility] and page to a number that you can be directly reached. 4. If you still have difficulty reaching the provider, please page the [facility] (Director on Call) for the Hospitalists listed on amion for assistance.  07/09/2020, 1:02 PM

## 2020-07-09 NOTE — Progress Notes (Signed)
ANTICOAGULATION CONSULT NOTE  Pharmacy Consult for Heparin Indication: chest pain/ACS  Allergies  Allergen Reactions  . Codeine Nausea And Vomiting    Patient Measurements: Height: 5\' 9"  (175.3 cm) Weight: 76.2 kg (167 lb 15.9 oz) IBW/kg (Calculated) : 70.7 Heparin Dosing Weight: 76 kg  Vital Signs: Temp: 97.8 F (36.6 C) (04/24 0300) Temp Source: Oral (04/24 0300) BP: 161/76 (04/24 0800) Pulse Rate: 56 (04/24 0800)  Labs: Recent Labs    07/07/20 2205 07/08/20 0038 07/08/20 0122 07/08/20 1546 07/08/20 2250 07/09/20 0555  HGB 12.4*  --  12.1*  --   --  12.5*  HCT 36.9*  --  36.0*  --   --  37.2*  PLT 187  --  189  --   --  172  HEPARINUNFRC  --   --   --  0.19* 0.28* 0.36  CREATININE 0.90  --   --   --   --  0.85  TROPONINIHS 10 10  --   --   --   --     Estimated Creatinine Clearance: 82 mL/min (by C-G formula based on SCr of 0.85 mg/dL).   Assessment: 70 yo M with unstable angina. Pharmacy asked to start IV heparin. No AC noted PTA. Plans noted for cardiac cath in the future. Hgb low stable 12, pltc 180s. No overt bleeding noted.  Heparin level therapeutic this AM  Goal of Therapy:  Heparin level 0.3-0.7 units/ml Monitor platelets by anticoagulation protocol: Yes   Plan:  Heparin to 1300 units / hr Heparin level with AM labs Planning cath 4/25  Thank you 5/25, PharmD

## 2020-07-09 NOTE — H&P (View-Only) (Signed)
Progress Note  Patient Name: Franklin Marks Date of Encounter: 07/09/2020  [facility] Cardiologist: Lorine Bears, MD   Subjective   No CP or dyspnea  Inpatient Medications    Scheduled Meds: . aspirin  81 mg Oral Daily  . atorvastatin  80 mg Oral Daily  . clonazePAM  0.5 mg Oral Daily  . clopidogrel  75 mg Oral Daily  . isosorbide mononitrate  30 mg Oral Daily  . lisinopril  2.5 mg Oral Daily  . pantoprazole  20 mg Oral Daily   Continuous Infusions: . heparin 1,300 Units/hr (07/09/20 1008)   PRN Meds: acetaminophen, clonazePAM, ondansetron (ZOFRAN) IV   Vital Signs    Vitals:   07/09/20 0400 07/09/20 0500 07/09/20 0700 07/09/20 0800  BP: 127/68 (!) 104/41 (!) 94/45 (!) 161/76  Pulse: (!) 57 (!) 47 (!) 44 (!) 56  Resp: 18 17 17 14   Temp:      TempSrc:      SpO2: 95% 93% 91% 95%  Weight:      Height:        Intake/Output Summary (Last 24 hours) at 07/09/2020 1016 Last data filed at 07/09/2020 0800 Gross per 24 hour  Intake 1214.22 ml  Output 780 ml  Net 434.22 ml   Last 3 Weights 07/08/2020 07/07/2020 05/02/2020  Weight (lbs) 167 lb 15.9 oz 170 lb 173 lb  Weight (kg) 76.2 kg 77.111 kg 78.472 kg      Telemetry    Sinus bradycardia with mobitz 1 second degree AV block- Personally Reviewed  Physical Exam   GEN: No acute distress.   Neck: No JVD Cardiac: RRR, no murmurs, rubs, or gallops.  Respiratory: Clear to auscultation bilaterally. GI: Soft, nontender, non-distended  MS: No edema Neuro:  Nonfocal  Psych: Normal affect   Labs    High Sensitivity Troponin:   Recent Labs  Lab 07/07/20 2205 07/08/20 0038  TROPONINIHS 10 10      Chemistry Recent Labs  Lab 07/07/20 2205 07/09/20 0555  NA 136 136  K 3.8 4.1  CL 106 107  CO2 22 22  GLUCOSE 113* 109*  BUN 22 14  CREATININE 0.90 0.85  CALCIUM 8.7* 8.8*  GFRNONAA >60 >60  ANIONGAP 8 7     Hematology Recent Labs  Lab 07/07/20 2205 07/08/20 0122 07/09/20 0555  WBC 7.7 8.5 7.9   RBC 3.99* 3.93* 4.07*  HGB 12.4* 12.1* 12.5*  HCT 36.9* 36.0* 37.2*  MCV 92.5 91.6 91.4  MCH 31.1 30.8 30.7  MCHC 33.6 33.6 33.6  RDW 13.2 13.2 13.0  PLT 187 189 172     Radiology    DG Chest 2 View  Result Date: 07/07/2020 CLINICAL DATA:  Intermittent chest pain for 1 hour. Previous history of cardiac surgery. EXAM: CHEST - 2 VIEW COMPARISON:  03/14/2015 FINDINGS: Postoperative changes in the mediastinum. Heart size and pulmonary vascularity are normal. Lungs are clear. No pleural effusions. No pneumothorax. Emphysematous changes in the lungs. Degenerative changes in the spine. Old ununited fracture of the midshaft right clavicle. IMPRESSION: No active cardiopulmonary disease. Electronically Signed   By: 03/16/2015 M.D.   On: 07/07/2020 22:28   ECHOCARDIOGRAM COMPLETE  Result Date: 07/08/2020    ECHOCARDIOGRAM REPORT   Patient Name:   Franklin Marks Date of Exam: 07/08/2020 Medical Rec #:  07/10/2020     Height:       69.0 in Accession #:    [id number]    Weight:  168.0 lb Date of Birth:  May 04, 1950    BSA:          1.918 m Patient Age:    69 years      BP:           101/67 mmHg Patient Gender: M             HR:           46 bpm. Exam Location:  Inpatient Procedure: 2D Echo Indications:    chest pain  History:        Patient has prior history of Echocardiogram examinations, most                 recent 10/22/2019. Cardiomyopathy, Prior CABG, COPD; Risk                 Factors:Dyslipidemia.  Sonographer:    Delcie Roch Referring Phys: [id number] CALLIE E GOODRICH IMPRESSIONS  1. Left ventricular ejection fraction, by estimation, is 55 to 60%. The left ventricle has normal function. The left ventricle has no regional wall motion abnormalities. Left ventricular diastolic parameters were normal.  2. Right ventricular systolic function is normal. The right ventricular size is normal. Tricuspid regurgitation signal is inadequate for assessing PA pressure.  3. The mitral valve is normal in  structure. Trivial mitral valve regurgitation. No evidence of mitral stenosis.  4. The aortic valve is tricuspid. Aortic valve regurgitation is not visualized. Mild aortic valve sclerosis is present, with no evidence of aortic valve stenosis.  5. The inferior vena cava is normal in size with <50% respiratory variability, suggesting right atrial pressure of 8 mmHg. FINDINGS  Left Ventricle: Left ventricular ejection fraction, by estimation, is 55 to 60%. The left ventricle has normal function. The left ventricle has no regional wall motion abnormalities. The left ventricular internal cavity size was normal in size. There is  no left ventricular hypertrophy. Left ventricular diastolic parameters were normal. Normal left ventricular filling pressure. Right Ventricle: The right ventricular size is normal.Right ventricular systolic function is normal. Tricuspid regurgitation signal is inadequate for assessing PA pressure. Left Atrium: Left atrial size was normal in size. Right Atrium: Right atrial size was normal in size. Pericardium: There is no evidence of pericardial effusion. Mitral Valve: The mitral valve is normal in structure. Trivial mitral valve regurgitation. No evidence of mitral valve stenosis. Tricuspid Valve: The tricuspid valve is normal in structure. Tricuspid valve regurgitation is mild . No evidence of tricuspid stenosis. Aortic Valve: The aortic valve is tricuspid. Aortic valve regurgitation is not visualized. Mild aortic valve sclerosis is present, with no evidence of aortic valve stenosis. Pulmonic Valve: The pulmonic valve was normal in structure. Pulmonic valve regurgitation is trivial. No evidence of pulmonic stenosis. Aorta: The aortic root is normal in size and structure. Venous: The inferior vena cava is normal in size with less than 50% respiratory variability, suggesting right atrial pressure of 8 mmHg. IAS/Shunts: The interatrial septum appears to be lipomatous. No atrial level shunt  detected by color flow Doppler.  LEFT VENTRICLE PLAX 2D LVIDd:         4.60 cm  Diastology LVIDs:         3.30 cm  LV e' medial:    7.62 cm/s LV PW:         1.00 cm  LV E/e' medial:  10.6 LV IVS:        0.80 cm  LV e' lateral:   9.68 cm/s LVOT diam:  2.00 cm  LV E/e' lateral: 8.3 LV SV:         93 LV SV Index:   49 LVOT Area:     3.14 cm  RIGHT VENTRICLE            IVC RV S prime:     9.57 cm/s  IVC diam: 1.80 cm TAPSE (M-mode): 1.7 cm LEFT ATRIUM             Index       RIGHT ATRIUM           Index LA diam:        4.10 cm 2.14 cm/m  RA Area:     13.90 cm LA Vol (A2C):   44.4 ml 23.14 ml/m RA Volume:   33.10 ml  17.25 ml/m LA Vol (A4C):   49.4 ml 25.75 ml/m LA Biplane Vol: 47.9 ml 24.97 ml/m  AORTIC VALVE LVOT Vmax:   130.00 cm/s LVOT Vmean:  73.800 cm/s LVOT VTI:    0.297 m  AORTA Ao Root diam: 3.10 cm Ao Asc diam:  3.60 cm MITRAL VALVE MV Area (PHT): 2.08 cm    SHUNTS MV Decel Time: 364 msec    Systemic VTI:  0.30 m MV E velocity: 80.60 cm/s  Systemic Diam: 2.00 cm MV A velocity: 69.00 cm/s MV E/A ratio:  1.17 Olga Millers MD Electronically signed by Olga Millers MD Signature Date/Time: 07/08/2020/3:08:54 PM    Final     Patient Profile     70 year old male with past medical history of coronary artery disease status post coronary artery bypass graft, ischemic cardiomyopathy improved, paroxysmal atrial fibrillation, history of bradycardia, peripheral vascular disease, hypertension, hyperlipidemia admitted with unstable angina.  Patient is status post coronary artery bypass graft in 2001.  He has had PCI most recently in December 2016.   Assessment & Plan    1 unstable angina-patient is pain-free this morning.  Echocardiogram shows normal LV function.  Plan is for cardiac catheterization tomorrow as outlined in previous note.  Continue aspirin, Plavix, statin.  No beta-blocker given baseline bradycardia/conduction disease.  Continue heparin.    2 bradycardia-patient continues to have  significant bradycardia on telemetry and intermittent Mobitz 1 second-degree AV block.  However he continues to have no symptoms.  We will avoid beta-blockade.  No indication for pacemaker at present but may require in the future.    3 hyperlipidemia-continue statin.  4 hypertension-continue present medications and follow.  5 peripheral vascular disease-patient with known occluded SFA bilaterally.  Continue medical therapy.  Follow-up Dr. Kirke Corin.  6 question history of atrial fibrillation-this apparently was remote and I have no documentation.  We will not anticoagulate at this point.  Follow-up Dr. Kirke Corin.  For questions or updates, please contact [facility] Please consult [email or web address] for contact info under        Signed, Olga Millers, MD  07/09/2020, 10:16 AM

## 2020-07-09 NOTE — Progress Notes (Signed)
 Progress Note  Patient Name: Aveer S Rzepka Date of Encounter: 07/09/2020  [facility] Cardiologist: Muhammad Arida, MD   Subjective   No CP or dyspnea  Inpatient Medications    Scheduled Meds: . aspirin  81 mg Oral Daily  . atorvastatin  80 mg Oral Daily  . clonazePAM  0.5 mg Oral Daily  . clopidogrel  75 mg Oral Daily  . isosorbide mononitrate  30 mg Oral Daily  . lisinopril  2.5 mg Oral Daily  . pantoprazole  20 mg Oral Daily   Continuous Infusions: . heparin 1,300 Units/hr (07/09/20 1008)   PRN Meds: acetaminophen, clonazePAM, ondansetron (ZOFRAN) IV   Vital Signs    Vitals:   07/09/20 0400 07/09/20 0500 07/09/20 0700 07/09/20 0800  BP: 127/68 (!) 104/41 (!) 94/45 (!) 161/76  Pulse: (!) 57 (!) 47 (!) 44 (!) 56  Resp: 18 17 17 14  Temp:      TempSrc:      SpO2: 95% 93% 91% 95%  Weight:      Height:        Intake/Output Summary (Last 24 hours) at 07/09/2020 1016 Last data filed at 07/09/2020 0800 Gross per 24 hour  Intake 1214.22 ml  Output 780 ml  Net 434.22 ml   Last 3 Weights 07/08/2020 07/07/2020 05/02/2020  Weight (lbs) 167 lb 15.9 oz 170 lb 173 lb  Weight (kg) 76.2 kg 77.111 kg 78.472 kg      Telemetry    Sinus bradycardia with mobitz 1 second degree AV block- Personally Reviewed  Physical Exam   GEN: No acute distress.   Neck: No JVD Cardiac: RRR, no murmurs, rubs, or gallops.  Respiratory: Clear to auscultation bilaterally. GI: Soft, nontender, non-distended  MS: No edema Neuro:  Nonfocal  Psych: Normal affect   Labs    High Sensitivity Troponin:   Recent Labs  Lab 07/07/20 2205 07/08/20 0038  TROPONINIHS 10 10      Chemistry Recent Labs  Lab 07/07/20 2205 07/09/20 0555  NA 136 136  K 3.8 4.1  CL 106 107  CO2 22 22  GLUCOSE 113* 109*  BUN 22 14  CREATININE 0.90 0.85  CALCIUM 8.7* 8.8*  GFRNONAA >60 >60  ANIONGAP 8 7     Hematology Recent Labs  Lab 07/07/20 2205 07/08/20 0122 07/09/20 0555  WBC 7.7 8.5 7.9   RBC 3.99* 3.93* 4.07*  HGB 12.4* 12.1* 12.5*  HCT 36.9* 36.0* 37.2*  MCV 92.5 91.6 91.4  MCH 31.1 30.8 30.7  MCHC 33.6 33.6 33.6  RDW 13.2 13.2 13.0  PLT 187 189 172     Radiology    DG Chest 2 View  Result Date: 07/07/2020 CLINICAL DATA:  Intermittent chest pain for 1 hour. Previous history of cardiac surgery. EXAM: CHEST - 2 VIEW COMPARISON:  03/14/2015 FINDINGS: Postoperative changes in the mediastinum. Heart size and pulmonary vascularity are normal. Lungs are clear. No pleural effusions. No pneumothorax. Emphysematous changes in the lungs. Degenerative changes in the spine. Old ununited fracture of the midshaft right clavicle. IMPRESSION: No active cardiopulmonary disease. Electronically Signed   By: William  Stevens M.D.   On: 07/07/2020 22:28   ECHOCARDIOGRAM COMPLETE  Result Date: 07/08/2020    ECHOCARDIOGRAM REPORT   Patient Name:   Aravind S Mccoin Date of Exam: 07/08/2020 Medical Rec #:  [id number]     Height:       69.0 in Accession #:    [id number]    Weight:         168.0 lb Date of Birth:  May 04, 1950    BSA:          1.918 m Patient Age:    70 years      BP:           101/67 mmHg Patient Gender: M             HR:           46 bpm. Exam Location:  Inpatient Procedure: 2D Echo Indications:    chest pain  History:        Patient has prior history of Echocardiogram examinations, most                 recent 10/22/2019. Cardiomyopathy, Prior CABG, COPD; Risk                 Factors:Dyslipidemia.  Sonographer:    Delcie Roch Referring Phys: [id number] CALLIE E GOODRICH IMPRESSIONS  1. Left ventricular ejection fraction, by estimation, is 55 to 60%. The left ventricle has normal function. The left ventricle has no regional wall motion abnormalities. Left ventricular diastolic parameters were normal.  2. Right ventricular systolic function is normal. The right ventricular size is normal. Tricuspid regurgitation signal is inadequate for assessing PA pressure.  3. The mitral valve is normal in  structure. Trivial mitral valve regurgitation. No evidence of mitral stenosis.  4. The aortic valve is tricuspid. Aortic valve regurgitation is not visualized. Mild aortic valve sclerosis is present, with no evidence of aortic valve stenosis.  5. The inferior vena cava is normal in size with <50% respiratory variability, suggesting right atrial pressure of 8 mmHg. FINDINGS  Left Ventricle: Left ventricular ejection fraction, by estimation, is 55 to 60%. The left ventricle has normal function. The left ventricle has no regional wall motion abnormalities. The left ventricular internal cavity size was normal in size. There is  no left ventricular hypertrophy. Left ventricular diastolic parameters were normal. Normal left ventricular filling pressure. Right Ventricle: The right ventricular size is normal.Right ventricular systolic function is normal. Tricuspid regurgitation signal is inadequate for assessing PA pressure. Left Atrium: Left atrial size was normal in size. Right Atrium: Right atrial size was normal in size. Pericardium: There is no evidence of pericardial effusion. Mitral Valve: The mitral valve is normal in structure. Trivial mitral valve regurgitation. No evidence of mitral valve stenosis. Tricuspid Valve: The tricuspid valve is normal in structure. Tricuspid valve regurgitation is mild . No evidence of tricuspid stenosis. Aortic Valve: The aortic valve is tricuspid. Aortic valve regurgitation is not visualized. Mild aortic valve sclerosis is present, with no evidence of aortic valve stenosis. Pulmonic Valve: The pulmonic valve was normal in structure. Pulmonic valve regurgitation is trivial. No evidence of pulmonic stenosis. Aorta: The aortic root is normal in size and structure. Venous: The inferior vena cava is normal in size with less than 50% respiratory variability, suggesting right atrial pressure of 8 mmHg. IAS/Shunts: The interatrial septum appears to be lipomatous. No atrial level shunt  detected by color flow Doppler.  LEFT VENTRICLE PLAX 2D LVIDd:         4.60 cm  Diastology LVIDs:         3.30 cm  LV e' medial:    7.62 cm/s LV PW:         1.00 cm  LV E/e' medial:  10.6 LV IVS:        0.80 cm  LV e' lateral:   9.68 cm/s LVOT diam:  2.00 cm  LV E/e' lateral: 8.3 LV SV:         93 LV SV Index:   49 LVOT Area:     3.14 cm  RIGHT VENTRICLE            IVC RV S prime:     9.57 cm/s  IVC diam: 1.80 cm TAPSE (M-mode): 1.7 cm LEFT ATRIUM             Index       RIGHT ATRIUM           Index LA diam:        4.10 cm 2.14 cm/m  RA Area:     13.90 cm LA Vol (A2C):   44.4 ml 23.14 ml/m RA Volume:   33.10 ml  17.25 ml/m LA Vol (A4C):   49.4 ml 25.75 ml/m LA Biplane Vol: 47.9 ml 24.97 ml/m  AORTIC VALVE LVOT Vmax:   130.00 cm/s LVOT Vmean:  73.800 cm/s LVOT VTI:    0.297 m  AORTA Ao Root diam: 3.10 cm Ao Asc diam:  3.60 cm MITRAL VALVE MV Area (PHT): 2.08 cm    SHUNTS MV Decel Time: 364 msec    Systemic VTI:  0.30 m MV E velocity: 80.60 cm/s  Systemic Diam: 2.00 cm MV A velocity: 69.00 cm/s MV E/A ratio:  1.17 Olga Millers MD Electronically signed by Olga Millers MD Signature Date/Time: 07/08/2020/3:08:54 PM    Final     Patient Profile     70 year old male with past medical history of coronary artery disease status post coronary artery bypass graft, ischemic cardiomyopathy improved, paroxysmal atrial fibrillation, history of bradycardia, peripheral vascular disease, hypertension, hyperlipidemia admitted with unstable angina.  Patient is status post coronary artery bypass graft in 2001.  He has had PCI most recently in December 2016.   Assessment & Plan    1 unstable angina-patient is pain-free this morning.  Echocardiogram shows normal LV function.  Plan is for cardiac catheterization tomorrow as outlined in previous note.  Continue aspirin, Plavix, statin.  No beta-blocker given baseline bradycardia/conduction disease.  Continue heparin.    2 bradycardia-patient continues to have  significant bradycardia on telemetry and intermittent Mobitz 1 second-degree AV block.  However he continues to have no symptoms.  We will avoid beta-blockade.  No indication for pacemaker at present but may require in the future.    3 hyperlipidemia-continue statin.  4 hypertension-continue present medications and follow.  5 peripheral vascular disease-patient with known occluded SFA bilaterally.  Continue medical therapy.  Follow-up Dr. Kirke Corin.  6 question history of atrial fibrillation-this apparently was remote and I have no documentation.  We will not anticoagulate at this point.  Follow-up Dr. Kirke Corin.  For questions or updates, please contact [facility] Please consult [email or web address] for contact info under        Signed, Olga Millers, MD  07/09/2020, 10:16 AM

## 2020-07-10 ENCOUNTER — Encounter (HOSPITAL_COMMUNITY): Payer: Self-pay | Admitting: Interventional Cardiology

## 2020-07-10 ENCOUNTER — Encounter (HOSPITAL_COMMUNITY)
Admission: EM | Disposition: A | Discharge status: Home / Self Care | Payer: Self-pay | Source: Home / Self Care | Attending: Internal Medicine

## 2020-07-10 DIAGNOSIS — I25110 Atherosclerotic heart disease of native coronary artery with unstable angina pectoris: Principal | ICD-10-CM

## 2020-07-10 DIAGNOSIS — I25710 Atherosclerosis of autologous vein coronary artery bypass graft(s) with unstable angina pectoris: Secondary | ICD-10-CM

## 2020-07-10 HISTORY — PX: CORONARY STENT INTERVENTION: CATH118234

## 2020-07-10 HISTORY — PX: INTRAVASCULAR ULTRASOUND/IVUS: CATH118244

## 2020-07-10 HISTORY — PX: LEFT HEART CATH AND CORS/GRAFTS ANGIOGRAPHY: CATH118250

## 2020-07-10 LAB — BASIC METABOLIC PANEL
Anion gap: 6 (ref 5–15)
BUN: 13 mg/dL (ref 8–23)
CO2: 23 mmol/L (ref 22–32)
Calcium: 8.6 mg/dL — ABNORMAL LOW (ref 8.9–10.3)
Chloride: 108 mmol/L (ref 98–111)
Creatinine, Ser: 0.89 mg/dL (ref 0.61–1.24)
GFR, Estimated: >60 mL/min (ref >60)
Glucose, Bld: 103 mg/dL — ABNORMAL HIGH (ref 70–99)
Potassium: 3.7 mmol/L (ref 3.5–5.1)
Sodium: 137 mmol/L (ref 135–145)

## 2020-07-10 LAB — CBC
HCT: 35.6 % — ABNORMAL LOW (ref 39.0–52.0)
Hemoglobin: 12.1 g/dL — ABNORMAL LOW (ref 13.0–17.0)
MCH: 30.9 pg (ref 26.0–34.0)
MCHC: 34 g/dL (ref 30.0–36.0)
MCV: 91 fL (ref 80.0–100.0)
Platelets: 167 10*3/uL (ref 150–400)
RBC: 3.91 MIL/uL — ABNORMAL LOW (ref 4.22–5.81)
RDW: 13.1 % (ref 11.5–15.5)
WBC: 6.9 10*3/uL (ref 4.0–10.5)
nRBC: 0 % (ref 0.0–0.2)

## 2020-07-10 LAB — HEPARIN LEVEL (UNFRACTIONATED): Heparin Unfractionated: 0.44 IU/mL (ref 0.30–0.70)

## 2020-07-10 LAB — POCT ACTIVATED CLOTTING TIME: Activated Clotting Time: 303 seconds

## 2020-07-10 SURGERY — LEFT HEART CATH AND CORS/GRAFTS ANGIOGRAPHY
Anesthesia: LOCAL

## 2020-07-10 MED ORDER — ACETAMINOPHEN 325 MG PO TABS: 650.0000 mg | ORAL_TABLET | ORAL | Status: DC | PRN

## 2020-07-10 MED ORDER — SODIUM CHLORIDE 0.9 % WEIGHT BASED INFUSION: 1.0000 mL/kg/h | INTRAVENOUS | Status: DC

## 2020-07-10 MED ORDER — VERAPAMIL HCL 2.5 MG/ML IV SOLN
INTRAVENOUS | Status: DC | PRN
  Administered 2020-07-10: 400 ug via INTRACORONARY
  Administered 2020-07-10: 600 ug via INTRACORONARY
  Administered 2020-07-10 (×2): 200 ug via INTRACORONARY

## 2020-07-10 MED ORDER — IOHEXOL 350 MG/ML SOLN
INTRAVENOUS | Status: DC | PRN
  Administered 2020-07-10: 135 mL

## 2020-07-10 MED ORDER — SODIUM CHLORIDE 0.9 % WEIGHT BASED INFUSION: 3.0000 mL/kg/h | INTRAVENOUS | Status: DC

## 2020-07-10 MED ORDER — FENTANYL CITRATE (PF) 100 MCG/2ML IJ SOLN
INTRAMUSCULAR | Status: AC
  Filled 2020-07-10: qty 2

## 2020-07-10 MED ORDER — MIDAZOLAM HCL 2 MG/2ML IJ SOLN
INTRAMUSCULAR | Status: AC
  Filled 2020-07-10: qty 2

## 2020-07-10 MED ORDER — FENTANYL CITRATE (PF) 100 MCG/2ML IJ SOLN
INTRAMUSCULAR | Status: DC | PRN
  Administered 2020-07-10 (×4): 25 ug via INTRAVENOUS

## 2020-07-10 MED ORDER — MIDAZOLAM HCL 2 MG/2ML IJ SOLN
INTRAMUSCULAR | Status: DC | PRN
  Administered 2020-07-10 (×2): 1 mg via INTRAVENOUS
  Administered 2020-07-10: 2 mg via INTRAVENOUS

## 2020-07-10 MED ORDER — HEPARIN (PORCINE) IN NACL 1000-0.9 UT/500ML-% IV SOLN
INTRAVENOUS | Status: DC | PRN
  Administered 2020-07-10 (×2): 500 mL

## 2020-07-10 MED ORDER — HEPARIN (PORCINE) 25000 UT/250ML-% IV SOLN: 1300.0000 [IU]/h | INTRAVENOUS | Status: DC

## 2020-07-10 MED ORDER — LIDOCAINE HCL (PF) 1 % IJ SOLN
INTRAMUSCULAR | Status: DC | PRN
  Administered 2020-07-10: 3 mL

## 2020-07-10 MED ORDER — SODIUM CHLORIDE 0.9% FLUSH: 3.0000 mL | INTRAVENOUS | Status: DC | PRN

## 2020-07-10 MED ORDER — HEPARIN (PORCINE) IN NACL 1000-0.9 UT/500ML-% IV SOLN
INTRAVENOUS | Status: AC
  Filled 2020-07-10: qty 1000

## 2020-07-10 MED ORDER — SODIUM CHLORIDE 0.9% FLUSH
3.0000 mL | Freq: Two times a day (BID) | INTRAVENOUS | Status: DC
  Administered 2020-07-10 – 2020-07-11 (×2): 3 mL via INTRAVENOUS

## 2020-07-10 MED ORDER — SODIUM CHLORIDE 0.9 % WEIGHT BASED INFUSION
3.0000 mL/kg/h | INTRAVENOUS | Status: DC
  Administered 2020-07-10: 3 mL/kg/h via INTRAVENOUS

## 2020-07-10 MED ORDER — SODIUM CHLORIDE 0.9% FLUSH: 3.0000 mL | Freq: Two times a day (BID) | INTRAVENOUS | Status: DC

## 2020-07-10 MED ORDER — HYDRALAZINE HCL 20 MG/ML IJ SOLN: 10.0000 mg | INTRAMUSCULAR | Status: AC | PRN

## 2020-07-10 MED ORDER — SODIUM CHLORIDE 0.9 % IV SOLN: 250.0000 mL | INTRAVENOUS | Status: DC | PRN

## 2020-07-10 MED ORDER — VERAPAMIL HCL 2.5 MG/ML IV SOLN
INTRAVENOUS | Status: DC | PRN
  Administered 2020-07-10: 10 mL via INTRA_ARTERIAL

## 2020-07-10 MED ORDER — LABETALOL HCL 5 MG/ML IV SOLN: 10.0000 mg | INTRAVENOUS | Status: AC | PRN

## 2020-07-10 MED ORDER — HEPARIN SODIUM (PORCINE) 1000 UNIT/ML IJ SOLN
INTRAMUSCULAR | Status: AC
  Filled 2020-07-10: qty 1

## 2020-07-10 MED ORDER — HEPARIN SODIUM (PORCINE) 1000 UNIT/ML IJ SOLN
INTRAMUSCULAR | Status: DC | PRN
  Administered 2020-07-10: 5000 [IU] via INTRAVENOUS
  Administered 2020-07-10: 4000 [IU] via INTRAVENOUS
  Administered 2020-07-10: 2000 [IU] via INTRAVENOUS

## 2020-07-10 MED ORDER — SODIUM CHLORIDE 0.9 % IV SOLN: INTRAVENOUS | Status: AC

## 2020-07-10 MED ORDER — ASPIRIN 81 MG PO CHEW
81.0000 mg | CHEWABLE_TABLET | Freq: Every day | ORAL | Status: DC
  Administered 2020-07-11: 81 mg via ORAL
  Filled 2020-07-10: qty 1

## 2020-07-10 MED ORDER — VERAPAMIL HCL 2.5 MG/ML IV SOLN
INTRAVENOUS | Status: AC
  Filled 2020-07-10: qty 2

## 2020-07-10 MED ORDER — ONDANSETRON HCL 4 MG/2ML IJ SOLN: 4.0000 mg | Freq: Four times a day (QID) | INTRAMUSCULAR | Status: DC | PRN

## 2020-07-10 MED ORDER — LIDOCAINE HCL (PF) 1 % IJ SOLN
INTRAMUSCULAR | Status: AC
  Filled 2020-07-10: qty 30

## 2020-07-10 MED ORDER — CLOPIDOGREL BISULFATE 75 MG PO TABS: 75.0000 mg | ORAL_TABLET | Freq: Every day | ORAL | Status: DC

## 2020-07-10 SURGICAL SUPPLY — 24 items
BALLN  ~~LOC~~ SAPPHIRE 5.0X8 (BALLOONS) ×2
BALLN WOLVERINE 3.25X10 (BALLOONS) ×2
BALLN ~~LOC~~ SAPPHIRE 5.0X8 (BALLOONS) ×1
BALLOON WOLVERINE 3.25X10 (BALLOONS) IMPLANT
BALLOON ~~LOC~~ SAPPHIRE 5.0X8 (BALLOONS) IMPLANT
CATH INFINITI 5FR AL1 (CATHETERS) ×1 IMPLANT
CATH INFINITI 5FR MULTPACK ANG (CATHETERS) ×1 IMPLANT
CATH LAUNCHER 6FR AR1 (CATHETERS) ×1 IMPLANT
CATH OPTICROSS HD (CATHETERS) ×1 IMPLANT
DEVICE RAD COMP TR BAND LRG (VASCULAR PRODUCTS) ×1 IMPLANT
GUIDEWIRE INQWIRE 1.5J.035X260 (WIRE) IMPLANT
INQWIRE 1.5J .035X260CM (WIRE) ×2
KIT ENCORE 26 ADVANTAGE (KITS) ×1 IMPLANT
KIT HEART LEFT (KITS) ×2 IMPLANT
KIT HEMO VALVE WATCHDOG (MISCELLANEOUS) ×1 IMPLANT
PACK CARDIAC CATHETERIZATION (CUSTOM PROCEDURE TRAY) ×2 IMPLANT
SHEATH PROBE COVER 6X72 (BAG) ×1 IMPLANT
SHEATH RAIN RADIAL 21G 6FR (SHEATH) ×1 IMPLANT
SLED PULL BACK IVUS (MISCELLANEOUS) ×1 IMPLANT
STENT SYNERGY XD 4.50X12 (Permanent Stent) IMPLANT
SYNERGY XD 4.50X12 (Permanent Stent) ×2 IMPLANT
TRANSDUCER W/STOPCOCK (MISCELLANEOUS) ×2 IMPLANT
TUBING CIL FLEX 10 FLL-RA (TUBING) ×2 IMPLANT
WIRE ASAHI PROWATER 180CM (WIRE) ×1 IMPLANT

## 2020-07-10 NOTE — Progress Notes (Signed)
Progress Note  Patient Name: Franklin Marks Date of Encounter: 07/10/2020  [facility] Cardiologist: Lorine Bears, MD   Subjective   No CP or dyspnea. Some soreness in the left arm.   Inpatient Medications    Scheduled Meds: . [START ON 07/11/2020] aspirin  81 mg Oral Daily  . atorvastatin  80 mg Oral Daily  . clonazePAM  0.5 mg Oral Daily  . clopidogrel  75 mg Oral Daily  . isosorbide mononitrate  30 mg Oral Daily  . lisinopril  2.5 mg Oral Daily  . pantoprazole  20 mg Oral Daily  . sodium chloride flush  3 mL Intravenous Q12H   Continuous Infusions: . sodium chloride    . sodium chloride    . heparin     PRN Meds: sodium chloride, acetaminophen, clonazePAM, hydrALAZINE, labetalol, ondansetron (ZOFRAN) IV, sodium chloride flush   Vital Signs    Vitals:   07/10/20 0415 07/10/20 0736 07/10/20 0853 07/10/20 1100  BP: (!) 104/44 125/69  (!) 168/83  Pulse: (!) 47 (!) 50  (!) 50  Resp:  16  14  Temp: 98 F (36.7 C) 97.8 F (36.6 C)    TempSrc: Oral Oral    SpO2: 95% 96% 100% 94%  Weight:      Height:        Intake/Output Summary (Last 24 hours) at 07/10/2020 1606 Last data filed at 07/10/2020 1400 Gross per 24 hour  Intake 432.31 ml  Output 1500 ml  Net -1067.69 ml   Last 3 Weights 07/08/2020 07/07/2020 05/02/2020  Weight (lbs) 167 lb 15.9 oz 170 lb 173 lb  Weight (kg) 76.2 kg 77.111 kg 78.472 kg      Telemetry    NSR - Personally Reviewed   Physical Exam  Alert, oriented, in NAD GEN: No acute distress.   Neck: No JVD Cardiac: RRR, no murmurs, rubs, or gallops.  Respiratory: Clear to auscultation bilaterally. GI: Soft, nontender, non-distended  MS: No edema; No deformity. Small left forearm hematoma above TR band Neuro:  Nonfocal  Psych: Normal affect   Labs    High Sensitivity Troponin:   Recent Labs  Lab 07/07/20 2205 07/08/20 0038  TROPONINIHS 10 10      Chemistry Recent Labs  Lab 07/07/20 2205 07/09/20 0555 07/10/20 0040  NA  136 136 137  K 3.8 4.1 3.7  CL 106 107 108  CO2 22 22 23   GLUCOSE 113* 109* 103*  BUN 22 14 13   CREATININE 0.90 0.85 0.89  CALCIUM 8.7* 8.8* 8.6*  GFRNONAA >60 >60 >60  ANIONGAP 8 7 6      Hematology Recent Labs  Lab 07/08/20 0122 07/09/20 0555 07/10/20 0040  WBC 8.5 7.9 6.9  RBC 3.93* 4.07* 3.91*  HGB 12.1* 12.5* 12.1*  HCT 36.0* 37.2* 35.6*  MCV 91.6 91.4 91.0  MCH 30.8 30.7 30.9  MCHC 33.6 33.6 34.0  RDW 13.2 13.0 13.1  PLT 189 172 167    BNPNo results for input(s): BNP, PROBNP in the last 168 hours.   DDimer No results for input(s): DDIMER in the last 168 hours.   Radiology    CARDIAC CATHETERIZATION  Result Date: 07/10/2020  Ost LAD lesion is 100% stenosed. LIMA to LAD is patent.  Dist LAD-1 lesion is 70% stenosed.  SVG to first diagonal is occluded.  Mid Cx lesion is 30% stenosed.  Ost RCA lesion is 100% stenosed. SVG to PDA is patent with 80% proximal stenosis.  A drug-eluting stent was successfully placed  using a SYNERGY XD 4.50X12, postdilated to > 5 mm and optimized with IVUS.  Post intervention, there is a 0% residual stenosis.  RPAV lesion is occluded with left to right collaterals.  Patent stent in SVG to second diagonal.  Mid LM to Ost LAD lesion is 75% stenosed. This has progressed compared to the prior cath.  The left ventricular ejection fraction is 45-50% by visual estimate.  There is mild left ventricular systolic dysfunction. Anterolateral hypokinesis.  LV end diastolic pressure is normal.  There is no aortic valve stenosis.  Origin to Prox Graft lesion before RPAV is 80% stenosed.  And is normal in caliber.  The graft exhibits mild .  Continue aggressive secondary prevention.  Consider PCI of left main into circumflex tomorrow.  Could use right radial approach.    Cardiac Studies   See above  Patient Profile     70 y.o. male with past medical history of coronary artery disease status post coronary artery bypass graft, ischemic  cardiomyopathy improved, remote paroxysmal atrial fibrillation, history of bradycardia, peripheral vascular disease, hypertension, hyperlipidemia admitted with unstable angina. Patient is status post coronary artery bypass graft in 2001. He has had PCI most recently in December 2016.   Assessment & Plan    1.  Unstable angina: Patient now status post saphenous vein graft PCI.  Noted to have residual severe stenosis in the native left main into the left circumflex.  Recommendations noted for staged PCI.  The patient is tolerating aspirin, clopidogrel, and a statin drug.  He is not on a beta-blocker because of bradycardia and conduction disease.  He strongly prefers to be discharged home and come back for elective PCI rather than staying in the hospital and having PCI done again tomorrow.  He feels like he needs a break from these procedures.  After review of his angiograms, I do not think there are any high risk features to the residual lesion in the protected left main and circumflex.  We will plan to discharge him tomorrow and have him return for outpatient elective PCI. 2.  Bradycardia: Patient has had intermittent Mobitz 1 heart block without symptoms.  No beta-blocker.  No indication for permanent pacing. 3.  Mixed hyperlipidemia: Treated with a high intensity statin drug 4.  Hypertension: Controlled on current antihypertensive therapy  Disposition: Anticipate hospital discharge tomorrow with return for possible same-day PCI on an elective basis.    For questions or updates, please contact [facility] Please consult [email or web address] for contact info under        Signed, Tonny Bollman, MD  07/10/2020, 4:06 PM

## 2020-07-10 NOTE — Progress Notes (Signed)
ANTICOAGULATION CONSULT NOTE  Pharmacy Consult for Heparin Indication: chest pain/ACS  Allergies  Allergen Reactions  . Codeine Nausea And Vomiting    Patient Measurements: Height: 5\' 9"  (175.3 cm) Weight: 76.2 kg (167 lb 15.9 oz) IBW/kg (Calculated) : 70.7 Heparin Dosing Weight: 76 kg  Vital Signs: Temp: 97.8 F (36.6 C) (04/25 0736) Temp Source: Oral (04/25 0736) BP: 168/83 (04/25 1100) Pulse Rate: 50 (04/25 1100)  Labs: Recent Labs    07/07/20 2205 07/08/20 0038 07/08/20 0122 07/08/20 1546 07/08/20 2250 07/09/20 0555 07/10/20 0040  HGB 12.4*  --  12.1*  --   --  12.5* 12.1*  HCT 36.9*  --  36.0*  --   --  37.2* 35.6*  PLT 187  --  189  --   --  172 167  HEPARINUNFRC  --   --   --    < > 0.28* 0.36 0.44  CREATININE 0.90  --   --   --   --  0.85 0.89  TROPONINIHS 10 10  --   --   --   --   --    < > = values in this interval not displayed.    Estimated Creatinine Clearance: 78.3 mL/min (by C-G formula based on SCr of 0.89 mg/dL).   Assessment: 70 yo M with unstable angina. Pharmacy asked to start IV heparin. No AC noted PTA. He is s/p cath with DES and plans for staged cath to circ on 4-restart heparin 1300 units/hr at 6:30pm -Heparin level daily wth CBC daily/26. Sheath removed ~ 10:10am and heparin to restart in 8 hrs   Goal of Therapy:  Heparin level 0.3-0.7 units/ml Monitor platelets by anticoagulation protocol: Yes   Plan:  -restart heparin 1300 units/hr at 6:30pm -Heparin level daily wth CBC daily  78, PharmD Clinical Pharmacist **Pharmacist phone directory can now be found on [email or web address] ([email or web address]).  Listed under [facility].

## 2020-07-10 NOTE — Progress Notes (Signed)
Heart Failure Navigator Progress Note  Assessed for Heart & Vascular TOC clinic readiness.  Unfortunately at this time the patient does not meet criteria due to admission for ACS symptoms, cath lab today.   Navigator available for reassessment of patient.   Ozella Rocks, RN, BSN Heart Failure Nurse Navigator [phone]

## 2020-07-10 NOTE — Progress Notes (Signed)
ANTICOAGULATION CONSULT NOTE  Pharmacy Consult for Heparin Indication: chest pain/ACS  Allergies  Allergen Reactions  . Codeine Nausea And Vomiting    Patient Measurements: Height: 5\' 9"  (175.3 cm) Weight: 76.2 kg (167 lb 15.9 oz) IBW/kg (Calculated) : 70.7 Heparin Dosing Weight: 76 kg  Vital Signs: Temp: 97.8 F (36.6 C) (04/25 0736) Temp Source: Oral (04/25 0736) BP: 125/69 (04/25 0736) Pulse Rate: 50 (04/25 0736)  Labs: Recent Labs    07/07/20 2205 07/08/20 0038 07/08/20 0122 07/08/20 1546 07/08/20 2250 07/09/20 0555 07/10/20 0040  HGB 12.4*  --  12.1*  --   --  12.5* 12.1*  HCT 36.9*  --  36.0*  --   --  37.2* 35.6*  PLT 187  --  189  --   --  172 167  HEPARINUNFRC  --   --   --    < > 0.28* 0.36 0.44  CREATININE 0.90  --   --   --   --  0.85 0.89  TROPONINIHS 10 10  --   --   --   --   --    < > = values in this interval not displayed.    Estimated Creatinine Clearance: 78.3 mL/min (by C-G formula based on SCr of 0.89 mg/dL).   Assessment: 70 yo M with unstable angina. Pharmacy asked to start IV heparin. No AC noted PTA. Plans for cardiac cath 4/25. Hgb low stable 12, pltc 160s.  Heparin level therapeutic this AM  Goal of Therapy:  Heparin level 0.3-0.7 units/ml Monitor platelets by anticoagulation protocol: Yes   Plan:  Heparin to 1300 units / hr Heparin level with AM labs Cath today  5/25, PharmD Clinical Pharmacist **Pharmacist phone directory can now be found on [email or web address] ([email or web address]).  Listed under [facility].

## 2020-07-10 NOTE — Plan of Care (Signed)
  Problem: Education: Goal: Knowledge of General Education information will improve Description: Including pain rating scale, medication(s)/side effects and non-pharmacologic comfort measures Outcome: Progressing   Problem: Clinical Measurements: Goal: Diagnostic test results will improve Outcome: Progressing   Problem: Activity: Goal: Risk for activity intolerance will decrease Outcome: Progressing   Problem: Coping: Goal: Level of anxiety will decrease Outcome: Progressing   Problem: Pain Managment: Goal: General experience of comfort will improve Outcome: Progressing   Problem: Safety: Goal: Ability to remain free from injury will improve Outcome: Progressing   

## 2020-07-10 NOTE — Progress Notes (Signed)
Progress Note    Franklin Marks  ZYS:[id number] DOB: [birth year]  DOA: 07/07/2020 PCP: [name], MD    Brief Narrative:     Medical records reviewed and are as summarized below:  Franklin Marks is an 70 y.o. male with medical history significant of CAD status post CABG and stent, COPD, GERD, PAD, systolic heart failure, RLS who presents after an episode of chest pain.  Patient was working around the house helping his daughter paint which is a higher level of activity been normal for him.  He began having a episode of chest pain about 1 hour prior to arrival to the ED.  s/p LHC with stent placed and appears that another  Assessment/Plan:   Principal Problem:   Unstable angina (HCC) Active Problems:   CAD (coronary artery disease)   PAD (peripheral artery disease) (HCC)   Restless legs syndrome   Hyperlipidemia   Systolic CHF (HCC)   GERD (gastroesophageal reflux disease)   Unstable angina Hyperlipidemia CAD > Significant cardiac history with previous CABG x4 and subsequent stenting after that.  Does have a history of episode of arrest as well. > Currently on goal-directed medical therapy with aspirin, Plavix, atorvastatin, lisinopril.  Not currently on a beta-blocker as he did not tolerate this secondary to fatigue and bradycardia. > EKG and troponin in ED were normal however cardiology recommends observation and further evaluation considering his significant cardiac history and suspicious story. - cardiology - s/p LHC Monday with stent-- tentative places for LHC in AM again  Bradycardia -defer to cards  PAD - Continue home aspirin Plavix and atorvastatin  Systolic heart failure > History of this previously though last echo was 50-55% with normal diastolic function and low normal RV function. > Not currently on any diuretics - Continue statin  Normocytic anemia > Mild anemia 12.4 on initial CBC -defer to outpatient  GERD - Continue PPI  Restless  leg syndrome - Continue home Klonopin  Insomnia -increased QHS klonopin   Family Communication/Anticipated D/C date and plan/Code Status   DVT prophylaxis: heparin Code Status: Full Code.  Disposition Plan: Status is: inpt  The patient will require care spanning > 2 midnights and should be moved to inpatient because: Ongoing diagnostic testing needed not appropriate for outpatient work up  Dispo: The patient is from: Home              Anticipated d/c is to: Home              Patient currently is not medically stable to d/c.   Difficult to place patient No         Medical Consultants:    cards  Subjective:   Sleepy after heart cath  Objective:    Vitals:   07/10/20 0415 07/10/20 0736 07/10/20 0853 07/10/20 1100  BP: (!) 104/44 125/69  (!) 168/83  Pulse: (!) 47 (!) 50  (!) 50  Resp:  16  14  Temp: 98 F (36.7 C) 97.8 F (36.6 C)    TempSrc: Oral Oral    SpO2: 95% 96% 100% 94%  Weight:      Height:        Intake/Output Summary (Last 24 hours) at 07/10/2020 1455 Last data filed at 07/10/2020 1400 Gross per 24 hour  Intake 432.31 ml  Output 1500 ml  Net -1067.69 ml   Filed Weights   07/07/20 2214 07/08/20 0120  Weight: 77.1 kg 76.2 kg    Exam:  In bed,  NAD   Bradycardic but regular Pleasant and cooperative           Data Reviewed:   I have personally reviewed following labs and imaging studies:  Labs: Labs show the following:   Basic Metabolic Panel: Recent Labs  Lab 07/07/20 2205 07/09/20 0555 07/10/20 0040  NA 136 136 137  K 3.8 4.1 3.7  CL 106 107 108  CO2 22 22 23   GLUCOSE 113* 109* 103*  BUN 22 14 13   CREATININE 0.90 0.85 0.89  CALCIUM 8.7* 8.8* 8.6*   GFR Estimated Creatinine Clearance: 78.3 mL/min (by C-G formula based on SCr of 0.89 mg/dL). Liver Function Tests: No results for input(s): AST, ALT, ALKPHOS, BILITOT, PROT, ALBUMIN in the last 168 hours. No results for input(s): LIPASE, AMYLASE in the last 168 hours. No  results for input(s): AMMONIA in the last 168 hours. Coagulation profile No results for input(s): INR, PROTIME in the last 168 hours.  CBC: Recent Labs  Lab 07/07/20 2205 07/08/20 0122 07/09/20 0555 07/10/20 0040  WBC 7.7 8.5 7.9 6.9  HGB 12.4* 12.1* 12.5* 12.1*  HCT 36.9* 36.0* 37.2* 35.6*  MCV 92.5 91.6 91.4 91.0  PLT 187 189 172 167   Cardiac Enzymes: No results for input(s): CKTOTAL, CKMB, CKMBINDEX, TROPONINI in the last 168 hours. BNP (last 3 results) No results for input(s): PROBNP in the last 8760 hours. CBG: No results for input(s): GLUCAP in the last 168 hours. D-Dimer: No results for input(s): DDIMER in the last 72 hours. Hgb A1c: Recent Labs    07/08/20 0122  HGBA1C 5.9*   Lipid Profile: Recent Labs    07/08/20 0124  CHOL 90  HDL 26*  LDLCALC 42  TRIG 811108  CHOLHDL 3.5   Thyroid function studies: No results for input(s): TSH, T4TOTAL, T3FREE, THYROIDAB in the last 72 hours.  Invalid input(s): FREET3 Anemia work up: No results for input(s): VITAMINB12, FOLATE, FERRITIN, TIBC, IRON, RETICCTPCT in the last 72 hours. Sepsis Labs: Recent Labs  Lab 07/07/20 2205 07/08/20 0122 07/09/20 0555 07/10/20 0040  WBC 7.7 8.5 7.9 6.9    Microbiology Recent Results (from the past 240 hour(s))  Resp Panel by RT-PCR (Flu A&B, Covid) Nasopharyngeal Swab     Status: None   Collection Time: 07/07/20 10:05 PM   Specimen: Nasopharyngeal Swab; Nasopharyngeal(NP) swabs in vial transport medium  Result Value Ref Range Status   SARS Coronavirus 2 by RT PCR NEGATIVE NEGATIVE Final    Comment: (NOTE) SARS-CoV-2 target nucleic acids are NOT DETECTED.  The SARS-CoV-2 RNA is generally detectable in upper respiratory specimens during the acute phase of infection. The lowest concentration of SARS-CoV-2 viral copies this assay can detect is 138 copies/mL. A negative result does not preclude SARS-Cov-2 infection and should not be used as the sole basis for treatment  or other patient management decisions. A negative result may occur with  improper specimen collection/handling, submission of specimen other than nasopharyngeal swab, presence of viral mutation(s) within the areas targeted by this assay, and inadequate number of viral copies(<138 copies/mL). A negative result must be combined with clinical observations, patient history, and epidemiological information. The expected result is Negative.  Fact Sheet for Patients:  BloggerCourse.comhttps://www.fda.gov/media/152166/download  Fact Sheet for Healthcare Providers:  SeriousBroker.ithttps://www.fda.gov/media/152162/download  This test is no t yet approved or cleared by the [place] FDA and  has been authorized for detection and/or diagnosis of SARS-CoV-2 by FDA under an Emergency Use Authorization (EUA). This EUA will remain  in effect (meaning this  test can be used) for the duration of the COVID-19 declaration under Section 564(b)(1) of the Act, 21 U.S.C.section 360bbb-3(b)(1), unless the authorization is terminated  or revoked sooner.       Influenza A by PCR NEGATIVE NEGATIVE Final   Influenza B by PCR NEGATIVE NEGATIVE Final    Comment: (NOTE) The Xpert Xpress SARS-CoV-2/FLU/RSV plus assay is intended as an aid in the diagnosis of influenza from Nasopharyngeal swab specimens and should not be used as a sole basis for treatment. Nasal washings and aspirates are unacceptable for Xpert Xpress SARS-CoV-2/FLU/RSV testing.  Fact Sheet for Patients: BloggerCourse.com  Fact Sheet for Healthcare Providers: SeriousBroker.it  This test is not yet approved or cleared by the [place] FDA and has been authorized for detection and/or diagnosis of SARS-CoV-2 by FDA under an Emergency Use Authorization (EUA). This EUA will remain in effect (meaning this test can be used) for the duration of the COVID-19 declaration under Section 564(b)(1) of the Act, 21 U.S.C. section  360bbb-3(b)(1), unless the authorization is terminated or revoked.  Performed at [facility], [address] [place], [place] [address]   MRSA PCR Screening     Status: None   Collection Time: 07/08/20  3:00 AM   Specimen: Nasal Mucosa; Nasopharyngeal  Result Value Ref Range Status   MRSA by PCR NEGATIVE NEGATIVE Final    Comment:        The GeneXpert MRSA Assay (FDA approved for NASAL specimens only), is one component of a comprehensive MRSA colonization surveillance program. It is not intended to diagnose MRSA infection nor to guide or monitor treatment for MRSA infections. Performed at [facility], [address] [place], [place] [address]     Procedures and diagnostic studies:  CARDIAC CATHETERIZATION  Result Date: 07/10/2020  Ost LAD lesion is 100% stenosed. LIMA to LAD is patent.  Dist LAD-1 lesion is 70% stenosed.  SVG to first diagonal is occluded.  Mid Cx lesion is 30% stenosed.  Ost RCA lesion is 100% stenosed. SVG to PDA is patent with 80% proximal stenosis.  A drug-eluting stent was successfully placed using a SYNERGY XD [id number], postdilated to > 5 mm and optimized with IVUS.  Post intervention, there is a 0% residual stenosis.  RPAV lesion is occluded with left to right collaterals.  Patent stent in SVG to second diagonal.  Mid LM to Ost LAD lesion is 75% stenosed. This has progressed compared to the prior cath.  The left ventricular ejection fraction is 45-50% by visual estimate.  There is mild left ventricular systolic dysfunction. Anterolateral hypokinesis.  LV end diastolic pressure is normal.  There is no aortic valve stenosis.  Origin to Prox Graft lesion before RPAV is 80% stenosed.  And is normal in caliber.  The graft exhibits mild .  Continue aggressive secondary prevention.  Consider PCI of left main into circumflex tomorrow.  Could use right radial approach.    Medications:   . [START ON 07/11/2020] aspirin  81 mg Oral Daily   . atorvastatin  80 mg Oral Daily  . clonazePAM  0.5 mg Oral Daily  . clopidogrel  75 mg Oral Daily  . isosorbide mononitrate  30 mg Oral Daily  . lisinopril  2.5 mg Oral Daily  . pantoprazole  20 mg Oral Daily  . sodium chloride flush  3 mL Intravenous Q12H   Continuous Infusions: . sodium chloride    . sodium chloride    . heparin  LOS: 2 days   Joseph Art  [facility]   How to contact the [facility] Attending or Consulting provider 7A - 7P or covering provider during after hours 7P -7A, for this patient?  1. Check the care team in [facility] and look for a) attending/consulting TRH provider listed and b) the [facility] team listed 2. Log into [email or web address] and use [place]'s universal password to access. If you do not have the password, please contact the hospital operator. 3. Locate the [facility] provider you are looking for under [facility] and page to a number that you can be directly reached. 4. If you still have difficulty reaching the provider, please page the [facility] (Director on Call) for the Hospitalists listed on amion for assistance.  07/10/2020, 2:55 PM

## 2020-07-10 NOTE — Interval H&P Note (Signed)
Cath Lab Visit (complete for each Cath Lab visit)  Clinical Evaluation Leading to the Procedure:   ACS: Yes.    Non-ACS:    Anginal Classification: CCS IV  Anti-ischemic medical therapy: Minimal Therapy (1 class of medications)  Non-Invasive Test Results: No non-invasive testing performed  Prior CABG: Previous CABG      History and Physical Interval Note:  07/10/2020 8:49 AM  Franklin Marks  has presented today for surgery, with the diagnosis of unstable angina.  The various methods of treatment have been discussed with the patient and family. After consideration of risks, benefits and other options for treatment, the patient has consented to  Procedure(s): LEFT HEART CATH AND CORS/GRAFTS ANGIOGRAPHY (N/A) as a surgical intervention.  The patient's history has been reviewed, patient examined, no change in status, stable for surgery.  I have reviewed the patient's chart and labs.  Questions were answered to the patient's satisfaction.     Lance Muss

## 2020-07-11 ENCOUNTER — Telehealth: Payer: Self-pay

## 2020-07-11 ENCOUNTER — Other Ambulatory Visit (HOSPITAL_COMMUNITY): Payer: Self-pay

## 2020-07-11 ENCOUNTER — Other Ambulatory Visit: Payer: Self-pay | Admitting: Cardiology

## 2020-07-11 LAB — CBC
HCT: 34.7 % — ABNORMAL LOW (ref 39.0–52.0)
Hemoglobin: 11.8 g/dL — ABNORMAL LOW (ref 13.0–17.0)
MCH: 30.7 pg (ref 26.0–34.0)
MCHC: 34 g/dL (ref 30.0–36.0)
MCV: 90.4 fL (ref 80.0–100.0)
Platelets: 173 10*3/uL (ref 150–400)
RBC: 3.84 MIL/uL — ABNORMAL LOW (ref 4.22–5.81)
RDW: 12.9 % (ref 11.5–15.5)
WBC: 7 10*3/uL (ref 4.0–10.5)
nRBC: 0 % (ref 0.0–0.2)

## 2020-07-11 LAB — BASIC METABOLIC PANEL
Anion gap: 8 (ref 5–15)
BUN: 11 mg/dL (ref 8–23)
CO2: 23 mmol/L (ref 22–32)
Calcium: 8.6 mg/dL — ABNORMAL LOW (ref 8.9–10.3)
Chloride: 106 mmol/L (ref 98–111)
Creatinine, Ser: 0.9 mg/dL (ref 0.61–1.24)
GFR, Estimated: >60 mL/min (ref >60)
Glucose, Bld: 94 mg/dL (ref 70–99)
Potassium: 3.5 mmol/L (ref 3.5–5.1)
Sodium: 137 mmol/L (ref 135–145)

## 2020-07-11 SURGERY — CORONARY STENT INTERVENTION
Anesthesia: LOCAL

## 2020-07-11 MED ORDER — ISOSORBIDE MONONITRATE ER 30 MG PO TB24
30.0000 mg | ORAL_TABLET | Freq: Every day | ORAL | 0 refills | Status: DC
  Filled 2020-07-11: qty 30, 30d supply, fill #0

## 2020-07-11 NOTE — Care Management Important Message (Signed)
Important Message  Patient Details  Name: GLENNIS MONTENEGRO MRN: [id number] Date of Birth: 12/16/1950   Medicare Important Message Given:  Yes     Dorena Bodo 07/11/2020, 2:19 PM

## 2020-07-11 NOTE — Telephone Encounter (Signed)
-----   Message from Filbert Schilder, NP sent at 07/11/2020 11:01 AM EDT ----- Regarding: elective intervention I will be discharging this patient from [facility] today and he will return for elective staged PCI to LAD on 07/26/20 with Dr. Kirke Corin. He will not need pre-procedure BMET and CBC however will need pre-procedure COVID testing. Can someone please assist in calling and scheduling this with the patient? He will also need pre-cath orders for that day as well.   Thank you  Georgie Chard

## 2020-07-11 NOTE — Telephone Encounter (Addendum)
Called pre-admit COVID testing at [facility]. lmom for them to schedule the patient for a COVID test on 07/24/20. Update reply sent to [facility] via staff message.  Patient is currently admitted.

## 2020-07-11 NOTE — Progress Notes (Signed)
Took out cardiac monitor claiming that MD told him to go home. No discharge order yet. Refused monitoring .

## 2020-07-11 NOTE — Progress Notes (Addendum)
   Progress Note  Patient Name: Franklin Marks Date of Encounter: 07/11/2020  Primary Cardiologist: Muhammad Arida, MD   Subjective   Doing well today. No chest pain. Plan for elective staged LAD intervention. Will discuss timing with rounding MD    Inpatient Medications    Scheduled Meds: . aspirin  81 mg Oral Daily  . atorvastatin  80 mg Oral Daily  . clonazePAM  0.5 mg Oral Daily  . clopidogrel  75 mg Oral Daily  . isosorbide mononitrate  30 mg Oral Daily  . lisinopril  2.5 mg Oral Daily  . pantoprazole  20 mg Oral Daily  . sodium chloride flush  3 mL Intravenous Q12H   Continuous Infusions: . sodium chloride Stopped (07/10/20 2041)   PRN Meds: sodium chloride, acetaminophen, clonazePAM, ondansetron (ZOFRAN) IV, sodium chloride flush   Vital Signs    Vitals:   07/11/20 0524 07/11/20 0620 07/11/20 0741 07/11/20 0753  BP:   (!) 141/71   Pulse:   (!) 56   Resp: 20 14 15   Temp:   98 F (36.7 C)   TempSrc:   Oral   SpO2:    98%  Weight:      Height:        Intake/Output Summary (Last 24 hours) at 07/11/2020 0846 Last data filed at 07/11/2020 0600 Gross per 24 hour  Intake 483 ml  Output 1301 ml  Net -818 ml   Filed Weights   07/07/20 2214 07/08/20 0120 07/11/20 0315  Weight: 77.1 kg 76.2 kg 76.3 kg    Physical Exam   General: Well developed, well nourished, NAD Neck: Negative for carotid bruits. No JVD Lungs:Clear to ausculation bilaterally. No wheezes, rales, or rhonchi. Breathing is unlabored. Cardiovascular: RRR with S1 S2. No murmurs Abdomen: Soft, non-tender, non-distended. No obvious abdominal masses. Extremities: No edema. Radial pulses 2+ bilaterally Neuro: Alert and oriented. No focal deficits. No facial asymmetry. MAE spontaneously. Psych: Responds to questions appropriately with normal affect.    Labs    Chemistry Recent Labs  Lab 07/09/20 0555 07/10/20 0040 07/11/20 0108  NA 136 137 137  K 4.1 3.7 3.5  CL 107 108 106  CO2 22 23  23  GLUCOSE 109* 103* 94  BUN 14 13 11  CREATININE 0.85 0.89 0.90  CALCIUM 8.8* 8.6* 8.6*  GFRNONAA >60 >60 >60  ANIONGAP 7 6 8     Hematology Recent Labs  Lab 07/09/20 0555 07/10/20 0040 07/11/20 0108  WBC 7.9 6.9 7.0  RBC 4.07* 3.91* 3.84*  HGB 12.5* 12.1* 11.8*  HCT 37.2* 35.6* 34.7*  MCV 91.4 91.0 90.4  MCH 30.7 30.9 30.7  MCHC 33.6 34.0 34.0  RDW 13.0 13.1 12.9  PLT 172 167 173    Cardiac EnzymesNo results for input(s): TROPONINI in the last 168 hours. No results for input(s): TROPIPOC in the last 168 hours.   BNPNo results for input(s): BNP, PROBNP in the last 168 hours.   DDimer No results for input(s): DDIMER in the last 168 hours.   Radiology    CARDIAC CATHETERIZATION  Result Date: 07/10/2020  Ost LAD lesion is 100% stenosed. LIMA to LAD is patent.  Dist LAD-1 lesion is 70% stenosed.  SVG to first diagonal is occluded.  Mid Cx lesion is 30% stenosed.  Ost RCA lesion is 100% stenosed. SVG to PDA is patent with 80% proximal stenosis.  A drug-eluting stent was successfully placed using a SYNERGY XD 4.50X12, postdilated to > 5 mm and   optimized with IVUS.  Post intervention, there is a 0% residual stenosis.  RPAV lesion is occluded with left to right collaterals.  Patent stent in SVG to second diagonal.  Mid LM to Ost LAD lesion is 75% stenosed. This has progressed compared to the prior cath.  The left ventricular ejection fraction is 45-50% by visual estimate.  There is mild left ventricular systolic dysfunction. Anterolateral hypokinesis.  LV end diastolic pressure is normal.  There is no aortic valve stenosis.  Origin to Prox Graft lesion before RPAV is 80% stenosed.  And is normal in caliber.  The graft exhibits mild .  Continue aggressive secondary prevention.  Consider PCI of left main into circumflex tomorrow.  Could use right radial approach.   Telemetry    07/11/20 NSR/SB with HR in the 50-60's, paired PVCs and first degree AV block - Personally  Reviewed  ECG    No new tracing as of 07/11/20- Personally Reviewed  Cardiac Studies   LHC 07/10/20:   Ost LAD lesion is 100% stenosed. LIMA to LAD is patent.  Dist LAD-1 lesion is 70% stenosed.  SVG to first diagonal is occluded.  Mid Cx lesion is 30% stenosed.  Ost RCA lesion is 100% stenosed. SVG to PDA is patent with 80% proximal stenosis.  A drug-eluting stent was successfully placed using a SYNERGY XD 4.50X12, postdilated to > 5 mm and optimized with IVUS.  Post intervention, there is a 0% residual stenosis.  RPAV lesion is occluded with left to right collaterals.  Patent stent in SVG to second diagonal.  Mid LM to Ost LAD lesion is 75% stenosed. This has progressed compared to the prior cath.  The left ventricular ejection fraction is 45-50% by visual estimate.  There is mild left ventricular systolic dysfunction. Anterolateral hypokinesis.  LV end diastolic pressure is normal.  There is no aortic valve stenosis.  Origin to Prox Graft lesion before RPAV is 80% stenosed.  And is normal in caliber.  The graft exhibits mild .   Continue aggressive secondary prevention.  Consider PCI of left main into circumflex tomorrow.  Could use right radial approach.  Diagnostic Dominance: Right    Intervention      Echo 07/08/20:  1. Left ventricular ejection fraction, by estimation, is 55 to 60%. The  left ventricle has normal function. The left ventricle has no regional  wall motion abnormalities. Left ventricular diastolic parameters were  normal.  2. Right ventricular systolic function is normal. The right ventricular  size is normal. Tricuspid regurgitation signal is inadequate for assessing  PA pressure.  3. The mitral valve is normal in structure. Trivial mitral valve  regurgitation. No evidence of mitral stenosis.  4. The aortic valve is tricuspid. Aortic valve regurgitation is not  visualized. Mild aortic valve sclerosis is present, with no  evidence of  aortic valve stenosis.  5. The inferior vena cava is normal in size with <50% respiratory  variability, suggesting right atrial pressure of 8 mmHg.   Patient Profile     70 y.o. male with a hx of coronary artery disease status post coronary artery bypass graft, ischemic cardiomyopathy improved, remote paroxysmal atrial fibrillation, history of bradycardia, peripheral vascular disease, hypertension, hyperlipidemiaadmitted withunstable angina. Patient is status post coronary artery bypass graft in 2001. He has had PCI most recently in December 2016.  Assessment & Plan    1. Unstable angina: -Noted to have residual severe native LM stenosis into the LCx per cath 07/10/20>>recommendations are for staged PCI. Plan  to discharge home today with scheduled PCI (initial plan was for PCI today however patient prefers to go home and return at a later time). Films reviewed by Dr. Excell Seltzer which showed no high risk features.  -Continue ASA, Plavix, statin, Imdur  -No beta blocker with bradycardia  -Will discuss timing with rounding MD  -Denies recurrent chest pain   2. Bradycardia: -Pt noted to have intermittent Mobitz 1 heart block>>asymptomatic -HRs in the high 50's-low 60's   3. HLD: -LDL, 42 on 07/08/20 -Continue high intensity statin   4. HTN: -Stable, 141/71>>105/62>>99/59 -Continue current regimen    Signed, Georgie Chard NP-C [facility] Pager: [phone] 07/11/2020, 8:46 AM   For questions or updates, please contact   Please consult [email or web address] for contact info under Cardiology/STEMI.  Patient seen, examined. Available data reviewed. Agree with findings, assessment, and plan as outlined by Georgie Chard, NP-C.  The patient is independently interviewed and examined.  He is alert, oriented, in no distress.  Lungs are clear, JVP is normal, heart is regular rate and rhythm with distant heart sounds, no murmur gallop.  The left arm has diffuse ecchymoses but no firm  hematoma.  There is no lower extremity edema.  Cardiac catheterization films from yesterday's procedure reviewed.  Plans for elective, staged PCI in the future noted.  I discussed the patient's case with his primary cardiologist, Dr. Kirke Corin.  Plan to bring him back for elective PCI Jul 26, 2020.  His medical program is reviewed as above.  He is not a candidate for a beta-blocker because of bradycardia and Mobitz 1 heart block.  He is asymptomatic and does not meet indication for permanent pacing.  He remains on aspirin, clopidogrel, high intensity statin drug, and isosorbide.  The patient is medically stable for discharge today.  He will be given instructions on returning for outpatient PCI.  It is possible that he will require atherectomy or shockwave lithotripsy for treatment of a calcified protected distal left main/ostial circumflex lesion.  Of note, the patient had a lot of problems with restless legs during his cardiac catheterization procedure.  He requested to be given Klonopin before the procedure next time.  This is worked very well for him in the past.  Tonny Bollman, M.D. 07/11/2020 11:07 AM

## 2020-07-11 NOTE — Discharge Summary (Signed)
Physician Discharge Summary  Franklin Marks ZOX:[id number] DOB: 26-Dec-1950 DOA: 07/07/2020  PCP: [name], MD  Admit date: 07/07/2020 Discharge date: 07/11/2020  Admitted From: home Discharge disposition: home   Recommendations for Outpatient Follow-Up:   1. outpatient PCI   Discharge Diagnosis:   Principal Problem:   Unstable angina (HCC) Active Problems:   CAD (coronary artery disease)   PAD (peripheral artery disease) (HCC)   Restless legs syndrome   Hyperlipidemia   Systolic CHF (HCC)   GERD (gastroesophageal reflux disease)    Discharge Condition: Improved.  Diet recommendation: Low sodium, heart healthy.   Wound care: None.  Code status: Full.   History of Present Illness:   RED MANDT is a 70 y.o. male with medical history significant of CAD status post CABG and stent, COPD, GERD, PAD, systolic heart failure, RLS who presents after an episode of chest pain.             Patient was working around the house helping his daughter paint which is a higher level of activity been normal for him.  He began having a episode of chest pain about 1 hour prior to arrival to the ED.  The pain is described as a sudden onset of left sided chest pain radiating to both shoulders.  He states the pain was about a 3-4 out of 10 and had some associated diaphoresis.  No nausea.  Pain lasted for about 10 to 15 minutes.  States the pain was similar to his previous MI.  He took 4 aspirins and called EMS.  His pain was already improved by the time EMS arrived.             Denies fever, chills, shortness of breath, abdominal pain, constipation, diarrhea, nausea, vomiting   Hospital Course by Problem:   .Unstable angina: -Noted to have residual severe native LM stenosis into the LCx per cath 07/10/20>>recommendations are for staged PCI. Plan to discharge home today with scheduled PCI (initial plan was for PCI today however patient prefers to go home and return at a later  time). Films reviewed by Dr. Excell Seltzer which showed no high risk features.  -Continue ASA, Plavix, statin, Imdur  -No beta blocker with bradycardia  -outpatient PCI repeat in May  Bradycardia: -Pt noted to have intermittent Mobitz 1 heart block>>asymptomatic -HRs in the high 50's-low 60's  -no BB   HLD: -LDL, 42 on 07/08/20 -Continue high intensity statin   HTN: -Stable -Continue current regimen     Medical Consultants:   cards   Discharge Exam:   Vitals:   07/11/20 0741 07/11/20 0753  BP: (!) 141/71   Pulse: (!) 56   Resp: 15   Temp: 98 F (36.7 C)   SpO2:  98%   Vitals:   07/11/20 0524 07/11/20 0620 07/11/20 0741 07/11/20 0753  BP:   (!) 141/71   Pulse:   (!) 56   Resp: Temp:   98 F (36.7 C)   TempSrc:   Oral   SpO2:    98%  Weight:      Height:        General exam: Appears calm and comfortable.    The results of significant diagnostics from this hospitalization (including imaging, microbiology, ancillary and laboratory) are listed below for reference.     Procedures and Diagnostic Studies:   DG Chest 2 View  Result Date: 07/07/2020 CLINICAL DATA:  Intermittent chest pain for  1 hour. Previous history of cardiac surgery. EXAM: CHEST - 2 VIEW COMPARISON:  03/14/2015 FINDINGS: Postoperative changes in the mediastinum. Heart size and pulmonary vascularity are normal. Lungs are clear. No pleural effusions. No pneumothorax. Emphysematous changes in the lungs. Degenerative changes in the spine. Old ununited fracture of the midshaft right clavicle. IMPRESSION: No active cardiopulmonary disease. Electronically Signed   By: Burman Nieves M.D.   On: 07/07/2020 22:28   ECHOCARDIOGRAM COMPLETE  Result Date: 07/08/2020    ECHOCARDIOGRAM REPORT   Patient Name:   Franklin Marks Date of Exam: 07/08/2020 Medical Rec #:  [id number]     Height:       69.0 in Accession #:    [id number]    Weight:       168.0 lb Date of Birth:  [birth year]    BSA:          1.918  m Patient Age:    69 years      BP:           101/67 mmHg Patient Gender: M             HR:           46 bpm. Exam Location:  Inpatient Procedure: 2D Echo Indications:    chest pain  History:        Patient has prior history of Echocardiogram examinations, most                 recent 10/22/2019. Cardiomyopathy, Prior CABG, COPD; Risk                 Factors:Dyslipidemia.  Sonographer:    Delcie Roch Referring Phys: [id number] CALLIE E GOODRICH IMPRESSIONS  1. Left ventricular ejection fraction, by estimation, is 55 to 60%. The left ventricle has normal function. The left ventricle has no regional wall motion abnormalities. Left ventricular diastolic parameters were normal.  2. Right ventricular systolic function is normal. The right ventricular size is normal. Tricuspid regurgitation signal is inadequate for assessing PA pressure.  3. The mitral valve is normal in structure. Trivial mitral valve regurgitation. No evidence of mitral stenosis.  4. The aortic valve is tricuspid. Aortic valve regurgitation is not visualized. Mild aortic valve sclerosis is present, with no evidence of aortic valve stenosis.  5. The inferior vena cava is normal in size with <50% respiratory variability, suggesting right atrial pressure of 8 mmHg. FINDINGS  Left Ventricle: Left ventricular ejection fraction, by estimation, is 55 to 60%. The left ventricle has normal function. The left ventricle has no regional wall motion abnormalities. The left ventricular internal cavity size was normal in size. There is  no left ventricular hypertrophy. Left ventricular diastolic parameters were normal. Normal left ventricular filling pressure. Right Ventricle: The right ventricular size is normal.Right ventricular systolic function is normal. Tricuspid regurgitation signal is inadequate for assessing PA pressure. Left Atrium: Left atrial size was normal in size. Right Atrium: Right atrial size was normal in size. Pericardium: There is no evidence of  pericardial effusion. Mitral Valve: The mitral valve is normal in structure. Trivial mitral valve regurgitation. No evidence of mitral valve stenosis. Tricuspid Valve: The tricuspid valve is normal in structure. Tricuspid valve regurgitation is mild . No evidence of tricuspid stenosis. Aortic Valve: The aortic valve is tricuspid. Aortic valve regurgitation is not visualized. Mild aortic valve sclerosis is present, with no evidence of aortic valve stenosis. Pulmonic Valve: The pulmonic valve was normal in structure. Pulmonic valve regurgitation is trivial. No  evidence of pulmonic stenosis. Aorta: The aortic root is normal in size and structure. Venous: The inferior vena cava is normal in size with less than 50% respiratory variability, suggesting right atrial pressure of 8 mmHg. IAS/Shunts: The interatrial septum appears to be lipomatous. No atrial level shunt detected by color flow Doppler.  LEFT VENTRICLE PLAX 2D LVIDd:         4.60 cm  Diastology LVIDs:         3.30 cm  LV e' medial:    7.62 cm/s LV PW:         1.00 cm  LV E/e' medial:  10.6 LV IVS:        0.80 cm  LV e' lateral:   9.68 cm/s LVOT diam:     2.00 cm  LV E/e' lateral: 8.3 LV SV:         93 LV SV Index:   49 LVOT Area:     3.14 cm  RIGHT VENTRICLE            IVC RV S prime:     9.57 cm/s  IVC diam: 1.80 cm TAPSE (M-mode): 1.7 cm LEFT ATRIUM             Index       RIGHT ATRIUM           Index LA diam:        4.10 cm 2.14 cm/m  RA Area:     13.90 cm LA Vol (A2C):   44.4 ml 23.14 ml/m RA Volume:   33.10 ml  17.25 ml/m LA Vol (A4C):   49.4 ml 25.75 ml/m LA Biplane Vol: 47.9 ml 24.97 ml/m  AORTIC VALVE LVOT Vmax:   130.00 cm/s LVOT Vmean:  73.800 cm/s LVOT VTI:    0.297 m  AORTA Ao Root diam: 3.10 cm Ao Asc diam:  3.60 cm MITRAL VALVE MV Area (PHT): 2.08 cm    SHUNTS MV Decel Time: 364 msec    Systemic VTI:  0.30 m MV E velocity: 80.60 cm/s  Systemic Diam: 2.00 cm MV A velocity: 69.00 cm/s MV E/A ratio:  1.17 Olga Millers MD Electronically  signed by Olga Millers MD Signature Date/Time: 07/08/2020/3:08:54 PM    Final      Labs:   Basic Metabolic Panel: Recent Labs  Lab 07/07/20 2205 07/09/20 0555 07/10/20 0040 07/11/20 0108  NA 136 136 137 137  K 3.8 4.1 3.7 3.5  CL 106 107 108 106  CO2 22 22 23 23   GLUCOSE 113* 109* 103* 94  BUN 22 14 13 11   CREATININE 0.90 0.85 0.89 0.90  CALCIUM 8.7* 8.8* 8.6* 8.6*   GFR Estimated Creatinine Clearance: 77.5 mL/min (by C-G formula based on SCr of 0.9 mg/dL). Liver Function Tests: No results for input(s): AST, ALT, ALKPHOS, BILITOT, PROT, ALBUMIN in the last 168 hours. No results for input(s): LIPASE, AMYLASE in the last 168 hours. No results for input(s): AMMONIA in the last 168 hours. Coagulation profile No results for input(s): INR, PROTIME in the last 168 hours.  CBC: Recent Labs  Lab 07/07/20 2205 07/08/20 0122 07/09/20 0555 07/10/20 0040 07/11/20 0108  WBC 7.7 8.5 7.9 6.9 7.0  HGB 12.4* 12.1* 12.5* 12.1* 11.8*  HCT 36.9* 36.0* 37.2* 35.6* 34.7*  MCV 92.5 91.6 91.4 91.0 90.4  PLT 187 189 172 167 173   Cardiac Enzymes: No results for input(s): CKTOTAL, CKMB, CKMBINDEX, TROPONINI in the last 168 hours. BNP: Invalid input(s): POCBNP CBG: No results  for input(s): GLUCAP in the last 168 hours. D-Dimer No results for input(s): DDIMER in the last 72 hours. Hgb A1c No results for input(s): HGBA1C in the last 72 hours. Lipid Profile No results for input(s): CHOL, HDL, LDLCALC, TRIG, CHOLHDL, LDLDIRECT in the last 72 hours. Thyroid function studies No results for input(s): TSH, T4TOTAL, T3FREE, THYROIDAB in the last 72 hours.  Invalid input(s): FREET3 Anemia work up No results for input(s): VITAMINB12, FOLATE, FERRITIN, TIBC, IRON, RETICCTPCT in the last 72 hours. Microbiology Recent Results (from the past 240 hour(s))  Resp Panel by RT-PCR (Flu A&B, Covid) Nasopharyngeal Swab     Status: None   Collection Time: 07/07/20 10:05 PM   Specimen: Nasopharyngeal  Swab; Nasopharyngeal(NP) swabs in vial transport medium  Result Value Ref Range Status   SARS Coronavirus 2 by RT PCR NEGATIVE NEGATIVE Final    Comment: (NOTE) SARS-CoV-2 target nucleic acids are NOT DETECTED.  The SARS-CoV-2 RNA is generally detectable in upper respiratory specimens during the acute phase of infection. The lowest concentration of SARS-CoV-2 viral copies this assay can detect is 138 copies/mL. A negative result does not preclude SARS-Cov-2 infection and should not be used as the sole basis for treatment or other patient management decisions. A negative result may occur with  improper specimen collection/handling, submission of specimen other than nasopharyngeal swab, presence of viral mutation(s) within the areas targeted by this assay, and inadequate number of viral copies(<138 copies/mL). A negative result must be combined with clinical observations, patient history, and epidemiological information. The expected result is Negative.  Fact Sheet for Patients:  BloggerCourse.comhttps://www.fda.gov/media/152166/download  Fact Sheet for Healthcare Providers:  SeriousBroker.ithttps://www.fda.gov/media/152162/download  This test is no t yet approved or cleared by the [place] FDA and  has been authorized for detection and/or diagnosis of SARS-CoV-2 by FDA under an Emergency Use Authorization (EUA). This EUA will remain  in effect (meaning this test can be used) for the duration of the COVID-19 declaration under Section 564(b)(1) of the Act, 21 U.S.C.section 360bbb-3(b)(1), unless the authorization is terminated  or revoked sooner.       Influenza A by PCR NEGATIVE NEGATIVE Final   Influenza B by PCR NEGATIVE NEGATIVE Final    Comment: (NOTE) The Xpert Xpress SARS-CoV-2/FLU/RSV plus assay is intended as an aid in the diagnosis of influenza from Nasopharyngeal swab specimens and should not be used as a sole basis for treatment. Nasal washings and aspirates are unacceptable for Xpert Xpress  SARS-CoV-2/FLU/RSV testing.  Fact Sheet for Patients: BloggerCourse.comhttps://www.fda.gov/media/152166/download  Fact Sheet for Healthcare Providers: SeriousBroker.ithttps://www.fda.gov/media/152162/download  This test is not yet approved or cleared by the [place] FDA and has been authorized for detection and/or diagnosis of SARS-CoV-2 by FDA under an Emergency Use Authorization (EUA). This EUA will remain in effect (meaning this test can be used) for the duration of the COVID-19 declaration under Section 564(b)(1) of the Act, 21 U.S.C. section 360bbb-3(b)(1), unless the authorization is terminated or revoked.  Performed at [facility], [address] [place], [place] [address]   MRSA PCR Screening     Status: None   Collection Time: 07/08/20  3:00 AM   Specimen: Nasal Mucosa; Nasopharyngeal  Result Value Ref Range Status   MRSA by PCR NEGATIVE NEGATIVE Final    Comment:        The GeneXpert MRSA Assay (FDA approved for NASAL specimens only), is one component of a comprehensive MRSA colonization surveillance program. It is not intended to diagnose MRSA infection nor to guide  or monitor treatment for MRSA infections. Performed at [facility], [address], [place], [place] [address]      Discharge Instructions:   Discharge Instructions    Amb Referral to Cardiac Rehabilitation   Complete by: As directed    Diagnosis:  PTCA Coronary Stents     After initial evaluation and assessments completed: Virtual Based Care may be provided alone or in conjunction with Phase 2 Cardiac Rehab based on patient barriers.: Yes   Diet - low sodium heart healthy   Complete by: As directed    Increase activity slowly   Complete by: As directed      Allergies as of 07/11/2020      Reactions   Codeine Nausea And Vomiting      Medication List    TAKE these medications   aspirin 81 MG chewable tablet Chew 1 tablet (81 mg total) by mouth daily. What changed: when to take this    atorvastatin 80 MG tablet Commonly known as: LIPITOR TAKE ONE (1) TABLET BY MOUTH EACH EVENING AT 6PM What changed: See the new instructions.   clonazePAM 0.5 MG tablet Commonly known as: KLONOPIN TAKE 1 TABLET BY MOUTH EVERY DAY AT BEDTIME AS NEEDED What changed: See the new instructions.   clopidogrel 75 MG tablet Commonly known as: PLAVIX TAKE ONE TABLET BY MOUTH EACH MORNING WITH BREAKFAST What changed: See the new instructions.   Fish Oil 1000 MG Caps Take 2 capsules by mouth daily.   isosorbide mononitrate 30 MG 24 hr tablet Commonly known as: IMDUR Take 1 tablet (30 mg total) by mouth daily. Start taking on: July 12, 2020   lisinopril 5 MG tablet Commonly known as: ZESTRIL TAKE 1/2 TABLET BY MOUTH EVERY DAY   pantoprazole 20 MG tablet Commonly known as: PROTONIX TAKE 1 TABLET BY MOUTH EVERY DAY AS NEEDED         Time coordinating discharge: 35 min  Signed:  Joseph Art DO  [facility] 07/11/2020, 12:01 PM

## 2020-07-11 NOTE — Plan of Care (Signed)
Discussed with patient plan of care for the evening, pain management and hematoma around TR band site with some teach back displayed.  Problem: Education: Goal: Knowledge of General Education information will improve Description: Including pain rating scale, medication(s)/side effects and non-pharmacologic comfort measures Outcome: Progressing   Problem: Pain Managment: Goal: General experience of comfort will improve Outcome: Progressing

## 2020-07-11 NOTE — H&P (View-Only) (Signed)
Progress Note  Patient Name: Franklin Marks Date of Encounter: 07/11/2020  Primary Cardiologist: Lorine Bears, MD   Subjective   Doing well today. No chest pain. Plan for elective staged LAD intervention. Will discuss timing with rounding MD    Inpatient Medications    Scheduled Meds: . aspirin  81 mg Oral Daily  . atorvastatin  80 mg Oral Daily  . clonazePAM  0.5 mg Oral Daily  . clopidogrel  75 mg Oral Daily  . isosorbide mononitrate  30 mg Oral Daily  . lisinopril  2.5 mg Oral Daily  . pantoprazole  20 mg Oral Daily  . sodium chloride flush  3 mL Intravenous Q12H   Continuous Infusions: . sodium chloride Stopped (07/10/20 2041)   PRN Meds: sodium chloride, acetaminophen, clonazePAM, ondansetron (ZOFRAN) IV, sodium chloride flush   Vital Signs    Vitals:   07/11/20 0524 07/11/20 0620 07/11/20 0741 07/11/20 0753  BP:   (!) 141/71   Pulse:   (!) 56   Resp: 20 14 15    Temp:   98 F (36.7 C)   TempSrc:   Oral   SpO2:    98%  Weight:      Height:        Intake/Output Summary (Last 24 hours) at 07/11/2020 0846 Last data filed at 07/11/2020 0600 Gross per 24 hour  Intake 483 ml  Output 1301 ml  Net -818 ml   Filed Weights   07/07/20 2214 07/08/20 0120 07/11/20 0315  Weight: 77.1 kg 76.2 kg 76.3 kg    Physical Exam   General: Well developed, well nourished, NAD Neck: Negative for carotid bruits. No JVD Lungs:Clear to ausculation bilaterally. No wheezes, rales, or rhonchi. Breathing is unlabored. Cardiovascular: RRR with S1 S2. No murmurs Abdomen: Soft, non-tender, non-distended. No obvious abdominal masses. Extremities: No edema. Radial pulses 2+ bilaterally Neuro: Alert and oriented. No focal deficits. No facial asymmetry. MAE spontaneously. Psych: Responds to questions appropriately with normal affect.    Labs    Chemistry Recent Labs  Lab 07/09/20 0555 07/10/20 0040 07/11/20 0108  NA 136 137 137  K 4.1 3.7 3.5  CL 107 108 106  CO2 22 23  23   GLUCOSE 109* 103* 94  BUN 14 13 11   CREATININE 0.85 0.89 0.90  CALCIUM 8.8* 8.6* 8.6*  GFRNONAA >60 >60 >60  ANIONGAP 7 6 8      Hematology Recent Labs  Lab 07/09/20 0555 07/10/20 0040 07/11/20 0108  WBC 7.9 6.9 7.0  RBC 4.07* 3.91* 3.84*  HGB 12.5* 12.1* 11.8*  HCT 37.2* 35.6* 34.7*  MCV 91.4 91.0 90.4  MCH 30.7 30.9 30.7  MCHC 33.6 34.0 34.0  RDW 13.0 13.1 12.9  PLT 172 167 173    Cardiac EnzymesNo results for input(s): TROPONINI in the last 168 hours. No results for input(s): TROPIPOC in the last 168 hours.   BNPNo results for input(s): BNP, PROBNP in the last 168 hours.   DDimer No results for input(s): DDIMER in the last 168 hours.   Radiology    CARDIAC CATHETERIZATION  Result Date: 07/10/2020  Ost LAD lesion is 100% stenosed. LIMA to LAD is patent.  Dist LAD-1 lesion is 70% stenosed.  SVG to first diagonal is occluded.  Mid Cx lesion is 30% stenosed.  Ost RCA lesion is 100% stenosed. SVG to PDA is patent with 80% proximal stenosis.  A drug-eluting stent was successfully placed using a SYNERGY XD 4.50X12, postdilated to > 5 mm and  optimized with IVUS.  Post intervention, there is a 0% residual stenosis.  RPAV lesion is occluded with left to right collaterals.  Patent stent in SVG to second diagonal.  Mid LM to Ost LAD lesion is 75% stenosed. This has progressed compared to the prior cath.  The left ventricular ejection fraction is 45-50% by visual estimate.  There is mild left ventricular systolic dysfunction. Anterolateral hypokinesis.  LV end diastolic pressure is normal.  There is no aortic valve stenosis.  Origin to Prox Graft lesion before RPAV is 80% stenosed.  And is normal in caliber.  The graft exhibits mild .  Continue aggressive secondary prevention.  Consider PCI of left main into circumflex tomorrow.  Could use right radial approach.   Telemetry    07/11/20 NSR/SB with HR in the 50-60's, paired PVCs and first degree AV block - Personally  Reviewed  ECG    No new tracing as of 07/11/20- Personally Reviewed  Cardiac Studies   LHC 07/10/20:   Ost LAD lesion is 100% stenosed. LIMA to LAD is patent.  Dist LAD-1 lesion is 70% stenosed.  SVG to first diagonal is occluded.  Mid Cx lesion is 30% stenosed.  Ost RCA lesion is 100% stenosed. SVG to PDA is patent with 80% proximal stenosis.  A drug-eluting stent was successfully placed using a SYNERGY XD 4.50X12, postdilated to > 5 mm and optimized with IVUS.  Post intervention, there is a 0% residual stenosis.  RPAV lesion is occluded with left to right collaterals.  Patent stent in SVG to second diagonal.  Mid LM to Ost LAD lesion is 75% stenosed. This has progressed compared to the prior cath.  The left ventricular ejection fraction is 45-50% by visual estimate.  There is mild left ventricular systolic dysfunction. Anterolateral hypokinesis.  LV end diastolic pressure is normal.  There is no aortic valve stenosis.  Origin to Prox Graft lesion before RPAV is 80% stenosed.  And is normal in caliber.  The graft exhibits mild .   Continue aggressive secondary prevention.  Consider PCI of left main into circumflex tomorrow.  Could use right radial approach.  Diagnostic Dominance: Right    Intervention      Echo 07/08/20:  1. Left ventricular ejection fraction, by estimation, is 55 to 60%. The  left ventricle has normal function. The left ventricle has no regional  wall motion abnormalities. Left ventricular diastolic parameters were  normal.  2. Right ventricular systolic function is normal. The right ventricular  size is normal. Tricuspid regurgitation signal is inadequate for assessing  PA pressure.  3. The mitral valve is normal in structure. Trivial mitral valve  regurgitation. No evidence of mitral stenosis.  4. The aortic valve is tricuspid. Aortic valve regurgitation is not  visualized. Mild aortic valve sclerosis is present, with no  evidence of  aortic valve stenosis.  5. The inferior vena cava is normal in size with <50% respiratory  variability, suggesting right atrial pressure of 8 mmHg.   Patient Profile     70 y.o. male with a hx of coronary artery disease status post coronary artery bypass graft, ischemic cardiomyopathy improved, remote paroxysmal atrial fibrillation, history of bradycardia, peripheral vascular disease, hypertension, hyperlipidemiaadmitted withunstable angina. Patient is status post coronary artery bypass graft in 2001. He has had PCI most recently in December 2016.  Assessment & Plan    1. Unstable angina: -Noted to have residual severe native LM stenosis into the LCx per cath 07/10/20>>recommendations are for staged PCI. Plan  to discharge home today with scheduled PCI (initial plan was for PCI today however patient prefers to go home and return at a later time). Films reviewed by Dr. Excell Seltzer which showed no high risk features.  -Continue ASA, Plavix, statin, Imdur  -No beta blocker with bradycardia  -Will discuss timing with rounding MD  -Denies recurrent chest pain   2. Bradycardia: -Pt noted to have intermittent Mobitz 1 heart block>>asymptomatic -HRs in the high 50's-low 60's   3. HLD: -LDL, 42 on 07/08/20 -Continue high intensity statin   4. HTN: -Stable, 141/71>>105/62>>99/59 -Continue current regimen    Signed, Georgie Chard NP-C [facility] Pager: [phone] 07/11/2020, 8:46 AM   For questions or updates, please contact   Please consult [email or web address] for contact info under Cardiology/STEMI.  Patient seen, examined. Available data reviewed. Agree with findings, assessment, and plan as outlined by Georgie Chard, NP-C.  The patient is independently interviewed and examined.  He is alert, oriented, in no distress.  Lungs are clear, JVP is normal, heart is regular rate and rhythm with distant heart sounds, no murmur gallop.  The left arm has diffuse ecchymoses but no firm  hematoma.  There is no lower extremity edema.  Cardiac catheterization films from yesterday's procedure reviewed.  Plans for elective, staged PCI in the future noted.  I discussed the patient's case with his primary cardiologist, Dr. Kirke Corin.  Plan to bring him back for elective PCI Jul 26, 2020.  His medical program is reviewed as above.  He is not a candidate for a beta-blocker because of bradycardia and Mobitz 1 heart block.  He is asymptomatic and does not meet indication for permanent pacing.  He remains on aspirin, clopidogrel, high intensity statin drug, and isosorbide.  The patient is medically stable for discharge today.  He will be given instructions on returning for outpatient PCI.  It is possible that he will require atherectomy or shockwave lithotripsy for treatment of a calcified protected distal left main/ostial circumflex lesion.  Of note, the patient had a lot of problems with restless legs during his cardiac catheterization procedure.  He requested to be given Klonopin before the procedure next time.  This is worked very well for him in the past.  Tonny Bollman, M.D. 07/11/2020 11:07 AM

## 2020-07-11 NOTE — Progress Notes (Signed)
CARDIAC REHAB PHASE I   PRE:  Rate/Rhythm: 59 SB asleep, awake 67 SR    BP: sitting 121/75    SaO2: 98 RA  MODE:  Ambulation: 340 ft   POST:  Rate/Rhythm: 69 SR, some dropped beats after walk, HR 55 SB    BP: sitting 137/84     SaO2:   Tolerated well, no angina. HR stable walking but did start having dropped beats occasionally after sitting. Pt asx. Discussed stent, restrictions, Plavix, diet, walking, NTG and CRPII. Pt voiced understanding and requests referral to [facility], which he has never done before. Did not give advanced exercise guidelines right now, will await future PCI to progress.  [id number]   Harriet Masson CES, ACSM 07/11/2020 10:33 AM

## 2020-07-12 NOTE — Telephone Encounter (Signed)
Spoke with the patient. Patient adv that he will need a COVID test prior to his 07/26/20 procedure with Dr. Kirke Corin. Adv the patient that he is scheduled for a COVID test at the [facility] arts pre-admit testing office on Mon 07/24/20 between 8am-1pm. Adv the patient that he will need to park and walk in to the office. Their office is located on the first floor the first office to the right of the entrance.  Patient verbalized understanding and voiced appreciation for the call.

## 2020-07-24 ENCOUNTER — Other Ambulatory Visit
Admission: RE | Admit: 2020-07-24 | Discharge: 2020-07-24 | Disposition: A | Discharge status: Home / Self Care | Payer: PPO | Source: Ambulatory Visit | Attending: Cardiovascular Disease | Admitting: Cardiovascular Disease

## 2020-07-24 ENCOUNTER — Other Ambulatory Visit: Payer: Self-pay

## 2020-07-24 DIAGNOSIS — Z20822 Contact with and (suspected) exposure to covid-19: Secondary | ICD-10-CM | POA: Insufficient documentation

## 2020-07-24 DIAGNOSIS — Z01812 Encounter for preprocedural laboratory examination: Secondary | ICD-10-CM | POA: Diagnosis not present

## 2020-07-24 LAB — SARS CORONAVIRUS 2 (TAT 6-24 HRS): SARS Coronavirus 2: NEGATIVE

## 2020-07-25 ENCOUNTER — Telehealth: Payer: Self-pay | Admitting: *Deleted

## 2020-07-25 NOTE — Telephone Encounter (Signed)
Pt contacted pre-coronary stent scheduled at [facility] for: Wednesday Jul 26, 2020 11:30 AM Verified arrival time and place: [facility] Main Entrance A [facility]) at: 9:30 AM   No solid food after midnight prior to cath, clear liquids until 5 AM day of procedure.  AM meds can be  taken pre-cath with sips of water including: ASA 81 mg Plavix 75 mg   Confirmed patient has responsible adult to drive home post procedure and be with patient first 24 hours after arriving home: yes  You are allowed ONE visitor in the waiting room during the time you are at the hospital for your procedure. Both you and your visitor must wear a mask once you enter the hospital.    Reviewed procedure/mask/visitor instructions with patient.

## 2020-07-26 ENCOUNTER — Other Ambulatory Visit: Payer: Self-pay

## 2020-07-26 ENCOUNTER — Encounter (HOSPITAL_COMMUNITY)
Admission: RE | Disposition: A | Discharge status: Home / Self Care | Payer: Self-pay | Source: Home / Self Care | Attending: Cardiovascular Disease

## 2020-07-26 ENCOUNTER — Ambulatory Visit (HOSPITAL_COMMUNITY)
Admission: RE | Admit: 2020-07-26 | Discharge: 2020-07-26 | Disposition: A | Discharge status: Home / Self Care | Payer: PPO | Attending: Cardiovascular Disease | Admitting: Cardiovascular Disease

## 2020-07-26 DIAGNOSIS — Z7982 Long term (current) use of aspirin: Secondary | ICD-10-CM | POA: Diagnosis not present

## 2020-07-26 DIAGNOSIS — I25110 Atherosclerotic heart disease of native coronary artery with unstable angina pectoris: Secondary | ICD-10-CM | POA: Insufficient documentation

## 2020-07-26 DIAGNOSIS — Z955 Presence of coronary angioplasty implant and graft: Secondary | ICD-10-CM | POA: Diagnosis not present

## 2020-07-26 DIAGNOSIS — I10 Essential (primary) hypertension: Secondary | ICD-10-CM | POA: Diagnosis not present

## 2020-07-26 DIAGNOSIS — I200 Unstable angina: Secondary | ICD-10-CM | POA: Diagnosis present

## 2020-07-26 DIAGNOSIS — E785 Hyperlipidemia, unspecified: Secondary | ICD-10-CM | POA: Diagnosis not present

## 2020-07-26 DIAGNOSIS — Z7902 Long term (current) use of antithrombotics/antiplatelets: Secondary | ICD-10-CM | POA: Diagnosis not present

## 2020-07-26 DIAGNOSIS — R001 Bradycardia, unspecified: Secondary | ICD-10-CM | POA: Insufficient documentation

## 2020-07-26 DIAGNOSIS — Z885 Allergy status to narcotic agent status: Secondary | ICD-10-CM | POA: Diagnosis not present

## 2020-07-26 DIAGNOSIS — Z95820 Peripheral vascular angioplasty status with implants and grafts: Secondary | ICD-10-CM

## 2020-07-26 DIAGNOSIS — I441 Atrioventricular block, second degree: Secondary | ICD-10-CM | POA: Insufficient documentation

## 2020-07-26 DIAGNOSIS — Z79899 Other long term (current) drug therapy: Secondary | ICD-10-CM | POA: Diagnosis not present

## 2020-07-26 DIAGNOSIS — G2581 Restless legs syndrome: Secondary | ICD-10-CM | POA: Diagnosis not present

## 2020-07-26 DIAGNOSIS — I2582 Chronic total occlusion of coronary artery: Secondary | ICD-10-CM | POA: Insufficient documentation

## 2020-07-26 HISTORY — PX: INTRAVASCULAR IMAGING/OCT: CATH118326

## 2020-07-26 HISTORY — PX: CORONARY STENT INTERVENTION: CATH118234

## 2020-07-26 LAB — POCT ACTIVATED CLOTTING TIME: Activated Clotting Time: 339 seconds

## 2020-07-26 SURGERY — CORONARY STENT INTERVENTION
Anesthesia: LOCAL

## 2020-07-26 MED ORDER — MIDAZOLAM HCL 2 MG/2ML IJ SOLN
INTRAMUSCULAR | Status: AC
  Filled 2020-07-26: qty 2

## 2020-07-26 MED ORDER — SODIUM CHLORIDE 0.9 % IV SOLN: 250.0000 mL | INTRAVENOUS | Status: DC | PRN

## 2020-07-26 MED ORDER — HEPARIN SODIUM (PORCINE) 1000 UNIT/ML IJ SOLN
INTRAMUSCULAR | Status: DC | PRN
Start: 2020-07-26 — End: 2020-07-26
  Administered 2020-07-26: 2000 [IU] via INTRAVENOUS
  Administered 2020-07-26: 7000 [IU] via INTRAVENOUS

## 2020-07-26 MED ORDER — ONDANSETRON HCL 4 MG/2ML IJ SOLN: 4.0000 mg | Freq: Four times a day (QID) | INTRAMUSCULAR | Status: DC | PRN

## 2020-07-26 MED ORDER — FENTANYL CITRATE (PF) 100 MCG/2ML IJ SOLN
INTRAMUSCULAR | Status: DC | PRN
  Administered 2020-07-26: 50 ug via INTRAVENOUS

## 2020-07-26 MED ORDER — CLONAZEPAM 0.5 MG PO TABS: 0.5000 mg | ORAL_TABLET | Freq: Once | ORAL | Status: DC

## 2020-07-26 MED ORDER — MIDAZOLAM HCL 2 MG/2ML IJ SOLN
INTRAMUSCULAR | Status: DC | PRN
  Administered 2020-07-26: 1 mg via INTRAVENOUS

## 2020-07-26 MED ORDER — ATORVASTATIN CALCIUM 80 MG PO TABS
80.0000 mg | ORAL_TABLET | Freq: Every evening | ORAL | Status: DC
  Administered 2020-07-26: 80 mg via ORAL
  Filled 2020-07-26: qty 1

## 2020-07-26 MED ORDER — CLONAZEPAM 0.5 MG PO TABS
0.5000 mg | ORAL_TABLET | Freq: Once | ORAL | Status: AC
  Administered 2020-07-26: 0.5 mg via ORAL
  Filled 2020-07-26: qty 1

## 2020-07-26 MED ORDER — IOHEXOL 350 MG/ML SOLN
INTRAVENOUS | Status: DC | PRN
  Administered 2020-07-26: 90 mL via INTRA_ARTERIAL

## 2020-07-26 MED ORDER — ISOSORBIDE MONONITRATE ER 30 MG PO TB24
30.0000 mg | ORAL_TABLET | Freq: Every day | ORAL | Status: DC
  Administered 2020-07-26: 30 mg via ORAL
  Filled 2020-07-26: qty 1

## 2020-07-26 MED ORDER — ASPIRIN 81 MG PO CHEW: 81.0000 mg | CHEWABLE_TABLET | ORAL | Status: DC

## 2020-07-26 MED ORDER — SODIUM CHLORIDE 0.9% FLUSH: 3.0000 mL | INTRAVENOUS | Status: DC | PRN

## 2020-07-26 MED ORDER — CLOPIDOGREL BISULFATE 300 MG PO TABS
ORAL_TABLET | ORAL | Status: DC | PRN
  Administered 2020-07-26: 300 mg via ORAL

## 2020-07-26 MED ORDER — SODIUM CHLORIDE 0.9% FLUSH: 3.0000 mL | Freq: Two times a day (BID) | INTRAVENOUS | Status: DC

## 2020-07-26 MED ORDER — SODIUM CHLORIDE 0.9 % WEIGHT BASED INFUSION: 1.0000 mL/kg/h | INTRAVENOUS | Status: DC

## 2020-07-26 MED ORDER — ACETAMINOPHEN 325 MG PO TABS: 650.0000 mg | ORAL_TABLET | ORAL | Status: DC | PRN

## 2020-07-26 MED ORDER — NITROGLYCERIN 1 MG/10 ML FOR IR/CATH LAB
INTRA_ARTERIAL | Status: AC
  Filled 2020-07-26: qty 10

## 2020-07-26 MED ORDER — PANTOPRAZOLE SODIUM 20 MG PO TBEC
20.0000 mg | DELAYED_RELEASE_TABLET | Freq: Every day | ORAL | Status: DC
  Administered 2020-07-26: 20 mg via ORAL
  Filled 2020-07-26: qty 1

## 2020-07-26 MED ORDER — SODIUM CHLORIDE 0.9 % WEIGHT BASED INFUSION
3.0000 mL/kg/h | INTRAVENOUS | Status: AC
  Administered 2020-07-26: 3 mL/kg/h via INTRAVENOUS

## 2020-07-26 MED ORDER — VERAPAMIL HCL 2.5 MG/ML IV SOLN
INTRAVENOUS | Status: DC | PRN
  Administered 2020-07-26: 10 mL via INTRA_ARTERIAL

## 2020-07-26 MED ORDER — LIDOCAINE HCL (PF) 1 % IJ SOLN
INTRAMUSCULAR | Status: DC | PRN
  Administered 2020-07-26: 20 mL

## 2020-07-26 MED ORDER — LIDOCAINE HCL (PF) 1 % IJ SOLN
INTRAMUSCULAR | Status: AC
  Filled 2020-07-26: qty 30

## 2020-07-26 MED ORDER — NITROGLYCERIN 1 MG/10 ML FOR IR/CATH LAB
INTRA_ARTERIAL | Status: DC | PRN
  Administered 2020-07-26: 200 ug via INTRACORONARY

## 2020-07-26 MED ORDER — HEPARIN SODIUM (PORCINE) 1000 UNIT/ML IJ SOLN
INTRAMUSCULAR | Status: AC
  Filled 2020-07-26: qty 1

## 2020-07-26 MED ORDER — LISINOPRIL 2.5 MG PO TABS
2.5000 mg | ORAL_TABLET | Freq: Every day | ORAL | Status: DC
  Administered 2020-07-26: 2.5 mg via ORAL
  Filled 2020-07-26: qty 1

## 2020-07-26 MED ORDER — ASPIRIN 81 MG PO CHEW: 81.0000 mg | CHEWABLE_TABLET | Freq: Every day | ORAL | Status: DC

## 2020-07-26 MED ORDER — FENTANYL CITRATE (PF) 100 MCG/2ML IJ SOLN
INTRAMUSCULAR | Status: AC
  Filled 2020-07-26: qty 2

## 2020-07-26 MED ORDER — HEPARIN (PORCINE) IN NACL 1000-0.9 UT/500ML-% IV SOLN
INTRAVENOUS | Status: DC | PRN
  Administered 2020-07-26 (×2): 500 mL

## 2020-07-26 MED ORDER — SODIUM CHLORIDE 0.9 % IV SOLN
250.0000 mL | INTRAVENOUS | Status: DC | PRN
Start: 2020-07-26 — End: 2020-07-27

## 2020-07-26 MED ORDER — CLOPIDOGREL BISULFATE 75 MG PO TABS: 75.0000 mg | ORAL_TABLET | Freq: Every day | ORAL | Status: DC

## 2020-07-26 MED ORDER — HEPARIN (PORCINE) IN NACL 1000-0.9 UT/500ML-% IV SOLN
INTRAVENOUS | Status: AC
  Filled 2020-07-26: qty 1000

## 2020-07-26 MED ORDER — CLOPIDOGREL BISULFATE 300 MG PO TABS
ORAL_TABLET | ORAL | Status: AC
  Filled 2020-07-26: qty 1

## 2020-07-26 SURGICAL SUPPLY — 16 items
BALLN EMERGE MR 2.5X12 (BALLOONS) ×2
BALLN SAPPHIRE ~~LOC~~ 3.25X8 (BALLOONS) ×1 IMPLANT
BALLOON EMERGE MR 2.5X12 (BALLOONS) IMPLANT
CATH DRAGONFLY OPTIS 2.7FR (CATHETERS) ×1 IMPLANT
CATH LAUNCHER 6FR EBU3.5 (CATHETERS) ×1 IMPLANT
DEVICE RAD COMP TR BAND LRG (VASCULAR PRODUCTS) ×1 IMPLANT
GLIDESHEATH SLEND SS 6F .021 (SHEATH) ×1 IMPLANT
GUIDEWIRE INQWIRE 1.5J.035X260 (WIRE) IMPLANT
INQWIRE 1.5J .035X260CM (WIRE) ×2
KIT ENCORE 26 ADVANTAGE (KITS) ×1 IMPLANT
KIT HEART LEFT (KITS) ×2 IMPLANT
PACK CARDIAC CATHETERIZATION (CUSTOM PROCEDURE TRAY) ×2 IMPLANT
STENT RESOLUTE ONYX 2.5X15 (Permanent Stent) ×1 IMPLANT
TRANSDUCER W/STOPCOCK (MISCELLANEOUS) ×2 IMPLANT
TUBING CIL FLEX 10 FLL-RA (TUBING) ×3 IMPLANT
WIRE RUNTHROUGH .014X180CM (WIRE) ×1 IMPLANT

## 2020-07-26 NOTE — Discharge Instructions (Signed)
Radial Site Care  This sheet gives you information about how to care for yourself after your procedure. Your health care provider may also give you more specific instructions. If you have problems or questions, contact your health care provider. What can I expect after the procedure? After the procedure, it is common to have:  Bruising and tenderness at the catheter insertion area. Follow these instructions at home: Medicines  Take over-the-counter and prescription medicines only as told by your health care provider. Insertion site care  Follow instructions from your health care provider about how to take care of your insertion site. Make sure you: ? Wash your hands with soap and water before you change your bandage (dressing). If soap and water are not available, use hand sanitizer. ? Change your dressing as told by your health care provider. ? Leave stitches (sutures), skin glue, or adhesive strips in place. These skin closures may need to stay in place for 2 weeks or longer. If adhesive strip edges start to loosen and curl up, you may trim the loose edges. Do not remove adhesive strips completely unless your health care provider tells you to do that.  Check your insertion site every day for signs of infection. Check for: ? Redness, swelling, or pain. ? Fluid or blood. ? Pus or a bad smell. ? Warmth.  Do not take baths, swim, or use a hot tub until your health care provider approves.  You may shower 24-48 hours after the procedure, or as directed by your health care provider. ? Remove the dressing and gently wash the site with plain soap and water. ? Pat the area dry with a clean towel. ? Do not rub the site. That could cause bleeding.  Do not apply powder or lotion to the site. Activity  For 24 hours after the procedure, or as directed by your health care provider: ? Do not flex or bend the affected arm. ? Do not push or pull heavy objects with the affected arm. ? Do not drive  yourself home from the hospital or clinic. You may drive 24 hours after the procedure unless your health care provider tells you not to. ? Do not operate machinery or power tools.  Do not lift anything that is heavier than 10 lb (4.5 kg), or the limit that you are told, until your health care provider says that it is safe.  Ask your health care provider when it is okay to: ? Return to work or school. ? Resume usual physical activities or sports. ? Resume sexual activity.   General instructions  If the catheter site starts to bleed, raise your arm and put firm pressure on the site. If the bleeding does not stop, get help right away. This is a medical emergency.  If you went home on the same day as your procedure, a responsible adult should be with you for the first 24 hours after you arrive home.  Keep all follow-up visits as told by your health care provider. This is important. Contact a health care provider if:  You have a fever.  You have redness, swelling, or yellow drainage around your insertion site. Get help right away if:  You have unusual pain at the radial site.  The catheter insertion area swells very fast.  The insertion area is bleeding, and the bleeding does not stop when you hold steady pressure on the area.  Your arm or hand becomes pale, cool, tingly, or numb. These symptoms may represent a serious   problem that is an emergency. Do not wait to see if the symptoms will go away. Get medical help right away. Call your local emergency services (911 in the [place] Do not drive yourself to the hospital. Summary  After the procedure, it is common to have bruising and tenderness at the site.  Follow instructions from your health care provider about how to take care of your radial site wound. Check the wound every day for signs of infection.  Do not lift anything that is heavier than 10 lb (4.5 kg), or the limit that you are told, until your health care provider says that it  is safe. This information is not intended to replace advice given to you by your health care provider. Make sure you discuss any questions you have with your health care provider. Document Revised: 04/09/2017 Document Reviewed: 04/09/2017 Elsevier Patient Education  2021 [employer]  

## 2020-07-26 NOTE — Interval H&P Note (Signed)
Cath Lab Visit (complete for each Cath Lab visit)  Clinical Evaluation Leading to the Procedure:   ACS: Yes.   . Staged.   Non-ACS:  n/a    History and Physical Interval Note:  07/26/2020 11:46 AM  Franklin Marks  has presented today for surgery, with the diagnosis of cad.  The various methods of treatment have been discussed with the patient and family. After consideration of risks, benefits and other options for treatment, the patient has consented to  Procedure(s): CORONARY STENT INTERVENTION (N/A) as a surgical intervention.  The patient's history has been reviewed, patient examined, no change in status, stable for surgery.  I have reviewed the patient's chart and labs.  Questions were answered to the patient's satisfaction.     Lorine Bears

## 2020-07-26 NOTE — Progress Notes (Addendum)
Laverda Page, NP in to see client and notified of monitor showing Mobitz 1 AV block

## 2020-07-26 NOTE — Discharge Summary (Signed)
Discharge Summary for Same Day PCI   Patient ID: Franklin Marks MRN: [id number]; DOB: [birth year]  Admit date: 07/26/2020 Discharge date: 07/26/2020  Primary Care Provider: [name], MD  Primary Cardiologist: Lorine Bears, MD  Primary Electrophysiologist:  None   Discharge Diagnoses    Principal Problem:   Unstable angina [facility]) Active Problems:   Sinus bradycardia   Hyperlipidemia   Mobitz (type) [facility]) atrioventricular block    Diagnostic Studies/Procedures    Cardiac Catheterization 07/26/2020:   Suezanne Jacquet LAD lesion is 100% stenosed.  Dist LAD-1 lesion is 70% stenosed.  Dist LAD-2 lesion is 70% stenosed.  Mid Cx lesion is 30% stenosed.  Ost Cx to Prox Cx lesion is 85% stenosed.  Post intervention, there is a 0% residual stenosis.  A drug-eluting stent was successfully placed using a STENT RESOLUTE ONYX 2.5X15.   Successful OCT guided angioplasty and drug-eluting stent placement to the ostial left circumflex/distal left main.  Recommendations: Continue dual antiplatelet therapy for at least 6 months.  Aggressive treatment of risk factors. The patient is a candidate for same-day discharge.  Diagnostic Dominance: Right    Intervention     _____________   History of Present Illness     Franklin Marks is a 70 y.o. male with a history of CAD s/p remote CABG in 2001 with subsequent DES to SVT to 2nd Diag in 02/2015, ischemic cardiomyopathy/chronic systolic CHF with improved EF of 50-55% in 10/2019, paroxysmal atrial fibrillation not on anticoagulation, symptomatic bradycardia, PAD with CTO of bilateral SFAs with reconstitution distally via collaterals, hypertension, hyperlipidemia, and GERD.  He has a history of CAD with prior cardiac arrest s/p remote CABG in 2001. He presented with NSTEMI in 02/2015 and ultimately underwent PCI/DES to SVG to 2nd Diag. Also noted to have occluded SVG to 1st Diag at that time but SVG to RCA and LIMA to LAD were  patent. EF was 35-45% at that time. Last Echo in 10/2019 showed improved EF of 50-55%. Patient also has known PAD with CTO of bilateral SFAs with reconstitution distally via collaterals. Last ABIs in 05/2016 were stable. He has also had some symptomatic bradycardia in the past and beta-blocker had to be discontinued. He was last seen by Dr. Kirke Corin in 04/2020 at which time he was doing well from a cardiac standpoint. He did describes some stable bilateral calf claudication but this was not lifestyle limiting. He was continue on medical therapy.   Patient presented to the ED on 07/07/2020 with chest pain. Worked up revealed severe native LM stenosis into the the Lcx, patent LIMA-LAD, SVG-PDA with 80% stenosis (treated with PCI/DES x1 and IVUS), patent SVG-2nd diag. Plans for staged PCI for LM into Lcx as outpatient as the patient preferred to go home and come back.  Plan for DAPT with ASA/plavix for at least 12 months. Cath films were reviewed with no high risk features and he was discharged with plans for outpatient cath.   Hospital Course     The patient underwent cardiac cath as noted above with successful OTC guided angioplasty with PCI/DES x1 to ostial Lcx and distal LM. Plan for DAPT with ASA/plavix for at least 6 months. The patient was seen by cardiac rehab while in short stay. There were no observed complications post cath. Radial cath site was re-evaluated prior to discharge and found to be stable without any complications. Instructions/precautions regarding cath site care were given prior to discharge. Called by RN with concern regarding bradycardia on telemetry. He  has a known hx of 1st degree AVB with intermittent Mobitz 1 with documented HRs in the 30-40s at times. Review of telemetry shows no afib, but SB with 1st degree AVB and Mobitz 1. At the time of HR in the 30s he was noted to be sleeping, actually snoring. No documented hx of OSA, would consider work up with sleep study as an outpatient. He  remains asymptomatic with episodes of bradycardia. Not on any AVN blocking agents. No indication for pacing at this time.   [name] was seen by Dr. Kirke Corin and determined stable for discharge home. Follow up with our office has been arranged. Medications are listed below. Pertinent changes include N/a .  _____________  Cath/PCI Registry Performance & Quality Measures: 1. Aspirin prescribed? - Yes 2. ADP Receptor Inhibitor (Plavix/Clopidogrel, Brilinta/Ticagrelor or Effient/Prasugrel) prescribed (includes medically managed patients)? - Yes 3. High Intensity Statin (Lipitor 40-80mg  or Crestor 20-40mg ) prescribed? - Yes 4. For EF <40%, was ACEI/ARB prescribed? - Yes 5. For EF <40%, Aldosterone Antagonist (Spironolactone or Eplerenone) prescribed? - Not Applicable (EF >/= 40%) 6. Cardiac Rehab Phase II ordered (Included Medically managed Patients)? - Yes  _____________   Discharge Vitals Blood pressure 119/63, pulse (!) 53, temperature 97.8 F (36.6 C), temperature source Oral, resp. rate 20, height 5\' 9"  (1.753 m), weight 76.2 kg, SpO2 98 %.  Filed Weights   07/26/20 0959  Weight: 76.2 kg    Last Labs & Radiologic Studies    CBC No results for input(s): WBC, NEUTROABS, HGB, HCT, MCV, PLT in the last 72 hours. Basic Metabolic Panel No results for input(s): NA, K, CL, CO2, GLUCOSE, BUN, CREATININE, CALCIUM, MG, PHOS in the last 72 hours. Liver Function Tests No results for input(s): AST, ALT, ALKPHOS, BILITOT, PROT, ALBUMIN in the last 72 hours. No results for input(s): LIPASE, AMYLASE in the last 72 hours. High Sensitivity Troponin:   Recent Labs  Lab 07/07/20 2205 07/08/20 0038  TROPONINIHS 10 10    BNP Invalid input(s): POCBNP D-Dimer No results for input(s): DDIMER in the last 72 hours. Hemoglobin A1C No results for input(s): HGBA1C in the last 72 hours. Fasting Lipid Panel No results for input(s): CHOL, HDL, LDLCALC, TRIG, CHOLHDL, LDLDIRECT in the last 72  hours. Thyroid Function Tests No results for input(s): TSH, T4TOTAL, T3FREE, THYROIDAB in the last 72 hours.  Invalid input(s): FREET3 _____________  DG Chest 2 View  Result Date: 07/07/2020 CLINICAL DATA:  Intermittent chest pain for 1 hour. Previous history of cardiac surgery. EXAM: CHEST - 2 VIEW COMPARISON:  03/14/2015 FINDINGS: Postoperative changes in the mediastinum. Heart size and pulmonary vascularity are normal. Lungs are clear. No pleural effusions. No pneumothorax. Emphysematous changes in the lungs. Degenerative changes in the spine. Old ununited fracture of the midshaft right clavicle. IMPRESSION: No active cardiopulmonary disease. Electronically Signed   By: 03/16/2015 M.D.   On: 07/07/2020 22:28   CARDIAC CATHETERIZATION  Result Date: 07/26/2020  Ost LAD lesion is 100% stenosed.  Dist LAD-1 lesion is 70% stenosed.  Dist LAD-2 lesion is 70% stenosed.  Mid Cx lesion is 30% stenosed.  Ost Cx to Prox Cx lesion is 85% stenosed.  Post intervention, there is a 0% residual stenosis.  A drug-eluting stent was successfully placed using a STENT RESOLUTE ONYX 2.5X15.  Successful OCT guided angioplasty and drug-eluting stent placement to the ostial left circumflex/distal left main. Recommendations: Continue dual antiplatelet therapy for at least 6 months.  Aggressive treatment of risk factors. The patient is  a candidate for same-day discharge.   CARDIAC CATHETERIZATION  Result Date: 07/10/2020  Ost LAD lesion is 100% stenosed. LIMA to LAD is patent.  Dist LAD-1 lesion is 70% stenosed.  SVG to first diagonal is occluded.  Mid Cx lesion is 30% stenosed.  Ost RCA lesion is 100% stenosed. SVG to PDA is patent with 80% proximal stenosis.  A drug-eluting stent was successfully placed using a SYNERGY XD 4.50X12, postdilated to > 5 mm and optimized with IVUS.  Post intervention, there is a 0% residual stenosis.  RPAV lesion is occluded with left to right collaterals.  Patent stent in  SVG to second diagonal.  Mid LM to Ost LAD lesion is 75% stenosed. This has progressed compared to the prior cath.  The left ventricular ejection fraction is 45-50% by visual estimate.  There is mild left ventricular systolic dysfunction. Anterolateral hypokinesis.  LV end diastolic pressure is normal.  There is no aortic valve stenosis.  Origin to Prox Graft lesion before RPAV is 80% stenosed.  And is normal in caliber.  The graft exhibits mild .  Continue aggressive secondary prevention.  Consider PCI of left main into circumflex tomorrow.  Could use right radial approach.   ECHOCARDIOGRAM COMPLETE  Result Date: 07/08/2020    ECHOCARDIOGRAM REPORT   Patient Name:   Franklin Marks Date of Exam: 07/08/2020 Medical Rec #:  [id number]     Height:       69.0 in Accession #:    [id number]    Weight:       168.0 lb Date of Birth:  [birth year]    BSA:          1.918 m Patient Age:    69 years      BP:           101/67 mmHg Patient Gender: M             HR:           46 bpm. Exam Location:  Inpatient Procedure: 2D Echo Indications:    chest pain  History:        Patient has prior history of Echocardiogram examinations, most                 recent 10/22/2019. Cardiomyopathy, Prior CABG, COPD; Risk                 Factors:Dyslipidemia.  Sonographer:    Delcie RochLauren Pennington Referring Phys: [id number]: [id number] CALLIE E GOODRICH IMPRESSIONS  1. Left ventricular ejection fraction, by estimation, is 55 to 60%. The left ventricle has normal function. The left ventricle has no regional wall motion abnormalities. Left ventricular diastolic parameters were normal.  2. Right ventricular systolic function is normal. The right ventricular size is normal. Tricuspid regurgitation signal is inadequate for assessing PA pressure.  3. The mitral valve is normal in structure. Trivial mitral valve regurgitation. No evidence of mitral stenosis.  4. The aortic valve is tricuspid. Aortic valve regurgitation is not visualized. Mild aortic valve sclerosis  is present, with no evidence of aortic valve stenosis.  5. The inferior vena cava is normal in size with <50% respiratory variability, suggesting right atrial pressure of 8 mmHg. FINDINGS  Left Ventricle: Left ventricular ejection fraction, by estimation, is 55 to 60%. The left ventricle has normal function. The left ventricle has no regional wall motion abnormalities. The left ventricular internal cavity size was normal in size. There is  no left ventricular hypertrophy. Left ventricular diastolic parameters were normal.  Normal left ventricular filling pressure. Right Ventricle: The right ventricular size is normal.Right ventricular systolic function is normal. Tricuspid regurgitation signal is inadequate for assessing PA pressure. Left Atrium: Left atrial size was normal in size. Right Atrium: Right atrial size was normal in size. Pericardium: There is no evidence of pericardial effusion. Mitral Valve: The mitral valve is normal in structure. Trivial mitral valve regurgitation. No evidence of mitral valve stenosis. Tricuspid Valve: The tricuspid valve is normal in structure. Tricuspid valve regurgitation is mild . No evidence of tricuspid stenosis. Aortic Valve: The aortic valve is tricuspid. Aortic valve regurgitation is not visualized. Mild aortic valve sclerosis is present, with no evidence of aortic valve stenosis. Pulmonic Valve: The pulmonic valve was normal in structure. Pulmonic valve regurgitation is trivial. No evidence of pulmonic stenosis. Aorta: The aortic root is normal in size and structure. Venous: The inferior vena cava is normal in size with less than 50% respiratory variability, suggesting right atrial pressure of 8 mmHg. IAS/Shunts: The interatrial septum appears to be lipomatous. No atrial level shunt detected by color flow Doppler.  LEFT VENTRICLE PLAX 2D LVIDd:         4.60 cm  Diastology LVIDs:         3.30 cm  LV e' medial:    7.62 cm/s LV PW:         1.00 cm  LV E/e' medial:  10.6 LV IVS:         0.80 cm  LV e' lateral:   9.68 cm/s LVOT diam:     2.00 cm  LV E/e' lateral: 8.3 LV SV:         93 LV SV Index:   49 LVOT Area:     3.14 cm  RIGHT VENTRICLE            IVC RV S prime:     9.57 cm/s  IVC diam: 1.80 cm TAPSE (M-mode): 1.7 cm LEFT ATRIUM             Index       RIGHT ATRIUM           Index LA diam:        4.10 cm 2.14 cm/m  RA Area:     13.90 cm LA Vol (A2C):   44.4 ml 23.14 ml/m RA Volume:   33.10 ml  17.25 ml/m LA Vol (A4C):   49.4 ml 25.75 ml/m LA Biplane Vol: 47.9 ml 24.97 ml/m  AORTIC VALVE LVOT Vmax:   130.00 cm/s LVOT Vmean:  73.800 cm/s LVOT VTI:    0.297 m  AORTA Ao Root diam: 3.10 cm Ao Asc diam:  3.60 cm MITRAL VALVE MV Area (PHT): 2.08 cm    SHUNTS MV Decel Time: 364 msec    Systemic VTI:  0.30 m MV E velocity: 80.60 cm/s  Systemic Diam: 2.00 cm MV A velocity: 69.00 cm/s MV E/A ratio:  1.17 Olga Millers MD Electronically signed by Olga Millers MD Signature Date/Time: 07/08/2020/3:08:54 PM    Final     Disposition   Pt is being discharged home today in good condition.  Follow-up Plans & Appointments     Follow-up Information    Creig Hines, NP Follow up on 08/04/2020.   Specialties: Nurse Practitioner, Cardiology, Radiology Why: at 8:50am for your follow up appt Contact information: [address] [place] [address] [phone]              Discharge Instructions  Amb Referral to Cardiac Rehabilitation   Complete by: As directed    Diagnosis: Coronary Stents   After initial evaluation and assessments completed: Virtual Based Care may be provided alone or in conjunction with Phase 2 Cardiac Rehab based on patient barriers.: Yes       Discharge Medications   Allergies as of 07/26/2020      Reactions   Codeine Nausea And Vomiting      Medication List    TAKE these medications   aspirin 81 MG chewable tablet Chew 1 tablet (81 mg total) by mouth daily. What changed: when to take this   atorvastatin  80 MG tablet Commonly known as: LIPITOR TAKE ONE (1) TABLET BY MOUTH EACH EVENING AT 6PM What changed: See the new instructions.   clonazePAM 0.5 MG tablet Commonly known as: KLONOPIN TAKE 1 TABLET BY MOUTH EVERY DAY AT BEDTIME AS NEEDED What changed: See the new instructions.   clopidogrel 75 MG tablet Commonly known as: PLAVIX TAKE ONE TABLET BY MOUTH EACH MORNING WITH BREAKFAST What changed: See the new instructions.   Fish Oil 1000 MG Caps Take 1,000 mg by mouth in the morning and at bedtime.   isosorbide mononitrate 30 MG 24 hr tablet Commonly known as: IMDUR Take 1 tablet (30 mg total) by mouth daily.   lisinopril 5 MG tablet Commonly known as: ZESTRIL TAKE 1/2 TABLET BY MOUTH EVERY DAY   pantoprazole 20 MG tablet Commonly known as: PROTONIX TAKE 1 TABLET BY MOUTH EVERY DAY AS NEEDED What changed:   how much to take  reasons to take this        Allergies Allergies  Allergen Reactions  . Codeine Nausea And Vomiting    Outstanding Labs/Studies   N/a   Duration of Discharge Encounter   Greater than 30 minutes including physician time.  Signed, Laverda Page, NP 07/26/2020, 4:25 PM

## 2020-07-26 NOTE — Progress Notes (Signed)
Franklin Marks, [place] in and ok to d/c home

## 2020-07-26 NOTE — Progress Notes (Signed)
[id number] Brief ed completed with pt and significant other as pt just seen by [place] 2 weeks ago. Reinforced importance of plavix with stent. Reviewed NTG use, gave walking instructions for ex and sent another referral to [facility]. Pt stated he has put heart healthy diet on refrigerator and looks at frequent when choosing foods.  Understanding of ed voiced. Luetta Nutting RN BSN 07/26/2020 2:37 PM

## 2020-07-27 ENCOUNTER — Encounter (HOSPITAL_COMMUNITY): Payer: Self-pay | Admitting: Cardiovascular Disease

## 2020-07-28 ENCOUNTER — Encounter: Payer: PPO | Admitting: Internal Medicine

## 2020-08-01 ENCOUNTER — Ambulatory Visit (INDEPENDENT_AMBULATORY_CARE_PROVIDER_SITE_OTHER): Payer: PPO | Admitting: Internal Medicine

## 2020-08-01 ENCOUNTER — Encounter: Payer: Self-pay | Admitting: Internal Medicine

## 2020-08-01 ENCOUNTER — Other Ambulatory Visit: Payer: Self-pay

## 2020-08-01 VITALS — BP 112/70 | HR 49 | Temp 97.3°F | Ht 67.25 in | Wt 174.0 lb

## 2020-08-01 DIAGNOSIS — Z0000 Encounter for general adult medical examination without abnormal findings: Secondary | ICD-10-CM | POA: Diagnosis not present

## 2020-08-01 DIAGNOSIS — J449 Chronic obstructive pulmonary disease, unspecified: Secondary | ICD-10-CM

## 2020-08-01 DIAGNOSIS — Z1211 Encounter for screening for malignant neoplasm of colon: Secondary | ICD-10-CM

## 2020-08-01 DIAGNOSIS — I739 Peripheral vascular disease, unspecified: Secondary | ICD-10-CM | POA: Diagnosis not present

## 2020-08-01 DIAGNOSIS — K219 Gastro-esophageal reflux disease without esophagitis: Secondary | ICD-10-CM

## 2020-08-01 DIAGNOSIS — I5022 Chronic systolic (congestive) heart failure: Secondary | ICD-10-CM

## 2020-08-01 DIAGNOSIS — I25119 Atherosclerotic heart disease of native coronary artery with unspecified angina pectoris: Secondary | ICD-10-CM

## 2020-08-01 MED ORDER — CLONAZEPAM 0.5 MG PO TABS: ORAL_TABLET | ORAL | 0 refills | Status: DC

## 2020-08-01 NOTE — Assessment & Plan Note (Signed)
No pulses but no claudication Quit smoking some years ago

## 2020-08-01 NOTE — Assessment & Plan Note (Signed)
Currently pain free on isosorbide and after recent cath/angioplasty

## 2020-08-01 NOTE — Assessment & Plan Note (Signed)
Compensated On statin, ASA beta blocker, ACEI

## 2020-08-01 NOTE — Assessment & Plan Note (Signed)
Minimal symptoms since stopping smoking No Rx

## 2020-08-01 NOTE — Assessment & Plan Note (Signed)
Recommended regular Rx (like every other day) to avoid chest pain (indistinguishable from angina)

## 2020-08-01 NOTE — Assessment & Plan Note (Signed)
I have personally reviewed the Medicare Annual Wellness questionnaire and have noted 1. The patient's medical and social history 2. Their use of alcohol, tobacco or illicit drugs 3. Their current medications and supplements 4. The patient's functional ability including ADL's, fall risks, home safety risks and hearing or visual             impairment. 5. Diet and physical activities 6. Evidence for depression or mood disorders  The patients weight, height, BMI and visual acuity have been recorded in the chart I have made referrals, counseling and provided education to the patient based review of the above and I have provided the pt with a written personalized care plan for preventive services.  I have provided you with a copy of your personalized plan for preventive services. Please take the time to review along with your updated medication list.  Will do FIT again--he said he did another from insurance, but not in our records Consider last PSA next year Consider shingrix at pharmacy Discussed exercise COVID booster again Flu vaccine in the fall

## 2020-08-01 NOTE — Progress Notes (Signed)
Hearing Screening   Method: Audiometry   125Hz 250Hz 500Hz 1000Hz 2000Hz 3000Hz 4000Hz 6000Hz 8000Hz  Right ear:   20 20 20  20    Left ear:   20 20 20  20      Visual Acuity Screening   Right eye Left eye Both eyes  Without correction: 20/40 20/30 20/25  With correction:       

## 2020-08-01 NOTE — Progress Notes (Signed)
Subjective:    Patient ID: Franklin Marks, male    DOB: Mar 16, 1951, 70 y.o.   MRN: [id number]  HPI Here for Medicare wellness visit and follow up of chronic health conditions This visit occurred during the SARS-CoV-2 public health emergency.  Safety protocols were in place, including screening questions prior to the visit, additional usage of staff PPE, and extensive cleaning of exam room while observing appropriate contact time as indicated for disinfecting solutions.   Reviewed advanced directives Reviewed other doctors--- Dr Arida--cardiologist No tobacco now Occasional beer Vision is okay--;last exam at [facility] Hearing is fine No falls No depression or anhedonia Independent with instrumental ADLs Plays golf and does yard work No memory problems  Admitted with unstable angina in April---had cath Back for the cath last week Had stent put in No chest pain now Breathing has been okay No dizziness or syncope No leg swelling Not much walking--no claudication at this point (told to limit activity for now) Sleeps flat--no PND lately  Continues on pantoprazole--but only prn No dysphagia  No regular cough No wheezing  Current Outpatient Medications on File Prior to Visit  Medication Sig Dispense Refill  . aspirin 81 MG chewable tablet Chew 1 tablet (81 mg total) by mouth daily.    [name] atorvastatin (LIPITOR) 80 MG tablet TAKE ONE (1) TABLET BY MOUTH EACH EVENING AT 6PM (Patient taking differently: Take 80 mg by mouth every evening. TAKE ONE (1) TABLET BY MOUTH EACH EVENING AT 6PM) 90 tablet 1  . clonazePAM (KLONOPIN) 0.5 MG tablet TAKE 1 TABLET BY MOUTH EVERY DAY AT BEDTIME AS NEEDED (Patient taking differently: Take 0.5 mg by mouth at bedtime as needed.) 30 tablet 0  . clopidogrel (PLAVIX) 75 MG tablet TAKE ONE TABLET BY MOUTH EACH MORNING WITH BREAKFAST (Patient taking differently: Take 75 mg by mouth daily. TAKE ONE TABLET BY MOUTH EACH MORNING WITH BREAKFAST) 90 tablet 1   . isosorbide mononitrate (IMDUR) 30 MG 24 hr tablet Take 1 tablet (30 mg total) by mouth daily. 30 tablet 0  . lisinopril (ZESTRIL) 5 MG tablet TAKE 1/2 TABLET BY MOUTH EVERY DAY (Patient taking differently: Take 2.5 mg by mouth daily.) 45 tablet 3  . Omega-3 Fatty Acids (FISH OIL) 1000 MG CAPS Take 1,000 mg by mouth in the morning and at bedtime.    . pantoprazole (PROTONIX) 20 MG tablet TAKE 1 TABLET BY MOUTH EVERY DAY AS NEEDED (Patient taking differently: Take by mouth daily as needed for heartburn.) 90 tablet 3   No current facility-administered medications on file prior to visit.    Allergies  Allergen Reactions  . Codeine Nausea And Vomiting    Past Medical History:  Diagnosis Date  . Anemia   . Atrial fibrillation (HCC) 01/13/2012  . CAD (coronary artery disease)    s/p CABG 2001  . COPD (chronic obstructive pulmonary disease) (HCC)    ongoing tobacco use  . GERD (gastroesophageal reflux disease)   . Ischemic cardiomyopathy 01/10/2012   LVEF 35-40%, mild conc hypertrophy, servere HK of anteroseptum, borderline RVH  . NSTEMI (non-ST elevated myocardial infarction) (HCC) 01/10/2012   Associated with ventricular fibrillation    Past Surgical History:  Procedure Laterality Date  . CARDIAC CATHETERIZATION  2011   At [facility]  . CARDIAC CATHETERIZATION  12/2011   prox LAD occlusion, ostial RCA occlusion, 30% prox LCx stenosis, LIMA-LAD: 80% post-anastamosis lesion, SVG-Dx2: old occlusion w/ thrombus (noted on prior 2011 cath at [facility]), SVG-Dx: patent, SVG-RCA: patent, diffuse irregs, 80-90%  PDA/PLA lesions patent  . CARDIAC CATHETERIZATION N/A 03/15/2015   Procedure: Left Heart Cath and Cors/Grafts Angiography;  Surgeon: Kathleene Hazel, MD;  Location: [facility] INVAS[facility] CV LAB;  Service: Cardiovascular;  Laterality: N/A;  . CARDIAC CATHETERIZATION N/A 03/15/2015   Procedure: Coronary Stent Intervention;  Surgeon: Kathleene Hazel, MD;  Location: [facility] INVASIVE CV LAB;   Service: Cardiovascular;  Laterality: N/A;  svg to diagnoal 2  . CORONARY ARTERY BYPASS GRAFT  2001  . CORONARY STENT INTERVENTION N/A 07/10/2020   Procedure: CORONARY STENT INTERVENTION;  Surgeon: Corky Crafts, MD;  Location: [facility] INVASIVE CV LAB;  Service: Cardiovascular;  Laterality: N/A;  . CORONARY STENT INTERVENTION N/A 07/26/2020   Procedure: CORONARY STENT INTERVENTION;  Surgeon: Iran Ouch, MD;  Location: [facility] INVASIVE CV LAB;  Service: Cardiovascular;  Laterality: N/A;  . INTRAVASCULAR IMAGING/OCT N/A 07/26/2020   Procedure: INTRAVASCULAR IMAGING/OCT;  Surgeon: Iran Ouch, MD;  Location: [facility] INVASIVE CV LAB;  Service: Cardiovascular;  Laterality: N/A;  . INTRAVASCULAR ULTRASOUND/IVUS N/A 07/10/2020   Procedure: Intravascular Ultrasound/IVUS;  Surgeon: Corky Crafts, MD;  Location: [facility] INVASIVE CV LAB;  Service: Cardiovascular;  Laterality: N/A;  . LEFT HEART CATH AND CORS/GRAFTS ANGIOGRAPHY N/A 07/10/2020   Procedure: LEFT HEART CATH AND CORS/GRAFTS ANGIOGRAPHY;  Surgeon: Corky Crafts, MD;  Location: [facility] INVASIVE CV LAB;  Service: Cardiovascular;  Laterality: N/A;  . LEFT HEART CATHETERIZATION WITH CORONARY ANGIOGRAM N/A 01/10/2012   Procedure: LEFT HEART CATHETERIZATION WITH CORONARY ANGIOGRAM;  Surgeon: Lennette Bihari, MD;  Location: [facility] CATH LAB;  Service: Cardiovascular;  Laterality: N/A;    Family History  Problem Relation Age of Onset  . Hyperlipidemia Mother   . Alcohol abuse Father   . Hyperlipidemia Father   . Heart disease Sister   . Hyperlipidemia Sister   . Heart disease Brother   . Hyperlipidemia Brother   . Hyperlipidemia Sister   . Diabetes Neg Hx   . Hypertension Neg Hx     Social History   Socioeconomic History  . Marital status: Married    Spouse name: Not on file  . Number of children: 4  . Years of education: Not on file  . Highest education level: Not on file  Occupational History  . Occupation: Management --now disabled   . 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 Smoking status: Former Smoker    Packs/day: 1.00    Years: 45.00    Pack years: 45.00    Types: Cigarettes    Quit date: 03/13/2015    Years since quitting: 5.3  . Smokeless tobacco: Never Used  Vaping Use  . Vaping Use: Never used  Substance and Sexual Activity  . Alcohol use: Yes    Alcohol/week: 0.0 standard drinks    Comment: really rare beer  . Drug use: Yes    Types: Marijuana  . Sexual activity: Not Currently  Other Topics Concern  . Not on file  Social History Narrative   Married twice   3 children from 1st marriage-- 1 from second   Worked as Investment banker, operational      Has living will   Wife has health care POA---alternate would be daughter Judeth Cornfield   Would accept resuscitation--- but no prolonged ventilation   No tube feeds if cognitively unaware   Social Determinants of Health   Financial Resource Strain: Not on file  Food Insecurity: Not on file  Transportation Needs: Not  on file  Physical Activity: Not on file  Stress: Not on file  Social Connections: Not on file  Intimate Partner Violence: Not on file   Review of Systems Has had more trouble sleeping since the procedures --using the clonazepam Appetite is good Weight is stable Wears seat belt Dentures--so doesn't need dentist Bowels are fine--no blood Voids well---flow okay, empties fine. Nocturia only once per night No sig back or joint pains    Objective:   Physical Exam Constitutional:      Appearance: Normal appearance.  HENT:     Mouth/Throat:     Comments: No lesions Eyes:     Conjunctiva/sclera: Conjunctivae normal.     Pupils: Pupils are equal, round, and reactive to light.  Cardiovascular:     Rate and Rhythm: Normal rate and regular rhythm.     Heart sounds: No murmur heard. No gallop.      Comments: Feet warm but no pulses Pulmonary:     Effort: Pulmonary effort is normal.      Breath sounds: Normal breath sounds. No wheezing or rales.  Abdominal:     Palpations: Abdomen is soft.     Tenderness: There is no abdominal tenderness.  Musculoskeletal:     Cervical back: Neck supple.     Right lower leg: No edema.     Left lower leg: No edema.  Lymphadenopathy:     Cervical: No cervical adenopathy.  Skin:    General: Skin is warm.     Findings: No rash.  Neurological:     Mental Status: He is alert and oriented to person, place, and time.     Comments: President--"Biden, Trump, Obama" [id number] D-l-r-[id number]-w Recall 2/3  Psychiatric:        Mood and Affect: Mood normal.        Behavior: Behavior normal.            Assessment & Plan:

## 2020-08-02 ENCOUNTER — Telehealth: Payer: Self-pay

## 2020-08-02 NOTE — Telephone Encounter (Signed)
Patient scheduled for lung screening CT scan Wednesday 08/09/20 @ 8:30. Patient states that insurance is the same and that he quit smoking about 3 years ago. He would like a text with address and phone number to the imaging center.

## 2020-08-04 ENCOUNTER — Other Ambulatory Visit: Payer: Self-pay | Admitting: *Deleted

## 2020-08-04 ENCOUNTER — Encounter: Payer: Self-pay | Admitting: Nurse Practitioner

## 2020-08-04 ENCOUNTER — Ambulatory Visit: Payer: PPO | Admitting: Nurse Practitioner

## 2020-08-04 ENCOUNTER — Other Ambulatory Visit: Payer: Self-pay

## 2020-08-04 VITALS — BP 114/62 | HR 58 | Ht 67.25 in | Wt 175.0 lb

## 2020-08-04 DIAGNOSIS — Z87891 Personal history of nicotine dependence: Secondary | ICD-10-CM

## 2020-08-04 DIAGNOSIS — I255 Ischemic cardiomyopathy: Secondary | ICD-10-CM

## 2020-08-04 DIAGNOSIS — I5032 Chronic diastolic (congestive) heart failure: Secondary | ICD-10-CM | POA: Diagnosis not present

## 2020-08-04 DIAGNOSIS — I739 Peripheral vascular disease, unspecified: Secondary | ICD-10-CM | POA: Diagnosis not present

## 2020-08-04 DIAGNOSIS — Z122 Encounter for screening for malignant neoplasm of respiratory organs: Secondary | ICD-10-CM

## 2020-08-04 DIAGNOSIS — I2510 Atherosclerotic heart disease of native coronary artery without angina pectoris: Secondary | ICD-10-CM | POA: Diagnosis not present

## 2020-08-04 DIAGNOSIS — G4730 Sleep apnea, unspecified: Secondary | ICD-10-CM | POA: Diagnosis not present

## 2020-08-04 DIAGNOSIS — E785 Hyperlipidemia, unspecified: Secondary | ICD-10-CM

## 2020-08-04 NOTE — Patient Instructions (Signed)
Medication Instructions:  Your physician recommends that you continue on your current medications as directed. Please refer to the Current Medication list given to you today.  *If you need a refill on your cardiac medications before your next appointment, please call your pharmacy*   Lab Work: None ordered If you have labs (blood work) drawn today and your tests are completely normal, you will receive your results only by: [name] MyChart Message (if you have MyChart) OR . A paper copy in the mail If you have any lab test that is abnormal or we need to change your treatment, we will call you to review the results.   Testing/Procedures: You have been given a WatchPAT at home sleep study device. Please follow the instructions given.   Follow-Up: At [facility], you and your health needs are our priority.  As part of our continuing mission to provide you with exceptional heart care, we have created designated Provider Care Teams.  These Care Teams include your primary Cardiologist (physician) and Advanced Practice Providers (APPs -  Physician Assistants and Nurse Practitioners) who all work together to provide you with the care you need, when you need it.  We recommend signing up for the patient portal called "MyChart".  Sign up information is provided on this After Visit Summary.  MyChart is used to connect with patients for Virtual Visits (Telemedicine).  Patients are able to view lab/test results, encounter notes, upcoming appointments, etc.  Non-urgent messages can be sent to your provider as well.   To learn more about what you can do with MyChart, go to [email or web address].    Your next appointment:   4-6 week(s)  The format for your next appointment:   In Person  Provider:   You may see Lorine Bears, MD or one of the following Advanced Practice Providers on your designated Care Team:    Nicolasa Ducking, NP  Eula Listen, PA-C  Marisue Ivan, PA-C  [place],  [place]  Gillian Shields, NP    Other Instructions N/A

## 2020-08-04 NOTE — Progress Notes (Signed)
Contacted and scheduled for annual lung screening scan. Patient is a former smoker, quit 03/13/15, 45 pack year history.

## 2020-08-04 NOTE — Progress Notes (Signed)
Office Visit    Patient Name: EVELIO RUEDA Date of Encounter: 08/04/2020  Primary Care Provider:  [name], MD Primary Cardiologist:  Lorine Bears, MD  Chief Complaint    70 year old male with a history of CAD status post CABG, peripheral arterial disease, chronic heart failure with improved ejection fraction, ischemic cardiomyopathy, hyperlipidemia, remote tobacco abuse, COPD, GERD, anemia and remote A. fib, presents for follow-up after recent percutaneous interventions.  Past Medical History    Past Medical History:  Diagnosis Date  . Anemia   . Atrial fibrillation (HCC) 01/13/2012  . CAD (coronary artery disease)    a. 2001 s/p CABGx5 (LIMA->LAD, VG->D1, VG->D2, VG->RPAV->RPDA; b. 02/2015 NSTEMI/PCI: VG->D2 95 (3.6x16 Promus DES). VG->D1 100; c. 06/2020 PCI: LM 75, LAD 100ost, D1 70, D2 70, LCX 66m, RCA 100ost, RPAV 100, VG->RPAV->RPDA 80ost (4.5x12 Synergy DES), VG->D1 100, VG->D2 ok, LIMA->LAD ok; d. 07/2020 PCI LM/LCX (2.5x15 Resolute Onyx DES).  . COPD (chronic obstructive pulmonary disease) (HCC)    ongoing tobacco use  . GERD (gastroesophageal reflux disease)   . HFimpEF (heart failure with improved ejection fraction) (HCC)    a. 12/2011 Echo: EF 35-40%; b. 06/2020 Echo: EF 55-60%  . Ischemic cardiomyopathy 01/10/2012   a. 12/2011 Echo: EF 35-40%, mild conc LVH,  sev HK of anteroseptum, borderline RVH; b. 06/2020 Echo: EF 55-60%, no rwma, triv MR. Mild Ao sclerosis.  . NSTEMI (non-ST elevated myocardial infarction) (HCC) 01/10/2012   Associated with ventricular fibrillation   Past Surgical History:  Procedure Laterality Date  . CARDIAC CATHETERIZATION  2011   At [facility]  . CARDIAC CATHETERIZATION  12/2011   prox LAD occlusion, ostial RCA occlusion, 30% prox LCx stenosis, LIMA-LAD: 80% post-anastamosis lesion, SVG-Dx2: old occlusion w/ thrombus (noted on prior 2011 cath at [facility]), SVG-Dx: patent, SVG-RCA: patent, diffuse irregs, 80-90% PDA/PLA lesions patent   . CARDIAC CATHETERIZATION N/A 03/15/2015   Procedure: Left Heart Cath and Cors/Grafts Angiography;  Surgeon: Kathleene Hazel, MD;  Location: [facility] INVASIVE CV LAB;  Service: Cardiovascular;  Laterality: N/A;  . CARDIAC CATHETERIZATION N/A 03/15/2015   Procedure: Coronary Stent Intervention;  Surgeon: Kathleene Hazel, MD;  Location: [facility] INVASIVE CV LAB;  Service: Cardiovascular;  Laterality: N/A;  svg to diagnoal 2  . CORONARY ARTERY BYPASS GRAFT  2001  . CORONARY STENT INTERVENTION N/A 07/10/2020   Procedure: CORONARY STENT INTERVENTION;  Surgeon: Corky Crafts, MD;  Location: [facility] INVASIVE CV LAB;  Service: Cardiovascular;  Laterality: N/A;  . CORONARY STENT INTERVENTION N/A 07/26/2020   Procedure: CORONARY STENT INTERVENTION;  Surgeon: Iran Ouch, MD;  Location: [facility] INVASIVE CV LAB;  Service: Cardiovascular;  Laterality: N/A;  . INTRAVASCULAR IMAGING/OCT N/A 07/26/2020   Procedure: INTRAVASCULAR IMAGING/OCT;  Surgeon: Iran Ouch, MD;  Location: [facility] INVASIVE CV LAB;  Service: Cardiovascular;  Laterality: N/A;  . INTRAVASCULAR ULTRASOUND/IVUS N/A 07/10/2020   Procedure: Intravascular Ultrasound/IVUS;  Surgeon: Corky Crafts, MD;  Location: [facility] INVASIVE CV LAB;  Service: Cardiovascular;  Laterality: N/A;  . LEFT HEART CATH AND CORS/GRAFTS ANGIOGRAPHY N/A 07/10/2020   Procedure: LEFT HEART CATH AND CORS/GRAFTS ANGIOGRAPHY;  Surgeon: Corky Crafts, MD;  Location: [facility] INVASIVE CV LAB;  Service: Cardiovascular;  Laterality: N/A;  . LEFT HEART CATHETERIZATION WITH CORONARY ANGIOGRAM N/A 01/10/2012   Procedure: LEFT HEART CATHETERIZATION WITH CORONARY ANGIOGRAM;  Surgeon: Lennette Bihari, MD;  Location: [facility] CATH LAB;  Service: Cardiovascular;  Laterality: N/A;    Allergies  Allergies  Allergen Reactions  .  Codeine Nausea And Vomiting    History of Present Illness    70 year old male with above complex past medical history including coronary artery disease,  peripheral arterial disease with a known chronic total occlusion of bilateral SFAs with distal reconstitution via collaterals, chronic heart failure with improved ejection fraction, ischemic cardiomyopathy, tobacco abuse, COPD, GERD, anemia, and remote atrial fibrillation.  Cardiac history dates back to 2001, when he suffered a cardiac arrest and required coronary artery bypass grafting x5.  He subsequently suffered a non-STEMI in December 2016 underwent PCI undergoing stent placement to the vein graft to the second diagonal.  EF was 35 to 45% at that time.  In July 2021, he had worsening dyspnea and weight gain.  Carvedilol was discontinued secondary to bradycardia.  Echo in August 2021 showed an EF of 50-55%.  He was doing well at February 2022 office visit however, in late April 2022, he was [facility] with chest pain and normal troponins.  He underwent diagnostic catheterization revealing severe disease in the ostial vein graft to the RPAV and RPDA.  He also had severe left main disease extending into the native left circumflex, which was not previously grafted.  He underwent PCI and drug-eluting stent placement to the vein graft to the RPAV/RPDA with plan for staged intervention of the native circumflex.  During that admission, he was noted to have intermittent, asymptomatic Mobitz 1.  He presented back on May 11 and underwent successful PCI and drug-eluting stent placement to the distal left main/ostial left circumflex.  Post procedure, he was noted to have bradycardia with rates in the 30s during periods of sleep and snoring.  Recommendation was made for outpatient sleep study.  Since his procedures, he has felt well.  He played golf yesterday without any symptoms or limitations.  He denies chest pain, dyspnea, palpitations, PND, orthopnea, dizziness, syncope, edema, or early satiety.  He does admit to loud snoring and frequently wakes up gasping for air, especially when lying on his back.  He  says he feels certain that he has sleep apnea and is interested in being tested.  Home Medications    Prior to Admission medications   Medication Sig Start Date End Date Taking? Authorizing Provider  aspirin 81 MG chewable tablet Chew 1 tablet (81 mg total) by mouth daily. 07/26/20   Arty Baumgartneroberts, Lindsay B, NP  atorvastatin (LIPITOR) 80 MG tablet TAKE ONE (1) TABLET BY MOUTH EACH EVENING AT 6PM Patient taking differently: Take 80 mg by mouth every evening. TAKE ONE (1) TABLET BY MOUTH EACH EVENING AT 6PM 05/09/20   Iran OuchArida, Muhammad A, MD  clonazePAM (KLONOPIN) 0.5 MG tablet TAKE 1 TABLET BY MOUTH EVERY DAY AT BEDTIME AS NEEDED 08/01/20   Karie SchwalbeLetvak, Richard I, MD  clopidogrel (PLAVIX) 75 MG tablet TAKE ONE TABLET BY MOUTH EACH MORNING WITH BREAKFAST Patient taking differently: Take 75 mg by mouth daily. TAKE ONE TABLET BY MOUTH EACH MORNING WITH BREAKFAST 05/09/20   Iran OuchArida, Muhammad A, MD  isosorbide mononitrate (IMDUR) 30 MG 24 hr tablet Take 1 tablet (30 mg total) by mouth daily. 07/12/20   Joseph ArtVann, Jessica U, DO  lisinopril (ZESTRIL) 5 MG tablet TAKE 1/2 TABLET BY MOUTH EVERY DAY Patient taking differently: Take 2.5 mg by mouth daily. 01/01/20   Karie SchwalbeLetvak, Richard I, MD  Omega-3 Fatty Acids (FISH OIL) 1000 MG CAPS Take 1,000 mg by mouth in the morning and at bedtime.    [provider]  pantoprazole (PROTONIX) 20 MG tablet TAKE  1 TABLET BY MOUTH EVERY DAY AS NEEDED Patient taking differently: Take by mouth daily as needed for heartburn. 08/17/19   [name], MD    Review of Systems    He denies chest pain, palpitations, dyspnea, pnd, orthopnea, n, v, dizziness, syncope, edema, weight gain, or early satiety.  Snores often wakes up at night gasping-feels certain that he has sleep apnea.  All other systems reviewed and are otherwise negative except as noted above.  Physical Exam    VS:  BP 114/62   Pulse (!) 58   Ht 5' 7.25" (1.708 m)   Wt 175 lb (79.4 kg)   BMI 27.21 kg/m  , BMI Body mass  index is 27.21 kg/m.  Stop bang = 6 GEN: Well nourished, well developed, in no acute distress. HEENT: normal. Neck: Supple, no JVD, carotid bruits, or masses. Cardiac: RRR, no murmurs, rubs, or gallops. No clubbing, cyanosis, edema.  Radials 2+/PT 1+ and equal bilaterally.  Bilateral radial cath site without bleeding, bruit, or hematoma.  Mild resolving ecchymosis noted over the left forearm. Respiratory:  Respirations regular and unlabored, clear to auscultation bilaterally. GI: Soft, nontender, nondistended, BS + x 4. MS: no deformity or atrophy. Skin: warm and dry, no rash. Neuro:  Strength and sensation are intact. Psych: Normal affect.  Accessory Clinical Findings    ECG personally reviewed by me today -sinus bradycardia with sinus arrhythmia, 58, first-degree AV block, right bundle branch block- no acute changes.  Lab Results  Component Value Date   WBC 7.0 07/11/2020   HGB 11.8 (L) 07/11/2020   HCT 34.7 (L) 07/11/2020   MCV 90.4 07/11/2020   PLT 173 07/11/2020   Lab Results  Component Value Date   CREATININE 0.90 07/11/2020   BUN 11 07/11/2020   NA 137 07/11/2020   K 3.5 07/11/2020   CL 106 07/11/2020   CO2 23 07/11/2020   Lab Results  Component Value Date   ALT 14 07/27/2019   AST 13 07/27/2019   ALKPHOS 119 (H) 07/27/2019   BILITOT 0.5 07/27/2019   Lab Results  Component Value Date   CHOL 90 07/08/2020   HDL 26 (L) 07/08/2020   LDLCALC 42 07/08/2020   LDLDIRECT 55.0 07/14/2017   TRIG 108 07/08/2020   CHOLHDL 3.5 07/08/2020    Lab Results  Component Value Date   HGBA1C 5.9 (H) 07/08/2020    Assessment & Plan    1.  Coronary artery disease: Status post recent admission in April with unstable angina catheterization revealing severe disease in the ostial vein graft to the RPAV and RPDA, as well as severe distal left main/ostial left circumflex disease.  The LIMA to the LAD was patent, as well as the previously placed stent in the vein graft to the second  diagonal.  The graft to the RPAV  RPDA was intervened upon with staged PCI of the left main/ostial left circumflex earlier this month.  He has been doing well since both procedures and has not had any chest pain or dyspnea.  Bilateral wrist sites are healing well.  He remains on aspirin, statin, Plavix, nitrate, and ACE inhibitor therapy.  No beta-blocker in the setting of bradycardia.  2.  Ischemic cardiomyopathy/heart failure with improved ejection fraction: EF 55 to 60% by echo in April 2022.  He is euvolemic on examination.  Stable blood pressure and heart rate.  He is on an ACE inhibitor.  No beta-blocker in the setting of baseline bradycardia.  3.  Hyperlipidemia: He  remains on statin therapy with an LDL of 42 earlier this year.  LFTs were normal on May 11.  4.  Peripheral arterial disease: Denies claudication.  Remains on aspirin, Plavix, and statin therapy.  5.  Sinus bradycardia/Mobitz 1: Bradycardic at baseline in the absence of AV nodal blocking agent.  He has a first-degree AV block and right bundle branch block.  He was noted to have Mobitz 1 during hospitalization, especially during periods of sleep.  He is interested in sleep study and we will arrange for today.  6.  Probable sleep apnea/sleep disordered breathing/snoring: He admits to frequent snoring and if he lies on his back, he will often awake gasping.  In light of this and above, we will arrange for at home sleep study.  STOP-BANG = 6.  7.  Disposition: Follow-up sleep study.  Follow-up in clinic in approximately 4 to 6 weeks or sooner if necessary.  Nicolasa Ducking, NP 08/04/2020, 9:21 AM

## 2020-08-08 ENCOUNTER — Telehealth: Payer: Self-pay | Admitting: *Deleted

## 2020-08-08 ENCOUNTER — Telehealth: Payer: Self-pay

## 2020-08-08 NOTE — Telephone Encounter (Signed)
Ordered by Ward Givens, NP.

## 2020-08-08 NOTE — Telephone Encounter (Signed)
-----   Message from Patricia Pesa, RN sent at 08/08/2020  3:03 PM EDT ----- Regarding: FW: WatchPAT [place]amar Per Jodelle Green with [employer] PA is not required for CPT 95800 [phone]). ----- Message ----- From: Jarvis Newcomer, RN Sent: 08/04/2020  12:43 PM EDT To: [facility] Subject: Cheron Schaumann                                WatchPAT given to the pt today. Please send for pre-cert.

## 2020-08-08 NOTE — Telephone Encounter (Signed)
Spoke with patient and advised that device was approved and he may proceed with his watchpat device. Provided pin number of [id number]. He verbalized understanding with no further questions at this time.

## 2020-08-08 NOTE — Telephone Encounter (Signed)
No answer at [facility] [employer] precert line.

## 2020-08-08 NOTE — Telephone Encounter (Signed)
Per Jodelle Green with [employer] PA is not required for CPT 95800 [phone])..  Message to [facility] triage pool to notify patient.

## 2020-08-09 ENCOUNTER — Ambulatory Visit
Admission: RE | Admit: 2020-08-09 | Discharge: 2020-08-09 | Disposition: A | Discharge status: Home / Self Care | Payer: PPO | Source: Ambulatory Visit | Attending: Oncology | Admitting: Oncology

## 2020-08-09 ENCOUNTER — Other Ambulatory Visit: Payer: Self-pay

## 2020-08-09 DIAGNOSIS — Z87891 Personal history of nicotine dependence: Secondary | ICD-10-CM | POA: Diagnosis not present

## 2020-08-09 DIAGNOSIS — Z122 Encounter for screening for malignant neoplasm of respiratory organs: Secondary | ICD-10-CM | POA: Insufficient documentation

## 2020-08-10 ENCOUNTER — Encounter (INDEPENDENT_AMBULATORY_CARE_PROVIDER_SITE_OTHER): Payer: PPO | Admitting: Nurse Practitioner

## 2020-08-10 DIAGNOSIS — G4733 Obstructive sleep apnea (adult) (pediatric): Secondary | ICD-10-CM | POA: Diagnosis not present

## 2020-08-11 ENCOUNTER — Other Ambulatory Visit (INDEPENDENT_AMBULATORY_CARE_PROVIDER_SITE_OTHER): Payer: PPO

## 2020-08-11 DIAGNOSIS — Z1211 Encounter for screening for malignant neoplasm of colon: Secondary | ICD-10-CM | POA: Diagnosis not present

## 2020-08-11 LAB — FECAL OCCULT BLOOD, IMMUNOCHEMICAL: Fecal Occult Bld: NEGATIVE

## 2020-08-15 ENCOUNTER — Other Ambulatory Visit (HOSPITAL_COMMUNITY): Payer: Self-pay

## 2020-08-16 ENCOUNTER — Encounter: Payer: Self-pay | Admitting: *Deleted

## 2020-08-17 ENCOUNTER — Ambulatory Visit: Payer: PPO

## 2020-08-17 ENCOUNTER — Telehealth: Payer: Self-pay | Admitting: *Deleted

## 2020-08-17 DIAGNOSIS — G4730 Sleep apnea, unspecified: Secondary | ICD-10-CM

## 2020-08-17 MED ORDER — ISOSORBIDE MONONITRATE ER 30 MG PO TB24: 30.0000 mg | ORAL_TABLET | Freq: Every day | ORAL | 3 refills | Status: DC

## 2020-08-17 NOTE — Procedures (Signed)
   Sleep Study Report  Patient Information Name: Franklin Marks  ID: [id number] Birth Date: 11/22/1950  Age: 70  Gender:Male BMI: 27.7 (W=176 lb, H=5' 7'') Study Date: 08/10/2020 Referring Physician: Nicolasa Ducking, NP  TEST DESCRIPTION: Home sleep apnea testing was completed using the WatchPat, a Type 1 device, utilizing  peripheral arterial tonometry (PAT), chest movement, actigraphy, pulse oximetry, pulse rate, body position and snore.  AHI was calculated with apnea and hypopnea using valid sleep time as the denominator. RDI includes apneas,  hypopneas, and RERAs. The data acquired and the scoring of sleep and all associated events were performed in  accordance with the recommended standards and specifications as outlined in the AASM Manual for the Scoring of  Sleep and Associated Events 2.2.0 (2015).  FINDINGS: 1. Moderate Obstructive Sleep Apnea with AHI 16.5/hr.  2. No Central Sleep Apnea with pAHIc 1.4/hr. 3. Oxygen desaturations as low as 80%. 4. Moderate snoring was present. O2 sats were < 88% for 0.3 min. 5. Total sleep time was 4 hrs and 16 min. 6. 13.1% of total sleep time was spent in REM sleep.  7. Prolonged sleep onset latency at 40 min.  8. Prolonged REM sleep onset latency at 124 min.  9. Total awakenings were 16.   DIAGNOSIS:  Moderate Obstructive Sleep Apnea (G47.33)  RECOMMENDATIONS: 1. Clinical correlation of these findings is necessary. The decision to treat obstructive sleep apnea (OSA) is usually  based on the presence of apnea symptoms or the presence of associated medical conditions such as Hypertension,  Congestive Heart Failure, Atrial Fibrillation or Obesity. The most common symptoms of OSA are snoring, gasping for  breath while sleeping, daytime sleepiness and fatigue.   2. Initiating apnea therapy is recommended given the presence of symptoms and/or associated conditions.  Recommend proceeding with one of the following:   a. Auto-CPAP therapy  with a pressure range of 5-20cm H2O.   b. An oral appliance (OA) that can be obtained from certain dentists with expertise in sleep medicine. These are  primarily of use in non-obese patients with mild and moderate disease.   c. An ENT consultation which may be useful to look for specific causes of obstruction and possible treatment  Options.   d. If patient is intolerant to PAP therapy, consider referral to ENT for evaluation for hypoglossal nerve stimulator.   3. Close follow-up is necessary to ensure success with CPAP or oral appliance therapy for maximum benefit .  4. A follow-up oximetry study on CPAP is recommended to assess the adequacy of therapy and determine the need  for supplemental oxygen or the potential need for Bi-level therapy. An arterial blood gas to determine the adequacy of  baseline ventilation and oxygenation should also be considered.  5. Healthy sleep recommendations include: adequate nightly sleep (normal 7-9 hrs/night), avoidance of caffeine after  noon and alcohol near bedtime, and maintaining a sleep environment that is cool, dark and quiet.  6. Weight loss for overweight patients is recommended. Even modest amounts of weight loss can significantly  improve the severity of sleep apnea.  7. Snoring recommendations include: weight loss where appropriate, side sleeping, and avoidance of alcohol before  Bed.  8. Operation of motor vehicle or dangerous equipment must be avoided when feeling drowsy, excessively sleepy, or  mentally fatigued.  Report prepared by: Signature: Armanda Magic, MD; [facility]; Diplomat, American Board of Sleep Medicine  Electronically Signed: Aug 17, 2020

## 2020-08-17 NOTE — Telephone Encounter (Signed)
-----   Message from Creig Hines, NP sent at 08/17/2020  3:37 PM EDT ----- Elita Quick, would you pls let Mr. Siefert know that his sleep study was abnormal and suggestive of moderate sleep apnea.  Would you please arrange for f/u with Dr. Mayford Knife in [place] to discuss next steps for treatment?

## 2020-08-17 NOTE — Telephone Encounter (Signed)
Reviewed results and recommendations to follow up with Dr. Mayford Knife in [place] to review next steps for treatment based on his WatchPat results. He was agreeable with this plan and advised that I would send over to them to assist with scheduling and to please call back if he has not heard anything in a couple of weeks. He verbalized understanding with no further questions at this time.

## 2020-08-29 ENCOUNTER — Telehealth: Payer: Self-pay | Admitting: *Deleted

## 2020-08-29 DIAGNOSIS — G4733 Obstructive sleep apnea (adult) (pediatric): Secondary | ICD-10-CM

## 2020-08-29 NOTE — Telephone Encounter (Signed)
The patient has been notified of the result and verbalized understanding.  All questions (if any) were answered. Franklin Marks, CMA 08/29/2020 6:07 PM    Upon patient request DME selection is [facility] Patient understands he will be contacted by [facility] to set up his cpap. Patient understands to call if [facility] does not contact him with new setup in a timely manner. Patient understands they will be called once confirmation has been received from [facility]  that they have received their new machine to schedule 10 week follow up appointment.   [facility] notified of new cpap order  Please add to airview Patient was grateful for the call and thanked me

## 2020-08-29 NOTE — Telephone Encounter (Signed)
-----   Message from Quintella Reichert, MD sent at 08/17/2020 12:47 PM EDT ----- Please let patient know that they have sleep apnea and recommend treating with CPAP.  Please order an auto CPAP from 4-15cm H2O with heated humidity and mask of choice.  Order overnight pulse ox on CPAP.  Followup with me in 6 weeks.

## 2020-08-30 ENCOUNTER — Other Ambulatory Visit: Payer: Self-pay | Admitting: Internal Medicine

## 2020-09-19 ENCOUNTER — Other Ambulatory Visit: Payer: Self-pay | Admitting: Internal Medicine

## 2020-09-19 ENCOUNTER — Ambulatory Visit: Payer: PPO | Admitting: Medical

## 2020-09-19 ENCOUNTER — Other Ambulatory Visit: Payer: Self-pay | Admitting: Cardiovascular Disease

## 2020-09-19 ENCOUNTER — Encounter: Payer: Self-pay | Admitting: Medical

## 2020-09-19 ENCOUNTER — Other Ambulatory Visit: Payer: Self-pay

## 2020-09-19 VITALS — BP 130/70 | HR 56 | Ht 68.5 in | Wt 176.0 lb

## 2020-09-19 DIAGNOSIS — I10 Essential (primary) hypertension: Secondary | ICD-10-CM | POA: Diagnosis not present

## 2020-09-19 DIAGNOSIS — I2510 Atherosclerotic heart disease of native coronary artery without angina pectoris: Secondary | ICD-10-CM | POA: Diagnosis not present

## 2020-09-19 DIAGNOSIS — R001 Bradycardia, unspecified: Secondary | ICD-10-CM

## 2020-09-19 DIAGNOSIS — I739 Peripheral vascular disease, unspecified: Secondary | ICD-10-CM

## 2020-09-19 DIAGNOSIS — I255 Ischemic cardiomyopathy: Secondary | ICD-10-CM

## 2020-09-19 DIAGNOSIS — I5032 Chronic diastolic (congestive) heart failure: Secondary | ICD-10-CM

## 2020-09-19 DIAGNOSIS — I441 Atrioventricular block, second degree: Secondary | ICD-10-CM | POA: Diagnosis not present

## 2020-09-19 DIAGNOSIS — E785 Hyperlipidemia, unspecified: Secondary | ICD-10-CM

## 2020-09-19 DIAGNOSIS — G4733 Obstructive sleep apnea (adult) (pediatric): Secondary | ICD-10-CM

## 2020-09-19 NOTE — Progress Notes (Signed)
Cardiology Office Note:    Date:  09/19/2020   ID:  EATHON VALADE, DOB [birth year], MRN [id number]  PCP:  [name], MD  [facility] Cardiologist:  Lorine Bears, MD  [facility] Electrophysiologist:  None   Referring MD: [name], MD   Chief Complaint: 2 month follow-up.   History of Present Illness:    Franklin Marks is a 70 y.o. male with a hx of CAD status post CABG with subsequent stenting, peripheral arterial disease with a known chronic total occlusion of bilateral SFAs with distal reconstitution via collaterals, chronic heart failure with improved EF, ischemic cardiomyopathy, hyperlipidemia, remote tobacco abuse, COPD, GERD, anemia and remote A. fib, presents for follow-up.  History of CAD dating back to 2001, when he suffered cardiac arrest and required CABG x5.  He subsequently suffered a non-STEMI in December 2016 and underwent PCI undergoing stent placement to the vein graft to the second diagonal.  EF was 35 to 40% at that time.  In July 2021, he had worsening dyspnea and weight gain.  Coreg was discontinued secondary to bradycardia.  Echo in August 2021 showed an EF of 50 to 55%.  He was doing well in February 2022 office visit however, in late April 2022, he was admitted to [facility] with chest pain and normal troponins.  He underwent diagnostic cath showing severe disease in the ostial vein graft to the RPA V and RPDA.  He also had severe left main disease extending into the native left circumflex, which was not previously grafted.  He underwent PCI with DES stent placement to the vein graft to the RPAV/RPDA with plan for staged intervention of the native circumflex.  During admission he was noted to have intermittent asymptomatic Mobitz type I.  He presented back on May 11 and underwent successful PCI with DES placement to the distal left main/ostial left circumflex.  Post procedure, he was noted to have bradycardia with rates in the 30s during periods of  sleep and snoring.  Recommendations for outpatient sleep study.  Patient was last seen 08/04/2020 and doing well from a cardiac perspective.  He underwent home sleep study, which showed moderate OSA. CPAP was recommended.  Today, the patient had some questions regarding catheterization, this was reviewed in detail. Patient is still waiting for sleep apnea machine, waiting to hear from insurance for coverage. He is overall doing well from a cardiac perspective he is doing well. No chest pain, bleeding issues, lower leg edema, orthopnea, pnd, or palpitations. He has COPD and sometimes has shortness of breath on exertion, also feels this is worse with the heat. Lately he has been walking 1/4 mile daily due to heat. HE will try and walk more when the weather cools down. BP good. EKG shows known SB with Mobitz Type 1. Will arrange apt with Dr. Mayford Knife for OSA.  Past Medical History:  Diagnosis Date   Anemia    Atrial fibrillation (HCC) 01/13/2012   CAD (coronary artery disease)    a. 2001 s/p CABGx5 (LIMA->LAD, VG->D1, VG->D2, VG->RPAV->RPDA; b. 02/2015 NSTEMI/PCI: VG->D2 95 (3.6x16 Promus DES). VG->D1 100; c. 06/2020 PCI: LM 75, LAD 100ost, D1 70, D2 70, LCX 62m, RCA 100ost, RPAV 100, VG->RPAV->RPDA 80ost (4.5x12 Synergy DES), VG->D1 100, VG->D2 ok, LIMA->LAD ok; d. 07/2020 PCI LM/LCX (2.5x15 Resolute Onyx DES).   COPD (chronic obstructive pulmonary disease) (HCC)    ongoing tobacco use   GERD (gastroesophageal reflux disease)    HFimpEF (heart failure with improved ejection  fraction) (HCC)    a. 12/2011 Echo: EF 35-40%; b. 06/2020 Echo: EF 55-60%   Ischemic cardiomyopathy 01/10/2012   a. 12/2011 Echo: EF 35-40%, mild conc LVH,  sev HK of anteroseptum, borderline RVH; b. 06/2020 Echo: EF 55-60%, no rwma, triv MR. Mild Ao sclerosis.   NSTEMI (non-ST elevated myocardial infarction) (HCC) 01/10/2012   Associated with ventricular fibrillation    Past Surgical History:  Procedure Laterality Date   CARDIAC  CATHETERIZATION  2011   [facility]   CARDIAC CATHETERIZATION  12/2011   prox LAD occlusion, ostial RCA occlusion, 30% prox LCx stenosis, LIMA-LAD: 80% post-anastamosis lesion, SVG-Dx2: old occlusion w/ thrombus (noted on prior 2011 cath at [facility]), SVG-Dx: patent, SVG-RCA: patent, diffuse irregs, 80-90% PDA/PLA lesions patent   CARDIAC CATHETERIZATION N/A 03/15/2015   Procedure: Left Heart Cath and Cors/Grafts Angiography;  Surgeon: Kathleene Hazel, MD;  Location: [facility] INVASIVE CV LAB;  Service: Cardiovascular;  Laterality: N/A;   CARDIAC CATHETERIZATION N/A 03/15/2015   Procedure: Coronary Stent Intervention;  Surgeon: Kathleene Hazel, MD;  Location: [facility] INVASIVE CV LAB;  Service: Cardiovascular;  Laterality: N/A;  svg to diagnoal 2   CORONARY ARTERY BYPASS GRAFT  2001   CORONARY STENT INTERVENTION N/A 07/10/2020   Procedure: CORONARY STENT INTERVENTION;  Surgeon: Corky Crafts, MD;  Location: [facility] INVASIVE CV LAB;  Service: Cardiovascular;  Laterality: N/A;   CORONARY STENT INTERVENTION N/A 07/26/2020   Procedure: CORONARY STENT INTERVENTION;  Surgeon: Iran Ouch, MD;  Location: [facility] INVASIVE CV LAB;  Service: Cardiovascular;  Laterality: N/A;   INTRAVASCULAR IMAGING/OCT N/A 07/26/2020   Procedure: INTRAVASCULAR IMAGING/OCT;  Surgeon: Iran Ouch, MD;  Location: [facility] INVASIVE CV LAB;  Service: Cardiovascular;  Laterality: N/A;   INTRAVASCULAR ULTRASOUND/IVUS N/A 07/10/2020   Procedure: Intravascular Ultrasound/IVUS;  Surgeon: Corky Crafts, MD;  Location: [facility] INVASIVE CV LAB;  Service: Cardiovascular;  Laterality: N/A;   LEFT HEART CATH AND CORS/GRAFTS ANGIOGRAPHY N/A 07/10/2020   Procedure: LEFT HEART CATH AND CORS/GRAFTS ANGIOGRAPHY;  Surgeon: Corky Crafts, MD;  Location: [facility] INVASIVE CV LAB;  Service: Cardiovascular;  Laterality: N/A;   LEFT HEART CATHETERIZATION WITH CORONARY ANGIOGRAM N/A 01/10/2012   Procedure: LEFT HEART CATHETERIZATION WITH CORONARY  ANGIOGRAM;  Surgeon: Lennette Bihari, MD;  Location: [facility] CATH LAB;  Service: Cardiovascular;  Laterality: N/A;    Current Medications: Current Meds  Medication Sig   aspirin 81 MG chewable tablet Chew 1 tablet (81 mg total) by mouth daily.   atorvastatin (LIPITOR) 80 MG tablet Take 80 mg by mouth every evening.   clonazePAM (KLONOPIN) 0.5 MG tablet TAKE 1 TABLET BY MOUTH EVERY DAY AT BEDTIME AS NEEDED   clopidogrel (PLAVIX) 75 MG tablet Take 75 mg by mouth daily.   isosorbide mononitrate (IMDUR) 30 MG 24 hr tablet Take 1 tablet (30 mg total) by mouth daily.   lisinopril (ZESTRIL) 5 MG tablet Take 2.5 mg by mouth daily.   Omega-3 Fatty Acids (FISH OIL) 1000 MG CAPS Take 1,000 mg by mouth in the morning and at bedtime.   pantoprazole (PROTONIX) 20 MG tablet TAKE 1 TABLET BY MOUTH EVERY DAY AS NEEDED     Allergies:   Codeine   Social History   Socioeconomic History   Marital status: Married    Spouse name: Not on file   Number of children: 4   Years of education: Not on file   Highest education level: Not on file  Occupational History   Occupation: Management --now disabled  Occupation: Estate manager/land agent work    Comment: part time--now retired from this also  Tobacco Use   Smoking status: Former    Packs/day: 1.00    Years: 45.00    Pack years: 45.00    Types: Cigarettes    Quit date: 03/13/2015    Years since quitting: 5.5   Smokeless tobacco: Never  Vaping Use   Vaping Use: Never used  Substance and Sexual Activity   Alcohol use: Not Currently    Comment: really rare beer   Drug use: Yes    Types: Marijuana   Sexual activity: Not Currently  Other Topics Concern   Not on file  Social History Narrative   Married twice   3 children from 1st marriage-- 1 from second   Worked as Investment banker, operational      Has living will   Wife has health care POA---alternate would be daughter Judeth Cornfield   Would accept resuscitation--- but no prolonged ventilation   No tube feeds if  cognitively unaware   Social Determinants of Health   Financial Resource Strain: Not on file  Food Insecurity: Not on file  Transportation Needs: Not on file  Physical Activity: Not on file  Stress: Not on file  Social Connections: Not on file     Family History: The patient's family history includes Alcohol abuse in his father; Heart disease in his brother and sister; Hyperlipidemia in his brother, father, mother, sister, and sister. There is no history of Diabetes or Hypertension.  ROS:   Please see the history of present illness.     All other systems reviewed and are negative.  EKGs/Labs/Other Studies Reviewed:    The following studies were reviewed today:  Cardiac cath 07/26/20  Ost LAD lesion is 100% stenosed. Dist LAD-1 lesion is 70% stenosed. Dist LAD-2 lesion is 70% stenosed. Mid Cx lesion is 30% stenosed. Ost Cx to Prox Cx lesion is 85% stenosed. Post intervention, there is a 0% residual stenosis. A drug-eluting stent was successfully placed using a STENT RESOLUTE ONYX 2.5X15.   Successful OCT guided angioplasty and drug-eluting stent placement to the ostial left circumflex/distal left main.   Recommendations: Continue dual antiplatelet therapy for at least 6 months.  Aggressive treatment of risk factors. The patient is a candidate for same-day discharge.   LHC 07/10/20  Ost LAD lesion is 100% stenosed. LIMA to LAD is patent. Dist LAD-1 lesion is 70% stenosed. SVG to first diagonal is occluded. Mid Cx lesion is 30% stenosed. Ost RCA lesion is 100% stenosed. SVG to PDA is patent with 80% proximal stenosis. A drug-eluting stent was successfully placed using a SYNERGY XD 4.50X12, postdilated to > 5 mm and optimized with IVUS. Post intervention, there is a 0% residual stenosis. RPAV lesion is occluded with left to right collaterals. Patent stent in SVG to second diagonal. Mid LM to Ost LAD lesion is 75% stenosed. This has progressed compared to the prior  cath. The left ventricular ejection fraction is 45-50% by visual estimate. There is mild left ventricular systolic dysfunction. Anterolateral hypokinesis. LV end diastolic pressure is normal. There is no aortic valve stenosis. Origin to Prox Graft lesion before RPAV is 80% stenosed. And is normal in caliber. The graft exhibits mild .   Continue aggressive secondary prevention.  Consider PCI of left main into circumflex tomorrow.  Could use right radial approach.  Echo 07/08/20 1. Left ventricular ejection fraction, by estimation, is 55 to 60%. The  left ventricle has normal function. The left ventricle  has no regional  wall motion abnormalities. Left ventricular diastolic parameters were  normal.   2. Right ventricular systolic function is normal. The right ventricular  size is normal. Tricuspid regurgitation signal is inadequate for assessing  PA pressure.   3. The mitral valve is normal in structure. Trivial mitral valve  regurgitation. No evidence of mitral stenosis.   4. The aortic valve is tricuspid. Aortic valve regurgitation is not  visualized. Mild aortic valve sclerosis is present, with no evidence of  aortic valve stenosis.   5. The inferior vena cava is normal in size with <50% respiratory  variability, suggesting right atrial pressure of 8 mmHg.    EKG:  EKG is ordered today.  The ekg ordered today demonstrates SB, 56bpm, PRI , RBBB, no significant changes  Recent Labs: 07/11/2020: BUN 11; Creatinine, Ser 0.90; Hemoglobin 11.8; Platelets 173; Potassium 3.5; Sodium 137  Recent Lipid Panel    Component Value Date/Time   CHOL 90 07/08/2020 0124   TRIG 108 07/08/2020 0124   HDL 26 (L) 07/08/2020 0124   CHOLHDL 3.5 07/08/2020 0124   VLDL 22 07/08/2020 0124   LDLCALC 42 07/08/2020 0124   LDLDIRECT 55.0 07/14/2017 1552    Physical Exam:    VS:  BP 130/70 (BP Location: Left Arm, Patient Position: Sitting, Cuff Size: Normal)   Pulse (!) 56   Ht 5' 8.5" (1.74 m)    Wt 176 lb (79.8 kg)   SpO2 97%   BMI 26.37 kg/m     Wt Readings from Last 3 Encounters:  09/19/20 176 lb (79.8 kg)  08/09/20 172 lb (78 kg)  08/04/20 175 lb (79.4 kg)     GEN:  Well nourished, well developed in no acute distress HEENT: Normal NECK: No JVD; No carotid bruits LYMPHATICS: No lymphadenopathy CARDIAC: RR, bradycardia, no murmurs, rubs, gallops RESPIRATORY:  Clear to auscultation without rales, wheezing or rhonchi  ABDOMEN: Soft, non-tender, non-distended MUSCULOSKELETAL:  No edema; No deformity  SKIN: Warm and dry NEUROLOGIC:  Alert and oriented x 3 PSYCHIATRIC:  Normal affect   ASSESSMENT:    1. Atherosclerosis of native coronary artery of native heart without angina pectoris   2. Hyperlipidemia LDL goal <70   3. Chronic heart failure with preserved ejection fraction (HCC)   4. Ischemic cardiomyopathy   5. Obstructive sleep apnea syndrome   6. Mobitz (type) I (Wenckebach's) atrioventricular block   7. Essential hypertension   8. PAD (peripheral artery disease) (HCC)   9. Sinus bradycardia    PLAN:    In order of problems listed above:  CAD s/p CABGx5 with subsequent stenting, most recent 07/2020 Patient is doing well from a cardiac perspective. No chest pain. Has some SOB with exertion, but feels this is worse with heat and COPD. Recommended to monitor symptoms and let us know if this worsens. No bleeding issues with Aspirin and plavix. Continue statin. No BB with SB/Mobitz type 1. Continue Imdur. He already has follow-up with Dr. Kirke Corin scheduled in September 2022.   ICM with improved EF Most recent echo April 2022 showed EF 55-60%.Euvolemic on exam. Continue Lisinopril. No BB as above  HLD Most recent LDL 43. Continue statin.   HTN BP well controlled. Continue lisinopril, Imdur. No BB as above.   PAD Continue Aspirin and Plavix. No claudication symptoms.   Mobitz Type 1/Sinus bradycardia Baseline bradycardia with first degree AV block and RBBB not  on AV nodal blocking agent. During most recent hospitalization Mobitz type 1/SB worse at night  and he was sent for sleep study found to have moderate OSA and recommended CPAP. EKG today with SB 56bpm with RBBB and Mobitz type 1, no significant change.   Moderate sleep apnea Moderate OSA by at home sleep study and recommended CPAP. He is still waiting for machine. I will set him up to see Dr. Mayford Knifeurner.   Disposition: Follow up in 2 month(s) with Dr. Kirke CorinArida    Signed, Ramez Arrona David StallH Yasenia Reedy, PA-C  09/19/2020 9:12 AM    [facility]

## 2020-09-19 NOTE — Telephone Encounter (Signed)
Last filled 08-01-20 #30 Last OV 08-01-20 No Future OV [facility]

## 2020-09-19 NOTE — Patient Instructions (Addendum)
Medication Instructions:  Your physician recommends that you continue on your current medications as directed. Please refer to the Current Medication list given to you today.  *If you need a refill on your cardiac medications before your next appointment, please call your pharmacy*   Lab Work: None ordered  If you have labs (blood work) drawn today and your tests are completely normal, you will receive your results only by: MyChart Message (if you have MyChart) OR A paper copy in the mail If you have any lab test that is abnormal or we need to change your treatment, we will call you to review the results.   Testing/Procedures: None ordered   Follow-Up: At [facility], you and your health needs are our priority.  As part of our continuing mission to provide you with exceptional heart care, we have created designated Provider Care Teams.  These Care Teams include your primary Cardiologist (physician) and Advanced Practice Providers (APPs -  Physician Assistants and Nurse Practitioners) who all work together to provide you with the care you need, when you need it.  We recommend signing up for the patient portal called "MyChart".  Sign up information is provided on this After Visit Summary.  MyChart is used to connect with patients for Virtual Visits (Telemedicine).  Patients are able to view lab/test results, encounter notes, upcoming appointments, etc.  Non-urgent messages can be sent to your provider as well.   To learn more about what you can do with MyChart, go to [email or web address].    Your next appointment:   Someone will contact you regarding your sleep study and a follow-up appointment with Dr. Mayford Knife, who will follow you for your sleep apnea.   2 month(s)  The format for your next appointment:   In Person  Provider:   You may see Lorine Bears, MD or one of the following Advanced Practice Providers on your designated Care Team:   Nicolasa Ducking, NP Eula Listen,  PA-C Marisue Ivan, PA-C Cadence Mayking, [place] Gillian Shields, NP   Other Instructions

## 2020-11-21 ENCOUNTER — Ambulatory Visit: Payer: PPO | Admitting: Cardiovascular Disease

## 2020-11-25 ENCOUNTER — Other Ambulatory Visit: Payer: Self-pay | Admitting: Internal Medicine

## 2020-11-27 NOTE — Telephone Encounter (Signed)
Last filled 09-20-20 #30 Last OV/CPE 08-01-20 No Future OV [facility]

## 2020-12-18 ENCOUNTER — Encounter: Payer: Self-pay | Admitting: Nurse Practitioner

## 2020-12-18 ENCOUNTER — Other Ambulatory Visit: Payer: Self-pay

## 2020-12-18 ENCOUNTER — Ambulatory Visit: Payer: PPO | Admitting: Nurse Practitioner

## 2020-12-18 VITALS — BP 110/70 | HR 54 | Ht 69.0 in | Wt 180.0 lb

## 2020-12-18 DIAGNOSIS — G4733 Obstructive sleep apnea (adult) (pediatric): Secondary | ICD-10-CM | POA: Diagnosis not present

## 2020-12-18 DIAGNOSIS — I2510 Atherosclerotic heart disease of native coronary artery without angina pectoris: Secondary | ICD-10-CM | POA: Diagnosis not present

## 2020-12-18 DIAGNOSIS — R001 Bradycardia, unspecified: Secondary | ICD-10-CM | POA: Diagnosis not present

## 2020-12-18 DIAGNOSIS — I255 Ischemic cardiomyopathy: Secondary | ICD-10-CM

## 2020-12-18 DIAGNOSIS — Z720 Tobacco use: Secondary | ICD-10-CM | POA: Diagnosis not present

## 2020-12-18 DIAGNOSIS — I10 Essential (primary) hypertension: Secondary | ICD-10-CM

## 2020-12-18 DIAGNOSIS — E785 Hyperlipidemia, unspecified: Secondary | ICD-10-CM | POA: Diagnosis not present

## 2020-12-18 NOTE — Patient Instructions (Signed)
Medication Instructions:  ?No changes at this time.  ? ?*If you need a refill on your cardiac medications before your next appointment, please call your pharmacy* ? ? ?Lab Work: ?None ? ?If you have labs (blood work) drawn today and your tests are completely normal, you will receive your results only by: ?MyChart Message (if you have MyChart) OR ?A paper copy in the mail ?If you have any lab test that is abnormal or we need to change your treatment, we will call you to review the results. ? ? ?Testing/Procedures: ?None ? ? ?Follow-Up: ?At [facility], you and your health needs are our priority.  As part of our continuing mission to provide you with exceptional heart care, we have created designated Provider Care Teams.  These Care Teams include your primary Cardiologist (physician) and Advanced Practice Providers (APPs -  Physician Assistants and Nurse Practitioners) who all work together to provide you with the care you need, when you need it. ? ? ?Your next appointment:   ?6 month(s) ? ?The format for your next appointment:   ?In Person ? ?Provider:   ?Muhammad Arida, MD ?

## 2020-12-18 NOTE — Progress Notes (Signed)
Office Visit    Patient Name: Franklin Marks Date of Encounter: 12/18/2020  Primary Care Provider:  [name], MD Primary Cardiologist:  Lorine Bears, MD  Chief Complaint    70 year old male with a history of CAD status post remote CABG and more recent stenting, peripheral arterial disease, chronic heart failure with improved ejection fraction, ischemic cardiomyopathy, hyperlipidemia, remote tobacco abuse, COPD, GERD, anemia, obstructive sleep apnea, and remote atrial fibrillation, presents for follow-up of CAD.  Past Medical History    Past Medical History:  Diagnosis Date   Anemia    Atrial fibrillation (HCC) 01/13/2012   CAD (coronary artery disease)    a. 2001 s/p CABGx5 (LIMA->LAD, VG->D1, VG->D2, VG->RPAV->RPDA; b. 02/2015 NSTEMI/PCI: VG->D2 95 (3.6x16 Promus DES). VG->D1 100; c. 06/2020 PCI: LM 75, LAD 100ost, D1 70, D2 70, LCX 11m, RCA 100ost, RPAV 100, VG->RPAV->RPDA 80ost (4.5x12 Synergy DES), VG->D1 100, VG->D2 ok, LIMA->LAD ok; d. 07/2020 PCI LM/LCX (2.5x15 Resolute Onyx DES).   COPD (chronic obstructive pulmonary disease) (HCC)    ongoing tobacco use   GERD (gastroesophageal reflux disease)    HFimpEF (heart failure with improved ejection fraction) (HCC)    a. 12/2011 Echo: EF 35-40%; b. 06/2020 Echo: EF 55-60%   Ischemic cardiomyopathy 01/10/2012   a. 12/2011 Echo: EF 35-40%, mild conc LVH,  sev HK of anteroseptum, borderline RVH; b. 06/2020 Echo: EF 55-60%, no rwma, triv MR. Mild Ao sclerosis.   NSTEMI (non-ST elevated myocardial infarction) (HCC) 01/10/2012   Associated with ventricular fibrillation   OSA (obstructive sleep apnea)    a. 07/2020 WatchPat1: 1. Moderate Obstructive Sleep Apnea with AHI 16.5/hr. O2 sat down to 80%.   Past Surgical History:  Procedure Laterality Date   CARDIAC CATHETERIZATION  2011   At [facility]   CARDIAC CATHETERIZATION  12/2011   prox LAD occlusion, ostial RCA occlusion, 30% prox LCx stenosis, LIMA-LAD: 80% post-anastamosis  lesion, SVG-Dx2: old occlusion w/ thrombus (noted on prior 2011 cath at [facility]), SVG-Dx: patent, SVG-RCA: patent, diffuse irregs, 80-90% PDA/PLA lesions patent   CARDIAC CATHETERIZATION N/A 03/15/2015   Procedure: Left Heart Cath and Cors/Grafts Angiography;  Surgeon: Kathleene Hazel, MD;  Location: [facility] INVASIVE CV LAB;  Service: Cardiovascular;  Laterality: N/A;   CARDIAC CATHETERIZATION N/A 03/15/2015   Procedure: Coronary Stent Intervention;  Surgeon: Kathleene Hazel, MD;  Location: [facility] INVASIVE CV LAB;  Service: Cardiovascular;  Laterality: N/A;  svg to diagnoal 2   CORONARY ARTERY BYPASS GRAFT  2001   CORONARY STENT INTERVENTION N/A 07/10/2020   Procedure: CORONARY STENT INTERVENTION;  Surgeon: Corky Crafts, MD;  Location: [facility] INVASIVE CV LAB;  Service: Cardiovascular;  Laterality: N/A;   CORONARY STENT INTERVENTION N/A 07/26/2020   Procedure: CORONARY STENT INTERVENTION;  Surgeon: Iran Ouch, MD;  Location: [facility] INVASIVE CV LAB;  Service: Cardiovascular;  Laterality: N/A;   INTRAVASCULAR IMAGING/OCT N/A 07/26/2020   Procedure: INTRAVASCULAR IMAGING/OCT;  Surgeon: Iran Ouch, MD;  Location: [facility] INVASIVE CV LAB;  Service: Cardiovascular;  Laterality: N/A;   INTRAVASCULAR ULTRASOUND/IVUS N/A 07/10/2020   Procedure: Intravascular Ultrasound/IVUS;  Surgeon: Corky Crafts, MD;  Location: [facility] INVASIVE CV LAB;  Service: Cardiovascular;  Laterality: N/A;   LEFT HEART CATH AND CORS/GRAFTS ANGIOGRAPHY N/A 07/10/2020   Procedure: LEFT HEART CATH AND CORS/GRAFTS ANGIOGRAPHY;  Surgeon: Corky Crafts, MD;  Location: [facility] INVASIVE CV LAB;  Service: Cardiovascular;  Laterality: N/A;   LEFT HEART CATHETERIZATION WITH CORONARY ANGIOGRAM N/A 01/10/2012   Procedure: LEFT HEART CATHETERIZATION  WITH CORONARY ANGIOGRAM;  Surgeon: Lennette Bihari, MD;  Location: [facility] CATH LAB;  Service: Cardiovascular;  Laterality: N/A;    Allergies  Allergies  Allergen Reactions   Codeine  Nausea And Vomiting    History of Present Illness    70 year old male with the above past medical history including CAD, peripheral arterial disease with a known chronic total occlusion of bilateral SFAs with distal reconstitution via collaterals, chronic heart failure with improved ejection fraction, ischemic cardiomyopathy, tobacco abuse, COPD, GERD, anemia, and remote atrial fibrillation.  Cardiac history dates back to 2001, when he suffered a cardiac arrest and required coronary artery bypass grafting x5.  He subsequently suffered a non-STEMI in December 2016 and underwent PCI and stenting to the vein graft  D2.  EF was 35 to 45% at that time.  In July 2021, he had worsening dyspnea and weight gain.  Carvedilol was discontinued secondary to bradycardia.  Echo in August 2021 showed an EF of 50 to 55%.  In late April 2022, he was admitted to [facility] with chest pain and normal troponins.  Diagnostic catheterization revealed severe disease in the ostial vein graft  RPAV  RPDA.  He also had severe left main disease extending into the native left circumflex, which was not previously grafted.  He underwent PCI and drug-eluting stent placement to the vein graft  RPAV  RPDA, with subsequent staged PCI and drug-eluting stent placement to the distal left main/ostial left circumflex during the same admission.  Echo showed EF of 55-60%.  Of note, he was found to have Mobitz 1 heart block during that admission as well as bradycardia with rates into the 30s during periods of sleep.  He subsequently underwent outpatient sleep study, which showed moderate obstructive sleep apnea and arrangements were made for CPAP.  Franklin Marks was last seen in cardiology clinic in July, at which time he was doing well.  Since then, his wife has been diagnosed with breast cancer, and he has been having quite a bit of stress.  He notes difficulty sleeping, and his appetite has fallen off a little bit.  She has been tolerating treatment at  [facility] long and he believes her prognosis is good.  He has been a little bit less active.  He has chronic, stable dyspnea on exertion but denies chest pain, palpitations, PND, orthopnea, dizziness, syncope, edema, or early satiety.  Home Medications    Current Outpatient Medications  Medication Sig Dispense Refill   aspirin 81 MG chewable tablet Chew 1 tablet (81 mg total) by mouth daily.     atorvastatin (LIPITOR) 80 MG tablet TAKE ONE (1) TABLET BY MOUTH EACH EVENING AT 6PM 90 tablet 0   clonazePAM (KLONOPIN) 0.5 MG tablet TAKE 1 TABLET BY MOUTH EVERY DAY AT BEDTIME AS NEEDED 30 tablet 0   clopidogrel (PLAVIX) 75 MG tablet Take 75 mg by mouth daily.     isosorbide mononitrate (IMDUR) 30 MG 24 hr tablet Take 1 tablet (30 mg total) by mouth daily. 90 tablet 3   lisinopril (ZESTRIL) 5 MG tablet Take 2.5 mg by mouth daily.     Omega-3 Fatty Acids (FISH OIL) 1000 MG CAPS Take 1,000 mg by mouth in the morning and at bedtime.     pantoprazole (PROTONIX) 20 MG tablet TAKE 1 TABLET BY MOUTH EVERY DAY AS NEEDED 90 tablet 3   No current facility-administered medications for this visit.     Review of Systems    Chronic, stable dyspnea on exertion.  He denies chest pain, palpitations, PND, orthopnea, dizziness, syncope, edema, or early satiety.  All other systems reviewed and are otherwise negative except as noted above.  Physical Exam    VS:  BP 110/70 (BP Location: Left Arm, Patient Position: Sitting, Cuff Size: Normal)   Pulse (!) 54   Ht 5\' 9"  (1.753 m)   Wt 180 lb (81.6 kg)   SpO2 98%   BMI 26.58 kg/m  , BMI Body mass index is 26.58 kg/m.     GEN: Well nourished, well developed, in no acute distress. HEENT: normal. Neck: Supple, no JVD, carotid bruits, or masses. Cardiac: RRR, no murmurs, rubs, or gallops. No clubbing, cyanosis, edema.  Radials 2+/PT 1+ and equal bilaterally.  Respiratory:  Respirations regular and unlabored, clear to auscultation bilaterally. GI: Soft, nontender,  nondistended, BS + x 4. MS: no deformity or atrophy. Skin: warm and dry, no rash. Neuro:  Strength and sensation are intact. Psych: Normal affect.  Accessory Clinical Findings     Lab Results  Component Value Date   WBC 7.0 07/11/2020   HGB 11.8 (L) 07/11/2020   HCT 34.7 (L) 07/11/2020   MCV 90.4 07/11/2020   PLT 173 07/11/2020   Lab Results  Component Value Date   CREATININE 0.90 07/11/2020   BUN 11 07/11/2020   NA 137 07/11/2020   K 3.5 07/11/2020   CL 106 07/11/2020   CO2 23 07/11/2020   Lab Results  Component Value Date   ALT 14 07/27/2019   AST 13 07/27/2019   ALKPHOS 119 (H) 07/27/2019   BILITOT 0.5 07/27/2019   Lab Results  Component Value Date   CHOL 90 07/08/2020   HDL 26 (L) 07/08/2020   LDLCALC 42 07/08/2020   LDLDIRECT 55.0 07/14/2017   TRIG 108 07/08/2020   CHOLHDL 3.5 07/08/2020    Lab Results  Component Value Date   HGBA1C 5.9 (H) 07/08/2020    Assessment & Plan    1.  Coronary artery disease: Status post remote CABG with more recent stenting of the ostial vein graft to the RPAV and RPDA, as well as distal left main/ostial left circumflex in April of this year.  He has been doing well without chest pain.  He has had some dyspnea on exertion, generally at higher levels of activity, which he says is chronic.  He remains on aspirin, statin, Plavix, nitrate, and ACE inhibitor therapy.  No beta-blocker in the setting of bradycardia and Mobitz 1 heart block.  2.  Ischemic cardiomyopathy/heart failure with improved ejection fraction: EF 55 to 60% by echo in April 2022.  He is euvolemic on exam with stable heart rate and blood pressure.  Continue ACE inhibitor therapy.  3.  Hyperlipidemia: Remains on statin therapy with an LDL of 42 in April.    4.  Peripheral arterial disease: Denies claudication.  Remains on aspirin, Plavix, and statin therapy.  5.  Sinus bradycardia/Mobitz 1: Heart rate 54 today.  No history of presyncope or syncope.  Continue to  avoid AV nodal blocking agents.  6.  Obstructive sleep apnea: Moderate sleep apnea based on testing in May of this year.  Still awaiting CPAP equipment.  7.  Tobacco abuse: Smokes occasional cigars.  Notes he has been smoking more in the setting of stress related to his wife's cancer diagnosis.  Cessation encouraged.  8.  Disposition: Follow-up in clinic in 6 months or sooner if necessary.   June, NP 12/18/2020, 11:46 AM

## 2021-01-28 ENCOUNTER — Other Ambulatory Visit: Payer: Self-pay | Admitting: Cardiovascular Disease

## 2021-01-28 ENCOUNTER — Other Ambulatory Visit: Payer: Self-pay | Admitting: Internal Medicine

## 2021-01-29 ENCOUNTER — Other Ambulatory Visit: Payer: Self-pay | Admitting: Internal Medicine

## 2021-01-29 NOTE — Telephone Encounter (Signed)
Last filled 11-27-20 #30 Last OV/CPE 08-01-20 No Future OV  [facility]

## 2021-02-26 ENCOUNTER — Other Ambulatory Visit: Payer: Self-pay | Admitting: Medical

## 2021-03-15 ENCOUNTER — Telehealth: Payer: Self-pay | Admitting: Internal Medicine

## 2021-03-15 NOTE — Chronic Care Management (AMB) (Signed)
°  Chronic Care Management   Outreach Note  03/15/2021 Name: DAINE GUNTHER MRN: [id number] DOB: 1950/04/19  Referred by: [name], MD Reason for referral : No chief complaint on file.   An unsuccessful telephone outreach was attempted today. The patient was referred to the pharmacist for assistance with care management and care coordination.   Follow Up Plan:   Tatjana Dellinger Upstream Scheduler

## 2021-03-22 ENCOUNTER — Telehealth: Payer: Self-pay | Admitting: Internal Medicine

## 2021-03-22 NOTE — Progress Notes (Signed)
°  Chronic Care Management   Outreach Note  03/22/2021 Name: DENI LEFEVER MRN: [id number] DOB: [birth year]  Referred by: [name], MD Reason for referral : No chief complaint on file.   A second unsuccessful telephone outreach was attempted today. The patient was referred to pharmacist for assistance with care management and care coordination.  Follow Up Plan:   Tatjana Dellinger Upstream Scheduler

## 2021-04-08 ENCOUNTER — Other Ambulatory Visit: Payer: Self-pay | Admitting: Internal Medicine

## 2021-04-09 NOTE — Telephone Encounter (Signed)
Last filled 01-29-21 #30 Last OV 08-01-20 CPE No Future OV [facility]

## 2021-04-09 NOTE — Telephone Encounter (Signed)
Have him set up wellness visit June or after

## 2021-06-12 ENCOUNTER — Other Ambulatory Visit: Payer: Self-pay | Admitting: Internal Medicine

## 2021-06-13 NOTE — Telephone Encounter (Signed)
Spoke with patient inform that his rx was sent to the pharmacy and  made him a appt  wellness visit for June. ?

## 2021-06-13 NOTE — Telephone Encounter (Signed)
Rx done ? ?Have him set up wellness visit in June or so ?

## 2021-06-13 NOTE — Telephone Encounter (Signed)
Is this okay to refill ? ?04/09/21  Last filled ?[facility] 08/01/20 ?Next OV -None  ?

## 2021-07-08 ENCOUNTER — Other Ambulatory Visit: Payer: Self-pay | Admitting: Medical

## 2021-07-12 IMAGING — CR DG LUMBAR SPINE 2-3V
1 series · 3 of 3 positions shown · non-contrast
Comparison: 07/31/2010

CLINICAL DATA: Right flank pain following a fall 2 days ago.

EXAM:
LUMBAR SPINE - 2-3 VIEW

[Series 1: dg lumbar spine 2-3 views · 0.14mm/px · 3 of 3 slices shown]
[im 1/3]
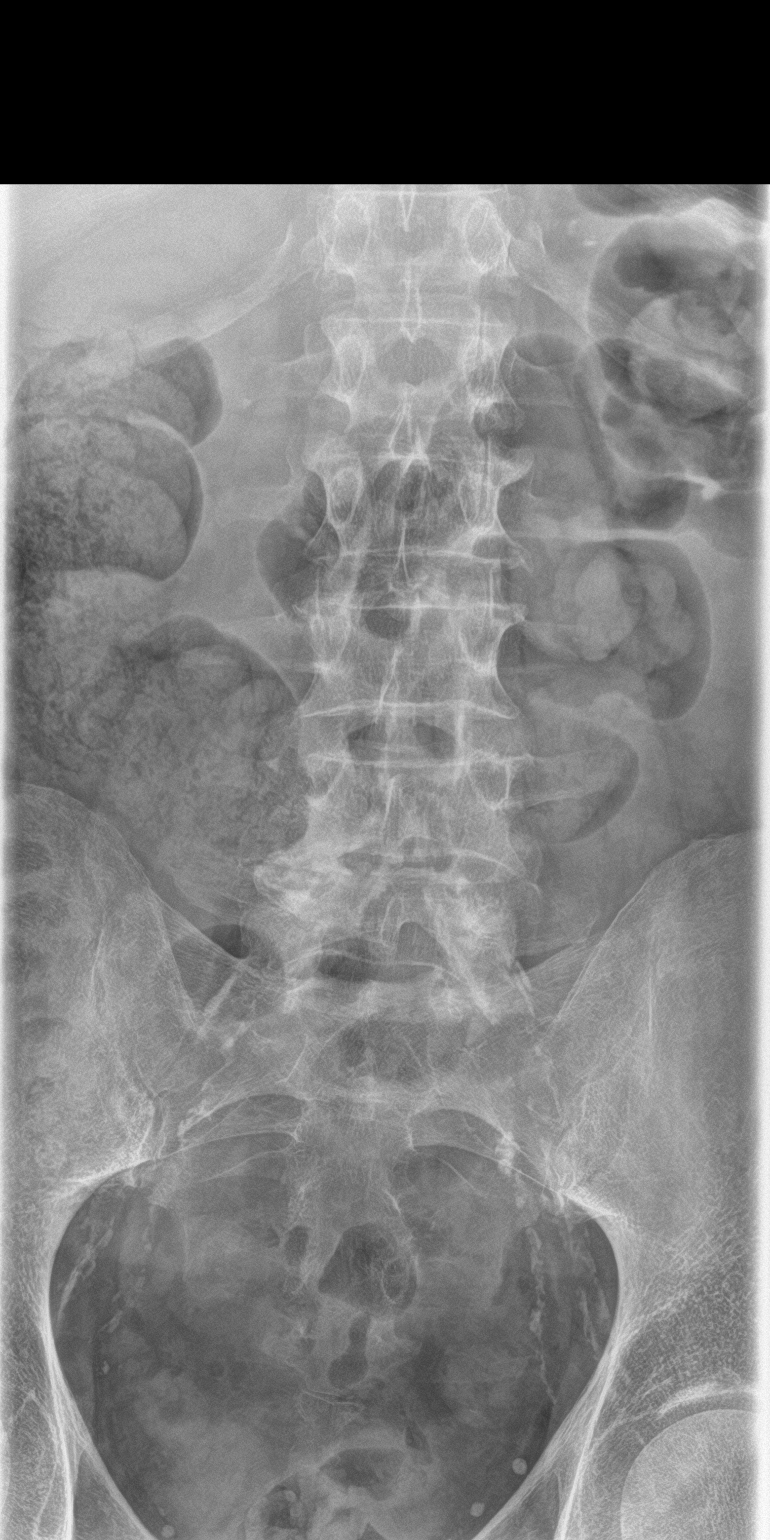
[im 2/3]
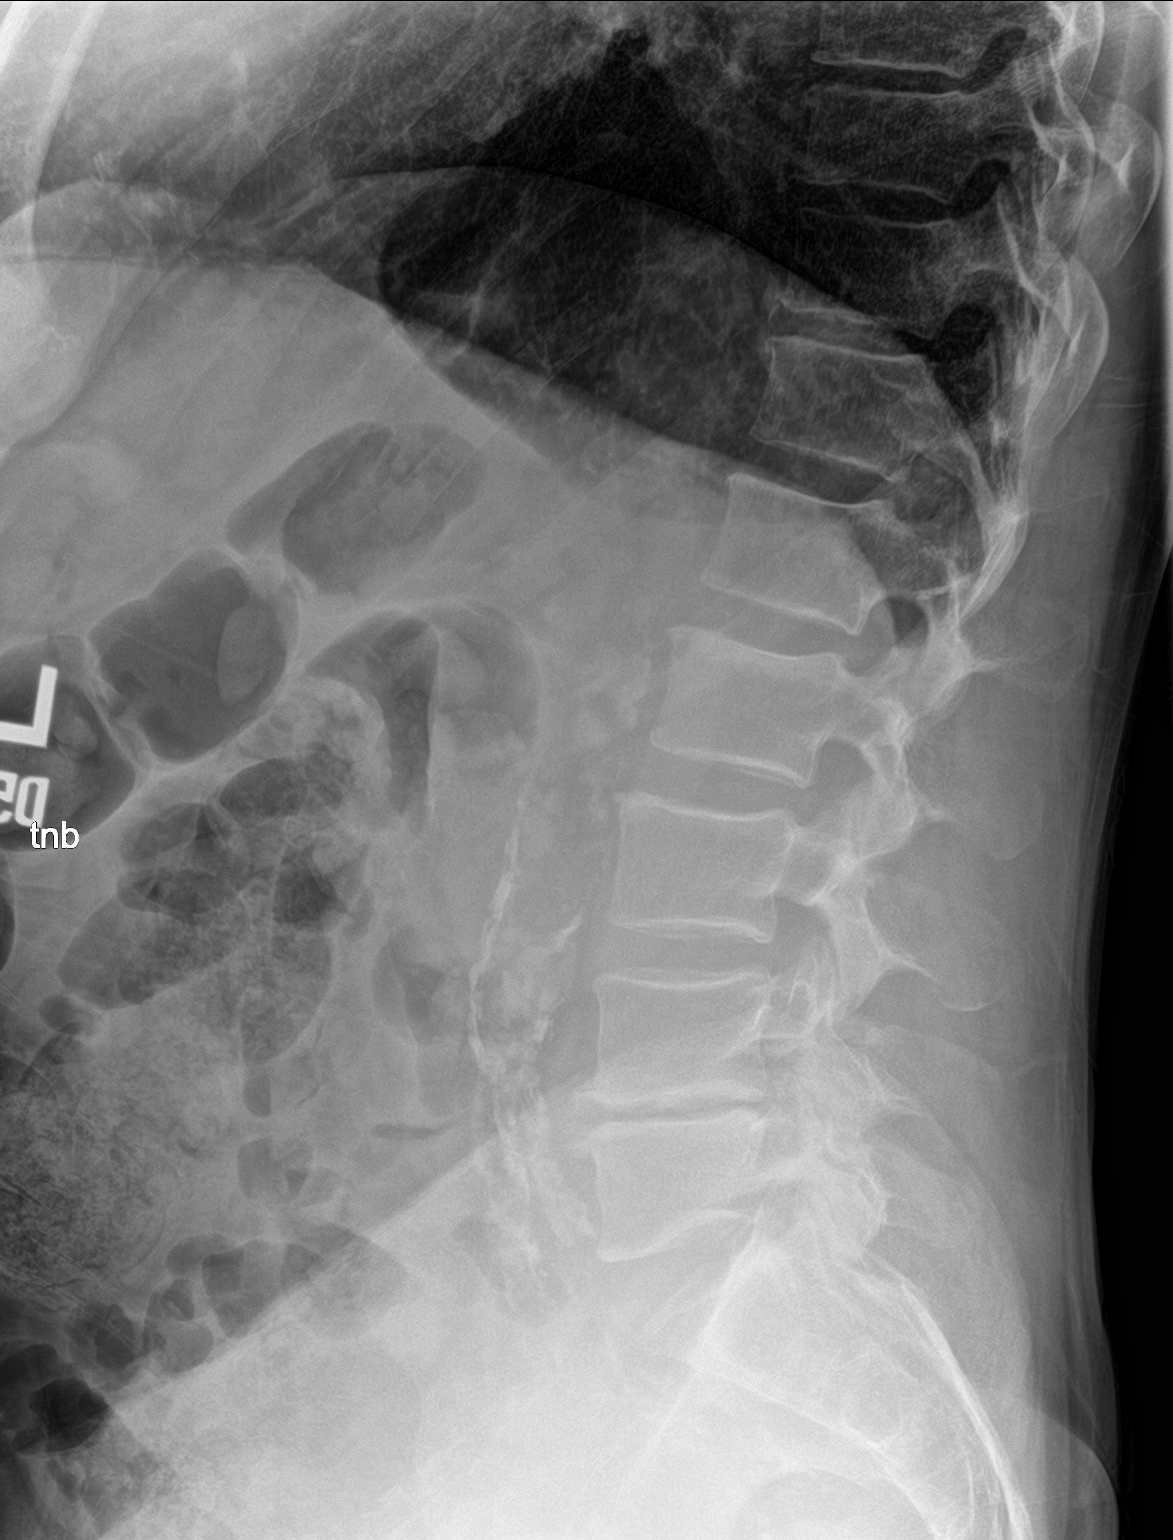
[im 3/3]
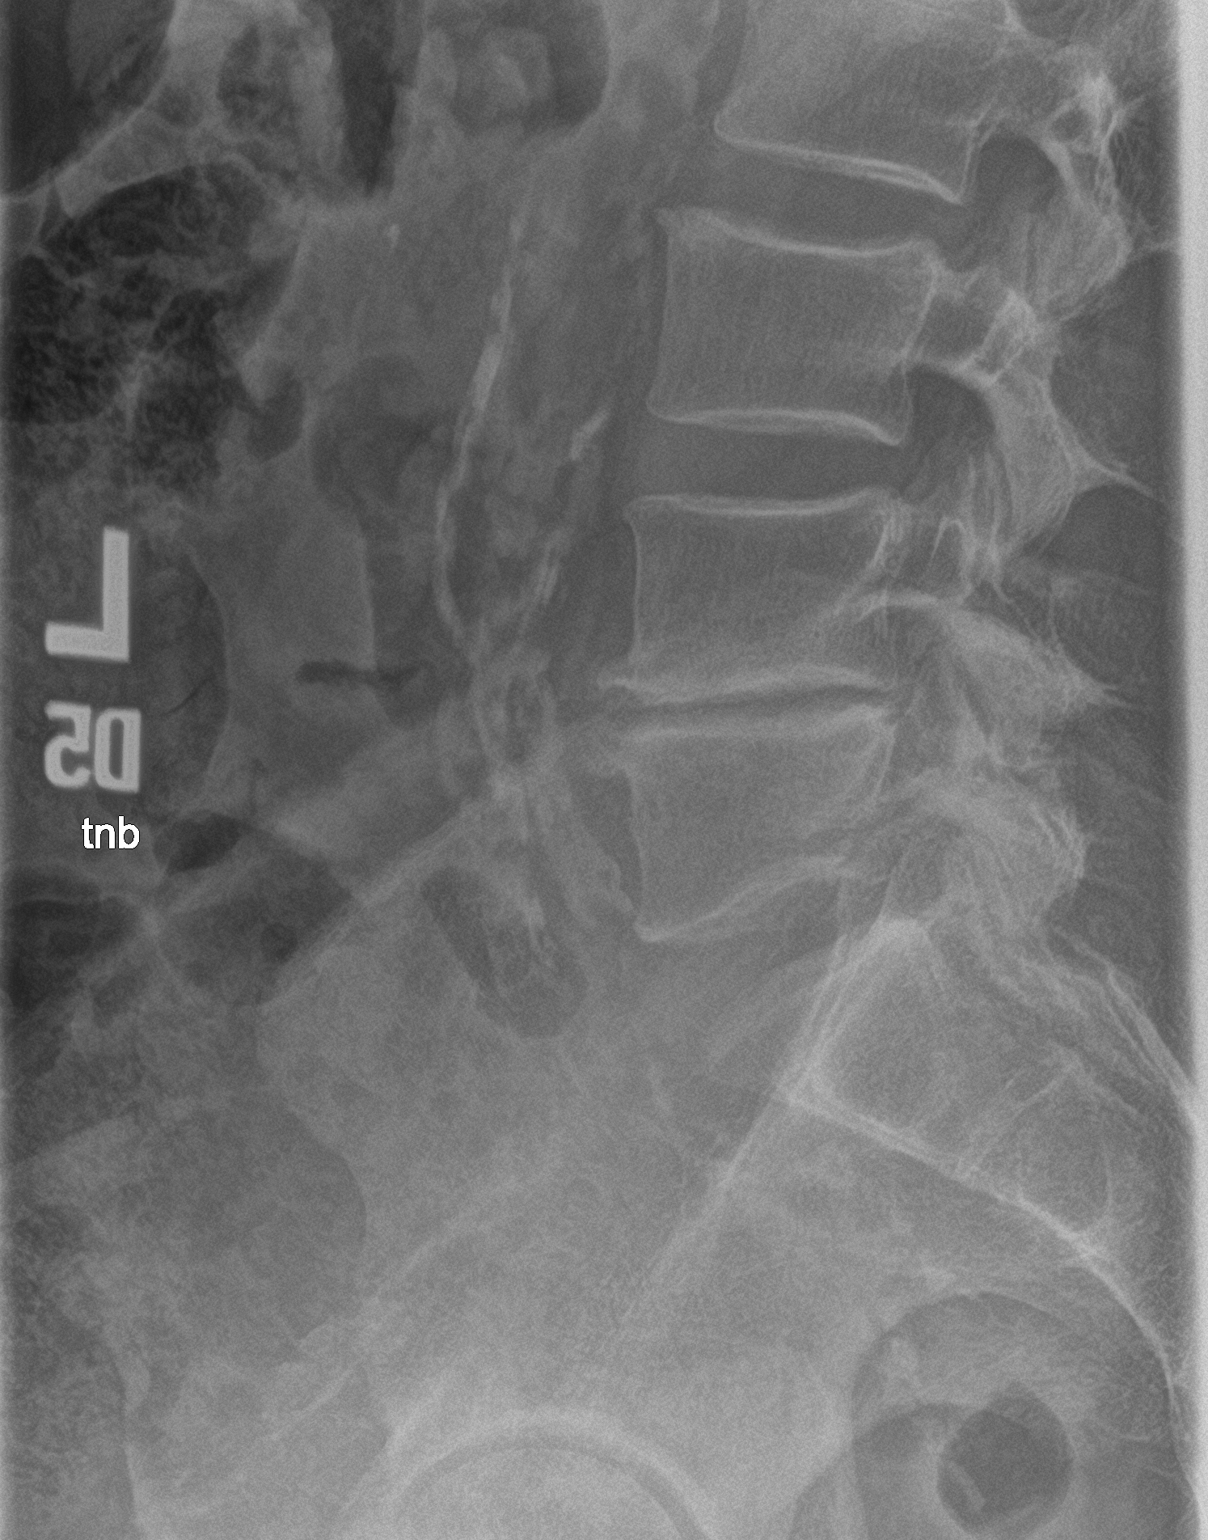

[3 of 3 positions shown; findings below may reference images not displayed]

FINDINGS: Five non-rib-bearing lumbar vertebrae. Marked disc space narrowing
at the L4-5 level with progression. Mild to moderate anterior and
moderate to marked lateral spur formation on the right at that
level. Mild anterior spur formation at other levels of the lumbar
and lower thoracic spine. Facet degenerative changes at the L4-5 and
L5-S1 levels. No fractures, pars defects or subluxations.
Atheromatous arterial calcifications without visible aneurysm.
Prominent stool in the right and transverse colon.
IMPRESSION: 1. No fracture or subluxation.
2. Degenerative changes, as described above.

## 2021-07-28 ENCOUNTER — Other Ambulatory Visit: Payer: Self-pay | Admitting: Nurse Practitioner

## 2021-07-30 NOTE — Telephone Encounter (Signed)
Please schedule F/U appointment with Dr. Arida. Thank you! 

## 2021-07-30 NOTE — Telephone Encounter (Signed)
LVM to schedule follow up

## 2021-08-09 ENCOUNTER — Other Ambulatory Visit: Payer: Self-pay | Admitting: Nurse Practitioner

## 2021-08-09 NOTE — Telephone Encounter (Signed)
Rx refill sent to pharmacy. 

## 2021-08-25 ENCOUNTER — Other Ambulatory Visit: Payer: Self-pay | Admitting: Internal Medicine

## 2021-08-27 NOTE — Telephone Encounter (Signed)
Last filled 06-13-21 #30 Last OV 08-01-20 CPE Next OV 09-14-21 [facility]

## 2021-08-30 ENCOUNTER — Other Ambulatory Visit: Payer: Self-pay | Admitting: Internal Medicine

## 2021-08-31 ENCOUNTER — Other Ambulatory Visit: Payer: Self-pay

## 2021-08-31 DIAGNOSIS — Z122 Encounter for screening for malignant neoplasm of respiratory organs: Secondary | ICD-10-CM

## 2021-08-31 DIAGNOSIS — Z87891 Personal history of nicotine dependence: Secondary | ICD-10-CM

## 2021-09-03 ENCOUNTER — Ambulatory Visit (INDEPENDENT_AMBULATORY_CARE_PROVIDER_SITE_OTHER): Payer: PPO | Admitting: Nurse Practitioner

## 2021-09-03 ENCOUNTER — Encounter: Payer: Self-pay | Admitting: Nurse Practitioner

## 2021-09-03 VITALS — BP 112/70 | HR 72 | Temp 98.0°F | Wt 163.8 lb

## 2021-09-03 DIAGNOSIS — J029 Acute pharyngitis, unspecified: Secondary | ICD-10-CM | POA: Diagnosis not present

## 2021-09-03 DIAGNOSIS — J069 Acute upper respiratory infection, unspecified: Secondary | ICD-10-CM | POA: Diagnosis not present

## 2021-09-03 DIAGNOSIS — J020 Streptococcal pharyngitis: Secondary | ICD-10-CM

## 2021-09-03 LAB — POCT RAPID STREP A (OFFICE): Rapid Strep A Screen: POSITIVE — AB

## 2021-09-03 MED ORDER — AMOXICILLIN-POT CLAVULANATE 875-125 MG PO TABS: 1.0000 | ORAL_TABLET | Freq: Two times a day (BID) | ORAL | 0 refills | Status: DC

## 2021-09-03 MED ORDER — PREDNISONE 20 MG PO TABS
20.0000 mg | ORAL_TABLET | Freq: Every day | ORAL | 0 refills | Status: DC
Start: 2021-09-03 — End: 2021-09-14

## 2021-09-03 NOTE — Progress Notes (Signed)
Acute Office Visit  Subjective:     Patient ID: Franklin Marks, male    DOB: 1950-08-13, 71 y.o.   MRN: [id number]  Chief Complaint  Patient presents with   URI    Pt c/o head/chest congestion w/ no SOB, sore throat, head/body aches, sneezing, runny nose x2 days. At-home Covid test neg 09/02/21.     HPI Patient is in today for congestion, sore throat, body aches for 2 days.  UPPER RESPIRATORY TRACT INFECTION  Fever: no Cough: yes Shortness of breath: no Wheezing: no Chest pain: no Chest tightness: yes Chest congestion: yes Nasal congestion: yes Runny nose: yes Post nasal drip: yes Sneezing: yes Sore throat: yes Swollen glands: yes Sinus pressure: yes Headache: yes Face pain: yes Toothache: no Ear pain: no bilateral Ear pressure: no bilateral Eyes red/itching:yes Eye drainage/crusting: no  Vomiting: no Rash: no Fatigue: yes Sick contacts: yes Strep contacts: no  Context: worse Recurrent sinusitis: no Relief with OTC cold/cough medications:  n/a   Treatments attempted: None   ROS See pertinent positives and negatives per HPI.     Objective:    BP 112/70   Pulse 72   Temp 98 F (36.7 C) (Oral)   Wt 163 lb 12.8 oz (74.3 kg)   SpO2 97%   BMI 24.19 kg/m     Physical Exam Vitals and nursing note reviewed.  Constitutional:      General: He is not in acute distress.    Appearance: Normal appearance.  HENT:     Head: Normocephalic.     Right Ear: Tympanic membrane, ear canal and external ear normal.     Left Ear: Tympanic membrane, ear canal and external ear normal.     Nose:     Right Sinus: Frontal sinus tenderness present. No maxillary sinus tenderness.     Left Sinus: Frontal sinus tenderness present. No maxillary sinus tenderness.     Mouth/Throat:     Pharynx: Posterior oropharyngeal erythema present. No oropharyngeal exudate.  Eyes:     Conjunctiva/sclera: Conjunctivae normal.  Cardiovascular:     Rate and Rhythm: Normal rate and regular  rhythm.     Pulses: Normal pulses.     Heart sounds: Normal heart sounds.  Pulmonary:     Effort: Pulmonary effort is normal.     Breath sounds: Normal breath sounds.  Musculoskeletal:     Cervical back: Normal range of motion.  Skin:    General: Skin is warm.  Neurological:     General: No focal deficit present.     Mental Status: He is alert and oriented to person, place, and time.  Psychiatric:        Mood and Affect: Mood normal.        Behavior: Behavior normal.        Thought Content: Thought content normal.        Judgment: Judgment normal.     Results for orders placed or performed in visit on 09/03/21  POCT rapid strep A  Result Value Ref Range   Rapid Strep A Screen Positive (A) Negative        Assessment & Plan:   Problem List Items Addressed This Visit   None Visit Diagnoses     Strep throat    -  Primary   Will treat with aumgentin BID x10 days with URI symptoms and frontal sinus tenderness. Drink plenty of fluids and rest. Work note given.    Upper respiratory tract infection, unspecified type  He can take OTC coricidin HBP to help symptoms in addition to abx. With chest tightness/COPD, will send in prednisone 20mg  x5 days   Sore throat       Positive POC strep. Can take tylenol as needed for pain, gargle with warm salt water.    Relevant Orders   POCT rapid strep A (Completed)       Meds ordered this encounter  Medications   amoxicillin-clavulanate (AUGMENTIN) 875-125 MG tablet    Sig: Take 1 tablet by mouth 2 (two) times daily.    Dispense:  20 tablet    Refill:  0   predniSONE (DELTASONE) 20 MG tablet    Sig: Take 1 tablet (20 mg total) by mouth daily with breakfast.    Dispense:  5 tablet    Refill:  0    Return if symptoms worsen or fail to improve.  , NP

## 2021-09-03 NOTE — Patient Instructions (Signed)
It was great to see you!  Start coricidin HBP over the counter for your symptoms. I am also starting you on augmentin twice a day with food (antibiotic) and prednisone 1 tablet daily in the morning with food (steroid). Drink plenty of fluids and get some rest.   Let's follow-up if your symptoms worsen or don't improve  Take care,  Rodman Pickle, NP

## 2021-09-04 ENCOUNTER — Ambulatory Visit: Payer: PPO | Admitting: Internal Medicine

## 2021-09-14 ENCOUNTER — Ambulatory Visit
Admission: RE | Admit: 2021-09-14 | Discharge: 2021-09-14 | Disposition: A | Discharge status: Home / Self Care | Payer: PPO | Source: Ambulatory Visit | Attending: Acute Care | Admitting: Acute Care

## 2021-09-14 ENCOUNTER — Encounter: Payer: Self-pay | Admitting: Internal Medicine

## 2021-09-14 ENCOUNTER — Ambulatory Visit (INDEPENDENT_AMBULATORY_CARE_PROVIDER_SITE_OTHER): Payer: PPO | Admitting: Internal Medicine

## 2021-09-14 VITALS — BP 114/70 | HR 66 | Temp 97.6°F | Ht 67.5 in | Wt 168.0 lb

## 2021-09-14 DIAGNOSIS — Z87891 Personal history of nicotine dependence: Secondary | ICD-10-CM | POA: Diagnosis not present

## 2021-09-14 DIAGNOSIS — Z0000 Encounter for general adult medical examination without abnormal findings: Secondary | ICD-10-CM | POA: Diagnosis not present

## 2021-09-14 DIAGNOSIS — Z1211 Encounter for screening for malignant neoplasm of colon: Secondary | ICD-10-CM

## 2021-09-14 DIAGNOSIS — Z125 Encounter for screening for malignant neoplasm of prostate: Secondary | ICD-10-CM

## 2021-09-14 DIAGNOSIS — I739 Peripheral vascular disease, unspecified: Secondary | ICD-10-CM

## 2021-09-14 DIAGNOSIS — K219 Gastro-esophageal reflux disease without esophagitis: Secondary | ICD-10-CM

## 2021-09-14 DIAGNOSIS — Z122 Encounter for screening for malignant neoplasm of respiratory organs: Secondary | ICD-10-CM | POA: Insufficient documentation

## 2021-09-14 DIAGNOSIS — J449 Chronic obstructive pulmonary disease, unspecified: Secondary | ICD-10-CM | POA: Diagnosis not present

## 2021-09-14 DIAGNOSIS — I25119 Atherosclerotic heart disease of native coronary artery with unspecified angina pectoris: Secondary | ICD-10-CM | POA: Diagnosis not present

## 2021-09-14 LAB — COMPREHENSIVE METABOLIC PANEL
ALT: 13 U/L (ref 0–53)
AST: 12 U/L (ref 0–37)
Albumin: 3.9 g/dL (ref 3.5–5.2)
Alkaline Phosphatase: 113 U/L (ref 39–117)
BUN: 16 mg/dL (ref 6–23)
CO2: 26 mEq/L (ref 19–32)
Calcium: 8.9 mg/dL (ref 8.4–10.5)
Chloride: 109 mEq/L (ref 96–112)
Creatinine, Ser: 0.86 mg/dL (ref 0.40–1.50)
GFR: 87.66 mL/min (ref >60.00)
Glucose, Bld: 98 mg/dL (ref 70–99)
Potassium: 4.1 mEq/L (ref 3.5–5.1)
Sodium: 141 mEq/L (ref 135–145)
Total Bilirubin: 0.5 mg/dL (ref 0.2–1.2)
Total Protein: 6.4 g/dL (ref 6.0–8.3)

## 2021-09-14 LAB — CBC
HCT: 37 % — ABNORMAL LOW (ref 39.0–52.0)
Hemoglobin: 12.6 g/dL — ABNORMAL LOW (ref 13.0–17.0)
MCHC: 34.1 g/dL (ref 30.0–36.0)
MCV: 89.4 fl (ref 78.0–100.0)
Platelets: 155 10*3/uL (ref 150.0–400.0)
RBC: 4.14 Mil/uL — ABNORMAL LOW (ref 4.22–5.81)
RDW: 14.8 % (ref 11.5–15.5)
WBC: 7.6 10*3/uL (ref 4.0–10.5)

## 2021-09-14 LAB — LIPID PANEL
Cholesterol: 92 mg/dL (ref 0–200)
HDL: 31.3 mg/dL — ABNORMAL LOW (ref >39.00)
NonHDL: 60.21
Total CHOL/HDL Ratio: 3
Triglycerides: 210 mg/dL — ABNORMAL HIGH (ref 0.0–149.0)
VLDL: 42 mg/dL — ABNORMAL HIGH (ref 0.0–40.0)

## 2021-09-14 LAB — LDL CHOLESTEROL, DIRECT: Direct LDL: 37 mg/dL

## 2021-09-14 LAB — PSA, MEDICARE: PSA: 0.67 ng/ml (ref 0.10–4.00)

## 2021-09-14 NOTE — Progress Notes (Signed)
Vision Screening   Right eye Left eye Both eyes  Without correction 20/25 20/30 20/25   With correction     Hearing Screening - Comments:: Passed whisper test

## 2021-09-14 NOTE — Assessment & Plan Note (Signed)
Mild limitations Okay off cigarettes

## 2021-09-14 NOTE — Assessment & Plan Note (Signed)
No recent exertional symptoms on the atorvastatin, ASA, plavix

## 2021-09-14 NOTE — Assessment & Plan Note (Signed)
Doing well on pantoprazole 20 daily or bid

## 2021-09-14 NOTE — Progress Notes (Signed)
Subjective:    Patient ID: Franklin Marks, male    DOB: 07-22-1950, 71 y.o.   MRN: [id number]  HPI Here for Medicare wellness visit and follow up of chronic health conditions Reviewed advanced directives Reviewed other doctors---Dr Kirke Corin Thayer [facility])---cardiology,  No hospitalizations or surgery this year No tobacco for some years---other than occasional cigar Vision is fine---due for eye exam ([employer] in the past) Hearing is fine Rare beer--not recently No falls  No depression or anhedonia--just stress Stays active with work---playing golf Independent with instrumental ADLs No sig memory issues  Recent strep throat---done with antibiotics Also had dog bite--got between his fighting dogs  Had sleep study Diagnosed with OSA--but declined the equipment--not really having daytime somnolence Stress with wife with breast cancer recurrence  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     atorvastatin (LIPITOR) 80 MG tablet TAKE ONE (1) TABLET BY MOUTH EACH EVENING AT 6PM 90 tablet 0   clonazePAM (KLONOPIN) 0.5 MG tablet TAKE 1 TABLET BY MOUTH EVERY DAY AT BEDTIME AS NEEDED 30 tablet 0   clopidogrel (PLAVIX) 75 MG tablet TAKE ONE TABLET BY MOUTH EACH MORNING WITH BREAKFAST 90 tablet 0   isosorbide mononitrate (IMDUR) 30 MG 24 hr tablet TAKE 1 TABLET BY MOUTH EVERY DAY 90 tablet 0   lisinopril (ZESTRIL) 5 MG tablet TAKE 1/2 TABLET BY MOUTH DAILY 45 tablet 3   Omega-3 Fatty Acids (FISH OIL)  1000 MG CAPS Take 1,000 mg by mouth in the morning and at bedtime.     pantoprazole (PROTONIX) 20 MG tablet TAKE 1 TABLET BY MOUTH EVERY DAY AS NEEDED 90 tablet 3   No current facility-administered medications on file prior to visit.    Allergies  Allergen Reactions   Codeine Nausea And Vomiting    Past Medical History:  Diagnosis Date   Anemia    Atrial fibrillation (HCC) 01/13/2012   CAD (coronary artery disease)    a. 2001 s/p CABGx5 (LIMA->LAD, VG->D1, VG->D2, VG->RPAV->RPDA; b. 02/2015 NSTEMI/PCI: VG->D2 95 (3.6x16 Promus DES). VG->D1 100; c. 06/2020 PCI: LM 75, LAD 100ost, D1 70, D2 70, LCX 1m, RCA 100ost, RPAV 100, VG->RPAV->RPDA 80ost (4.5x12 Synergy DES), VG->D1 100, VG->D2 ok, LIMA->LAD ok; d. 07/2020 PCI LM/LCX (2.5x15 Resolute Onyx DES).   COPD (chronic obstructive pulmonary disease) (HCC)    ongoing tobacco use   GERD (gastroesophageal reflux disease)    HFimpEF (heart failure with improved ejection fraction) (HCC)    a. 12/2011 Echo: EF 35-40%; b. 06/2020 Echo: EF 55-60%   Ischemic cardiomyopathy 01/10/2012   a. 12/2011 Echo: EF 35-40%, mild conc LVH,  sev HK of anteroseptum, borderline RVH; b. 06/2020 Echo: EF 55-60%, no rwma, triv MR. Mild Ao sclerosis.   NSTEMI (non-ST elevated myocardial infarction) (HCC) 01/10/2012   Associated with ventricular fibrillation   OSA (obstructive sleep apnea)    a. 07/2020 WatchPat1: 1. Moderate Obstructive Sleep Apnea with AHI 16.5/hr. O2 sat down to 80%.    Past Surgical History:  Procedure Laterality Date  CARDIAC CATHETERIZATION  2011   [facility]   CARDIAC CATHETERIZATION  12/2011   prox LAD occlusion, ostial RCA occlusion, 30% prox LCx stenosis, LIMA-LAD: 80% post-anastamosis lesion, SVG-Dx2: old occlusion w/ thrombus (noted on prior 2011 cath at [facility]), SVG-Dx: patent, SVG-RCA: patent, diffuse irregs, 80-90% PDA/PLA lesions patent   CARDIAC CATHETERIZATION N/A 03/15/2015   Procedure: Left Heart Cath and Cors/Grafts Angiography;   Surgeon: Kathleene Hazel, MD;  Location: [facility] INVASIVE CV LAB;  Service: Cardiovascular;  Laterality: N/A;   CARDIAC CATHETERIZATION N/A 03/15/2015   Procedure: Coronary Stent Intervention;  Surgeon: Kathleene Hazel, MD;  Location: [facility] INVASIVE CV LAB;  Service: Cardiovascular;  Laterality: N/A;  svg to diagnoal 2   CORONARY ARTERY BYPASS GRAFT  2001   CORONARY STENT INTERVENTION N/A 07/10/2020   Procedure: CORONARY STENT INTERVENTION;  Surgeon: Corky Crafts, MD;  Location: [facility] INVASIVE CV LAB;  Service: Cardiovascular;  Laterality: N/A;   CORONARY STENT INTERVENTION N/A 07/26/2020   Procedure: CORONARY STENT INTERVENTION;  Surgeon: Iran Ouch, MD;  Location: [facility] INVASIVE CV LAB;  Service: Cardiovascular;  Laterality: N/A;   INTRAVASCULAR IMAGING/OCT N/A 07/26/2020   Procedure: INTRAVASCULAR IMAGING/OCT;  Surgeon: Iran Ouch, MD;  Location: [facility] INVASIVE CV LAB;  Service: Cardiovascular;  Laterality: N/A;   INTRAVASCULAR ULTRASOUND/IVUS N/A 07/10/2020   Procedure: Intravascular Ultrasound/IVUS;  Surgeon: Corky Crafts, MD;  Location: [facility] INVASIVE CV LAB;  Service: Cardiovascular;  Laterality: N/A;   LEFT HEART CATH AND CORS/GRAFTS ANGIOGRAPHY N/A 07/10/2020   Procedure: LEFT HEART CATH AND CORS/GRAFTS ANGIOGRAPHY;  Surgeon: Corky Crafts, MD;  Location: [facility] INVASIVE CV LAB;  Service: Cardiovascular;  Laterality: N/A;   LEFT HEART CATHETERIZATION WITH CORONARY ANGIOGRAM N/A 01/10/2012   Procedure: LEFT HEART CATHETERIZATION WITH CORONARY ANGIOGRAM;  Surgeon: Lennette Bihari, MD;  Location: [facility] CATH LAB;  Service: Cardiovascular;  Laterality: N/A;    Family History  Problem Relation Age of Onset   Hyperlipidemia Mother    Alcohol abuse Father    Hyperlipidemia Father    Heart disease Sister    Hyperlipidemia Sister    Heart disease Brother    Hyperlipidemia Brother    Hyperlipidemia Sister    Diabetes Neg Hx    Hypertension Neg Hx     Social History    Socioeconomic History   Marital status: Married    Spouse name: Not on file   Number of children: 4   Years of education: Not on file   Highest education level: Not on file  Occupational History   Occupation: Management --now disabled   Occupation: Holiday representative work    Comment: Part--time  Tobacco Use   Smoking status: Former    Packs/day: 1.00    Years: 45.00    Total pack years: 45.00    Types: Cigarettes    Quit date: 03/13/2015    Years since quitting: 6.5    Passive exposure: Current (as a child)   Smokeless tobacco: Never  Vaping Use   Vaping Use: Never used  Substance and Sexual Activity   Alcohol use: Not Currently    Comment: really rare beer   Drug use: Yes    Types: Marijuana   Sexual activity: Not Currently  Other Topics Concern   Not on file  Social History Narrative   Married twice   3 children from 1st marriage-- 1 from second   Worked as Investment banker, operational      Has living will   Wife has health care  POA---alternate would be daughter Judeth Cornfield   Would accept resuscitation--- but no prolonged ventilation   No tube feeds if cognitively unaware   Social Determinants of Health   Financial Resource Strain: Not on file  Food Insecurity: Not on file  Transportation Needs: Not on file  Physical Activity: Not on file  Stress: Not on file  Social Connections: Not on file  Intimate Partner Violence: Not on file   Review of Systems Appetite is good Weight is stable Sleeps fairly good---occasionally  takes the clonazepam (if he gets some anxiety) Has dentures--bottoms don't fit well Wears seat belt No suspicious skin lesions Bowels are fine--no blood Occasional back pain--usually if twisted in golf. Nothing consistent. No other joint pains     Objective:   Physical Exam Constitutional:      Appearance: Normal appearance.  HENT:     Mouth/Throat:     Comments: No lesions Eyes:     Conjunctiva/sclera: Conjunctivae normal.     Pupils: Pupils  are equal, round, and reactive to light.  Cardiovascular:     Rate and Rhythm: Normal rate and regular rhythm.     Heart sounds:     No gallop.     Comments: Slight mitral systolic murmur No palpable pulses Pulmonary:     Effort: Pulmonary effort is normal.     Breath sounds: No wheezing or rales.     Comments: ?slightly decreased breath sounds--but clear Abdominal:     Palpations: Abdomen is soft.     Tenderness: There is no abdominal tenderness.  Musculoskeletal:     Cervical back: Neck supple.     Right lower leg: No edema.     Left lower leg: No edema.  Lymphadenopathy:     Cervical: No cervical adenopathy.  Skin:    Findings: No lesion or rash.  Neurological:     General: No focal deficit present.     Mental Status: He is alert and oriented to person, place, and time.     Comments: Mini-cog normal  Psychiatric:        Mood and Affect: Mood normal.        Behavior: Behavior normal.            Assessment & Plan:

## 2021-09-14 NOTE — Assessment & Plan Note (Signed)
Gets worn out easy but no chest pain on the isosorbide 30 Atorvastatin 80, ASA 81mg , plavix 75, lisinopril 5mg 

## 2021-09-14 NOTE — Assessment & Plan Note (Signed)
I have personally reviewed the Medicare Annual Wellness questionnaire and have noted 1. The patient's medical and social history 2. Their use of alcohol, tobacco or illicit drugs 3. Their current medications and supplements 4. The patient's functional ability including ADL's, fall risks, home safety risks and hearing or visual             impairment. 5. Diet and physical activities 6. Evidence for depression or mood disorders  The patients weight, height, BMI and visual acuity have been recorded in the chart I have made referrals, counseling and provided education to the patient based review of the above and I have provided the pt with a written personalized care plan for preventive services.  I have provided you with a copy of your personalized plan for preventive services. Please take the time to review along with your updated medication list.  Continues with lung cancer screening FIT again Will check last PSA today Discussed exercise Prefers no shingrix or additional COVID vaccine (had 3) Flu vaccine in the fall

## 2021-09-17 ENCOUNTER — Other Ambulatory Visit: Payer: Self-pay

## 2021-09-17 DIAGNOSIS — Z122 Encounter for screening for malignant neoplasm of respiratory organs: Secondary | ICD-10-CM

## 2021-09-17 DIAGNOSIS — Z87891 Personal history of nicotine dependence: Secondary | ICD-10-CM

## 2021-09-17 NOTE — Progress Notes (Unsigned)
Cardiology Clinic Note   Patient Name: Franklin Marks Date of Encounter: 09/19/2021  Primary Care Provider:  [name], MD Primary Cardiologist:  Lorine Bears, MD  Patient Profile    71 year old male with a history of CAD status post remote CABG and more recent stenting, peripheral artery disease, chronic heart failure with improved ejection fraction, ischemic cardiomyopathy, hyperlipidemia, remote tobacco abuse, COPD, gastroesophageal reflux disease, anemia, obstructive sleep apnea, and remote atrial fibrillation presents for follow-up of his coronary artery disease.  Past Medical History    Past Medical History:  Diagnosis Date   Anemia    Atrial fibrillation (HCC) 01/13/2012   CAD (coronary artery disease)    a. 2001 s/p CABGx5 (LIMA->LAD, VG->D1, VG->D2, VG->RPAV->RPDA; b. 02/2015 NSTEMI/PCI: VG->D2 95 (3.6x16 Promus DES). VG->D1 100; c. 06/2020 PCI: LM 75, LAD 100ost, D1 70, D2 70, LCX 77m, RCA 100ost, RPAV 100, VG->RPAV->RPDA 80ost (4.5x12 Synergy DES), VG->D1 100, VG->D2 ok, LIMA->LAD ok; d. 07/2020 PCI LM/LCX (2.5x15 Resolute Onyx DES).   COPD (chronic obstructive pulmonary disease) (HCC)    ongoing tobacco use   GERD (gastroesophageal reflux disease)    HFimpEF (heart failure with improved ejection fraction) (HCC)    a. 12/2011 Echo: EF 35-40%; b. 06/2020 Echo: EF 55-60%   Ischemic cardiomyopathy 01/10/2012   a. 12/2011 Echo: EF 35-40%, mild conc LVH,  sev HK of anteroseptum, borderline RVH; b. 06/2020 Echo: EF 55-60%, no rwma, triv MR. Mild Ao sclerosis.   NSTEMI (non-ST elevated myocardial infarction) (HCC) 01/10/2012   Associated with ventricular fibrillation   OSA (obstructive sleep apnea)    a. 07/2020 WatchPat1: 1. Moderate Obstructive Sleep Apnea with AHI 16.5/hr. O2 sat down to 80%.   Past Surgical History:  Procedure Laterality Date   CARDIAC CATHETERIZATION  2011   At [facility]   CARDIAC CATHETERIZATION  12/2011   prox LAD occlusion, ostial RCA occlusion,  30% prox LCx stenosis, LIMA-LAD: 80% post-anastamosis lesion, SVG-Dx2: old occlusion w/ thrombus (noted on prior 2011 cath at [facility]), SVG-Dx: patent, SVG-RCA: patent, diffuse irregs, 80-90% PDA/PLA lesions patent   CARDIAC CATHETERIZATION N/A 03/15/2015   Procedure: Left Heart Cath and Cors/Grafts Angiography;  Surgeon: Kathleene Hazel, MD;  Location: [facility] INVASIVE CV LAB;  Service: Cardiovascular;  Laterality: N/A;   CARDIAC CATHETERIZATION N/A 03/15/2015   Procedure: Coronary Stent Intervention;  Surgeon: Kathleene Hazel, MD;  Location: [facility] INVASIVE CV LAB;  Service: Cardiovascular;  Laterality: N/A;  svg to diagnoal 2   CORONARY ARTERY BYPASS GRAFT  2001   CORONARY STENT INTERVENTION N/A 07/10/2020   Procedure: CORONARY STENT INTERVENTION;  Surgeon: Corky Crafts, MD;  Location: [facility] INVASIVE CV LAB;  Service: Cardiovascular;  Laterality: N/A;   CORONARY STENT INTERVENTION N/A 07/26/2020   Procedure: CORONARY STENT INTERVENTION;  Surgeon: Iran Ouch, MD;  Location: [facility] INVASIVE CV LAB;  Service: Cardiovascular;  Laterality: N/A;   INTRAVASCULAR IMAGING/OCT N/A 07/26/2020   Procedure: INTRAVASCULAR IMAGING/OCT;  Surgeon: Iran Ouch, MD;  Location: [facility] INVASIVE CV LAB;  Service: Cardiovascular;  Laterality: N/A;   INTRAVASCULAR ULTRASOUND/IVUS N/A 07/10/2020   Procedure: Intravascular Ultrasound/IVUS;  Surgeon: Corky Crafts, MD;  Location: [facility] INVASIVE CV LAB;  Service: Cardiovascular;  Laterality: N/A;   LEFT HEART CATH AND CORS/GRAFTS ANGIOGRAPHY N/A 07/10/2020   Procedure: LEFT HEART CATH AND CORS/GRAFTS ANGIOGRAPHY;  Surgeon: Corky Crafts, MD;  Location: [facility] INVASIVE CV LAB;  Service: Cardiovascular;  Laterality: N/A;   LEFT HEART CATHETERIZATION WITH CORONARY ANGIOGRAM N/A 01/10/2012  Procedure: LEFT HEART CATHETERIZATION WITH CORONARY ANGIOGRAM;  Surgeon: Lennette Bihari, MD;  Location: [facility] CATH LAB;  Service: Cardiovascular;  Laterality: N/A;     Allergies  Allergies  Allergen Reactions   Codeine Nausea And Vomiting    History of Present Illness    71 year old male with the above past medical history including CAD, peripheral arterial disease with known chronic total occlusion of bilateral SFAs with distal reconstitution via collaterals, chronic heart failure with improved ejection fraction, ischemic cardiomyopathy, remote tobacco abuse, COPD, gastroesophageal reflux disease, anemia, and remote atrial fibrillation.  Cardiac history dates back to 2001, when he suffered cardiac arrest and required coronary bypass grafting x5.  He subsequently suffered a non-STEMI in December 2016 and underwent PCI with stenting to the vein graft to the D2.  EF at that time was 35 to 45%.  In July 2021, he had worsening dyspnea with progressive weight gain.  Carvedilol was discontinued secondary to symptomatic bradycardia.  Echocardiogram was repeated in August 2021 which showed an EF of 50 to 55%.  In late April 2022, he was admitted to [facility] with chest pain with normal troponins.  Diagnostic catheterization revealed severe disease of the ostial vein graft to the RPAV to the RPDA.  He also had severe left main disease extending to the native left circumflex which was not previously graft.  He underwent PCI with DES placement to the vein graft RPA V to the RPDA, with subsequent staged PCI and drug-eluting stent placement to the distal left main/ostial left circumflex during the same admission.  Echocardiogram at that time showed EF improved to 55 to 60%.  Unfortunately he was found to have a Mobitz type I heart block during that admission as well as symptomatic bradycardia with rates in the 30s during periods of sleep.  He subsequently underwent outpatient sleep study which showed moderate obstructive sleep apnea and and he was recommended to start on CPAP.   Franklin Marks was last seen in cardiology clinic October 2022.  At that time he was doing well with no  medication changes that were needed.  He was subsequently still waiting for CPAP equipment at the time of his appointment.  He was seen in clinic today without any complaints. He recently had his blood work drawn and was concerned about his triglycerides being elevated. He denies any chest pain, shortness of breath, near syncope, but does have a cough this morning related to he believes sleeping under the air conditioner last evening and chronic dyspnea on exertion. He does have an increased amount of stress unfortunately his wife has just been diagnosed with cancer the second time. He was also previously to follow up about getting his CPAP equipment, but he stated the insurance would not cover the CPAP so he remains without treatment of his moderate obstructive sleep apnea.  Home Medications    Current Outpatient Medications  Medication Sig Dispense Refill   aspirin 81 MG chewable tablet Chew 1 tablet (81 mg total) by mouth daily.     atorvastatin (LIPITOR) 80 MG tablet TAKE ONE (1) TABLET BY MOUTH EACH EVENING AT 6PM 90 tablet 0   clonazePAM (KLONOPIN) 0.5 MG tablet TAKE 1 TABLET BY MOUTH EVERY DAY AT BEDTIME AS NEEDED 30 tablet 0   clopidogrel (PLAVIX) 75 MG tablet TAKE ONE TABLET BY MOUTH EACH MORNING WITH BREAKFAST 90 tablet 0   isosorbide mononitrate (IMDUR) 30 MG 24 hr tablet TAKE 1 TABLET BY MOUTH EVERY DAY 90 tablet 0  lisinopril (ZESTRIL) 5 MG tablet TAKE 1/2 TABLET BY MOUTH DAILY 45 tablet 3   Omega-3 Fatty Acids (FISH OIL) 1000 MG CAPS Take 1,000 mg by mouth in the morning and at bedtime.     pantoprazole (PROTONIX) 20 MG tablet TAKE 1 TABLET BY MOUTH EVERY DAY AS NEEDED 90 tablet 3   No current facility-administered medications for this visit.     Family History    Family History  Problem Relation Age of Onset   Hyperlipidemia Mother    Alcohol abuse Father    Hyperlipidemia Father    Heart disease Sister    Hyperlipidemia Sister    Heart disease Brother    Hyperlipidemia  Brother    Hyperlipidemia Sister    Diabetes Neg Hx    Hypertension Neg Hx    He indicated that his mother is deceased. He indicated that his father is deceased. He indicated that only one of his two sisters is alive. He indicated that his brother is deceased. He indicated that the status of his neg hx is unknown.  Social History    Social History   Socioeconomic History   Marital status: Married    Spouse name: Not on file   Number of children: 4   Years of education: Not on file   Highest education level: Not on file  Occupational History   Occupation: Management --now disabled   Occupation: Holiday representative work    Comment: Part--time  Tobacco Use   Smoking status: Former    Packs/day: 1.00    Years: 45.00    Total pack years: 45.00    Types: Cigarettes    Quit date: 03/13/2015    Years since quitting: 6.5    Passive exposure: Current (as a child)   Smokeless tobacco: Never  Vaping Use   Vaping Use: Never used  Substance and Sexual Activity   Alcohol use: Not Currently    Comment: really rare beer   Drug use: Yes    Types: Marijuana   Sexual activity: Not Currently  Other Topics Concern   Not on file  Social History Narrative   Married twice   3 children from 1st marriage-- 1 from second   Worked as Investment banker, operational      Has living will   Wife has health care POA---alternate would be daughter Judeth Cornfield   Would accept resuscitation--- but no prolonged ventilation   No tube feeds if cognitively unaware   Social Determinants of Health   Financial Resource Strain: Not on file  Food Insecurity: Not on file  Transportation Needs: Not on file  Physical Activity: Not on file  Stress: Not on file  Social Connections: Not on file  Intimate Partner Violence: Not on file     Review of Systems    General:  No chills, fever, night sweats or weight changes. Endorses fatigue, Cardiovascular:  No chest pain, dyspnea on exertion, edema, orthopnea, palpitations,  paroxysmal nocturnal dyspnea. Dermatological: No rash, lesions/masses Respiratory: endorses nonproductive cough and chronic dyspnea that is unchanged Urologic: No hematuria, dysuria Abdominal:   No nausea, vomiting, diarrhea, bright red blood per rectum, melena, or hematemesis Neurologic:  No visual changes, wkns, changes in mental status. All other systems reviewed and are otherwise negative except as noted above.     Physical Exam    VS:  BP 110/74 (BP Location: Left Arm, Patient Position: Sitting, Cuff Size: Normal)   Pulse (!) 56   Ht 5\' 8"  (1.727 m)   Wt 169  lb 8 oz (76.9 kg)   SpO2 98%   BMI 25.77 kg/m  , BMI Body mass index is 25.77 kg/m.     GEN: Well nourished, well developed, in no acute distress. HEENT: normal. Neck: Supple, no JVD, carotid bruits, or masses. Cardiac: RRR, no murmurs, rubs, or gallops. No clubbing, cyanosis, edema.  Radials/DP/PT 2+ and equal bilaterally.  Respiratory:  Respirations regular and unlabored, clear to auscultation bilaterally. GI: Soft, nontender, nondistended, BS + x 4. MS: no deformity or atrophy. Skin: warm and dry, no rash. Neuro:  Strength and sensation are intact. Psych: Normal affect.  Accessory Clinical Findings    ECG personally reviewed by me today- Sinus bradycardia rate of 56, right bundle branch block, first degree AVB - No acute changes  Lab Results  Component Value Date   WBC 7.6 09/14/2021   HGB 12.6 (L) 09/14/2021   HCT 37.0 (L) 09/14/2021   MCV 89.4 09/14/2021   PLT 155.0 09/14/2021   Lab Results  Component Value Date   CREATININE 0.86 09/14/2021   BUN 16 09/14/2021   NA 141 09/14/2021   K 4.1 09/14/2021   CL 109 09/14/2021   CO2 26 09/14/2021   Lab Results  Component Value Date   ALT 13 09/14/2021   AST 12 09/14/2021   ALKPHOS 113 09/14/2021   BILITOT 0.5 09/14/2021   Lab Results  Component Value Date   CHOL 92 09/14/2021   HDL 31.30 (L) 09/14/2021   LDLCALC 42 07/08/2020   LDLDIRECT 37.0  09/14/2021   TRIG 210.0 (H) 09/14/2021   CHOLHDL 3 09/14/2021    Lab Results  Component Value Date   HGBA1C 5.9 (H) 07/08/2020    Assessment & Plan   1.  Coronary artery disease s/p remote CABG with stenting of the ostial vein graft to the RPAV and RPDA, distal left main/ostial left circumflex in April of 2022. He has been doing fairly well and has been without chest pain. He continues to have chronic dyspnea on exertion that remains unchanged. He is to continue on asa 81 mg daily, plavix 75 mg daily, lisinopril 5 mg daily, and imdur 30 mg daily. He is to remain off of beat blocker therapy due to bradycardia, first dregree AVB and RBBB on EKG  2.Ischemic cardiomyopathy/heart failure with improved EF 55-60% in April 2022. He continues to remain euvolemic on exam with pulse in the 50's and stable blood pressure. Continue lisinopril 5 mg daily  3.Hyperlipidemia LDL 42 on 09/14/21. Continue atorvastatin 80 mg daily and omega 3 fatty acid bid  4.Sinus Bradycardia with heart rate today of 56. Denies near syncope  or syncope. Continue to avoid AV nodal blocking agents. EKG today reveals SB, first degree AVB and RBBB.  5.Obstructive sleep apnea was moderate obstructive based on testing completed in May 2022. Recommend reschedule appointment with Dr Mayford Knife in [place] to have follow up regarding not being able to afford CPAP and what other treatment options are available to him.   6.Peripheral arterial disease without claudication symptoms, PT pulses are palpated bilaterally. Continue plavix 75 mg daily, asa 81 mg daily, and atorvastatin 80 mg daily  Disposition: Follow up in clinic in 6 months with EKG on return of sooner if needed.   Tyronn Golda, NP 09/19/2021, 9:34 AM

## 2021-09-19 ENCOUNTER — Ambulatory Visit (INDEPENDENT_AMBULATORY_CARE_PROVIDER_SITE_OTHER): Payer: PPO | Admitting: Cardiology

## 2021-09-19 ENCOUNTER — Encounter: Payer: Self-pay | Admitting: Medical

## 2021-09-19 VITALS — BP 110/74 | HR 56 | Ht 68.0 in | Wt 169.5 lb

## 2021-09-19 DIAGNOSIS — I739 Peripheral vascular disease, unspecified: Secondary | ICD-10-CM | POA: Diagnosis not present

## 2021-09-19 DIAGNOSIS — I10 Essential (primary) hypertension: Secondary | ICD-10-CM | POA: Diagnosis not present

## 2021-09-19 DIAGNOSIS — I2510 Atherosclerotic heart disease of native coronary artery without angina pectoris: Secondary | ICD-10-CM

## 2021-09-19 DIAGNOSIS — I255 Ischemic cardiomyopathy: Secondary | ICD-10-CM | POA: Diagnosis not present

## 2021-09-19 DIAGNOSIS — G4733 Obstructive sleep apnea (adult) (pediatric): Secondary | ICD-10-CM

## 2021-09-19 DIAGNOSIS — E785 Hyperlipidemia, unspecified: Secondary | ICD-10-CM

## 2021-09-19 NOTE — Patient Instructions (Signed)
Medication Instructions:   Your physician recommends that you continue on your current medications as directed. Please refer to the Current Medication list given to you today.  *If you need a refill on your cardiac medications before your next appointment, please call your pharmacy*    Follow-Up: At [facility], you and your health needs are our priority.  As part of our continuing mission to provide you with exceptional heart care, we have created designated Provider Care Teams.  These Care Teams include your primary Cardiologist (physician) and Advanced Practice Providers (APPs -  Physician Assistants and Nurse Practitioners) who all work together to provide you with the care you need, when you need it.  We recommend signing up for the patient portal called "MyChart".  Sign up information is provided on this After Visit Summary.  MyChart is used to connect with patients for Virtual Visits (Telemedicine).  Patients are able to view lab/test results, encounter notes, upcoming appointments, etc.  Non-urgent messages can be sent to your provider as well.   To learn more about what you can do with MyChart, go to [email or web address].    Your next appointment:   6 month(s)  The format for your next appointment:   In Person  Provider:   You may see Lorine Bears, MD or one of the following Advanced Practice Providers on your designated Care Team:   Nicolasa Ducking, NP Eula Listen, PA-C Cadence Fransico Michael, [place]    Other Instructions   Important Information About Sugar

## 2021-09-21 ENCOUNTER — Telehealth: Payer: Self-pay | Admitting: *Deleted

## 2021-09-21 NOTE — Telephone Encounter (Signed)
-----   Message from Gibson Ramp, RN sent at 09/19/2021 10:03 AM EDT ----- Regarding: CPAP This patient was seen today and we wanted to follow up with his CPAP as it looks like has not been contacted about getting his machine or has been scheduled for his follow up.  Thank you for looking into this!  Coleen RN

## 2021-09-21 NOTE — Addendum Note (Signed)
Addended by: Reesa Chew on: 09/21/2021 08:41 AM   Modules accepted: Orders

## 2021-09-21 NOTE — Telephone Encounter (Addendum)
Per [employer] [facility] the patient was a no call no show 10-02-20. [employer] attempted to call the patient but they never heard back from him so the order was voided 11/15/20. [employer] will reinstate the patient once the order is updated as they can use his recent office note. Patient notified.

## 2021-10-29 ENCOUNTER — Encounter: Payer: Self-pay | Admitting: Internal Medicine

## 2021-10-29 ENCOUNTER — Telehealth: Payer: Self-pay | Admitting: Internal Medicine

## 2021-10-29 ENCOUNTER — Telehealth (INDEPENDENT_AMBULATORY_CARE_PROVIDER_SITE_OTHER): Payer: PPO | Admitting: Internal Medicine

## 2021-10-29 VITALS — Ht 70.0 in | Wt 168.0 lb

## 2021-10-29 DIAGNOSIS — J988 Other specified respiratory disorders: Secondary | ICD-10-CM

## 2021-10-29 DIAGNOSIS — U071 COVID-19: Secondary | ICD-10-CM | POA: Diagnosis not present

## 2021-10-29 MED ORDER — NIRMATRELVIR/RITONAVIR (PAXLOVID)TABLET: 3.0000 | ORAL_TABLET | Freq: Two times a day (BID) | ORAL | 0 refills | Status: AC

## 2021-10-29 NOTE — Telephone Encounter (Signed)
Patient has virtual scheduled for today. No action needed.

## 2021-10-29 NOTE — Telephone Encounter (Signed)
Patient called in stating that he took a home covid test,and he's positive. He wanted to know if he could get something called in to the pharmacy?

## 2021-10-29 NOTE — Progress Notes (Signed)
Virtual Visit via Video Note  I connected with Franklin Marks on 10/29/21 at  3:00 PM EDT by a video enabled telemedicine application and verified that I am speaking with the correct person using two identifiers. Location patient: home Location provider:work office Persons participating in the virtual visit: patient, provider  .  Patient aware  of the limitations of evaluation and management by telemedicine and  availability of in person appointments. and agreed to proceed.   HPI: Franklin Marks presents for video visit because he has had 2+ days of respiratory COVID-like symptoms and tested positive yesterday at home.  Onset initially of body aches 2 days ago then cough congestion.  No specific fever. His wife is also tested positive today. Past history he has had previous COVID shots x3 and COVID infection x2 last infection up about early just over a year ago.  It was mild. This feels somewhat worse than that no chest pain reading difficulties.   ROS: See pertinent positives and negatives per HPI.  No heart symptoms.  Past Medical History:  Diagnosis Date   Anemia    Atrial fibrillation ([place]) 01/13/2012   CAD (coronary artery disease)    a. 2001 s/p CABGx5 (LIMA->LAD, VG->D1, VG->D2, VG->RPAV->RPDA; b. 02/2015 NSTEMI/PCI: VG->D2 95 (3.6x16 Promus DES). VG->D1 100; c. 06/2020 PCI: LM 75, LAD 100ost, D1 70, D2 70, LCX 47m RCA 100ost, RPAV 100, VG->RPAV->RPDA 80ost (4.5x12 Synergy DES), VG->D1 100, VG->D2 ok, LIMA->LAD ok; d. 07/2020 PCI LM/LCX (2.5x15 Resolute Onyx DES).   COPD (chronic obstructive pulmonary disease) (HCC)    ongoing tobacco use   GERD (gastroesophageal reflux disease)    HFimpEF (heart failure with improved ejection fraction) [place]    a. 12/2011 Echo: EF 35-40%; b. 06/2020 Echo: EF 55-60%   Ischemic cardiomyopathy 01/10/2012   a. 12/2011 Echo: EF 35-40%, mild conc LVH,  sev HK of anteroseptum, borderline RVH; b. 06/2020 Echo: EF 55-60%, no rwma, triv MR. Mild Ao  sclerosis.   NSTEMI (non-ST elevated myocardial infarction) ([facility] 01/10/2012   Associated with ventricular fibrillation   OSA (obstructive sleep apnea)    a. 07/2020 WatchPat1: 1. Moderate Obstructive Sleep Apnea with AHI 16.5/hr. O2 sat down to 80%.    Past Surgical History:  Procedure Laterality Date   [place]ARDIAC CATHETERIZATION  2011   [facility]   prox LAD occlusion, ostial RCA occlusion, 30% prox LCx stenosis, LIMA-LAD: 80% post-anastamosis lesion, SVG-Dx2: old occlusion w/ thrombus ([facility], SVG-Dx: patent, SVG-RCA: patent, diffuse irregs, 80-90% PDA/PLA lesions patent   CARDIAC CATHETERIZATION N/A 03/15/2015   Procedure: Left Heart Cath and Cors/Grafts Angiography;  Surgeon: CBurnell Blanks MD;  [facility];  Service: Cardiovascular;  Laterality: N/A;   CARDIAC CATHETERIZATION N/A 03/15/2015   Procedure: Coronary Stent Intervention;  Surgeon: CBurnell Blanks MD;  [facility];  Service: Cardiovascular;  Laterality: N/A;  svg to diagnoal 2   CORONARY ARTERY BYPASS GRAFT  2001   CORONARY STENT INTERVENTION N/A 07/10/2020   Procedure: CORONARY STENT INTERVENTION;  Surgeon: VJettie Booze MD;  [facility];  Service: Cardiovascular;  Laterality: N/A;   CORONARY STENT INTERVENTION N/A 07/26/2020   Procedure: CORONARY STENT INTERVENTION;  Surgeon: AWellington Hampshire MD;  [place] [facility];  Service: Cardiovascular;  Laterality: N/A;   INTRAVASCULAR IMAGING/OCT N/A 07/26/2020   Procedure: INTRAVASCULAR IMAGING/OCT;  Surgeon: AWellington Hampshire MD;  [facility];  Service: Cardiovascular;  Laterality: N/A;   INTRAVASCULAR ULTRASOUND/IVUS N/A 07/10/2020   Procedure: Intravascular Ultrasound/IVUS;  Surgeon: Jettie Booze, MD;  Location: [place] [facility] LAB;  Service: Cardiovascular;  Laterality: N/A;   LEFT HEART CATH AND CORS/GRAFTS  ANGIOGRAPHY N/A 07/10/2020   Procedure: LEFT HEART CATH AND CORS/GRAFTS ANGIOGRAPHY;  Surgeon: Jettie Booze, MD;  Location: [place] CV LAB;  Service: Cardiovascular;  Laterality: N/A;   LEFT HEART CATHETERIZATION WITH CORONARY ANGIOGRAM N/A 01/10/2012   Procedure: LEFT HEART CATHETERIZATION WITH CORONARY ANGIOGRAM;  Surgeon: Troy Sine, MD;  Location: [facility] C[facility] LAB;  Service: Cardiovascular;  Laterality: N/A;    Family History  Problem Relation Age of Onset   Hyperlipidemia Mother    Alcohol abuse Father    Hyperlipidemia Father    Heart disease Sister    Hyperlipidemia Sister    Heart disease Brother    Hyperlipidemia Brother    Hyperlipidemia Sister    Diabetes Neg Hx    Hypertension Neg Hx     Social History   Tobacco Use   Smoking status: Former    Packs/day: 1.00    Years: 45.00    Total pack years: 45.00    Types: Cigarettes    Quit date: 03/13/2015    Years since quitting: 6.6    Passive exposure: Current (as a child)   Smokeless tobacco: Never  Vaping Use   Vaping Use: Never used  Substance Use Topics   Alcohol use: Not Currently    Comment: really rare beer   Drug use: Yes    Types: Marijuana      Current Outpatient Medications:    aspirin 81 MG chewable tablet, Chew 1 tablet (81 mg total) by mouth daily., Disp: , Rfl:    atorvastatin (LIPITOR) 80 MG tablet, TAKE ONE (1) TABLET BY MOUTH EACH EVENING AT 6PM, Disp: 90 tablet, Rfl: 0   clonazePAM (KLONOPIN) 0.5 MG tablet, TAKE 1 TABLET BY MOUTH EVERY DAY AT BEDTIME AS NEEDED, Disp: 30 tablet, Rfl: 0   clopidogrel (PLAVIX) 75 MG tablet, TAKE ONE TABLET BY MOUTH EACH MORNING WITH BREAKFAST, Disp: 90 tablet, Rfl: 0   isosorbide mononitrate (IMDUR) 30 MG 24 hr tablet, TAKE 1 TABLET BY MOUTH EVERY DAY, Disp: 90 tablet, Rfl: 0   lisinopril (ZESTRIL) 5 MG tablet, TAKE 1/2 TABLET BY MOUTH DAILY, Disp: 45 tablet, Rfl: 3   nirmatrelvir/ritonavir EUA (PAXLOVID) 20 x 150 MG & 10 x 100MG TABS, Take 3 tablets by  mouth 2 (two) times daily for 5 days. (Take nirmatrelvir 150 mg two tablets twice daily for 5 days and ritonavir 100 mg one tablet twice daily for 5 days) Patient GFR is 87, Disp: 30 tablet, Rfl: 0   Omega-3 Fatty Acids (FISH OIL) 1000 MG CAPS, Take 1,000 mg by mouth in the morning and at bedtime., Disp: , Rfl:    pantoprazole (PROTONIX) 20 MG tablet, TAKE 1 TABLET BY MOUTH EVERY DAY AS NEEDED, Disp: 90 tablet, Rfl: 3  EXAM: BP Readings from Last 3 Encounters:  09/19/21 110/74  09/14/21 114/70  09/03/21 112/70    VITALS per patient if applicable:  GENERAL: alert, oriented, appears well and in no acute distress mild congestion minor cough during visit  HEENT: atraumatic, conjunttiva clear, no obvious abnormalities on inspection of external nose and ears  NECK: normal movements of the head and neck  LUNGS: on inspection no signs of respiratory distress, breathing rate appears normal, no obvious gross SOB, gasping  or wheezing  CV: no obvious cyanosis  MS: moves all visible extremities without noticeable abnormality  PSYCH/NEURO: pleasant and cooperative, no obvious depression or anxiety, speech and thought processing grossly intact Lab Results  Component Value Date   WBC 7.6 09/14/2021   HGB 12.6 (L) 09/14/2021   HCT 37.0 (L) 09/14/2021   PLT 155.0 09/14/2021   GLUCOSE 98 09/14/2021   CHOL 92 09/14/2021   TRIG 210.0 (H) 09/14/2021   HDL 31.30 (L) 09/14/2021   LDLDIRECT 37.0 09/14/2021   LDLCALC 42 07/08/2020   ALT 13 09/14/2021   AST 12 09/14/2021   NA 141 09/14/2021   K 4.1 09/14/2021   CL 109 09/14/2021   CREATININE 0.86 09/14/2021   BUN 16 09/14/2021   CO2 26 09/14/2021   TSH 2.26 06/07/2013   PSA 0.67 09/14/2021   INR 1.09 03/14/2015   HGBA1C 5.9 (H) 07/08/2020    ASSESSMENT AND PLAN:  Discussed the following assessment and plan:    ICD-10-CM   1. Respiratory tract infection due to COVID-19 virus  U07.1    J98.8    previous vaccination and hx of covid  infection x 2  nl egfr and has risk       Counseled.  Risk benefit of medication discussed. And possibility of  rebound sx . Also  Will proceed  with paxlovid   antiviral .  Expectant management and discussion of plan and treatment with opportunity to ask questions and all were answered. The patient agreed with the plan and demonstrated an understanding of the instructions.   Advised to call back or seek an in-person evaluation if worsening  or having  further concerns  in interim. Return if symptoms worsen or fail to improve as exxpected.    Shanon Ace, MD

## 2021-10-30 ENCOUNTER — Other Ambulatory Visit: Payer: Self-pay | Admitting: Cardiovascular Disease

## 2021-11-05 ENCOUNTER — Other Ambulatory Visit: Payer: Self-pay | Admitting: Nurse Practitioner

## 2021-11-05 NOTE — Telephone Encounter (Signed)
Good morning, I reached out to the patient this morning and spoke to him and informed him  [employer] [facility] has been trying to reach him. Patient states he doesn't answer unfamiliar numbers. I gave him the [phone] number to call referral support. Patient says he will call today.

## 2021-11-05 NOTE — Telephone Encounter (Signed)
From: [employer] [email or web address]> Sent: Friday, November 02, 2021 12:33 PM To: Melissa [email or web address]> Subject: PID: [id number] No contact   Good afternoon,  Dr. [name]'s patient below, Franklin Marks, Franklin Marks, (ID: [id number]), is unable to be contacted.  [employer] Intake Specialist, [employer]   image e: [email or web address] p: [phone] f: [phone] [email or web address]

## 2021-11-06 ENCOUNTER — Other Ambulatory Visit: Payer: Self-pay | Admitting: Medical

## 2021-11-07 ENCOUNTER — Other Ambulatory Visit: Payer: Self-pay | Admitting: Internal Medicine

## 2021-11-08 NOTE — Telephone Encounter (Signed)
Last filled 08-27-21 #30 Last OV 09-14-21 CPE Next OV 09-16-22 [facility]

## 2022-01-29 IMAGING — CT CT CHEST LUNG CANCER SCREENING LOW DOSE W/O CM
2 of 5 series · 15 of 40 positions shown, 18 images · non-contrast
Comparison: Low-dose lung cancer screening chest CT 08/03/2019.

CLINICAL DATA: 69-year-old former smoker (quit 4 years ago) with 45
pack-year history of smoking. Lung cancer screening examination.

EXAM:
CT CHEST WITHOUT CONTRAST LOW-DOSE FOR LUNG CANCER SCREENING
TECHNIQUE: Multidetector CT imaging of the chest was performed following the
standard protocol without IV contrast.

[Series 3: lung 1.00 · axial · 0.72mm/px · z∈[-1203,-908]mm · 12 of 327 slices shown, 15 images]
[im 16/327  mediastinal]
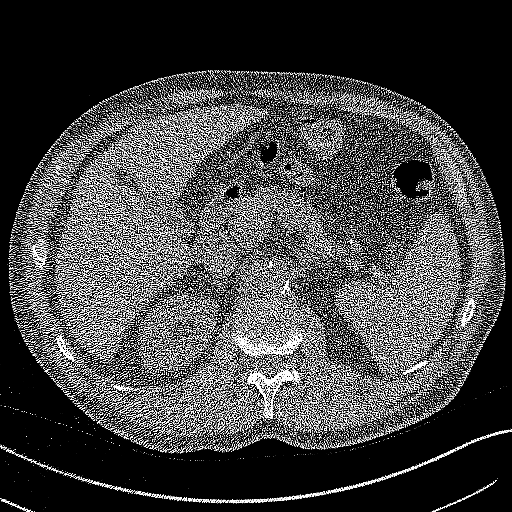
[im 16/327  lung]
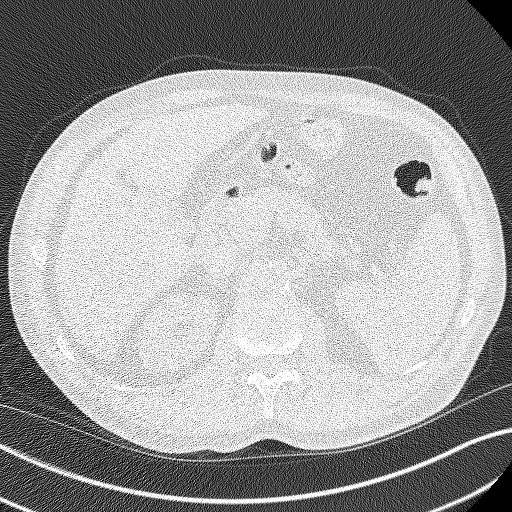
[im 47/327  lung]
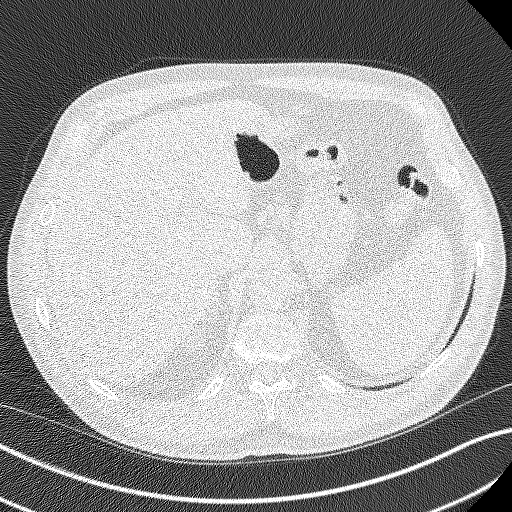
[im 78/327  lung]
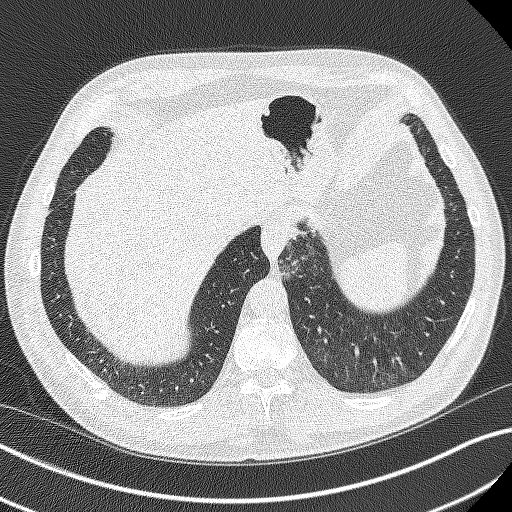
[im 94/327  lung]
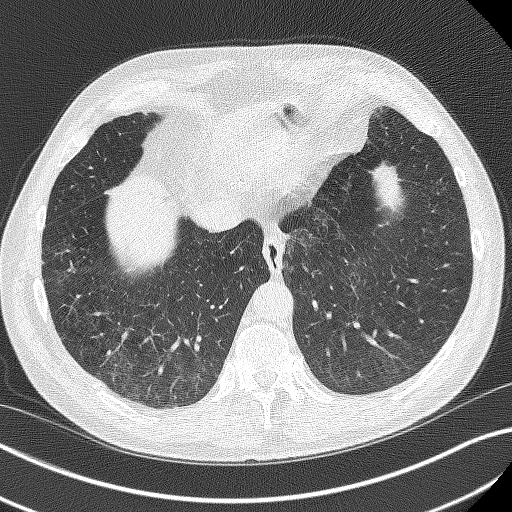
[im 125/327  mediastinal]
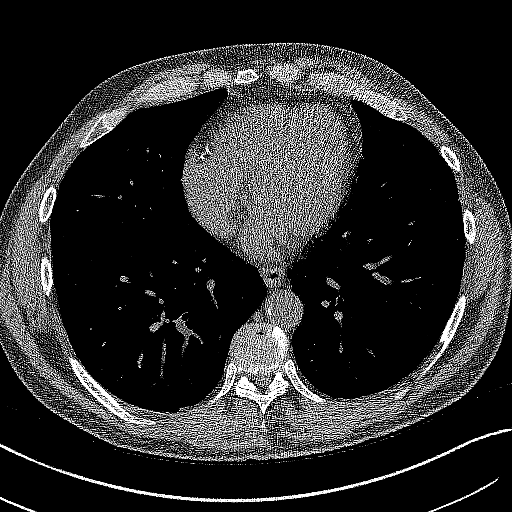
[im 125/327  lung]
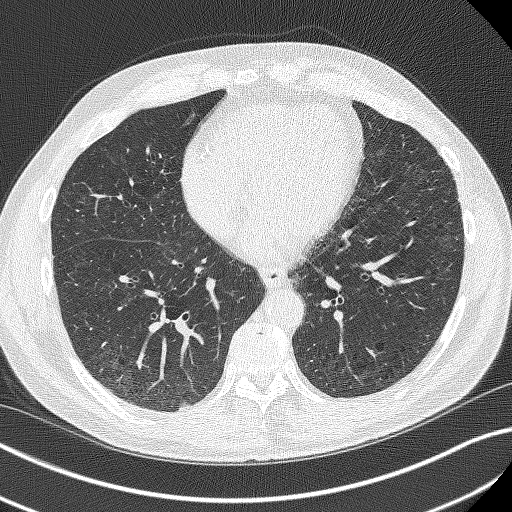
[im 156/327  lung]
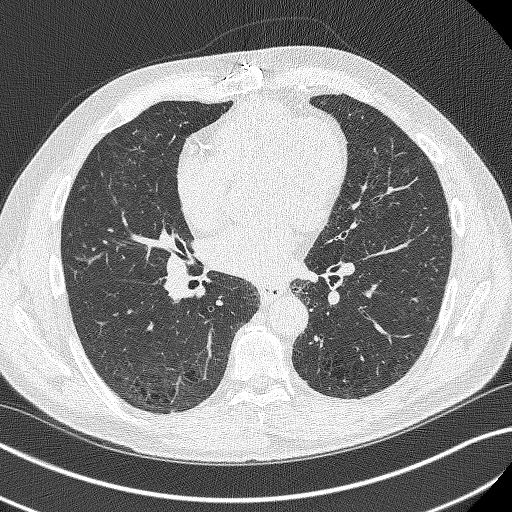
[im 171/327  lung]
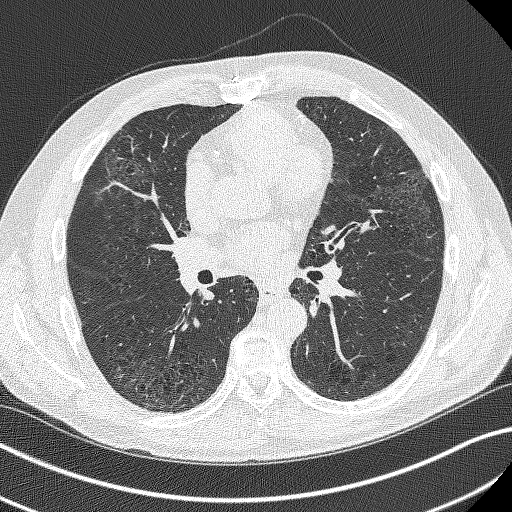
[im 202/327  lung]
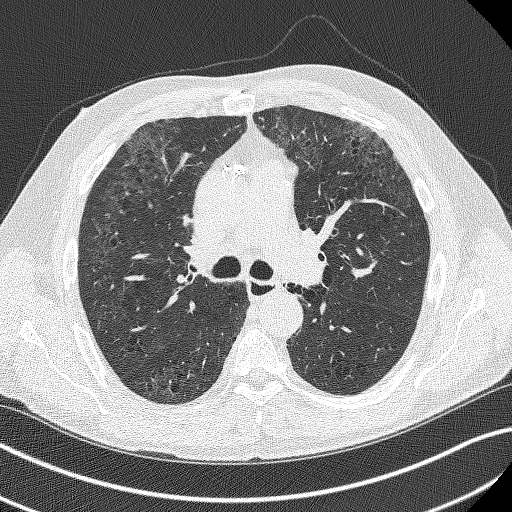
[im 233/327  mediastinal]
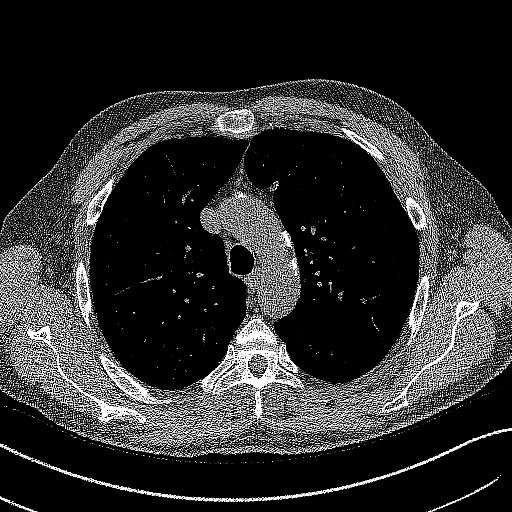
[im 233/327  lung]
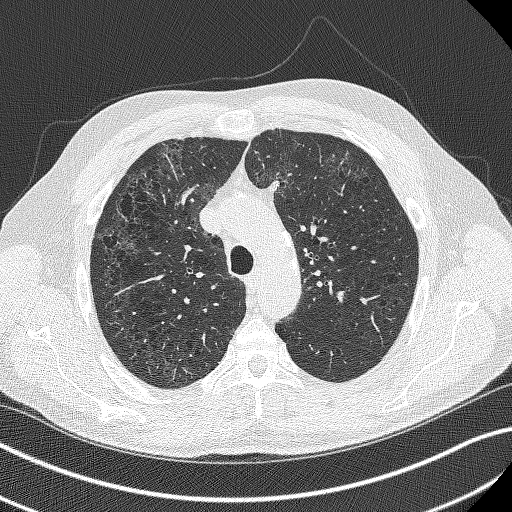
[im 249/327  lung]
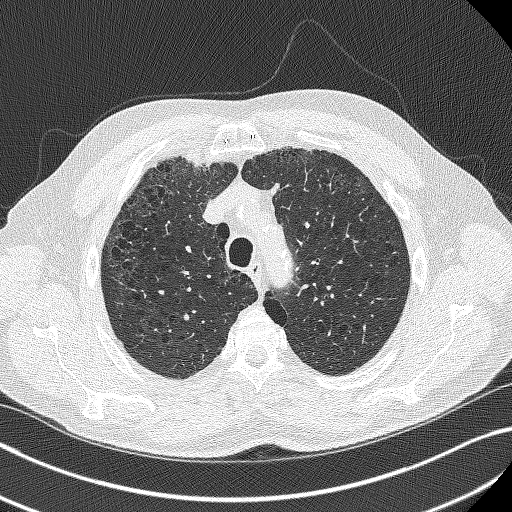
[im 280/327  lung]
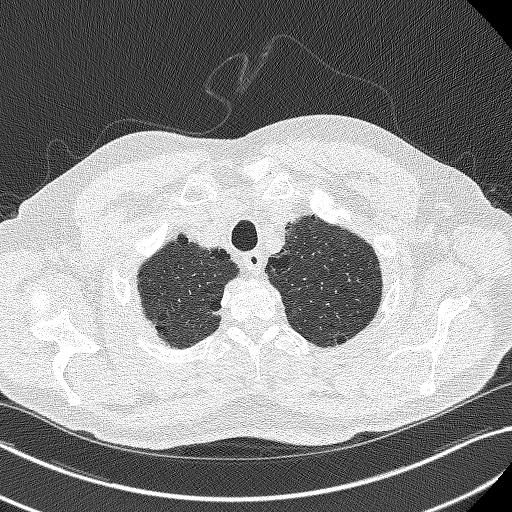
[im 311/327  lung]
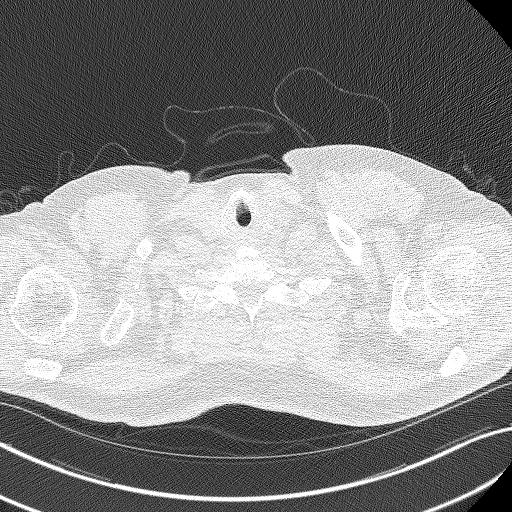

[Series 5: coronals lung 1.00 cor · coronal · 0.64mm/px · 3 of 311 slices shown]
[im 63/311  lung]
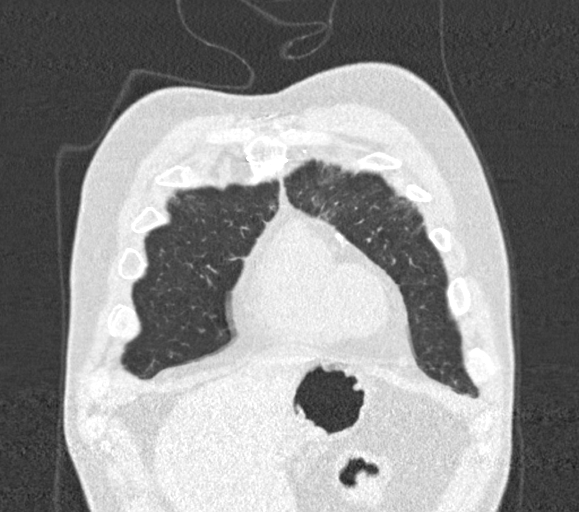
[im 125/311  lung]
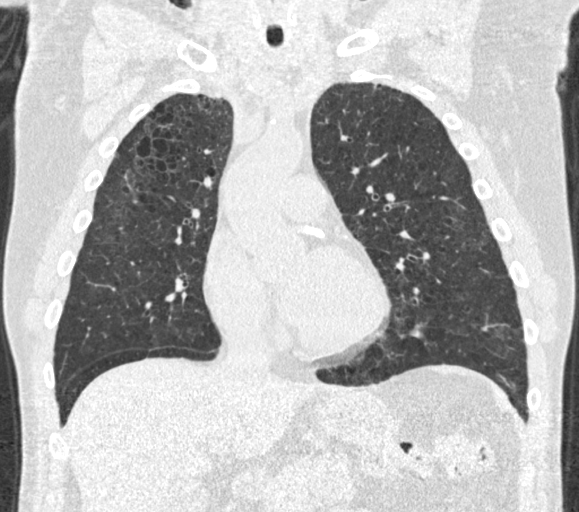
[im 187/311  lung]
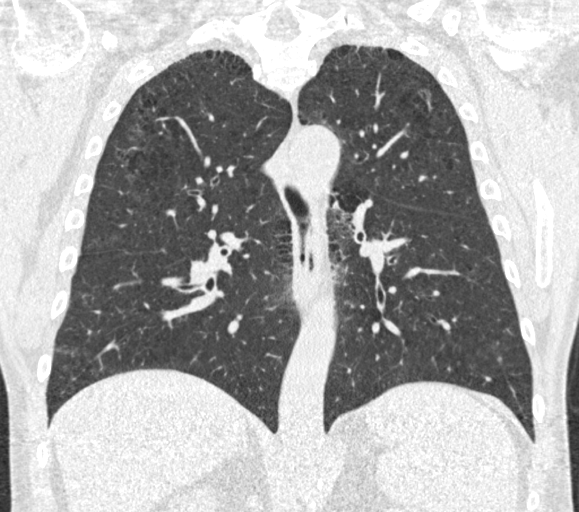

[15 of 40 positions shown; findings below may reference images not displayed]

FINDINGS: Cardiovascular: Heart size is normal. There is no significant
pericardial fluid, thickening or pericardial calcification. There is
aortic atherosclerosis, as well as atherosclerosis of the great
vessels of the mediastinum and the coronary arteries, including
calcified atherosclerotic plaque in the left main, left anterior
descending, left circumflex and right coronary arteries. Status post
median sternotomy for CABG including [REDACTED] to the LAD.

Mediastinum/Nodes: No pathologically enlarged mediastinal or hilar
lymph nodes. Please note that accurate exclusion of hilar adenopathy
is limited on noncontrast CT scans. Esophagus is unremarkable in
appearance. No axillary lymphadenopathy.

Lungs/Pleura: Small pulmonary nodule in the posterior aspect of the
right lower lobe (axial image 202 of series 3), with a volume
derived mean diameter of only 4.2 mm. No larger more suspicious
appearing pulmonary nodules or masses are noted. No acute
consolidative airspace disease. No pleural effusions. Diffuse
bronchial wall thickening with moderate centrilobular and paraseptal
emphysema.

Upper Abdomen: Aortic atherosclerosis. Tiny calcified gallstones
lying dependently in the gallbladder.

Musculoskeletal: Median sternotomy wires. Aspect of the right ninth
rib old minimally displaced fracture of the posterolateral with
probable fibrous union. There are no aggressive appearing lytic or
blastic lesions noted in the visualized portions of the skeleton.
IMPRESSION: 1. Lung-RADS 2, benign appearance or behavior. Continue annual
screening with low-dose chest CT without contrast in 12 months.
2. Aortic atherosclerosis, in addition to left main and 3 vessel
coronary artery disease. Status post median sternotomy for CABG
including [REDACTED] to the LAD.
3. Mild diffuse bronchial wall thickening with moderate
centrilobular and paraseptal emphysema; imaging findings suggestive
of underlying COPD.
4. Cholelithiasis.

Aortic Atherosclerosis ([id number]-[id number]) and Emphysema ([id number]-4F5.1).

## 2022-02-03 ENCOUNTER — Other Ambulatory Visit: Payer: Self-pay | Admitting: Internal Medicine

## 2022-02-03 ENCOUNTER — Other Ambulatory Visit: Payer: Self-pay | Admitting: Cardiology

## 2022-02-04 NOTE — Telephone Encounter (Signed)
Last filled 11-08-21 #30 Last OV 09-14-21 CPE Next OV 09-16-22 [facility]

## 2022-02-08 ENCOUNTER — Emergency Department (EMERGENCY_DEPARTMENT_HOSPITAL): Payer: PPO

## 2022-02-08 ENCOUNTER — Emergency Department (HOSPITAL_COMMUNITY): Payer: PPO

## 2022-02-08 ENCOUNTER — Emergency Department (HOSPITAL_COMMUNITY)
Admission: EM | Admit: 2022-02-08 | Discharge: 2022-02-08 | Disposition: A | Discharge status: Home / Self Care | Payer: PPO | Attending: Emergency Medicine | Admitting: Emergency Medicine

## 2022-02-08 ENCOUNTER — Encounter (HOSPITAL_COMMUNITY): Payer: Self-pay

## 2022-02-08 ENCOUNTER — Other Ambulatory Visit: Payer: Self-pay

## 2022-02-08 DIAGNOSIS — K8020 Calculus of gallbladder without cholecystitis without obstruction: Secondary | ICD-10-CM | POA: Diagnosis not present

## 2022-02-08 DIAGNOSIS — I739 Peripheral vascular disease, unspecified: Secondary | ICD-10-CM | POA: Insufficient documentation

## 2022-02-08 DIAGNOSIS — R252 Cramp and spasm: Secondary | ICD-10-CM | POA: Diagnosis not present

## 2022-02-08 DIAGNOSIS — R059 Cough, unspecified: Secondary | ICD-10-CM | POA: Diagnosis not present

## 2022-02-08 DIAGNOSIS — Z955 Presence of coronary angioplasty implant and graft: Secondary | ICD-10-CM | POA: Diagnosis not present

## 2022-02-08 DIAGNOSIS — R0989 Other specified symptoms and signs involving the circulatory and respiratory systems: Secondary | ICD-10-CM | POA: Diagnosis not present

## 2022-02-08 DIAGNOSIS — J439 Emphysema, unspecified: Secondary | ICD-10-CM | POA: Diagnosis not present

## 2022-02-08 DIAGNOSIS — I10 Essential (primary) hypertension: Secondary | ICD-10-CM | POA: Diagnosis not present

## 2022-02-08 DIAGNOSIS — I745 Embolism and thrombosis of iliac artery: Secondary | ICD-10-CM | POA: Diagnosis not present

## 2022-02-08 DIAGNOSIS — M79606 Pain in leg, unspecified: Secondary | ICD-10-CM | POA: Diagnosis present

## 2022-02-08 DIAGNOSIS — M79662 Pain in left lower leg: Secondary | ICD-10-CM | POA: Diagnosis not present

## 2022-02-08 LAB — CBC WITH DIFFERENTIAL/PLATELET
Abs Immature Granulocytes: 0.02 10*3/uL (ref 0.00–0.07)
Basophils Absolute: 0 10*3/uL (ref 0.0–0.1)
Basophils Relative: 0 %
Eosinophils Absolute: 0.3 10*3/uL (ref 0.0–0.5)
Eosinophils Relative: 4 %
HCT: 38.7 % — ABNORMAL LOW (ref 39.0–52.0)
Hemoglobin: 13 g/dL (ref 13.0–17.0)
Immature Granulocytes: 0 %
Lymphocytes Relative: 35 %
Lymphs Abs: 2.3 10*3/uL (ref 0.7–4.0)
MCH: 31 pg (ref 26.0–34.0)
MCHC: 33.6 g/dL (ref 30.0–36.0)
MCV: 92.1 fL (ref 80.0–100.0)
Monocytes Absolute: 0.7 10*3/uL (ref 0.1–1.0)
Monocytes Relative: 10 %
Neutro Abs: 3.3 10*3/uL (ref 1.7–7.7)
Neutrophils Relative %: 51 %
Platelets: 173 10*3/uL (ref 150–400)
RBC: 4.2 MIL/uL — ABNORMAL LOW (ref 4.22–5.81)
RDW: 13.7 % (ref 11.5–15.5)
WBC: 6.6 10*3/uL (ref 4.0–10.5)
nRBC: 0 % (ref 0.0–0.2)

## 2022-02-08 LAB — BASIC METABOLIC PANEL
Anion gap: 12 (ref 5–15)
BUN: 18 mg/dL (ref 8–23)
CO2: 20 mmol/L — ABNORMAL LOW (ref 22–32)
Calcium: 9.1 mg/dL (ref 8.9–10.3)
Chloride: 106 mmol/L (ref 98–111)
Creatinine, Ser: 0.93 mg/dL (ref 0.61–1.24)
GFR, Estimated: >60 mL/min (ref >60)
Glucose, Bld: 104 mg/dL — ABNORMAL HIGH (ref 70–99)
Potassium: 4.3 mmol/L (ref 3.5–5.1)
Sodium: 138 mmol/L (ref 135–145)

## 2022-02-08 LAB — MAGNESIUM: Magnesium: 2 mg/dL (ref 1.7–2.4)

## 2022-02-08 MED ORDER — IOHEXOL 350 MG/ML SOLN
100.0000 mL | Freq: Once | INTRAVENOUS | Status: AC | PRN
  Administered 2022-02-08: 100 mL via INTRAVENOUS

## 2022-02-08 NOTE — ED Provider Notes (Signed)
Assumed care of patient from off-going team. For more details, please see note from same day.  In brief, this is a 71 y.o. male with claudication symptoms who had blue/cold foot yesterday that has since warmed up.  ABI demonstrated significant disease on L and moderate on R.   Plan/Dispo at time of sign-out & ED Course since sign-out: [ ]  vascular  BP (!) 108/58   Pulse (!) 55   Temp 98.5 F (36.9 C) (Oral)   Resp 19   SpO2 94%    ED Course:   Clinical Course as of 02/08/22 2247  Fri Feb 08, 2022  1652 Vascular recommending CT with runoff [HN]  1852 Still awaiting CT with runoff [HN]  2136 Radiology: occlusion of L common iliac and BL superficial femoral arteries. Suspect chronic. [HN]  2243 Discussed with vascular who came and saw the patient and discussed his results.  They state that the occlusion is chronic and that he will be seen in clinic on Monday with them.  They state he is okay for discharge and patient is amenable to this plan.  Patient be discharged with discharge instructions and return precautions and vascular follow-up on Monday.  All questions answered to patient's satisfaction. [HN]    Clinical Course User Index [HN] Friday, MD    Dispo: DC ------------------------------- Loetta Rough, MD Emergency Medicine  This note was created using dictation software, which may contain spelling or grammatical errors.   Vivi Barrack, MD 02/08/22 [phone]

## 2022-02-08 NOTE — Discharge Instructions (Addendum)
Thank you for coming to [facility]. You were seen for leg pain. We did an exam, labs, and imaging, and these showed chronic occlusion of your iliac artery. The read is as below:  IMPRESSION: Advanced arterial atherosclerosis as detailed in findings. Of note there is complete occlusion of the left common iliac artery and bilateral superficial femoral arteries. No comparison is available though this is favored chronic given the degree of collateral flow through the profunda femoral artery distribution bilaterally. Two-vessel runoff of the right lower extremity and three-vessel runoff of the left lower extremity.   NON-VASCULAR Colonic diverticulosis without diverticulitis. Cholelithiasis.  You were evaluated by vascular surgery who will see you in clinic on Monday.  Please keep that appointment and see them on Monday.   Do not hesitate to return to the ED or call 911 if you experience: -Worsening symptoms -Cold, blue, numb leg or foot -Lightheadedness, passing out -Fevers/chills -Anything else that concerns you

## 2022-02-08 NOTE — ED Provider Notes (Signed)
[facility] EMERGENCY DEPARTMENT Provider Note   CSN: [id number] Arrival date & time: 02/08/22  1232     History  Chief Complaint  Patient presents with   Leg Pain    Franklin Marks is a 71 y.o. male.  Patient is a 71 year old male who presents with cramping in his legs.  He said that over the last few weeks he has had some increased cramping in both of his legs, mostly with walking.  He said that last night his left foot got ice cold and seemed to turn blue.  He put some warm towels on it and it seemed to get better after a while.  But that was the main reason he came in today.  No shortness of breath.  No chest pain.  No leg swelling.  He does not have a known history of peripheral arterial disease.  He does state his primary care doctor has said that his pulses in his feet are weak but he has not seen a vascular surgeon.       Home Medications Prior to Admission medications   Medication Sig Start Date End Date Taking? Authorizing Provider  aspirin 81 MG chewable tablet Chew 1 tablet (81 mg total) by mouth daily. 07/26/20   Arty Baumgartner, NP  atorvastatin (LIPITOR) 80 MG tablet TAKE ONE (1) TABLET BY MOUTH [facility] EVENING AT 6PM 11/06/21   Furth, Cadence H, PA-C  clonazePAM (KLONOPIN) 0.5 MG tablet TAKE 1 TABLET BY MOUTH EVERY DAY AT BEDTIME AS NEEDED 02/04/22   [name], MD  clopidogrel (PLAVIX) 75 MG tablet TAKE ONE TABLET BY MOUTH EACH MORNING WITH BREAKFAST 02/04/22   Charlsie Quest, NP  isosorbide mononitrate (IMDUR) 30 MG 24 hr tablet TAKE 1 TABLET BY MOUTH EVERY DAY 10/30/21   Iran Ouch, MD  lisinopril (ZESTRIL) 5 MG tablet TAKE 1/2 TABLET BY MOUTH EVERY DAY 02/04/22   [name], MD  Omega-3 Fatty Acids (FISH OIL) 1000 MG CAPS Take 1,000 mg by mouth in the morning and at bedtime.    [provider]  pantoprazole (PROTONIX) 20 MG tablet TAKE 1 TABLET BY MOUTH EVERY DAY AS NEEDED 08/30/21   [name], MD       Allergies    Codeine    Review of Systems   Review of Systems  Constitutional:  Negative for chills, diaphoresis, fatigue and fever.  HENT:  Negative for congestion, rhinorrhea and sneezing.   Eyes: Negative.   Respiratory:  Negative for cough, chest tightness and shortness of breath.   Cardiovascular:  Negative for chest pain and leg swelling.  Gastrointestinal:  Negative for abdominal pain, blood in stool, diarrhea, nausea and vomiting.  Genitourinary:  Negative for difficulty urinating, flank pain, frequency and hematuria.  Musculoskeletal:  Positive for myalgias. Negative for arthralgias and back pain.  Skin:  Negative for rash.  Neurological:  Negative for dizziness, speech difficulty, weakness, numbness and headaches.    Physical Exam Updated Vital Signs BP 104/71   Pulse 61   Temp 97.7 F (36.5 C)   Resp (!) 26   SpO2 98%  Physical Exam Constitutional:      Appearance: He is well-developed.  HENT:     Head: Normocephalic and atraumatic.  Eyes:     Pupils: Pupils are equal, round, and reactive to light.  Cardiovascular:     Rate and Rhythm: Normal rate and regular rhythm.     Heart sounds: Normal heart sounds.  Pulmonary:  Effort: Pulmonary effort is normal. No respiratory distress.     Breath sounds: Normal breath sounds. No wheezing or rales.  Chest:     Chest wall: No tenderness.  Abdominal:     General: Bowel sounds are normal.     Palpations: Abdomen is soft.     Tenderness: There is no abdominal tenderness. There is no guarding or rebound.  Musculoskeletal:        General: Normal range of motion.     Cervical back: Normal range of motion and neck supple.     Comments: Feet are warm bilaterally.  They are not cyanotic.  I can palpate a posterior tibial pulse in the right foot.  I cannot palpate pulses in the left foot although the foot is warm and normal color.  No swelling of the legs.  Lymphadenopathy:     Cervical: No cervical adenopathy.   Skin:    General: Skin is warm and dry.     Findings: No rash.  Neurological:     Mental Status: He is alert and oriented to person, place, and time.     ED Results / Procedures / Treatments   Labs (all labs ordered are listed, but only abnormal results are displayed) Labs Reviewed  CBC WITH DIFFERENTIAL/PLATELET - Abnormal; Notable for the following components:      Result Value   RBC 4.20 (*)    HCT 38.7 (*)    All other components within normal limits  BASIC METABOLIC PANEL - Abnormal; Notable for the following components:   CO2 20 (*)    Glucose, Bld 104 (*)    All other components within normal limits  MAGNESIUM    EKG EKG Interpretation  Date/Time:  Friday February 08 2022 16:17:55 EST Ventricular Rate:  57 PR Interval:  317 QRS Duration: 143 QT Interval:  466 QTC Calculation: 454 R Axis:   68 Text Interpretation: Sinus rhythm Prolonged PR interval Right bundle branch block since last tracing no significant change Confirmed by Rolan Bucco [phone]) on 02/08/2022 4:24:12 PM  Radiology VAS [place] ABI WITH/WO TBI  Result Date: 02/08/2022  LOWER EXTREMITY DOPPLER STUDY Patient Name:  Franklin Marks  Date of Exam:   02/08/2022 Medical Rec #: [id number]      Accession #:    [id number] Date of Birth: 03-03-1951     Patient Gender: M Patient Age:   56 years Exam Location:  [facility] Procedure:      VAS [place] ABI WITH/WO TBI Referring Phys: Renne Crigler --------------------------------------------------------------------------------  Indications: Patient endorses worsening hip pain and claudication x2 months.              Left worse than right. Noted left extremity to be cold, painful,              and numb last night. High Risk Factors: Hyperlipidemia, past history of smoking, prior MI, coronary                    artery disease. Other Factors: Known bilateral SFA occlusions with reconstitution via                collaterals. History of mild to moderate peripheral arterial                 disease affecting right more than left. Monophasic waveforms                noted to the left common femoral artery on venous duplex exam.  Comparison Study: 06-06-2016 Prior ABI w/ TBI was 0.87/0.63 on the right and                   0.98/0.76 Performing Technologist: Jean Rosenthal RDMS RVT  Examination Guidelines: A complete evaluation includes at minimum, Doppler waveform signals and systolic blood pressure reading at the level of bilateral brachial, anterior tibial, and posterior tibial arteries, when vessel segments are accessible. Bilateral testing is considered an integral part of a complete examination. Photoelectric Plethysmograph (PPG) waveforms and toe systolic pressure readings are included as required and additional duplex testing as needed. Limited examinations for reoccurring indications may be performed as noted.  ABI Findings: +---------+------------------+-----+----------+--------+ Right    Rt Pressure (mmHg)IndexWaveform  Comment  +---------+------------------+-----+----------+--------+ Brachial 110                    triphasic          +---------+------------------+-----+----------+--------+ PTA      74                0.67 biphasic           +---------+------------------+-----+----------+--------+ DP       65                0.59 monophasic         +---------+------------------+-----+----------+--------+ Great Toe49                0.45 Abnormal           +---------+------------------+-----+----------+--------+ +---------+------------------+-----+-------------------+-------+ Left     Lt Pressure (mmHg)IndexWaveform           Comment +---------+------------------+-----+-------------------+-------+ Brachial 106                    triphasic                  +---------+------------------+-----+-------------------+-------+ PTA      31                0.28 monophasic                  +---------+------------------+-----+-------------------+-------+ DP       48                0.44 dampened monophasic        +---------+------------------+-----+-------------------+-------+ Great Toe15                0.14 Abnormal                   +---------+------------------+-----+-------------------+-------+ +-------+-----------+-----------+------------+------------+ ABI/TBIToday's ABIToday's TBIPrevious ABIPrevious TBI +-------+-----------+-----------+------------+------------+ Right  0.67       0.45       0.87        0.63         +-------+-----------+-----------+------------+------------+ Left   0.44       0.14       0.98        0.76         +-------+-----------+-----------+------------+------------+ Right ABIs appear decreased compared to prior study on 06-06-2016. Left ABIs appear significantly decreased compared to prior study on 06-06-2016.  Summary: Right: Resting right ankle-brachial index indicates moderate right lower extremity arterial disease. The right toe-brachial index is abnormal. Left: Resting left ankle-brachial index indicates severe left lower extremity arterial disease. The left toe-brachial index is abnormal. *See table(s) above for measurements and observations.     Preliminary    VAS [place] LOWER EXTREMITY VENOUS (DVT) (ONLY [facility] & [facility])  Result Date: 02/08/2022  Lower Venous  DVT Study Patient Name:  Franklin Marks  Date of Exam:   02/08/2022 Medical Rec #: [id number]      Accession #:    [id number] Date of Birth: [birth year]     Patient Gender: M Patient Age:   60 years Exam Location:  [facility] Procedure:      VAS [place] LOWER EXTREMITY VENOUS (DVT) Referring Phys: Ivin Booty GEIPLE --------------------------------------------------------------------------------  Indications: Calf pain, exacerbated with ambulation.  Comparison Study: No prior studies. Performing Technologist: Jean Rosenthal RDMS, RVT  Examination Guidelines: A complete evaluation includes B-mode  imaging, spectral Doppler, color Doppler, and power Doppler as needed of all accessible portions of each vessel. Bilateral testing is considered an integral part of a complete examination. Limited examinations for reoccurring indications may be performed as noted. The reflux portion of the exam is performed with the patient in reverse Trendelenburg.  +-----+---------------+---------+-----------+----------+--------------+ RIGHTCompressibilityPhasicitySpontaneityPropertiesThrombus Aging +-----+---------------+---------+-----------+----------+--------------+ CFV  Full           Yes      Yes                                 +-----+---------------+---------+-----------+----------+--------------+   +---------+---------------+---------+-----------+----------+--------------+ LEFT     CompressibilityPhasicitySpontaneityPropertiesThrombus Aging +---------+---------------+---------+-----------+----------+--------------+ CFV      Full           Yes      Yes                                 +---------+---------------+---------+-----------+----------+--------------+ SFJ      Full                                                        +---------+---------------+---------+-----------+----------+--------------+ FV Prox  Full                                                        +---------+---------------+---------+-----------+----------+--------------+ FV Mid   Full                                                        +---------+---------------+---------+-----------+----------+--------------+ FV DistalFull                                                        +---------+---------------+---------+-----------+----------+--------------+ PFV      Full                                                        +---------+---------------+---------+-----------+----------+--------------+ POP      Full           Yes  Yes                                  +---------+---------------+---------+-----------+----------+--------------+ PTV      Full                                                        +---------+---------------+---------+-----------+----------+--------------+ PERO     Full                                                        +---------+---------------+---------+-----------+----------+--------------+ Gastroc  Full                                                        +---------+---------------+---------+-----------+----------+--------------+     Summary: RIGHT: - No evidence of common femoral vein obstruction.  LEFT: - There is no evidence of deep vein thrombosis in the lower extremity.  - No cystic structure found in the popliteal fossa.  - Incidental: In setting of hip pain, cold/numb extremity, and history of PAD, the common femoral artery was observed and demonstrates dampened monophasic flow. Known SFA occlusion was observed with reconsitution via collaterals and three vessel run-off.  *See table(s) above for measurements and observations.    Preliminary    DG Chest 2 View  Result Date: 02/08/2022 CLINICAL DATA:  Cough. EXAM: CHEST - 2 VIEW COMPARISON:  07/07/2020 FINDINGS: The cardiac silhouette, mediastinal and hilar contours are within normal limits. Stable tortuosity and calcification of the thoracic aorta. Stable surgical changes from triple bypass surgery Underlying emphysematous changes and pulmonary scarring but no acute overlying pulmonary process. No pleural effusions or pulmonary lesions. IMPRESSION: Emphysematous changes and pulmonary scarring but no acute overlying pulmonary process. Electronically Signed   By: Rudie Meyer M.D.   On: 02/08/2022 13:11    Procedures Procedures    Medications Ordered in ED Medications - No data to display  ED Course/ Medical Decision Making/ A&P                           Medical Decision Making  Patient is a 71 year old who presents with worsening claudication  type symptoms.  He had what sounded like a cyanotic cold foot last night but that had resolved.  On exam I cannot palpate pulses in the left foot but clinically does not have an acute ischemic foot.  It is warm and similar in color to the other side.  He did have arterial ultrasounds which showed severe arterial disease on the left and moderate on the right.  I did consult with Dr. Myra Gianotti with vascular surgery who will come evaluate the patient.  His EKG shows a sinus rhythm.  Labs are nonconcerning.  He had chest x-ray which shows no evidence of pulmonary edema.  No pneumonia.  This was interpreted by me and confirmed by the radiologist.  Patient care turned over to Dr. Jearld Fenton pending vascular surgery  consult.  Final Clinical Impression(s) / ED Diagnoses Final diagnoses:  Claudication [facility](HCC)    Rx / DC Orders ED Discharge Orders     None         Rolan BuccoBelfi, Dellie Piasecki, MD 02/08/22 1640

## 2022-02-08 NOTE — ED Provider Triage Note (Signed)
Emergency Medicine Provider Triage Evaluation Note  Franklin Marks, a 71 y.o. male was evaluated in triage.  Patient with history of CABG.  Pt complains of left lower extremity pain over the past several days.  States that his foot felt "ice cold" yesterday with severe pain.  Also reports a cough.  History of stents in the heart, no stents in the leg.  Review of Systems  Positive: Left lower extremity pain Negative: Fever, chest pain  Physical Exam  BP (!) 146/86 (BP Location: Right Arm)   Pulse 66   Temp 97.7 F (36.5 C)   Resp 18   SpO2 97%  Gen:   Awake, no distress   Resp:  Normal effort  MSK:   Moves extremities without difficulty  Other:  Left lower extremity appears well-perfused, capillary refill 2 seconds, mild left calf tenderness to palpation  Medical Decision Making  Medically screening exam initiated at 12:35 PM.  Appropriate orders placed.  [name] was informed that the remainder of the evaluation will be completed by another provider, this initial triage assessment does not replace that evaluation, and the importance of remaining in the ED until their evaluation is complete.     Renne Crigler, PA-C 02/08/22 1242

## 2022-02-08 NOTE — ED Triage Notes (Signed)
Pt arrived POV from home c/o cramping in his left leg for a couple days but then last night he noticed his foot was ice cold and feeling numb. Pt states the pain is running down his leg from his hip.

## 2022-02-09 NOTE — Consult Note (Signed)
[facility] [place]  Patient name: Franklin Marks MRN: [id number] DOB: 1950/09/05 Sex: male   REQUESTING PROVIDER:   ER   REASON FOR CONSULT:    Left leg pain  HISTORY OF PRESENT ILLNESS:   Franklin Marks is a 71 y.o. male, who presented to the emergency department with pain in his left leg which has been going on for several months.  He was worried that he had hip issues.  He states he can walk approximately 50 feet before he has to stop because of pain in his right hip and thigh as well as calfs.  Resting makes his pain go away.  Last night he felt that his left foot got cold and seem to turn blue.  This is what prompted him to come to the emergency department.  He does not have any pain at rest.  The patient has a history of coronary artery disease, status post CABG.  He suffers from COPD secondary to tobacco abuse.  He takes a statin for hypercholesterolemia.  He is medically managed for hypertension with an ACE inhibitor.  He is on dual antiplatelet therapy with aspirin and Plavix.  PAST MEDICAL HISTORY    Past Medical History:  Diagnosis Date   Anemia    Atrial fibrillation (HCC) 01/13/2012   CAD (coronary artery disease)    a. 2001 s/p CABGx5 (LIMA->LAD, VG->D1, VG->D2, VG->RPAV->RPDA; b. 02/2015 NSTEMI/PCI: VG->D2 95 (3.6x16 Promus DES). VG->D1 100; c. 06/2020 PCI: LM 75, LAD 100ost, D1 70, D2 70, LCX 11m, RCA 100ost, RPAV 100, VG->RPAV->RPDA 80ost (4.5x12 Synergy DES), VG->D1 100, VG->D2 ok, LIMA->LAD ok; d. 07/2020 PCI LM/LCX (2.5x15 Resolute Onyx DES).   COPD (chronic obstructive pulmonary disease) (HCC)    ongoing tobacco use   GERD (gastroesophageal reflux disease)    HFimpEF (heart failure with improved ejection fraction) (HCC)    a. 12/2011 Echo: EF 35-40%; b. 06/2020 Echo: EF 55-60%   Ischemic cardiomyopathy 01/10/2012   a. 12/2011 Echo: EF 35-40%, mild conc LVH,  sev HK of anteroseptum, borderline RVH; b. 06/2020  Echo: EF 55-60%, no rwma, triv MR. Mild Ao sclerosis.   NSTEMI (non-ST elevated myocardial infarction) (HCC) 01/10/2012   Associated with ventricular fibrillation   OSA (obstructive sleep apnea)    a. 07/2020 WatchPat1: 1. Moderate Obstructive Sleep Apnea with AHI 16.5/hr. O2 sat down to 80%.     FAMILY HISTORY   Family History  Problem Relation Age of Onset   Hyperlipidemia Mother    Alcohol abuse Father    Hyperlipidemia Father    Heart disease Sister    Hyperlipidemia Sister    Heart disease Brother    Hyperlipidemia Brother    Hyperlipidemia Sister    Diabetes Neg Hx    Hypertension Neg Hx     SOCIAL HISTORY:   Social History   Socioeconomic History   Marital status: Married    Spouse name: Not on file   Number of children: 4   Years of education: Not on file   Highest education level: Not on file  Occupational History   Occupation: Management --now disabled   Occupation: Holiday representative work    Comment: Part--time  Tobacco Use   Smoking status: Former    Packs/day: 1.00    Years: 45.00    Total pack years: 45.00    Types: Cigarettes    Quit date: 03/13/2015    Years since quitting: 6.9    Passive exposure: Current (as a child)  Smokeless tobacco: Never  Vaping Use   Vaping Use: Never used  Substance and Sexual Activity   Alcohol use: Not Currently    Comment: really rare beer   Drug use: Yes    Types: Marijuana   Sexual activity: Not Currently  Other Topics Concern   Not on file  Social History Narrative   Married twice   3 children from 1st marriage-- 1 from second   Worked as Investment banker, operational      Has living will   Wife has health care POA---alternate would be daughter Judeth Cornfield   Would accept resuscitation--- but no prolonged ventilation   No tube feeds if cognitively unaware   Social Determinants of Health   Financial Resource Strain: Not on file  Food Insecurity: Not on file  Transportation Needs: Not on file  Physical Activity: Not  on file  Stress: Not on file  Social Connections: Not on file  Intimate Partner Violence: Not on file    ALLERGIES:    Allergies  Allergen Reactions   Codeine Nausea And Vomiting    CURRENT MEDICATIONS:    No current facility-administered medications for this encounter.   Current Outpatient Medications  Medication Sig Dispense Refill   aspirin 81 MG chewable tablet Chew 1 tablet (81 mg total) by mouth daily.     atorvastatin (LIPITOR) 80 MG tablet TAKE ONE (1) TABLET BY MOUTH EACH EVENING AT 6PM 90 tablet 3   clonazePAM (KLONOPIN) 0.5 MG tablet TAKE 1 TABLET BY MOUTH EVERY DAY AT BEDTIME AS NEEDED 30 tablet 0   clopidogrel (PLAVIX) 75 MG tablet TAKE ONE TABLET BY MOUTH EACH MORNING WITH BREAKFAST 90 tablet 0   isosorbide mononitrate (IMDUR) 30 MG 24 hr tablet TAKE 1 TABLET BY MOUTH EVERY DAY 90 tablet 3   lisinopril (ZESTRIL) 5 MG tablet TAKE 1/2 TABLET BY MOUTH EVERY DAY 45 tablet 3   Omega-3 Fatty Acids (FISH OIL) 1000 MG CAPS Take 1,000 mg by mouth in the morning and at bedtime.     pantoprazole (PROTONIX) 20 MG tablet TAKE 1 TABLET BY MOUTH EVERY DAY AS NEEDED 90 tablet 3    REVIEW OF SYSTEMS:   [X]  denotes positive finding, [ ]  denotes negative finding Cardiac  Comments:  Chest pain or chest pressure:    Shortness of breath upon exertion:    Short of breath when lying flat:    Irregular heart rhythm:        Vascular    Pain in calf, thigh, or hip brought on by ambulation: x   Pain in feet at night that wakes you up from your sleep:     Blood clot in your veins:    Leg swelling:         Pulmonary    Oxygen at home:    Productive cough:     Wheezing:         Neurologic    Sudden weakness in arms or legs:     Sudden numbness in arms or legs:     Sudden onset of difficulty speaking or slurred speech:    Temporary loss of vision in one eye:     Problems with dizziness:         Gastrointestinal    Blood in stool:      Vomited blood:         Genitourinary     Burning when urinating:     Blood in urine:        Psychiatric  Major depression:         Hematologic    Bleeding problems:    Problems with blood clotting too easily:        Skin    Rashes or ulcers:        Constitutional    Fever or chills:     PHYSICAL EXAM:   Vitals:   02/08/22 2015 02/08/22 2100 02/08/22 2130 02/08/22 2318  BP: 130/76 122/74 (!) 108/58 (!) 108/58  Pulse: (!) 56 (!) 59 (!) 55 (!) 55  Resp: 17 20 19 18   Temp:    97.8 F (36.6 C)  TempSrc:    Oral  SpO2: 97% 94% 94% 95%    GENERAL: The patient is a well-nourished male, in no acute distress. The vital signs are documented above. CARDIAC: There is a regular rate and rhythm.  VASCULAR: Palpable right femoral pulse, nonpalpable left.  Pedal pulses are nonpalpable PULMONARY: Nonlabored respirations ABDOMEN: Soft and non-tender with normal pitched bowel sounds.  MUSCULOSKELETAL: There are no major deformities or cyanosis. NEUROLOGIC: Motor and sensory function are intact in both feet SKIN: There are no ulcers or rashes noted. PSYCHIATRIC: The patient has a normal affect.  STUDIES:   I have reviewed the following studies: CT angiogram: VASCULAR   Advanced arterial atherosclerosis as detailed in findings. Of note there is complete occlusion of the left common iliac artery and bilateral superficial femoral arteries. No comparison is available though this is favored chronic given the degree of collateral flow through the profunda femoral artery distribution bilaterally.   Two-vessel runoff of the right lower extremity and three-vessel runoff of the left lower extremity.   NON-VASCULAR   Colonic diverticulosis without diverticulitis.   Cholelithiasis. Venous Dopplers: Negative for DVT Arterial Dopplers: +-------+-----------+-----------+------------+------------+  ABI/TBIToday's ABIToday's TBIPrevious ABIPrevious TBI  +-------+-----------+-----------+------------+------------+  Right  0.67       0.45       0.87        0.63          +-------+-----------+-----------+------------+------------+  Left  0.44       0.14       0.98        0.76          +-------+-----------+-----------+------------+------------+    ASSESSMENT and PLAN   Bilateral claudication, left greater than right: The patient is imaging revealed a left common iliac occlusion which I think explains his left hip and leg pain.  The neck step will be angiography to define his anatomy, and attempt to recanalize his occluded common iliac artery.  The patient would like to go home.  Therefore, I will get this arranged for next week.  All questions answered.   , MD, FACS [facility] [phone] Pager [phone]

## 2022-02-11 ENCOUNTER — Other Ambulatory Visit: Payer: Self-pay

## 2022-02-11 DIAGNOSIS — I70222 Atherosclerosis of native arteries of extremities with rest pain, left leg: Secondary | ICD-10-CM

## 2022-02-13 ENCOUNTER — Encounter (HOSPITAL_COMMUNITY)
Admission: RE | Disposition: A | Discharge status: Home / Self Care | Payer: Self-pay | Source: Ambulatory Visit | Attending: Vascular Surgery

## 2022-02-13 ENCOUNTER — Other Ambulatory Visit: Payer: Self-pay

## 2022-02-13 ENCOUNTER — Ambulatory Visit (HOSPITAL_COMMUNITY)
Admission: RE | Admit: 2022-02-13 | Discharge: 2022-02-13 | Disposition: A | Discharge status: Home / Self Care | Payer: PPO | Source: Ambulatory Visit | Attending: Vascular Surgery | Admitting: Vascular Surgery

## 2022-02-13 DIAGNOSIS — Z7902 Long term (current) use of antithrombotics/antiplatelets: Secondary | ICD-10-CM | POA: Insufficient documentation

## 2022-02-13 DIAGNOSIS — I70212 Atherosclerosis of native arteries of extremities with intermittent claudication, left leg: Secondary | ICD-10-CM | POA: Diagnosis not present

## 2022-02-13 DIAGNOSIS — J449 Chronic obstructive pulmonary disease, unspecified: Secondary | ICD-10-CM | POA: Diagnosis not present

## 2022-02-13 DIAGNOSIS — I2510 Atherosclerotic heart disease of native coronary artery without angina pectoris: Secondary | ICD-10-CM | POA: Insufficient documentation

## 2022-02-13 DIAGNOSIS — I70201 Unspecified atherosclerosis of native arteries of extremities, right leg: Secondary | ICD-10-CM | POA: Diagnosis not present

## 2022-02-13 DIAGNOSIS — I70213 Atherosclerosis of native arteries of extremities with intermittent claudication, bilateral legs: Secondary | ICD-10-CM | POA: Diagnosis not present

## 2022-02-13 DIAGNOSIS — Z951 Presence of aortocoronary bypass graft: Secondary | ICD-10-CM | POA: Insufficient documentation

## 2022-02-13 DIAGNOSIS — Z7982 Long term (current) use of aspirin: Secondary | ICD-10-CM | POA: Insufficient documentation

## 2022-02-13 DIAGNOSIS — I10 Essential (primary) hypertension: Secondary | ICD-10-CM | POA: Insufficient documentation

## 2022-02-13 DIAGNOSIS — Z87891 Personal history of nicotine dependence: Secondary | ICD-10-CM | POA: Diagnosis not present

## 2022-02-13 DIAGNOSIS — E7800 Pure hypercholesterolemia, unspecified: Secondary | ICD-10-CM | POA: Insufficient documentation

## 2022-02-13 HISTORY — PX: ABDOMINAL AORTOGRAM W/LOWER EXTREMITY: CATH118223

## 2022-02-13 SURGERY — ABDOMINAL AORTOGRAM W/LOWER EXTREMITY
Anesthesia: LOCAL

## 2022-02-13 MED ORDER — SODIUM CHLORIDE 0.9% FLUSH: 3.0000 mL | Freq: Two times a day (BID) | INTRAVENOUS | Status: DC

## 2022-02-13 MED ORDER — LABETALOL HCL 5 MG/ML IV SOLN: 10.0000 mg | INTRAVENOUS | Status: DC | PRN

## 2022-02-13 MED ORDER — HEPARIN (PORCINE) IN NACL 1000-0.9 UT/500ML-% IV SOLN
INTRAVENOUS | Status: DC | PRN
  Administered 2022-02-13 (×2): 500 mL

## 2022-02-13 MED ORDER — HYDRALAZINE HCL 20 MG/ML IJ SOLN: 5.0000 mg | INTRAMUSCULAR | Status: DC | PRN

## 2022-02-13 MED ORDER — SODIUM CHLORIDE 0.9 % IV SOLN: 250.0000 mL | INTRAVENOUS | Status: DC | PRN

## 2022-02-13 MED ORDER — MIDAZOLAM HCL 2 MG/2ML IJ SOLN
INTRAMUSCULAR | Status: DC | PRN
  Administered 2022-02-13: 1 mg via INTRAVENOUS

## 2022-02-13 MED ORDER — FENTANYL CITRATE (PF) 100 MCG/2ML IJ SOLN
INTRAMUSCULAR | Status: DC | PRN
  Administered 2022-02-13 (×2): 50 ug via INTRAVENOUS

## 2022-02-13 MED ORDER — HEPARIN (PORCINE) IN NACL 1000-0.9 UT/500ML-% IV SOLN
INTRAVENOUS | Status: AC
  Filled 2022-02-13: qty 1000

## 2022-02-13 MED ORDER — IODIXANOL 320 MG/ML IV SOLN
INTRAVENOUS | Status: DC | PRN
  Administered 2022-02-13: 180 mL via INTRA_ARTERIAL

## 2022-02-13 MED ORDER — SODIUM CHLORIDE 0.9 % IV SOLN: INTRAVENOUS | Status: DC

## 2022-02-13 MED ORDER — ACETAMINOPHEN 325 MG PO TABS: 650.0000 mg | ORAL_TABLET | ORAL | Status: DC | PRN

## 2022-02-13 MED ORDER — MIDAZOLAM HCL 2 MG/2ML IJ SOLN
INTRAMUSCULAR | Status: AC
  Filled 2022-02-13: qty 2

## 2022-02-13 MED ORDER — PROTAMINE SULFATE 10 MG/ML IV SOLN
INTRAVENOUS | Status: DC | PRN
  Administered 2022-02-13: 30 mg via INTRAVENOUS

## 2022-02-13 MED ORDER — PROTAMINE SULFATE 10 MG/ML IV SOLN
INTRAVENOUS | Status: AC
  Filled 2022-02-13: qty 5

## 2022-02-13 MED ORDER — SODIUM CHLORIDE 0.9 % WEIGHT BASED INFUSION: 1.0000 mL/kg/h | INTRAVENOUS | Status: DC

## 2022-02-13 MED ORDER — ONDANSETRON HCL 4 MG/2ML IJ SOLN: 4.0000 mg | Freq: Four times a day (QID) | INTRAMUSCULAR | Status: DC | PRN

## 2022-02-13 MED ORDER — LIDOCAINE HCL (PF) 1 % IJ SOLN
INTRAMUSCULAR | Status: DC | PRN
  Administered 2022-02-13: 10 mL via SUBCUTANEOUS
  Administered 2022-02-13: 5 mL via SUBCUTANEOUS

## 2022-02-13 MED ORDER — HEPARIN SODIUM (PORCINE) 1000 UNIT/ML IJ SOLN
INTRAMUSCULAR | Status: AC
  Filled 2022-02-13: qty 10

## 2022-02-13 MED ORDER — HEPARIN SODIUM (PORCINE) 1000 UNIT/ML IJ SOLN
INTRAMUSCULAR | Status: DC | PRN
  Administered 2022-02-13: 3000 [IU] via INTRAVENOUS
  Administered 2022-02-13: 2000 [IU] via INTRAVENOUS
  Administered 2022-02-13: 5000 [IU] via INTRAVENOUS

## 2022-02-13 MED ORDER — FENTANYL CITRATE (PF) 100 MCG/2ML IJ SOLN
INTRAMUSCULAR | Status: AC
  Filled 2022-02-13: qty 2

## 2022-02-13 MED ORDER — LIDOCAINE HCL (PF) 1 % IJ SOLN
INTRAMUSCULAR | Status: AC
  Filled 2022-02-13: qty 30

## 2022-02-13 MED ORDER — SODIUM CHLORIDE 0.9% FLUSH: 3.0000 mL | INTRAVENOUS | Status: DC | PRN

## 2022-02-13 SURGICAL SUPPLY — 22 items
BALLN MUSTANG 4X60X75 (BALLOONS) ×1
BALLOON MUSTANG 4X60X75 (BALLOONS) IMPLANT
CATH ACCU-VU SIZ PIG 5F 70CM (CATHETERS) IMPLANT
CATH NAVICROSS ANG 65CM (CATHETERS) IMPLANT
CATH OMNI FLUSH 5F 65CM (CATHETERS) IMPLANT
CATHETER NAVICROSS ANG 65CM (CATHETERS) ×1
CLOSURE PERCLOSE PROSTYLE (VASCULAR PRODUCTS) IMPLANT
DEVICE ONE SNARE 10MM (MISCELLANEOUS) IMPLANT
GUIDEWIRE ANGLED .035X150CM (WIRE) IMPLANT
KIT ENCORE 26 ADVANTAGE (KITS) IMPLANT
KIT MICROPUNCTURE NIT STIFF (SHEATH) IMPLANT
KIT PV (KITS) ×2 IMPLANT
SHEATH BRITE TIP 8FR 35CM (SHEATH) IMPLANT
SHEATH PINNACLE 5F 10CM (SHEATH) IMPLANT
SHEATH PROBE COVER 6X72 (BAG) IMPLANT
STENT VIABAHN 9X29X80 VBX (Permanent Stent) IMPLANT
STENT VIABAHNBX 9X39X135 (Permanent Stent) IMPLANT
SYR MEDRAD MARK V 150ML (SYRINGE) IMPLANT
TRANSDUCER W/STOPCOCK (MISCELLANEOUS) ×1 IMPLANT
TRAY PV CATH (CUSTOM PROCEDURE TRAY) ×1 IMPLANT
WIRE BENTSON .035X145CM (WIRE) IMPLANT
WIRE ROSEN-J .035X260CM (WIRE) IMPLANT

## 2022-02-13 NOTE — Progress Notes (Signed)
Report given to Kenney Houseman, RN. D/c instructions also given to pt and wife verbally.

## 2022-02-13 NOTE — H&P (Signed)
Patient seen and examined in preop holding.  No complaints. No changes to medication history or physical exam since last seen in clinic. Imaging reviewed demonstrating left-sided common neck artery critical stenosis versus occlusion.  I long discussion with him regarding left-sided access to repair this in the safest manner followed by left lower extremity runoff.  It appears that the superficial femoral artery is occluded.  Recanalizing inflow should improve his lifestyle limiting claudication significantly. After discussing the risks and benefits of lower extremity angiography, left common, external iliac artery stenting, Franklin Marks elected to proceed.   Franklin Sparrow MD   [facility]  Patient name: Franklin Marks MRN: [id number] DOB: 03-Sep-1950 Sex: male   REQUESTING PROVIDER:   ER   REASON FOR CONSULT:    Left leg pain  HISTORY OF PRESENT ILLNESS:   Franklin Marks is a 71 y.o. male, who presented to the emergency department with pain in his left leg which has been going on for several months.  He was worried that he had hip issues.  He states he can walk approximately 50 feet before he has to stop because of pain in his right hip and thigh as well as calfs.  Resting makes his pain go away.  Last night he felt that his left foot got cold and seem to turn blue.  This is what prompted him to come to the emergency department.  He does not have any pain at rest.  The patient has a history of coronary artery disease, status post CABG.  He suffers from COPD secondary to tobacco abuse.  He takes a statin for hypercholesterolemia.  He is medically managed for hypertension with an ACE inhibitor.  He is on dual antiplatelet therapy with aspirin and Plavix.  PAST MEDICAL HISTORY    Past Medical History:  Diagnosis Date   Anemia    Atrial fibrillation (HCC) 01/13/2012   CAD (coronary artery disease)    a. 2001 s/p CABGx5  (LIMA->LAD, VG->D1, VG->D2, VG->RPAV->RPDA; b. 02/2015 NSTEMI/PCI: VG->D2 95 (3.6x16 Promus DES). VG->D1 100; c. 06/2020 PCI: LM 75, LAD 100ost, D1 70, D2 70, LCX 65m, RCA 100ost, RPAV 100, VG->RPAV->RPDA 80ost (4.5x12 Synergy DES), VG->D1 100, VG->D2 ok, LIMA->LAD ok; d. 07/2020 PCI LM/LCX (2.5x15 Resolute Onyx DES).   COPD (chronic obstructive pulmonary disease) (HCC)    ongoing tobacco use   GERD (gastroesophageal reflux disease)    HFimpEF (heart failure with improved ejection fraction) (HCC)    a. 12/2011 Echo: EF 35-40%; b. 06/2020 Echo: EF 55-60%   Ischemic cardiomyopathy 01/10/2012   a. 12/2011 Echo: EF 35-40%, mild conc LVH,  sev HK of anteroseptum, borderline RVH; b. 06/2020 Echo: EF 55-60%, no rwma, triv MR. Mild Ao sclerosis.   NSTEMI (non-ST elevated myocardial infarction) (HCC) 01/10/2012   Associated with ventricular fibrillation   OSA (obstructive sleep apnea)    a. 07/2020 WatchPat1: 1. Moderate Obstructive Sleep Apnea with AHI 16.5/hr. O2 sat down to 80%.     FAMILY HISTORY   Family History  Problem Relation Age of Onset   Hyperlipidemia Mother    Alcohol abuse Father    Hyperlipidemia Father    Heart disease Sister    Hyperlipidemia Sister    Heart disease Brother    Hyperlipidemia Brother    Hyperlipidemia Sister    Diabetes Neg Hx    Hypertension Neg Hx     SOCIAL HISTORY:   Social History   Socioeconomic History  Marital status: Married    Spouse name: Not on file   Number of children: 4   Years of education: Not on file   Highest education level: Not on file  Occupational History   Occupation: Management --now disabled   Occupation: Holiday representative work    Comment: Part--time  Tobacco Use   Smoking status: Former    Packs/day: 1.00    Years: 45.00    Total pack years: 45.00    Types: Cigarettes    Quit date: 03/13/2015    Years since quitting: 6.9    Passive exposure: Current (as a child)   Smokeless tobacco: Never  Vaping Use   Vaping Use:  Never used  Substance and Sexual Activity   Alcohol use: Not Currently    Comment: really rare beer   Drug use: Yes    Types: Marijuana   Sexual activity: Not Currently  Other Topics Concern   Not on file  Social History Narrative   Married twice   3 children from 1st marriage-- 1 from second   Worked as Investment banker, operational      Has living will   Wife has health care POA---alternate would be daughter Franklin Marks   Would accept resuscitation--- but no prolonged ventilation   No tube feeds if cognitively unaware   Social Determinants of Health   Financial Resource Strain: Not on file  Food Insecurity: Not on file  Transportation Needs: Not on file  Physical Activity: Not on file  Stress: Not on file  Social Connections: Not on file  Intimate Partner Violence: Not on file    ALLERGIES:    Allergies  Allergen Reactions   Codeine Nausea And Vomiting    CURRENT MEDICATIONS:    Current Facility-Administered Medications  Medication Dose Route Frequency Provider Last Rate Last Admin   0.9 %  sodium chloride infusion   Intravenous Continuous Franklin Sparrow, MD 100 mL/hr at 02/13/22 1005 New Bag at 02/13/22 1005    REVIEW OF SYSTEMS:   [X]  denotes positive finding, [ ]  denotes negative finding Cardiac  Comments:  Chest pain or chest pressure:    Shortness of breath upon exertion:    Short of breath when lying flat:    Irregular heart rhythm:        Vascular    Pain in calf, thigh, or hip brought on by ambulation: x   Pain in feet at night that wakes you up from your sleep:     Blood clot in your veins:    Leg swelling:         Pulmonary    Oxygen at home:    Productive cough:     Wheezing:         Neurologic    Sudden weakness in arms or legs:     Sudden numbness in arms or legs:     Sudden onset of difficulty speaking or slurred speech:    Temporary loss of vision in one eye:     Problems with dizziness:         Gastrointestinal    Blood in stool:       Vomited blood:         Genitourinary    Burning when urinating:     Blood in urine:        Psychiatric    Major depression:         Hematologic    Bleeding problems:    Problems with blood clotting too easily:  Skin    Rashes or ulcers:        Constitutional    Fever or chills:     PHYSICAL EXAM:   Vitals:   02/13/22 0955 02/13/22 1233  BP: 121/78   Pulse: (!) 57   Resp: 16   Temp: (!) 97.1 F (36.2 C)   TempSrc: Temporal   SpO2: 98% 99%  Weight: 76.2 kg   Height: 5\' 9"  (1.753 m)     GENERAL: The patient is a well-nourished male, in no acute distress. The vital signs are documented above. CARDIAC: There is a regular rate and rhythm.  VASCULAR: Palpable right femoral pulse, nonpalpable left.  Pedal pulses are nonpalpable PULMONARY: Nonlabored respirations ABDOMEN: Soft and non-tender with normal pitched bowel sounds.  MUSCULOSKELETAL: There are no major deformities or cyanosis. NEUROLOGIC: Motor and sensory function are intact in both feet SKIN: There are no ulcers or rashes noted. PSYCHIATRIC: The patient has a normal affect.  STUDIES:   I have reviewed the following studies: CT angiogram: VASCULAR   Advanced arterial atherosclerosis as detailed in findings. Of note there is complete occlusion of the left common iliac artery and bilateral superficial femoral arteries. No comparison is available though this is favored chronic given the degree of collateral flow through the profunda femoral artery distribution bilaterally.   Two-vessel runoff of the right lower extremity and three-vessel runoff of the left lower extremity.   NON-VASCULAR   Colonic diverticulosis without diverticulitis.   Cholelithiasis. Venous Dopplers: Negative for DVT Arterial Dopplers: +-------+-----------+-----------+------------+------------+  ABI/TBIToday's ABIToday's TBIPrevious ABIPrevious TBI  +-------+-----------+-----------+------------+------------+   Right 0.67       0.45       0.87        0.63          +-------+-----------+-----------+------------+------------+  Left  0.44       0.14       0.98        0.76          +-------+-----------+-----------+------------+------------+    ASSESSMENT and PLAN   Bilateral claudication, left greater than right: The patient is imaging revealed a left common iliac occlusion which I think explains his left hip and leg pain.  The neck step will be angiography to define his anatomy, and attempt to recanalize his occluded common iliac artery.  The patient would like to go home.  Therefore, I will get this arranged for next week.  All questions answered.   , MD, FACS [facility] [phone] Pager [phone]

## 2022-02-13 NOTE — Op Note (Signed)
Patient name: Franklin Marks MRN: [id number] DOB: 1950-07-28 Sex: male  02/13/2022 Pre-operative Diagnosis: Left lower extremity lifestyle limiting claudication Post-operative diagnosis:  Same Surgeon:  Victorino Sparrow, MD Procedure Performed: 1.  Ultrasound-guided bilateral common femoral artery access 2.  Abdominal angiogram 3.  Bilateral common iliac artery stenting right 9 x 29, left 9 x 39 VBX 4.  Bilateral Perclose devices 5.  Moderate sedation time 5 minutes 6.  Contrast volume 180 mL  Indications: Patient is a 71 year old male who presented to the emergency department last week with pain in his left leg which has been ongoing for several months.  He was worried he had hip issues, and stated he can only walk approximately 50 feet before severe pain in his hip and thigh.  He noticed color changes in the foot, and a cold sensation, but no tissue loss.  CT angiogram demonstrated occluded left common femoral artery with reconstitution distally.  Also showed bilateral SFA occlusions with reconstitution of the popliteal arteries and three-vessel runoff.  Findings:  Bilateral renal arteries patent.  Accessory renal arteries bilaterally Widely patent infrarenal abdominal aorta.   No flow-limiting stenosis appreciated in the right iliac system  Flush occlusion of the left common iliac artery with reconstitution of the hypogastric artery.  Left external iliac artery without flow-limiting stenosis   Procedure:  The patient was identified in the holding area and taken to room 8.  The patient was then placed supine on the table and prepped and draped in the usual sterile fashion.  A time out was called.    I elected to access the left common femoral artery using an ultrasound-guided micropuncture needle for retrograde stenting of the left common iliac.  The wire would not cross the left common iliac artery, therefore a 5 French sheath was placed and left groin diagnostic angiography followed.   This demonstrated occlusion of the left common iliac artery with reconstitution at the hypogastric artery.  I then moved to access the right groin using the same sound guided micropuncture technique.  A 5 French sheath was placed, and aortogram followed.  See results above.  After significant amount of time, I was able to use a Omni Flush catheter and Glidewire advantage to seat in the occluded left common iliac artery.  I was unable to drive the wire distally.  True lumen was confirmed with use of a catheter.  The patient was heparinized with 1000 units IV heparin. Next, I used a 4 French snare and snared the wire from the right side, pulling it out of the left-sided groin access.  Wire was reintroduced through the catheter and run into the abdominal aorta creating bilateral femoral artery access to the aorta.  The occlusion was dense, and an 8 [place] sheath would not track.  I used a 4 mm balloon in an effort to create luminal gain.  This worked, and I was able to advance new 8 [place] sheath into the abdominal aorta.  The right-sided 5 French sheath was also exchanged for an 8 [place] sheath.  Angiography followed.  The left-sided lesion was measured using a pigtail catheter.  Being that there was a flush occlusion, I felt to the bifurcation needed to be raised.  Using preoperative CT scan, I elected to use 9 x 39 mm VBX on the left, 9 x 29 mm VBX on the right.  These were inflated in tandem.  Follow-up angiogram demonstrated Result, however there was some rebound in the left common iliac,  and therefore they were read ballooned to an atmosphere 14.  Follow-up angiography demonstrated excellent result with no extravasation, no flow-limiting stenosis.  I felt this was an excellent result.  Unfortunately, I have used a significant mount contrast, and therefore decided to defer the left lower extremity angiogram as previous CT demonstrated occluded SFA.  Being that Collie was a lifestyle limiting claudicant,  I think this will significantly improve his ambulation, and he will not need further intervention.  He is aware that should he need further intervention he would require repeat angiography dedicated to the limb of interest.  Bilateral groins were closed using Pro-glide devices without issue., and the bifurcation was   Impression: Successful recanalization of the left common iliac artery with bilateral kissing iliac stents.  Right 9 x 29, left 9 x 39 VBX   Fara Olden, MD [facility] [place] Office: [phone]

## 2022-02-14 ENCOUNTER — Encounter (HOSPITAL_COMMUNITY): Payer: Self-pay | Admitting: Vascular Surgery

## 2022-02-14 ENCOUNTER — Telehealth: Payer: Self-pay

## 2022-02-14 NOTE — Chronic Care Management (AMB) (Addendum)
    Chronic Care Management Pharmacy Assistant   Name: Franklin Marks  MRN: [id number] DOB: 1950/11/22  Reason for Encounter: Hospital Follow Up Non CCM    Medications: Outpatient Encounter Medications as of 02/14/2022  Medication Sig   aspirin EC 81 MG tablet Take 81 mg by mouth in the morning and at bedtime. Swallow whole.   atorvastatin (LIPITOR) 80 MG tablet TAKE ONE (1) TABLET BY MOUTH EACH EVENING AT 6PM   clonazePAM (KLONOPIN) 0.5 MG tablet TAKE 1 TABLET BY MOUTH EVERY DAY AT BEDTIME AS NEEDED (Patient taking differently: Take 0.25-0.5 mg by mouth at bedtime as needed (sleep/anxiety).)   clopidogrel (PLAVIX) 75 MG tablet TAKE ONE TABLET BY MOUTH EACH MORNING WITH BREAKFAST   isosorbide mononitrate (IMDUR) 30 MG 24 hr tablet TAKE 1 TABLET BY MOUTH EVERY DAY   lisinopril (ZESTRIL) 5 MG tablet TAKE 1/2 TABLET BY MOUTH EVERY DAY   Omega-3 Fatty Acids (FISH OIL) 1000 MG CAPS Take 1,000 mg by mouth in the morning and at bedtime.   pantoprazole (PROTONIX) 20 MG tablet TAKE 1 TABLET BY MOUTH EVERY DAY AS NEEDED (Patient not taking: Reported on 02/11/2022)   No facility-administered encounter medications on file as of 02/14/2022.      Reviewed hospital notes for details of recent visit. Has patient been contacted by Transitions of Care team? No Has patient seen PCP/specialist for hospital follow up (summarize OV if yes): No   02/13/22-Joshua Robins,MD([facility]-arterial aortagram procedure. No medication changes  Admitted to the ED on 02/08/22. Discharge date was 02/08/22.  Discharged from [facility] ED Discharge diagnosis (Principal Problem): claudication  Patient was discharged to Home  Brief summary of hospital course:  Patient is a 71 year old who presents with worsening claudication type symptoms.  He had what sounded like a cyanotic cold foot last night but that had resolved.  On exam I cannot palpate pulses in the left foot but  clinically does not have an acute ischemic foot.  It is warm and similar in color to the other side.  He did have arterial ultrasounds which showed severe arterial disease on the left and moderate on the right.  I did consult with Dr. Myra Gianotti with vascular surgery who will come evaluate the patient.  His EKG shows a sinus rhythm.  Labs are nonconcerning.  He had chest x-ray which shows no evidence of pulmonary edema.  No pneumonia.  This was interpreted by me and confirmed by the radiologist.  Patient care turned over to Dr. Jearld Fenton pending vascular surgery consult.    Medications that remain the same after Hospital Discharge:??  -All other medications will remain the same.    Next CCM appt: none  Other upcoming appts: No appointments scheduled within the next 30 days.  Al Corpus, PharmD notified and will determine if action is needed.   Burt Knack, [facility] concierge  [phone]  Pharmacist addendum: Pt had aortogram 11/29. No further action needed.  Al Corpus, PharmD, BCACP 02/14/22 2:23 PM

## 2022-03-01 ENCOUNTER — Other Ambulatory Visit: Payer: Self-pay | Admitting: *Deleted

## 2022-03-01 DIAGNOSIS — L819 Disorder of pigmentation, unspecified: Secondary | ICD-10-CM

## 2022-03-12 NOTE — Progress Notes (Signed)
HISTORY AND PHYSICAL     CC:  follow up. Requesting Provider:  [name], MD  HPI: This is a 71 y.o. male who is here today for follow up for PAD.  Pt has hx of hx of left leg pain for several months.  He was walking about 50 feet before stopping bc of pain in the right hip and thigh as well as his calves and improved with rest.    On 02/13/2022, he underwent angiogram with placement of bilateral CIA stents by Dr. Karin Lieu.   The patient has a history of coronary artery disease, status post CABG. He suffers from COPD secondary to tobacco abuse.    The pt returns today for follow up.  He states that his pain has completely resolved.  He denies any rest pain, claudication or non healing wounds.  He states that he could hardly walk 10 steps before procedure.  He is compliant with his asa/statin/plavix.    The pt is on a statin for cholesterol management.    The pt is on an aspirin.    Other AC:  Plavix The pt is on ACEI for hypertension.  The pt does not have diabetes. Tobacco hx:  former  Pt does not have family hx of AAA.  Past Medical History:  Diagnosis Date   Anemia    Atrial fibrillation (HCC) 01/13/2012   CAD (coronary artery disease)    a. 2001 s/p CABGx5 (LIMA->LAD, VG->D1, VG->D2, VG->RPAV->RPDA; b. 02/2015 NSTEMI/PCI: VG->D2 95 (3.6x16 Promus DES). VG->D1 100; c. 06/2020 PCI: LM 75, LAD 100ost, D1 70, D2 70, LCX 68m, RCA 100ost, RPAV 100, VG->RPAV->RPDA 80ost (4.5x12 Synergy DES), VG->D1 100, VG->D2 ok, LIMA->LAD ok; d. 07/2020 PCI LM/LCX (2.5x15 Resolute Onyx DES).   COPD (chronic obstructive pulmonary disease) (HCC)    ongoing tobacco use   GERD (gastroesophageal reflux disease)    HFimpEF (heart failure with improved ejection fraction) (HCC)    a. 12/2011 Echo: EF 35-40%; b. 06/2020 Echo: EF 55-60%   Ischemic cardiomyopathy 01/10/2012   a. 12/2011 Echo: EF 35-40%, mild conc LVH,  sev HK of anteroseptum, borderline RVH; b. 06/2020 Echo: EF 55-60%, no rwma, triv MR.  Mild Ao sclerosis.   NSTEMI (non-ST elevated myocardial infarction) (HCC) 01/10/2012   Associated with ventricular fibrillation   OSA (obstructive sleep apnea)    a. 07/2020 WatchPat1: 1. Moderate Obstructive Sleep Apnea with AHI 16.5/hr. O2 sat down to 80%.    Past Surgical History:  Procedure Laterality Date   ABDOMINAL AORTOGRAM W/LOWER EXTREMITY N/A 02/13/2022   Procedure: ABDOMINAL AORTOGRAM W/LOWER EXTREMITY;  Surgeon: Victorino Sparrow, MD;  Location: [facility] INVASIVE CV LAB;  Service: Cardiovascular;  Laterality: N/A;   CARDIAC CATHETERIZATION  2011   At [facility]   CARDIAC CATHETERIZATION  12/2011   prox LAD occlusion, ostial RCA occlusion, 30% prox LCx stenosis, LIMA-LAD: 80% post-anastamosis lesion, SVG-Dx2: old occlusion w/ thrombus (noted on prior 2011 cath at [facility]), SVG-Dx: patent, SVG-RCA: patent, diffuse irregs, 80-90% PDA/PLA lesions patent   CARDIAC CATHETERIZATION N/A 03/15/2015   Procedure: Left Heart Cath and Cors/Grafts Angiography;  Surgeon: Kathleene Hazel, MD;  Location: [facility] INVASIVE CV LAB;  Service: Cardiovascular;  Laterality: N/A;   CARDIAC CATHETERIZATION N/A 03/15/2015   Procedure: Coronary Stent Intervention;  Surgeon: Kathleene Hazel, MD;  Location: [facility] INVASIVE CV LAB;  Service: Cardiovascular;  Laterality: N/A;  svg to diagnoal 2   CORONARY ARTERY BYPASS GRAFT  2001   CORONARY STENT INTERVENTION N/A 07/10/2020   Procedure:  CORONARY STENT INTERVENTION;  Surgeon: Corky CraftsVaranasi, Jayadeep S, MD;  [facility];  Service: Cardiovascular;  Laterality: N/A;   CORONARY STENT INTERVENTION N/A 07/26/2020   Procedure: CORONARY STENT INTERVENTION;  Surgeon: Iran OuchArida, Muhammad A, MD;  Location: [facility] INVASIVE CV LAB;  Service: Cardiovascular;  Laterality: N/A;   INTRAVASCULAR IMAGING/OCT N/A 07/26/2020   Procedure: INTRAVASCULAR IMAGING/OCT;  Surgeon: Iran OuchArida, Muhammad A, MD;  Location: [facility] INVASIVE CV LAB;  Service: Cardiovascular;  Laterality: N/A;   INTRAVASCULAR  ULTRASOUND/IVUS N/A 07/10/2020   Procedure: Intravascular Ultrasound/IVUS;  Surgeon: Corky CraftsVaranasi, Jayadeep S, MD;  [facility];  Service: Cardiovascular;  Laterality: N/A;   LEFT HEART CATH AND CORS/GRAFTS ANGIOGRAPHY N/A 07/10/2020   Procedure: LEFT HEART CATH AND CORS/GRAFTS ANGIOGRAPHY;  Surgeon: Corky CraftsVaranasi, Jayadeep S, MD;  [facility];  Service: Cardiovascular;  Laterality: N/A;   LEFT HEART CATHETERIZATION WITH CORONARY ANGIOGRAM N/A 01/10/2012   Procedure: LEFT HEART CATHETERIZATION WITH CORONARY ANGIOGRAM;  Surgeon: Lennette Biharihomas A Kelly, MD;  [facility];  Service: Cardiovascular;  Laterality: N/A;    Allergies  Allergen Reactions   Codeine Nausea And Vomiting    Current Outpatient Medications  Medication Sig Dispense Refill   aspirin EC 81 MG tablet Take 81 mg by mouth in the morning and at bedtime. Swallow whole.     atorvastatin (LIPITOR) 80 MG tablet TAKE ONE (1) TABLET BY MOUTH EACH EVENING AT 6PM 90 tablet 3   clonazePAM (KLONOPIN) 0.5 MG tablet TAKE 1 TABLET BY MOUTH EVERY DAY AT BEDTIME AS NEEDED (Patient taking differently: Take 0.25-0.5 mg by mouth at bedtime as needed (sleep/anxiety).) 30 tablet 0   clopidogrel (PLAVIX) 75 MG tablet TAKE ONE TABLET BY MOUTH EACH MORNING WITH BREAKFAST 90 tablet 0   isosorbide mononitrate (IMDUR) 30 MG 24 hr tablet TAKE 1 TABLET BY MOUTH EVERY DAY 90 tablet 3   lisinopril (ZESTRIL) 5 MG tablet TAKE 1/2 TABLET BY MOUTH EVERY DAY 45 tablet 3   Omega-3 Fatty Acids (FISH OIL) 1000 MG CAPS Take 1,000 mg by mouth in the morning and at bedtime.     pantoprazole (PROTONIX) 20 MG tablet TAKE 1 TABLET BY MOUTH EVERY DAY AS NEEDED (Patient not taking: Reported on 02/11/2022) 90 tablet 3   No current facility-administered medications for this visit.    Family History  Problem Relation Age of Onset   Hyperlipidemia Mother    Alcohol abuse Father    Hyperlipidemia Father    Heart disease Sister    Hyperlipidemia  Sister    Heart disease Brother    Hyperlipidemia Brother    Hyperlipidemia Sister    Diabetes Neg Hx    Hypertension Neg Hx     Social History   Socioeconomic History   Marital status: Married    Spouse name: Not on file   Number of children: 4   Years of education: Not on file   Highest education level: Not on file  Occupational History   Occupation: Management --now disabled   Occupation: Holiday representativeConstruction work    Comment: Part--time  Tobacco Use   Smoking status: Former    Packs/day: 1.00    Years: 45.00    Total pack years: 45.00    Types: Cigarettes    Quit date: 03/13/2015    Years since quitting: 7.0    Passive exposure: Current (as a child)   Smokeless tobacco: Never  Vaping Use   Vaping Use: Never used  Substance and Sexual Activity  Alcohol use: Not Currently    Comment: really rare beer   Drug use: Yes    Types: Marijuana   Sexual activity: Not Currently  Other Topics Concern   Not on file  Social History Narrative   Married twice   3 children from 1st marriage-- 1 from second   Worked as Investment banker, operational      Has living will   Wife has health care POA---alternate would be daughter Judeth Cornfield   Would accept resuscitation--- but no prolonged ventilation   No tube feeds if cognitively unaware   Social Determinants of Health   Financial Resource Strain: Not on file  Food Insecurity: Not on file  Transportation Needs: Not on file  Physical Activity: Not on file  Stress: Not on file  Social Connections: Not on file  Intimate Partner Violence: Not on file     REVIEW OF SYSTEMS:   [X]  denotes positive finding, [ ]  denotes negative finding Cardiac  Comments:  Chest pain or chest pressure:    Shortness of breath upon exertion:    Short of breath when lying flat:    Irregular heart rhythm:        Vascular    Pain in calf, thigh, or hip brought on by ambulation:    Pain in feet at night that wakes you up from your sleep:     Blood clot in your  veins:    Leg swelling:         Pulmonary    Oxygen at home:    Productive cough:     Wheezing:         Neurologic    Sudden weakness in arms or legs:     Sudden numbness in arms or legs:     Sudden onset of difficulty speaking or slurred speech:    Temporary loss of vision in one eye:     Problems with dizziness:         Gastrointestinal    Blood in stool:     Vomited blood:         Genitourinary    Burning when urinating:     Blood in urine:        Psychiatric    Major depression:         Hematologic    Bleeding problems:    Problems with blood clotting too easily:        Skin    Rashes or ulcers:        Constitutional    Fever or chills:      PHYSICAL EXAMINATION:  Today's Vitals   03/15/22 0933  BP: (!) 147/93  Pulse: 66  Resp: 20  Temp: (!) 97.5 F (36.4 C)  TempSrc: Temporal  SpO2: 95%  Weight: 173 lb 1.6 oz (78.5 kg)  Height: 5\' 9"  (1.753 m)   Body mass index is 25.56 kg/m.   General:  WDWN in NAD; vital signs documented above Gait: Not observed HENT: WNL, normocephalic Pulmonary: normal non-labored breathing , without wheezing Cardiac: regular HR, without carotid bruits Abdomen: soft, NT; aortic pulse is not palpable Skin: without rashes Vascular Exam/Pulses:  Right Left  Radial 2+ (normal) 2+ (normal)  Femoral 2+ (normal) 2+ (normal)  Popliteal Unable to palpate Unable to palpate  PT 2+ (normal) 2+ (normal)   Extremities: without ischemic changes, without Gangrene , without cellulitis; without open wounds Musculoskeletal: no muscle wasting or atrophy  Neurologic: A&O X 3 Psychiatric:  The pt has Normal affect.  Non-Invasive Vascular Imaging:   ABI's/TBI's on 03/15/2022: Right:  0.70/0.43 - Great toe pressure: 50 Left:  0.99/0.83 - Great toe pressure: 96  Arterial duplex on 03/15/2022: Patent common iliac artery stents, bilaterally.   Previous ABI's/TBI's on 02/08/2022: Right:  0.67/0.45 - Great toe pressure: 49 Left:   0.44/0.14 - Great toe pressure:  15    ASSESSMENT/PLAN:: 71 y.o. male here for follow up for PAD with hx of angiogram with placement of bilateral CIA stents by Dr. Karin Lieu for LLE lifestyle limiting cluadication   -pt doing well with resolution of lifestyle limiting claudication.  ABI much improved on the left and essentially unchanged on the right but again, symptoms improved.  Discussed with pt that we will see him again in 6 months with repeat bilateral aortoiliac duplex and ABI but if he develops rest pain, non healing wounds or new claudication we would see him back sooner.  He understands that if he has issues in the future, he may need repeat angiogram.   -continue asa/statin/plavix    Doreatha Massed, [facility] [phone]  Clinic MD:   Karin Lieu

## 2022-03-15 ENCOUNTER — Ambulatory Visit (INDEPENDENT_AMBULATORY_CARE_PROVIDER_SITE_OTHER)
Admission: RE | Admit: 2022-03-15 | Discharge: 2022-03-15 | Disposition: A | Discharge status: Home / Self Care | Payer: PPO | Source: Ambulatory Visit | Attending: Vascular Surgery | Admitting: Vascular Surgery

## 2022-03-15 ENCOUNTER — Ambulatory Visit (INDEPENDENT_AMBULATORY_CARE_PROVIDER_SITE_OTHER): Payer: PPO | Admitting: Physician Assistant

## 2022-03-15 ENCOUNTER — Ambulatory Visit (HOSPITAL_COMMUNITY)
Admission: RE | Admit: 2022-03-15 | Discharge: 2022-03-15 | Disposition: A | Discharge status: Home / Self Care | Payer: PPO | Source: Ambulatory Visit | Attending: Vascular Surgery | Admitting: Vascular Surgery

## 2022-03-15 VITALS — BP 147/93 | HR 66 | Temp 97.5°F | Resp 20 | Ht 69.0 in | Wt 173.1 lb

## 2022-03-15 DIAGNOSIS — I739 Peripheral vascular disease, unspecified: Secondary | ICD-10-CM | POA: Diagnosis not present

## 2022-03-15 DIAGNOSIS — L819 Disorder of pigmentation, unspecified: Secondary | ICD-10-CM

## 2022-03-20 ENCOUNTER — Other Ambulatory Visit: Payer: Self-pay

## 2022-03-20 DIAGNOSIS — I70222 Atherosclerosis of native arteries of extremities with rest pain, left leg: Secondary | ICD-10-CM

## 2022-03-20 DIAGNOSIS — I739 Peripheral vascular disease, unspecified: Secondary | ICD-10-CM

## 2022-04-01 ENCOUNTER — Other Ambulatory Visit: Payer: Self-pay | Admitting: Internal Medicine

## 2022-04-01 ENCOUNTER — Other Ambulatory Visit: Payer: Self-pay | Admitting: Cardiology

## 2022-04-01 NOTE — Telephone Encounter (Signed)
Last filled 02-04-22 #30 Last OV 09-14-21 CPE Next OV 09-16-22 [facility]

## 2022-04-01 NOTE — Telephone Encounter (Signed)
Please schedule 6 month F/U appt for further refills. Thank you! 

## 2022-04-04 NOTE — Telephone Encounter (Signed)
Can he be seen sooner or are the APP's booked out that far?

## 2022-05-06 NOTE — Progress Notes (Signed)
Cardiology Clinic Note   Patient Name: Franklin Marks Date of Encounter: 05/10/2022  Primary Care Provider:  Venia Carbon, MD Primary Cardiologist:  Kathlyn Sacramento, MD  Patient Profile    72 year old male with a history of coronary disease status post CABG with more previous stenting, peripheral arterial disease with recent stenting, chronic heart failure with improved ejection fraction, ischemic cardiomyopathy, hyperlipidemia, provide tobacco abuse, COPD, gastroesophageal reflux disease, anemia, obstructive sleep apnea, repair atrial fibrillation who presents today for follow-up of his coronary artery disease and ischemic cardiomyopathy.  Past Medical History    Past Medical History:  Diagnosis Date   Anemia    Atrial fibrillation ([place]) 01/13/2012   CAD (coronary artery disease)    a. 2001 s/p CABGx5 (LIMA->LAD, VG->D1, VG->D2, VG->RPAV->RPDA; b. 02/2015 NSTEMI/PCI: VG->D2 95 (3.6x16 Promus DES). VG->D1 100; c. 06/2020 PCI: LM 75, LAD 100ost, D1 70, D2 70, LCX 30m RCA 100ost, RPAV 100, VG->RPAV->RPDA 80ost (4.5x12 Synergy DES), VG->D1 100, VG->D2 ok, LIMA->LAD ok; d. 07/2020 PCI LM/LCX (2.5x15 Resolute Onyx DES).   COPD (chronic obstructive pulmonary disease) (HCC)    ongoing tobacco use   GERD (gastroesophageal reflux disease)    [place]EF (heart failure with improved [place] fraction) [place]    a. 12/2011 Echo: EF 35-40%; b. 06/2020 Echo: EF 55-60%   Ischemic cardiomyopathy 01/10/2012   a. 12/2011 Echo: EF 35-40%, mild conc LVH,  sev HK of anteroseptum, borderline RVH; b. 06/2020 Echo: EF 55-60%, no rwma, triv MR. Mild Ao sclerosis.   [place]I (non-ST elevated myocardial infarction) [place] 01/10[place]2012   Associated with ventricular fibrillation   OSA (obstructive sleep apnea)    a. 07/2020 WatchPat1: 1. Moderate Obstructive Sleep Apnea with AHI 16.5/hr. O2 sat down to 80%.   Past Surgical History:  Procedure Laterality Date   ABDOMINAL AORTOGRAM W/LOWER EXTREMITY N/A 02/13/2022    Procedure: ABDOMINAL AORTOGRAM W/LOWER EXTREMITY;  Surgeon: RBroadus John MD;  [facility];  Service: Cardiovascular;  Laterality: N/A;   CARDIAC CATHETERIZATION  2011   [place]   prox LAD occlusion, ostial RCA occlusion, 30% prox LCx stenosis, LIMA-LAD: 80% post-anastamosis lesion, SVG-Dx2: old occlusion w/ thrombus ([facility], SVG-Dx: patent, SVG-RCA: patent, diffuse irregs, 80-90% PDA/PLA lesions patent   CARDIAC CATHETERIZATION N/A 03/15/2015   Procedure: Left Heart Cath and Cors/Grafts Angiography;  Surgeon: CBurnell Blanks MD;  [facility];  Service: Cardiovascular;  Laterality: N/A;   CARDIAC CATHETERIZATION N/A 03/15/2015   Procedure: Coronary Stent Intervention;  Surgeon: CBurnell Blanks MD;  [facility];  Service: Cardiovascular;  Laterality: N/A;  svg to diagnoal 2   CORONARY ARTERY BYPASS GRAFT  2001   CORONARY STENT INTERVENTION N/A 07/10/2020   Procedure: CORONARY STENT INTERVENTION;  Surgeon: VJettie Booze MD;  [facility];  Service: Cardiovascular;  Laterality: N/A;   CORONARY STENT INTERVENTION N/A 07/26/2020   Procedure: CORONARY STENT INTERVENTION;  Surgeon: AWellington Hampshire MD;  [place] [facility];  Service: Cardiovascular;  Laterality: N/A;   INTRAVASCULAR IMAGING/OCT N/A 07/26/2020   Procedure: INTRAVASCULAR IMAGING/OCT;  Surgeon: AWellington Hampshire MD;  [place] [facility];  Service: Cardiovascular;  Laterality: N/A;   INTRAVASCULAR ULTRASOUND/IVUS N/A 07/10/2020   Procedure: Intravascular Ultrasound/IVUS;  Surgeon: VJettie Booze MD;  [place];  Service: Cardiovascular;  Laterality: N/A;   LEFT HEART CATH AND CORS/GRAFTS  ANGIOGRAPHY N/A 07/10/2020   Procedure: LEFT HEART CATH AND CORS/GRAFTS ANGIOGRAPHY;  Surgeon: Jettie Booze, MD;  Location: [place] [facility];   Service: Cardiovascular;  Laterality: N/A;   LEFT HEART CATHETERIZATION WITH CORONARY ANGIOGRAM N/A 01/10/2012   Procedure: LEFT HEART CATHETERIZATION WITH CORONARY ANGIOGRAM;  Surgeon: Troy Sine, MD;  [facility];  Service: Cardiovascular;  Laterality: N/A;    Allergies  Allergies  Allergen Reactions   Codeine Nausea And Vomiting    History of Present Illness    Franklin Marks 72 year old male with previous mentioned complex past medical history of cardiac arrest with coronary artery disease status post CABG times x 5 (2001), who also subsequently suffered from an NSTEMI in December 2016 and underwent PCI with stenting to the vein graft to D2, chronic total occlusion of the bilateral SFAs with distal reconstitution via collaterals and recent stenting by vascular, chronic heart failure with improved ejection fraction, ischemic cardiomyopathy, remote tobacco abuse, COPD, gastroesophageal reflux disease, anemia, remote history of atrial fibrillation.  In July 2021 he had worsening dyspnea and progressive weight gain with his carvedilol was discontinued secondary to symptomatic bradycardia.  Echocardiogram completed in August 2021 revealed LVEF 50-55%.  In late April 2022 was admitted to [facility] for chest pain with normal troponins and underwent diagnostic catheterization which revealed severe disease of the ostial pancreatic.  He underwent successful PCI/DES to the RPA V to RPDA, with subsequent staged PCI and DES placement to the distal left main/ostial left circumflex.  Repeat echocardiogram revealed LVEF 55 to 60%.  He was last seen in clinic 09/19/2021 stating that overall he was doing well.  He did have a cough at that time but thought that it was due to sleeping in the air conditioning.  He stated his insurance would not cover his CPAP so he remains with treatment for his mild obstructive sleep apnea.  He was under an increased amount of stress with his wife being diagnosed with  cancer for the second time.  Chronic changes made to his medications and no further testing that was needed at that time.  He presented to the [facility] emergency department on 02/08/2022 with complaints of leg pain.  He had stated that the night before his foot did get an ice cold and seem to turn blue.  He had wrapped in warm towels and seem to get better after a while.  He has a known history of peripheral arterial disease.  On exam in the emergency department they were able to palpate right PT pulses but unable to palpate any on the left.  He had a CT with runoff that was completed which revealed occlusion of the left common iliac and bilateral superficial femoral arteries suspected to be chronic.  Since he was deemed to be chronic he was okay to be discharged from the emergency department to have the procedure done as an outpatient with vascular in the upcoming 1 to 2 weeks.  On 02/13/2022 he underwent ultrasound-guided bilateral common femoral artery access with abdominal angiogram with bilateral common iliac artery stenting.  CT angiogram demonstrated occluded left common femoral artery with reconstitution distally.  Also showed bilateral SFA occlusions with reconstitution of the popliteal arteries and three-vessel runoff.  He had successful recannulization of the left common iliac artery with bilateral kissing iliac stents.  He returns to clinic today stating that he has been doing very well. He denies any further leg and hip pain after having iliac stents placed by  [facility]. Denies any chest pain, shortness of breath, claudication, palpitations, or peripheral edema. Continues to remain compliant with his medication regimen.   Home Medications    Current Outpatient Medications  Medication Sig Dispense Refill   aspirin EC 81 MG tablet Take 81 mg by mouth in the morning and at bedtime. Swallow whole.     atorvastatin (LIPITOR) 80 MG tablet TAKE ONE (1) TABLET BY MOUTH EACH EVENING AT 6PM 90 tablet 3    clonazePAM (KLONOPIN) 0.5 MG tablet Take 0.5-1 tablets (0.25-0.5 mg total) by mouth at bedtime as needed (sleep/anxiety). 30 tablet 0   clopidogrel (PLAVIX) 75 MG tablet TAKE ONE TABLET BY MOUTH EACH MORNING WITH BREAKFAST 90 tablet 0   isosorbide mononitrate (IMDUR) 30 MG 24 hr tablet TAKE 1 TABLET BY MOUTH EVERY DAY 90 tablet 3   lisinopril (ZESTRIL) 5 MG tablet TAKE 1/2 TABLET BY MOUTH EVERY DAY 45 tablet 3   Omega-3 Fatty Acids (FISH OIL) 1000 MG CAPS Take 1,000 mg by mouth in the morning and at bedtime.     pantoprazole (PROTONIX) 20 MG tablet TAKE 1 TABLET BY MOUTH EVERY DAY AS NEEDED 90 tablet 3   No current facility-administered medications for this visit.     Family History    Family History  Problem Relation Age of Onset   Hyperlipidemia Mother    Alcohol abuse Father    Hyperlipidemia Father    Heart disease Sister    Hyperlipidemia Sister    Heart disease Brother    Hyperlipidemia Brother    Hyperlipidemia Sister    Diabetes Neg Hx    Hypertension Neg Hx    He indicated that his mother is deceased. He indicated that his father is deceased. He indicated that only one of his two sisters is alive. He indicated that his brother is deceased. He indicated that the status of his neg hx is unknown.  Social History    Social History   Socioeconomic History   Marital status: Married    Spouse name: Not on file   Number of children: 4   Years of education: Not on file   Highest education level: Not on file  Occupational History   Occupation: Management --now disabled   Occupation: Architect work    Comment: Part--time  Tobacco Use   Smoking status: Former    Packs/day: 1.00    Years: 45.00    Total pack years: 45.00    Types: Cigarettes    Quit date: 03/13/2015    Years since quitting: 7.1    Passive exposure: Current (as a child)   Smokeless tobacco: Never  Vaping Use   Vaping Use: Never used  Substance and Sexual Activity   Alcohol use: Not Currently     Comment: really rare beer   Drug use: Yes    Types: Marijuana   Sexual activity: Not Currently  Other Topics Concern   Not on file  Social History Narrative   Married twice   3 children from 1st marriage-- 1 from second   Worked as Oceanographer      Has living will   Wife has health care POA---alternate would be daughter [place]ta [place]   Would accept resuscitation--- but no prolonged ventilation   No tube feeds if cognitively unaware   Social Determinants of Health   Financial Resource Strain: Not on file  Food Insecurity: Not on file  Transportation Needs: Not on file  Physical Activity: Not on file  Stress: Not on file  Social Connections: Not on file  Intimate Partner Violence: Not on file     Review of Systems    General:  No chills, fever, night sweats or weight changes.  Cardiovascular:  No chest pain, dyspnea on exertion, edema, orthopnea, palpitations, paroxysmal nocturnal dyspnea. Dermatological: No rash, lesions/masses Respiratory: No cough, dyspnea Urologic: No hematuria, dysuria Abdominal:   No nausea, vomiting, diarrhea, bright red blood per rectum, melena, or hematemesis Neurologic:  No visual changes, wkns, changes in mental status. All other systems reviewed and are otherwise negative except as noted above.   Physical Exam    VS:  BP 108/70 (BP Location: Left Arm, Patient Position: Sitting, Cuff Size: Normal)   Pulse 61   Ht '5\' 10"'$  (1.778 m)   Wt 174 lb (78.9 kg)   SpO2 98%   BMI 24.97 kg/m  , BMI Body mass index is 24.97 kg/m.     GEN: Well nourished, well developed, in no acute distress. HEENT: normal. Neck: Supple, no JVD, carotid bruits, or masses. Cardiac: RRR, no murmurs, rubs, or gallops. No clubbing, cyanosis, edema.  Radials 2+/PT 1+ and equal bilaterally.  Respiratory:  Respirations regular and unlabored, clear to auscultation bilaterally. GI: Soft, nontender, nondistended, BS + x 4. MS: no deformity or atrophy. Skin: warm and dry,  no rash. Neuro:  Strength and sensation are intact. Psych: Normal affect.  Accessory Clinical Findings    ECG personally reviewed by me today- sinus rhythm rate of 61 with first degree AV block, RBBB, and unifocal PVC - No acute changes  Lab Results  Component Value Date   WBC 6.6 02/08/2022   HGB 13.0 02/08/2022   HCT 38.7 (L) 02/08/2022   MCV 92.1 02/08/2022   PLT 173 02/08/2022   Lab Results  Component Value Date   CREATININE 0.93 02/08/2022   BUN 18 02/08/2022   NA 138 02/08/2022   K 4.3 02/08/2022   CL 106 02/08/2022   CO2 20 (L) 02/08/2022   Lab Results  Component Value Date   ALT 13 09/14/2021   AST 12 09/14/2021   ALKPHOS 113 09/14/2021   BILITOT 0.5 09/14/2021   Lab Results  Component Value Date   CHOL 92 09/14/2021   HDL 31.30 (L) 09/14/2021   LDLCALC 42 07/08/2020   LDLDIRECT 37.0 09/14/2021   TRIG 210.0 (H) 09/14/2021   CHOLHDL 3 09/14/2021    Lab Results  Component Value Date   HGBA1C 5.9 (H) 07/08/2020    Assessment & Plan   1.  Coronary artery disease status post CABG with stenting of the ostial vein graft to the RPAV and  RPDA, distal left main/ostial left circumflex in April 2022.  He continues to remain pain free.  No ischemic changes noted on EKG today.  He is to remain off of beta-blocker therapy due to first-degree AV block and right bundle branch block with previous history of bradycardia.  He is also continued on aspirin 81 mg daily, Lipitor 80 mg daily, Plavix 75 mg daily, and Imdur 30 mg daily.  He requires no refills to any of his medications today.  2.  Ischemic cardiomyopathy/HFimpEF with last LVEF of 55 to 60% in April 2022.  He continues to remain euvolemic on exam.  He also remains on lisinopril 2.5 mg daily.  3.  Hyperlipidemia with last LDL of 42 in 08/2021. He has been continued on atorvastatin 80 mg daily and omega 3 fatty acid bid. Continues to be followed by his PCP.   4.  Hypertension with blood pressure today 108/70.  Blood  pressure remained stable.  He is continued on Imdur 30 mg daily and lisinopril 2.5 mg daily.  5. Peripheral arterial disease without claudication symptoms.  Patient recently had hospitalization in 01/2022 where he underwent a CT angiogram which demonstrated occluded left common femoral artery with reconstitution distally, and had successful placement of kissing stents to the left iliac.  This was completed and followed by vascular surgery.  He is continued on Plavix, aspirin, and atorvastatin.  6.  Disposition patient return to clinic to see MD/APP in 6 months or sooner if needed.  Rashea Hoskie, NP 05/10/2022, 10:59 AM

## 2022-05-10 ENCOUNTER — Ambulatory Visit: Payer: PPO | Attending: Cardiology | Admitting: Cardiology

## 2022-05-10 ENCOUNTER — Encounter: Payer: Self-pay | Admitting: Cardiology

## 2022-05-10 VITALS — BP 108/70 | HR 61 | Ht 70.0 in | Wt 174.0 lb

## 2022-05-10 DIAGNOSIS — I255 Ischemic cardiomyopathy: Secondary | ICD-10-CM | POA: Diagnosis not present

## 2022-05-10 DIAGNOSIS — E785 Hyperlipidemia, unspecified: Secondary | ICD-10-CM

## 2022-05-10 DIAGNOSIS — I739 Peripheral vascular disease, unspecified: Secondary | ICD-10-CM

## 2022-05-10 DIAGNOSIS — I5032 Chronic diastolic (congestive) heart failure: Secondary | ICD-10-CM

## 2022-05-10 DIAGNOSIS — I10 Essential (primary) hypertension: Secondary | ICD-10-CM

## 2022-05-10 DIAGNOSIS — I2510 Atherosclerotic heart disease of native coronary artery without angina pectoris: Secondary | ICD-10-CM

## 2022-05-10 NOTE — Patient Instructions (Signed)
Medication Instructions:   Your physician recommends that you continue on your current medications as directed. Please refer to the Current Medication list given to you today.  *If you need a refill on your cardiac medications before your next appointment, please call your pharmacy*   Lab Work:  None Ordered  If you have labs (blood work) drawn today and your tests are completely normal, you will receive your results only by: [address] (if you have MyChart) OR A paper copy in the mail If you have any lab test that is abnormal or we need to change your treatment, we will call you to review the results.   Testing/Procedures:  None Ordered   Follow-Up: At [facility], you and your health needs are our priority.  As part of our continuing mission to provide you with exceptional heart care, we have created designated Provider Care Teams.  These Care Teams include your primary Cardiologist (physician) and Advanced Practice Providers (APPs -  Physician Assistants and Nurse Practitioners) who all work together to provide you with the care you need, when you need it.  We recommend signing up for the patient portal called "MyChart".  Sign up information is provided on this After Visit Summary.  MyChart is used to connect with patients for Virtual Visits (Telemedicine).  Patients are able to view lab/test results, encounter notes, upcoming appointments, etc.  Non-urgent messages can be sent to your provider as well.   To learn more about what you can do with MyChart, go to [email or web address].    Your next appointment:   6 month(s)  Provider:   You may see Kathlyn Sacramento, MD or one of the following Advanced Practice Providers on your designated Care Team:   Murray Hodgkins, NP Christell Faith, PA-C Cadence Kathlen Mody, PA-C Gerrie Nordmann, NP

## 2022-06-07 ENCOUNTER — Other Ambulatory Visit: Payer: Self-pay | Admitting: Cardiology

## 2022-06-07 ENCOUNTER — Other Ambulatory Visit: Payer: Self-pay | Admitting: Internal Medicine

## 2022-06-07 NOTE — Telephone Encounter (Signed)
Last filled 04-01-22 #30 Last OV 09-14-21 CPE Next OV 09-16-22 [facility]

## 2022-08-02 ENCOUNTER — Ambulatory Visit: Payer: PPO | Admitting: Cardiology

## 2022-08-14 ENCOUNTER — Other Ambulatory Visit: Payer: Self-pay | Admitting: Internal Medicine

## 2022-08-14 NOTE — Telephone Encounter (Signed)
Last filled 06-07-22 #30 Last OV 09-14-21 CPE Next OV 09-16-22 [facility]

## 2022-09-03 ENCOUNTER — Other Ambulatory Visit: Payer: Self-pay | Admitting: *Deleted

## 2022-09-03 DIAGNOSIS — I739 Peripheral vascular disease, unspecified: Secondary | ICD-10-CM

## 2022-09-03 DIAGNOSIS — I70222 Atherosclerosis of native arteries of extremities with rest pain, left leg: Secondary | ICD-10-CM

## 2022-09-13 ENCOUNTER — Ambulatory Visit (INDEPENDENT_AMBULATORY_CARE_PROVIDER_SITE_OTHER)
Admission: RE | Admit: 2022-09-13 | Discharge: 2022-09-13 | Disposition: A | Discharge status: Home / Self Care | Payer: PPO | Source: Ambulatory Visit | Attending: Vascular Surgery | Admitting: Vascular Surgery

## 2022-09-13 ENCOUNTER — Ambulatory Visit (HOSPITAL_COMMUNITY)
Admission: RE | Admit: 2022-09-13 | Discharge: 2022-09-13 | Disposition: A | Discharge status: Home / Self Care | Payer: PPO | Source: Ambulatory Visit | Attending: Vascular Surgery | Admitting: Vascular Surgery

## 2022-09-13 ENCOUNTER — Ambulatory Visit: Payer: PPO | Admitting: Physician Assistant

## 2022-09-13 VITALS — BP 126/78 | HR 52 | Temp 97.6°F | Resp 18 | Ht 70.0 in | Wt 169.0 lb

## 2022-09-13 DIAGNOSIS — I70222 Atherosclerosis of native arteries of extremities with rest pain, left leg: Secondary | ICD-10-CM

## 2022-09-13 DIAGNOSIS — I739 Peripheral vascular disease, unspecified: Secondary | ICD-10-CM | POA: Insufficient documentation

## 2022-09-13 LAB — VAS US ABI WITH/WO TBI
Left ABI: 1.04
Right ABI: 0.61

## 2022-09-13 NOTE — Progress Notes (Signed)
Office Note     CC:  follow up Requesting Provider:  [name], MD  HPI: FORDHAM MENEELY is a 72 y.o. (Jun 20, 1950) male who presents for surveillance of PAD.  He underwent aortogram with kissing common iliac artery stents by Dr. Karin Lieu on 02/13/2022 due to lifestyle limiting claudication of the left lower extremity.  Symptoms completely resolved postoperatively.  He continues to be without claudication symptoms.  He is also without rest pain or tissue loss of bilateral lower extremities.  He is on an aspirin, Plavix, and statin daily.  He occasionally smokes cigars when golfing.  He should be noted that he has mild claudication symptoms of the right calf after walking a long distance.  The symptoms do not affect his quality of life.   Past Medical History:  Diagnosis Date   Anemia    Atrial fibrillation (HCC) 01/13/2012   CAD (coronary artery disease)    a. 2001 s/p CABGx5 (LIMA->LAD, VG->D1, VG->D2, VG->RPAV->RPDA; b. 02/2015 NSTEMI/PCI: VG->D2 95 (3.6x16 Promus DES). VG->D1 100; c. 06/2020 PCI: LM 75, LAD 100ost, D1 70, D2 70, LCX 24m, RCA 100ost, RPAV 100, VG->RPAV->RPDA 80ost (4.5x12 Synergy DES), VG->D1 100, VG->D2 ok, LIMA->LAD ok; d. 07/2020 PCI LM/LCX (2.5x15 Resolute Onyx DES).   COPD (chronic obstructive pulmonary disease) (HCC)    ongoing tobacco use   GERD (gastroesophageal reflux disease)    HFimpEF (heart failure with improved ejection fraction) (HCC)    a. 12/2011 Echo: EF 35-40%; b. 06/2020 Echo: EF 55-60%   Ischemic cardiomyopathy 01/10/2012   a. 12/2011 Echo: EF 35-40%, mild conc LVH,  sev HK of anteroseptum, borderline RVH; b. 06/2020 Echo: EF 55-60%, no rwma, triv MR. Mild Ao sclerosis.   NSTEMI (non-ST elevated myocardial infarction) (HCC) 01/10/2012   Associated with ventricular fibrillation   OSA (obstructive sleep apnea)    a. 07/2020 WatchPat1: 1. Moderate Obstructive Sleep Apnea with AHI 16.5/hr. O2 sat down to 80%.    Past Surgical History:  Procedure  Laterality Date   ABDOMINAL AORTOGRAM W/LOWER EXTREMITY N/A 02/13/2022   Procedure: ABDOMINAL AORTOGRAM W/LOWER EXTREMITY;  Surgeon: Victorino Sparrow, MD;  Location: [facility] INVASIVE CV LAB;  Service: Cardiovascular;  Laterality: N/A;   CARDIAC CATHETERIZATION  2011   At [facility]   CARDIAC CATHETERIZATION  12/2011   prox LAD occlusion, ostial RCA occlusion, 30% prox LCx stenosis, LIMA-LAD: 80% post-anastamosis lesion, SVG-Dx2: old occlusion w/ thrombus (noted on prior 2011 cath at [facility]), SVG-Dx: patent, SVG-RCA: patent, diffuse irregs, 80-90% PDA/PLA lesions patent   CARDIAC CATHETERIZATION N/A 03/15/2015   Procedure: Left Heart Cath and Cors/Grafts Angiography;  Surgeon: Kathleene Hazel, MD;  Location: [facility] INVASIVE CV LAB;  Service: Cardiovascular;  Laterality: N/A;   CARDIAC CATHETERIZATION N/A 03/15/2015   Procedure: Coronary Stent Intervention;  Surgeon: Kathleene Hazel, MD;  Location: [facility] INVASIVE CV LAB;  Service: Cardiovascular;  Laterality: N/A;  svg to diagnoal 2   CORONARY ARTERY BYPASS GRAFT  2001   CORONARY IMAGING/OCT N/A 07/26/2020   Procedure: INTRAVASCULAR IMAGING/OCT;  Surgeon: Iran Ouch, MD;  Location: [facility] INVASIVE CV LAB;  Service: Cardiovascular;  Laterality: N/A;   CORONARY STENT INTERVENTION N/A 07/10/2020   Procedure: CORONARY STENT INTERVENTION;  Surgeon: Corky Crafts, MD;  Location: [facility] INVASIVE CV LAB;  Service: Cardiovascular;  Laterality: N/A;   CORONARY STENT INTERVENTION N/A 07/26/2020   Procedure: CORONARY STENT INTERVENTION;  Surgeon: Iran Ouch, MD;  Location: [facility] INVASIVE CV LAB;  Service: Cardiovascular;  Laterality: N/A;   CORONARY  ULTRASOUND/IVUS N/A 07/10/2020   Procedure: Intravascular Ultrasound/IVUS;  Surgeon: Corky Crafts, MD;  Location: [facility] INVASIVE CV LAB;  Service: Cardiovascular;  Laterality: N/A;   LEFT HEART CATH AND CORS/GRAFTS ANGIOGRAPHY N/A 07/10/2020   Procedure: LEFT HEART CATH AND CORS/GRAFTS ANGIOGRAPHY;   Surgeon: Corky Crafts, MD;  Location: [facility] INVASIVE CV LAB;  Service: Cardiovascular;  Laterality: N/A;   LEFT HEART CATHETERIZATION WITH CORONARY ANGIOGRAM N/A 01/10/2012   Procedure: LEFT HEART CATHETERIZATION WITH CORONARY ANGIOGRAM;  Surgeon: Lennette Bihari, MD;  Location: [facility] CATH LAB;  Service: Cardiovascular;  Laterality: N/A;    Social History   Socioeconomic History   Marital status: Married    Spouse name: Not on file   Number of children: 4   Years of education: Not on file   Highest education level: Not on file  Occupational History   Occupation: Management --now disabled   Occupation: Holiday representative work    Comment: Part--time  Tobacco Use   Smoking status: Former    Packs/day: 1.00    Years: 45.00    Additional pack years: 0.00    Total pack years: 45.00    Types: Cigarettes    Quit date: 03/13/2015    Years since quitting: 7.5    Passive exposure: Current (as a child)   Smokeless tobacco: Never  Vaping Use   Vaping Use: Never used  Substance and Sexual Activity   Alcohol use: Not Currently    Comment: really rare beer   Drug use: Yes    Types: Marijuana   Sexual activity: Not Currently  Other Topics Concern   Not on file  Social History Narrative   Married twice   3 children from 1st marriage-- 1 from second   Worked as Investment banker, operational      Has living will   Wife has health care POA---alternate would be daughter Judeth Cornfield   Would accept resuscitation--- but no prolonged ventilation   No tube feeds if cognitively unaware   Social Determinants of Health   Financial Resource Strain: Not on file  Food Insecurity: Not on file  Transportation Needs: Not on file  Physical Activity: Not on file  Stress: Not on file  Social Connections: Not on file  Intimate Partner Violence: Not on file    Family History  Problem Relation Age of Onset   Hyperlipidemia Mother    Alcohol abuse Father    Hyperlipidemia Father    Heart disease Sister     Hyperlipidemia Sister    Heart disease Brother    Hyperlipidemia Brother    Hyperlipidemia Sister    Diabetes Neg Hx    Hypertension Neg Hx     Current Outpatient Medications  Medication Sig Dispense Refill   aspirin EC 81 MG tablet Take 81 mg by mouth in the morning and at bedtime. Swallow whole.     atorvastatin (LIPITOR) 80 MG tablet TAKE ONE (1) TABLET BY MOUTH EACH EVENING AT 6PM 90 tablet 3   clonazePAM (KLONOPIN) 0.5 MG tablet TAKE 1/2 TO 1 TABLET BY MOUTH AT BEDTIME AS NEEDED FOR SLEEP/ANXIETY 30 tablet 0   clopidogrel (PLAVIX) 75 MG tablet TAKE ONE TABLET BY MOUTH EACH MORNING WITH BREAKFAST 90 tablet 0   isosorbide mononitrate (IMDUR) 30 MG 24 hr tablet TAKE 1 TABLET BY MOUTH EVERY DAY 90 tablet 3   lisinopril (ZESTRIL) 5 MG tablet TAKE 1/2 TABLET BY MOUTH EVERY DAY 45 tablet 3   Omega-3 Fatty Acids (FISH OIL) 1000 MG CAPS Take  1,000 mg by mouth in the morning and at bedtime.     pantoprazole (PROTONIX) 20 MG tablet TAKE 1 TABLET BY MOUTH EVERY DAY AS NEEDED 90 tablet 3   No current facility-administered medications for this visit.    Allergies  Allergen Reactions   Codeine Nausea And Vomiting     REVIEW OF SYSTEMS:   [X]  denotes positive finding, [ ]  denotes negative finding Cardiac  Comments:  Chest pain or chest pressure:    Shortness of breath upon exertion:    Short of breath when lying flat:    Irregular heart rhythm:        Vascular    Pain in calf, thigh, or hip brought on by ambulation:    Pain in feet at night that wakes you up from your sleep:     Blood clot in your veins:    Leg swelling:         Pulmonary    Oxygen at home:    Productive cough:     Wheezing:         Neurologic    Sudden weakness in arms or legs:     Sudden numbness in arms or legs:     Sudden onset of difficulty speaking or slurred speech:    Temporary loss of vision in one eye:     Problems with dizziness:         Gastrointestinal    Blood in stool:     Vomited blood:          Genitourinary    Burning when urinating:     Blood in urine:        Psychiatric    Major depression:         Hematologic    Bleeding problems:    Problems with blood clotting too easily:        Skin    Rashes or ulcers:        Constitutional    Fever or chills:      PHYSICAL EXAMINATION:  Vitals:   09/13/22 0926  BP: 126/78  Pulse: (!) 52  Resp: 18  Temp: 97.6 F (36.4 C)  TempSrc: Temporal  SpO2: 97%  Weight: 169 lb (76.7 kg)  Height: 5\' 10"  (1.778 m)    General:  WDWN in NAD; vital signs documented above Gait: Not observed HENT: WNL, normocephalic Pulmonary: normal non-labored breathing , without Rales, rhonchi,  wheezing Cardiac: regular HR Abdomen: soft, NT, no masses Skin: without rashes Vascular Exam/Pulses: Palpable left DP pulse; absent right pedal pulses Extremities: without ischemic changes, without Gangrene , without cellulitis; without open wounds;  Musculoskeletal: no muscle wasting or atrophy  Neurologic: A&O X 3 Psychiatric:  The pt has Normal affect.   Non-Invasive Vascular Imaging:   Widely patent common iliac stents   ABI/TBIToday's ABIToday's TBIPrevious ABIPrevious TBI  +-------+-----------+-----------+------------+------------+  Right 0.61       0.45       0.7         0.43          +-------+-----------+-----------+------------+------------+  Left  1.04       0.69       0.99        0.83            ASSESSMENT/PLAN:: 72 y.o. male here for surveillance of PAD with history of common iliac artery stents  -By duplex, iliac artery stents are widely patent bilaterally.  He has a normal ABI on the left.  He likely has infrainguinal occlusive disease on the right with absent pedal pulses.  His left DP pulses palpable.  He has mild claudication symptoms of the right calf however these do not affect his quality of life.  I encouraged him to walk is much as possible to promote collateral flow.  He will continue his aspirin,  Plavix, statin daily.  We will recheck aortoiliac duplex and ABIs in 1 year.   Emilie Rutter, PA-C [facility] [phone]  Clinic MD:   Karin Lieu

## 2022-09-16 ENCOUNTER — Encounter: Payer: PPO | Admitting: Internal Medicine

## 2022-09-16 ENCOUNTER — Ambulatory Visit
Admission: RE | Admit: 2022-09-16 | Discharge: 2022-09-16 | Disposition: A | Discharge status: Home / Self Care | Payer: PPO | Source: Ambulatory Visit | Attending: Internal Medicine | Admitting: Internal Medicine

## 2022-09-16 DIAGNOSIS — Z122 Encounter for screening for malignant neoplasm of respiratory organs: Secondary | ICD-10-CM | POA: Diagnosis not present

## 2022-09-16 DIAGNOSIS — Z87891 Personal history of nicotine dependence: Secondary | ICD-10-CM | POA: Insufficient documentation

## 2022-09-20 ENCOUNTER — Other Ambulatory Visit: Payer: Self-pay

## 2022-09-20 DIAGNOSIS — Z122 Encounter for screening for malignant neoplasm of respiratory organs: Secondary | ICD-10-CM

## 2022-09-20 DIAGNOSIS — Z87891 Personal history of nicotine dependence: Secondary | ICD-10-CM

## 2022-09-23 ENCOUNTER — Encounter: Payer: Self-pay | Admitting: Internal Medicine

## 2022-09-23 ENCOUNTER — Ambulatory Visit (INDEPENDENT_AMBULATORY_CARE_PROVIDER_SITE_OTHER): Payer: PPO | Admitting: Internal Medicine

## 2022-09-23 VITALS — BP 104/68 | HR 52 | Temp 97.1°F | Ht 67.25 in | Wt 168.0 lb

## 2022-09-23 DIAGNOSIS — Z1212 Encounter for screening for malignant neoplasm of rectum: Secondary | ICD-10-CM | POA: Diagnosis not present

## 2022-09-23 DIAGNOSIS — I25119 Atherosclerotic heart disease of native coronary artery with unspecified angina pectoris: Secondary | ICD-10-CM | POA: Diagnosis not present

## 2022-09-23 DIAGNOSIS — I739 Peripheral vascular disease, unspecified: Secondary | ICD-10-CM | POA: Diagnosis not present

## 2022-09-23 DIAGNOSIS — Z0000 Encounter for general adult medical examination without abnormal findings: Secondary | ICD-10-CM

## 2022-09-23 DIAGNOSIS — G2581 Restless legs syndrome: Secondary | ICD-10-CM

## 2022-09-23 DIAGNOSIS — K219 Gastro-esophageal reflux disease without esophagitis: Secondary | ICD-10-CM | POA: Diagnosis not present

## 2022-09-23 DIAGNOSIS — J449 Chronic obstructive pulmonary disease, unspecified: Secondary | ICD-10-CM | POA: Diagnosis not present

## 2022-09-23 LAB — COMPREHENSIVE METABOLIC PANEL
ALT: 15 U/L (ref 0–53)
AST: 15 U/L (ref 0–37)
Albumin: 4.1 g/dL (ref 3.5–5.2)
Alkaline Phosphatase: 170 U/L — ABNORMAL HIGH (ref 39–117)
BUN: 15 mg/dL (ref 6–23)
CO2: 28 mEq/L (ref 19–32)
Calcium: 9.2 mg/dL (ref 8.4–10.5)
Chloride: 104 mEq/L (ref 96–112)
Creatinine, Ser: 0.98 mg/dL (ref 0.40–1.50)
GFR: 77.51 mL/min (ref >60.00)
Glucose, Bld: 100 mg/dL — ABNORMAL HIGH (ref 70–99)
Potassium: 4.2 mEq/L (ref 3.5–5.1)
Sodium: 138 mEq/L (ref 135–145)
Total Bilirubin: 0.6 mg/dL (ref 0.2–1.2)
Total Protein: 7.2 g/dL (ref 6.0–8.3)

## 2022-09-23 LAB — CBC
HCT: 39 % (ref 39.0–52.0)
Hemoglobin: 12.9 g/dL — ABNORMAL LOW (ref 13.0–17.0)
MCHC: 33.1 g/dL (ref 30.0–36.0)
MCV: 91.5 fl (ref 78.0–100.0)
Platelets: 170 10*3/uL (ref 150.0–400.0)
RBC: 4.26 Mil/uL (ref 4.22–5.81)
RDW: 14.3 % (ref 11.5–15.5)
WBC: 7.4 10*3/uL (ref 4.0–10.5)

## 2022-09-23 LAB — LIPID PANEL
Cholesterol: 89 mg/dL (ref 0–200)
HDL: 26.9 mg/dL — ABNORMAL LOW (ref >39.00)
NonHDL: 61.97
Total CHOL/HDL Ratio: 3
Triglycerides: 252 mg/dL — ABNORMAL HIGH (ref 0.0–149.0)
VLDL: 50.4 mg/dL — ABNORMAL HIGH (ref 0.0–40.0)

## 2022-09-23 LAB — LDL CHOLESTEROL, DIRECT: Direct LDL: 35 mg/dL

## 2022-09-23 NOTE — Assessment & Plan Note (Signed)
Mild DOE now--better with rest No cigarettes now

## 2022-09-23 NOTE — Progress Notes (Signed)
Vision Screening   Right eye Left eye Both eyes  Without correction 20/40 20/40 20/25  With correction     Hearing Screening - Comments:: Passed whisper test  

## 2022-09-23 NOTE — Assessment & Plan Note (Addendum)
I have personally reviewed the Medicare Annual Wellness questionnaire and have noted 1. The patient's medical and social history 2. Their use of alcohol, tobacco or illicit drugs 3. Their current medications and supplements 4. The patient's functional ability including ADL's, fall risks, home safety risks and hearing or visual             impairment. 5. Diet and physical activities 6. Evidence for depression or mood disorders  The patients weight, height, BMI and visual acuity have been recorded in the chart I have made referrals, counseling and provided education to the patient based review of the above and I have provided the pt with a written personalized care plan for preventive services.  I have provided you with a copy of your personalized plan for preventive services. Please take the time to review along with your updated medication list.  Discussed increased exercise Td/RSV at the pharmacy Update COVID/flu in the fall Just handed in the FIT No PSA due to age

## 2022-09-23 NOTE — Assessment & Plan Note (Signed)
Uses the clonazepam prn 

## 2022-09-23 NOTE — Assessment & Plan Note (Signed)
Quiet on pantoprazole daily 

## 2022-09-23 NOTE — Progress Notes (Signed)
Subjective:    Patient ID: Franklin Marks, male    DOB: May 15, 1950, 72 y.o.   MRN: [id number]  HPI Here for Medicare wellness visit and follow up of chronic health conditions Reviewed advanced directives Reviewed other doctors---Dr Robins--vascular Did have angioplasty and one stent in legs---in November. 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 No regular exercise No falls No depression or anhedonia Independent with instrumental ADLs No memory problems  Had rapid improvement in claudication with the vascular procedure Still on ASA/plavix and statin No chest pain No palpitations No dizziness or syncope No edema  Does have SOB---fairly quickly  Recovers with rest (still able to play golf) Some cough No wheezing Reviewed his recent chest CT---no cancer but definite lung changes  Uses the pantoprazole daily No heartburn or dysphagia on this  Current Outpatient Medications on File Prior to Visit  Medication Sig Dispense Refill   aspirin EC 81 MG tablet Take 81 mg by mouth in the morning and at bedtime. Swallow whole.     atorvastatin (LIPITOR) 80 MG tablet TAKE ONE (1) TABLET BY MOUTH EACH EVENING AT 6PM 90 tablet 3   clonazePAM (KLONOPIN) 0.5 MG tablet TAKE 1/2 TO 1 TABLET BY MOUTH AT BEDTIME AS NEEDED FOR SLEEP/ANXIETY 30 tablet 0   clopidogrel (PLAVIX) 75 MG tablet TAKE ONE TABLET BY MOUTH EACH MORNING WITH BREAKFAST 90 tablet 0   isosorbide mononitrate (IMDUR) 30 MG 24 hr tablet TAKE 1 TABLET BY MOUTH EVERY DAY 90 tablet 3   lisinopril (ZESTRIL) 5 MG tablet TAKE 1/2 TABLET BY MOUTH EVERY DAY 45 tablet 3   Omega-3 Fatty Acids (FISH OIL) 1000 MG CAPS Take 1,000 mg by mouth in the morning and at bedtime.     pantoprazole (PROTONIX) 20 MG tablet TAKE 1 TABLET BY MOUTH EVERY DAY AS NEEDED 90  tablet 3   No current facility-administered medications on file prior to visit.    Allergies  Allergen Reactions   Codeine Nausea And Vomiting    Past Medical History:  Diagnosis Date   Anemia    Atrial fibrillation (HCC) 01/13/2012   CAD (coronary artery disease)    a. 2001 s/p CABGx5 (LIMA->LAD, VG->D1, VG->D2, VG->RPAV->RPDA; b. 02/2015 NSTEMI/PCI: VG->D2 95 (3.6x16 Promus DES). VG->D1 100; c. 06/2020 PCI: LM 75, LAD 100ost, D1 70, D2 70, LCX 26m, RCA 100ost, RPAV 100, VG->RPAV->RPDA 80ost (4.5x12 Synergy DES), VG->D1 100, VG->D2 ok, LIMA->LAD ok; d. 07/2020 PCI LM/LCX (2.5x15 Resolute Onyx DES).   COPD (chronic obstructive pulmonary disease) (HCC)    ongoing tobacco use   GERD (gastroesophageal reflux disease)    HFimpEF (heart failure with improved ejection fraction) (HCC)    a. 12/2011 Echo: EF 35-40%; b. 06/2020 Echo: EF 55-60%   Ischemic cardiomyopathy 01/10/2012   a. 12/2011 Echo: EF 35-40%, mild conc LVH,  sev HK of anteroseptum, borderline RVH; b. 06/2020 Echo: EF 55-60%, no rwma, triv MR. Mild Ao sclerosis.   NSTEMI (non-ST elevated myocardial infarction) (HCC) 01/10/2012   Associated with ventricular fibrillation   OSA (obstructive sleep apnea)    a. 07/2020 WatchPat1: 1. Moderate Obstructive Sleep Apnea with AHI 16.5/hr. O2 sat down to 80%.    Past Surgical History:  Procedure Laterality Date   ABDOMINAL AORTOGRAM W/LOWER EXTREMITY N/A 02/13/2022   Procedure: ABDOMINAL AORTOGRAM W/LOWER EXTREMITY;  Surgeon:  Victorino Sparrow, MD;  Location: [facility] INVASIVE CV LAB;  Service: Cardiovascular;  Laterality: N/A;   CARDIAC CATHETERIZATION  2011   At [facility]   CARDIAC CATHETERIZATION  12/2011   prox LAD occlusion, ostial RCA occlusion, 30% prox LCx stenosis, LIMA-LAD: 80% post-anastamosis lesion, SVG-Dx2: old occlusion w/ thrombus (noted on prior 2011 cath at [facility]), SVG-Dx: patent, SVG-RCA: patent, diffuse irregs, 80-90% PDA/PLA lesions patent   CARDIAC CATHETERIZATION N/A  03/15/2015   Procedure: Left Heart Cath and Cors/Grafts Angiography;  Surgeon: Kathleene Hazel, MD;  Location: [facility] INVASIVE CV LAB;  Service: Cardiovascular;  Laterality: N/A;   CARDIAC CATHETERIZATION N/A 03/15/2015   Procedure: Coronary Stent Intervention;  Surgeon: Kathleene Hazel, MD;  Location: [facility] INVASIVE CV LAB;  Service: Cardiovascular;  Laterality: N/A;  svg to diagnoal 2   CORONARY ARTERY BYPASS GRAFT  2001   CORONARY IMAGING/OCT N/A 07/26/2020   Procedure: INTRAVASCULAR IMAGING/OCT;  Surgeon: Iran Ouch, MD;  Location: [facility] INVASIVE CV LAB;  Service: Cardiovascular;  Laterality: N/A;   CORONARY STENT INTERVENTION N/A 07/10/2020   Procedure: CORONARY STENT INTERVENTION;  Surgeon: Corky Crafts, MD;  Location: [facility] INVASIVE CV LAB;  Service: Cardiovascular;  Laterality: N/A;   CORONARY STENT INTERVENTION N/A 07/26/2020   Procedure: CORONARY STENT INTERVENTION;  Surgeon: Iran Ouch, MD;  Location: [facility] INVASIVE CV LAB;  Service: Cardiovascular;  Laterality: N/A;   CORONARY ULTRASOUND/IVUS N/A 07/10/2020   Procedure: Intravascular Ultrasound/IVUS;  Surgeon: Corky Crafts, MD;  Location: [facility] INVASIVE CV LAB;  Service: Cardiovascular;  Laterality: N/A;   LEFT HEART CATH AND CORS/GRAFTS ANGIOGRAPHY N/A 07/10/2020   Procedure: LEFT HEART CATH AND CORS/GRAFTS ANGIOGRAPHY;  Surgeon: Corky Crafts, MD;  Location: [facility] INVASIVE CV LAB;  Service: Cardiovascular;  Laterality: N/A;   LEFT HEART CATHETERIZATION WITH CORONARY ANGIOGRAM N/A 01/10/2012   Procedure: LEFT HEART CATHETERIZATION WITH CORONARY ANGIOGRAM;  Surgeon: Lennette Bihari, MD;  Location: [facility] CATH LAB;  Service: Cardiovascular;  Laterality: N/A;    Family History  Problem Relation Age of Onset   Hyperlipidemia Mother    Alcohol abuse Father    Hyperlipidemia Father    Heart disease Sister    Hyperlipidemia Sister    Hyperlipidemia Sister    Heart disease Brother    Hyperlipidemia Brother     Heart disease Son    Diabetes Neg Hx    Hypertension Neg Hx     Social History   Socioeconomic History   Marital status: Married    Spouse name: Not on file   Number of children: 4   Years of education: Not on file   Highest education level: Not on file  Occupational History   Occupation: Management --now disabled   Occupation: Holiday representative work    Comment: Part--time  Tobacco Use   Smoking status: Former    Packs/day: 1.00    Years: 45.00    Additional pack years: 0.00    Total pack years: 45.00    Types: Cigarettes    Quit date: 03/13/2015    Years since quitting: 7.5    Passive exposure: Current (as a child)   Smokeless tobacco: Never  Vaping Use   Vaping Use: Never used  Substance and Sexual Activity   Alcohol use: Not Currently    Comment: really rare beer   Drug use: Yes    Types: Marijuana   Sexual activity: Not Currently  Other Topics Concern   Not on file  Social History Narrative   Married twice  3 children from 1st marriage-- 1 from second   Worked as Investment banker, operational      Has living will   Wife has health care POA---alternate would be daughter Judeth Cornfield   Would accept resuscitation--- but no prolonged ventilation   No tube feeds if cognitively unaware   Social Determinants of Health   Financial Resource Strain: Not on file  Food Insecurity: Not on file  Transportation Needs: Not on file  Physical Activity: Not on file  Stress: Not on file  Social Connections: Not on file  Intimate Partner Violence: Not on file   Review of Systems Appetite is fine Weight is stable Sleeps well in general---uses clonazepam rarely (for RLS) Full dentures--hasn't needed dentist No suspicious skin lesions--does bruise easily Wears seat belt Bowels move fine--no bleeding Voids okay---stream is okay. Nocturia x 1, rarely twice No sig back or joint pains    Objective:   Physical Exam Constitutional:      Appearance: Normal appearance.  HENT:      Mouth/Throat:     Pharynx: No oropharyngeal exudate or posterior oropharyngeal erythema.  Eyes:     Conjunctiva/sclera: Conjunctivae normal.     Pupils: Pupils are equal, round, and reactive to light.  Cardiovascular:     Rate and Rhythm: Normal rate and regular rhythm.     Heart sounds: No murmur heard.    No gallop.     Comments: Faint pulse on left Absent on right--but warm Pulmonary:     Effort: Pulmonary effort is normal.     Breath sounds: No wheezing or rales.     Comments: Decreased breath sounds but clear Abdominal:     Palpations: Abdomen is soft.     Tenderness: There is no abdominal tenderness.  Musculoskeletal:     Cervical back: Neck supple.     Right lower leg: No edema.     Left lower leg: No edema.  Lymphadenopathy:     Cervical: No cervical adenopathy.  Skin:    Findings: No lesion or rash.  Neurological:     General: No focal deficit present.     Mental Status: He is alert and oriented to person, place, and time.     Comments: Word naming 11/1 minute Recall 2/3  Psychiatric:        Mood and Affect: Mood normal.        Behavior: Behavior normal.            Assessment & Plan:

## 2022-09-23 NOTE — Assessment & Plan Note (Signed)
No chest pain on the isosorbide 30mg  DAPT--ASA/plavix Atorvastatin 80, lisinopril 5mg  daily

## 2022-09-23 NOTE — Assessment & Plan Note (Signed)
Symptoms gone since angioplasty/stent On vascular meds

## 2022-09-24 LAB — FECAL OCCULT BLOOD, IMMUNOCHEMICAL: Fecal Occult Blood: NEGATIVE

## 2022-09-28 ENCOUNTER — Other Ambulatory Visit: Payer: Self-pay

## 2022-09-28 DIAGNOSIS — I70222 Atherosclerosis of native arteries of extremities with rest pain, left leg: Secondary | ICD-10-CM

## 2022-09-28 DIAGNOSIS — I739 Peripheral vascular disease, unspecified: Secondary | ICD-10-CM

## 2022-10-27 ENCOUNTER — Other Ambulatory Visit: Payer: Self-pay | Admitting: Cardiology

## 2022-10-27 ENCOUNTER — Other Ambulatory Visit: Payer: Self-pay | Admitting: Cardiovascular Disease

## 2022-10-27 ENCOUNTER — Other Ambulatory Visit: Payer: Self-pay | Admitting: Medical

## 2022-10-27 ENCOUNTER — Other Ambulatory Visit: Payer: Self-pay | Admitting: Internal Medicine

## 2022-10-28 NOTE — Telephone Encounter (Signed)
Last visit 11/08/22 with plan to f/u 6 months.  Please contact pt to schedule.  Thanks.

## 2022-10-28 NOTE — Telephone Encounter (Signed)
Hi,  Could you schedule this patient a 6 month follow up appointment? The patient was last seen on 05-10-22. Thank you so much.

## 2022-10-28 NOTE — Telephone Encounter (Signed)
Last filled 08-14-22 #30 Last OV 09-23-22 Next OV 09-25-23 [facility]

## 2022-10-29 ENCOUNTER — Other Ambulatory Visit: Payer: Self-pay

## 2022-10-29 MED ORDER — CLOPIDOGREL BISULFATE 75 MG PO TABS: 75.0000 mg | ORAL_TABLET | Freq: Every day | ORAL | 0 refills | Status: DC

## 2022-11-08 NOTE — Telephone Encounter (Signed)
Left message to schedule 6 month follow up appointment

## 2022-12-22 ENCOUNTER — Emergency Department (HOSPITAL_COMMUNITY): Payer: PPO

## 2022-12-22 ENCOUNTER — Encounter (HOSPITAL_COMMUNITY): Payer: Self-pay | Admitting: Emergency Medicine

## 2022-12-22 ENCOUNTER — Observation Stay (HOSPITAL_COMMUNITY)
Admission: EM | Admit: 2022-12-22 | Discharge: 2022-12-23 | Disposition: A | Discharge status: Home / Self Care | Payer: PPO | Attending: Internal Medicine | Admitting: Internal Medicine

## 2022-12-22 ENCOUNTER — Other Ambulatory Visit: Payer: Self-pay

## 2022-12-22 DIAGNOSIS — J40 Bronchitis, not specified as acute or chronic: Secondary | ICD-10-CM | POA: Diagnosis not present

## 2022-12-22 DIAGNOSIS — R55 Syncope and collapse: Secondary | ICD-10-CM | POA: Diagnosis not present

## 2022-12-22 DIAGNOSIS — R110 Nausea: Secondary | ICD-10-CM | POA: Diagnosis not present

## 2022-12-22 DIAGNOSIS — I5030 Unspecified diastolic (congestive) heart failure: Secondary | ICD-10-CM | POA: Diagnosis not present

## 2022-12-22 DIAGNOSIS — Z79899 Other long term (current) drug therapy: Secondary | ICD-10-CM | POA: Diagnosis not present

## 2022-12-22 DIAGNOSIS — Z7902 Long term (current) use of antithrombotics/antiplatelets: Secondary | ICD-10-CM | POA: Diagnosis not present

## 2022-12-22 DIAGNOSIS — I110 Hypertensive heart disease with heart failure: Secondary | ICD-10-CM | POA: Diagnosis not present

## 2022-12-22 DIAGNOSIS — J449 Chronic obstructive pulmonary disease, unspecified: Secondary | ICD-10-CM | POA: Diagnosis not present

## 2022-12-22 DIAGNOSIS — R231 Pallor: Secondary | ICD-10-CM | POA: Diagnosis not present

## 2022-12-22 DIAGNOSIS — I25119 Atherosclerotic heart disease of native coronary artery with unspecified angina pectoris: Secondary | ICD-10-CM | POA: Diagnosis not present

## 2022-12-22 DIAGNOSIS — Z951 Presence of aortocoronary bypass graft: Secondary | ICD-10-CM | POA: Insufficient documentation

## 2022-12-22 DIAGNOSIS — Z87891 Personal history of nicotine dependence: Secondary | ICD-10-CM | POA: Insufficient documentation

## 2022-12-22 DIAGNOSIS — R42 Dizziness and giddiness: Secondary | ICD-10-CM | POA: Diagnosis not present

## 2022-12-22 DIAGNOSIS — K219 Gastro-esophageal reflux disease without esophagitis: Secondary | ICD-10-CM | POA: Diagnosis present

## 2022-12-22 DIAGNOSIS — Z1152 Encounter for screening for COVID-19: Secondary | ICD-10-CM | POA: Diagnosis not present

## 2022-12-22 DIAGNOSIS — I739 Peripheral vascular disease, unspecified: Secondary | ICD-10-CM | POA: Diagnosis present

## 2022-12-22 DIAGNOSIS — R531 Weakness: Secondary | ICD-10-CM | POA: Diagnosis not present

## 2022-12-22 DIAGNOSIS — Z7982 Long term (current) use of aspirin: Secondary | ICD-10-CM | POA: Insufficient documentation

## 2022-12-22 DIAGNOSIS — I255 Ischemic cardiomyopathy: Secondary | ICD-10-CM | POA: Diagnosis present

## 2022-12-22 LAB — BASIC METABOLIC PANEL
Anion gap: 9 (ref 5–15)
BUN: 18 mg/dL (ref 8–23)
CO2: 21 mmol/L — ABNORMAL LOW (ref 22–32)
Calcium: 8.6 mg/dL — ABNORMAL LOW (ref 8.9–10.3)
Chloride: 105 mmol/L (ref 98–111)
Creatinine, Ser: 1.11 mg/dL (ref 0.61–1.24)
GFR, Estimated: >60 mL/min (ref >60)
Glucose, Bld: 121 mg/dL — ABNORMAL HIGH (ref 70–99)
Potassium: 4.1 mmol/L (ref 3.5–5.1)
Sodium: 135 mmol/L (ref 135–145)

## 2022-12-22 LAB — CBC WITH DIFFERENTIAL/PLATELET
Abs Immature Granulocytes: 0.02 10*3/uL (ref 0.00–0.07)
Basophils Absolute: 0 10*3/uL (ref 0.0–0.1)
Basophils Relative: 0 %
Eosinophils Absolute: 0.1 10*3/uL (ref 0.0–0.5)
Eosinophils Relative: 1 %
HCT: 36.1 % — ABNORMAL LOW (ref 39.0–52.0)
Hemoglobin: 11.9 g/dL — ABNORMAL LOW (ref 13.0–17.0)
Immature Granulocytes: 0 %
Lymphocytes Relative: 19 %
Lymphs Abs: 1.6 10*3/uL (ref 0.7–4.0)
MCH: 30.8 pg (ref 26.0–34.0)
MCHC: 33 g/dL (ref 30.0–36.0)
MCV: 93.5 fL (ref 80.0–100.0)
Monocytes Absolute: 0.5 10*3/uL (ref 0.1–1.0)
Monocytes Relative: 6 %
Neutro Abs: 6 10*3/uL (ref 1.7–7.7)
Neutrophils Relative %: 74 %
Platelets: 159 10*3/uL (ref 150–400)
RBC: 3.86 MIL/uL — ABNORMAL LOW (ref 4.22–5.81)
RDW: 13.6 % (ref 11.5–15.5)
WBC: 8.3 10*3/uL (ref 4.0–10.5)
nRBC: 0 % (ref 0.0–0.2)

## 2022-12-22 LAB — SARS CORONAVIRUS 2 BY RT PCR: SARS Coronavirus 2 by RT PCR: NEGATIVE

## 2022-12-22 LAB — TROPONIN I (HIGH SENSITIVITY)
Troponin I (High Sensitivity): 8 ng/L (ref <18)
Troponin I (High Sensitivity): 8 ng/L (ref <18)

## 2022-12-22 MED ORDER — SODIUM CHLORIDE 0.9 % IV BOLUS
1000.0000 mL | Freq: Once | INTRAVENOUS | Status: AC
  Administered 2022-12-22: 1000 mL via INTRAVENOUS

## 2022-12-22 MED ORDER — ENOXAPARIN SODIUM 40 MG/0.4ML IJ SOSY
40.0000 mg | PREFILLED_SYRINGE | Freq: Every day | INTRAMUSCULAR | Status: DC
  Filled 2022-12-22: qty 0.4

## 2022-12-22 MED ORDER — SODIUM CHLORIDE 0.9% FLUSH
3.0000 mL | Freq: Two times a day (BID) | INTRAVENOUS | Status: DC
  Administered 2022-12-22: 3 mL via INTRAVENOUS

## 2022-12-22 MED ORDER — ONDANSETRON HCL 4 MG/2ML IJ SOLN: 4.0000 mg | Freq: Four times a day (QID) | INTRAMUSCULAR | Status: DC | PRN

## 2022-12-22 MED ORDER — ONDANSETRON HCL 4 MG PO TABS: 4.0000 mg | ORAL_TABLET | Freq: Four times a day (QID) | ORAL | Status: DC | PRN

## 2022-12-22 MED ORDER — SODIUM CHLORIDE 0.9 % IV SOLN: INTRAVENOUS | Status: DC

## 2022-12-22 NOTE — ED Notes (Signed)
Patient able to walk to restroom and back without assistance. Patient denies any dizziness or weakness at this time.

## 2022-12-22 NOTE — ED Triage Notes (Signed)
Pt here from home with c/o dizziness with a brief episode of nausea , b/p was found to be in the 90"s systolic , received 500 NS from ems no chest pain

## 2022-12-22 NOTE — ED Provider Notes (Signed)
[place] EMERGENCY DEPARTMENT AT [facility] Provider Note   CSN: [id number] Arrival date & time: 12/22/22  1651     History  Chief Complaint  Patient presents with   Dizziness    Franklin Marks is a 72 y.o. male with history of significant coronary disease status post CABG, peripheral arterial disease, chronic heart failure with improved EF, ischemic cardiomyopathy, smoking, COPD, hyperlipidemia, paroxysmal A-fib, presenting to the ED with complaint of near syncope.  Patient reports he has had 2 episodes in the past 2 weeks involving sudden lightheadedness and diaphoresis.  The second episode occurred this afternoon around 3 PM.  Patient reports he was at rest he began to feel extremely lightheaded, like he was going to lose consciousness.  He said he was diaphoretic and sweaty.  He laid down on the ground.  He denies chest pain or difficulty breathing.  He said this lasted about 30 minutes and then resolved.  He had a similar episode about a week ago.  He says he did not have any chest pain during these episodes, in comparison to when he had an MI in the past with significant chest pain.  Patient reports his blood pressure is typically "perfectly normal".  He says he does drink water daily although he cannot quantify how much  Patient is on aspirin and Plavix and takes his medications regularly.  He is also on atorvastatin and Imdur  EMS gave 500 cc IV fluids for BP in 90's systolic  HPI     Home Medications Prior to Admission medications   Medication Sig Start Date End Date Taking? Authorizing Provider  aspirin EC 81 MG tablet Take 81 mg by mouth daily. Swallow whole.   Yes [provider]  atorvastatin (LIPITOR) 80 MG tablet TAKE ONE (1) TABLET BY MOUTH EACH EVENING AT 6PM 10/28/22  Yes Hammock, Sheri, NP  clonazePAM (KLONOPIN) 0.5 MG tablet TAKE 1/2 TO 1 TABLET BY MOUTH AT BEDTIME AS NEEDED FOR SLEEP/ANXIETY 10/28/22  Yes [name], MD  clopidogrel  (PLAVIX) 75 MG tablet Take 1 tablet (75 mg total) by mouth daily. 10/29/22  Yes Iran Ouch, MD  isosorbide mononitrate (IMDUR) 30 MG 24 hr tablet Take 1 tablet (30 mg total) by mouth daily. PLEASE CALL OFFICE TO SCHEDULE APPOINTMENT PRIOR TO NEXT REFILL 10/28/22  Yes Iran Ouch, MD  lisinopril (ZESTRIL) 5 MG tablet TAKE 1/2 TABLET BY MOUTH EVERY DAY 02/04/22  Yes [name], MD  Omega-3 Fatty Acids (FISH OIL) 1000 MG CAPS Take 1,000 mg by mouth in the morning and at bedtime.   Yes [provider]  pantoprazole (PROTONIX) 20 MG tablet TAKE 1 TABLET BY MOUTH EVERY DAY AS NEEDED 10/28/22  Yes [name], MD      Allergies    Codeine    Review of Systems   Review of Systems  Physical Exam Updated Vital Signs BP 107/61   Pulse (!) 52   Temp 98 F (36.7 C) (Oral)   Resp 18   SpO2 94%  Physical Exam Constitutional:      General: He is not in acute distress. HENT:     Head: Normocephalic and atraumatic.  Eyes:     Conjunctiva/sclera: Conjunctivae normal.     Pupils: Pupils are equal, round, and reactive to light.  Cardiovascular:     Rate and Rhythm: Normal rate and regular rhythm.  Pulmonary:     Effort: Pulmonary effort is normal. No respiratory distress.  Abdominal:     General: There is no distension.     Tenderness: There is no abdominal tenderness.  Skin:    General: Skin is warm and dry.  Neurological:     General: No focal deficit present.     Mental Status: He is alert and oriented to person, place, and time. Mental status is at baseline.  Psychiatric:        Mood and Affect: Mood normal.        Behavior: Behavior normal.     ED Results / Procedures / Treatments   Labs (all labs ordered are listed, but only abnormal results are displayed) Labs Reviewed  BASIC METABOLIC PANEL - Abnormal; Notable for the following components:      Result Value   CO2 21 (*)    Glucose, Bld 121 (*)    Calcium 8.6 (*)    All other components within  normal limits  CBC WITH DIFFERENTIAL/PLATELET - Abnormal; Notable for the following components:   RBC 3.86 (*)    Hemoglobin 11.9 (*)    HCT 36.1 (*)    All other components within normal limits  SARS CORONAVIRUS 2 BY RT PCR  CBC  CREATININE, SERUM  TSH  COMPREHENSIVE METABOLIC PANEL  CBC  TROPONIN I (HIGH SENSITIVITY)  TROPONIN I (HIGH SENSITIVITY)    EKG EKG Interpretation Date/Time:  Sunday December 22 2022 17:02:01 EDT Ventricular Rate:  59 PR Interval:    QRS Duration:  149 QT Interval:  453 QTC Calculation: 449 R Axis:   63  Text Interpretation: SInus rhythm prolonged PR interval, RBBB chronic, occasional PVC Confirmed by Alvester Chou [phone]) on 12/22/2022 5:07:55 PM  Radiology DG Chest 2 View  Result Date: 12/22/2022 CLINICAL DATA:  Near syncope. EXAM: CHEST - 2 VIEW COMPARISON:  Chest radiograph dated 02/08/2022. FINDINGS: No focal consolidation, pleural effusion, or pneumothorax. There is diffuse chronic interstitial coarsening and bronchitic changes. The cardiac silhouette is within normal limits. Median sternotomy wires and CABG vascular clips. No acute osseous pathology. Old displaced fracture of the right clavicle. IMPRESSION: 1. No active cardiopulmonary disease. 2. Chronic interstitial changes. Electronically Signed   By: Elgie Collard M.D.   On: 12/22/2022 19:24    Procedures Procedures    Medications Ordered in ED Medications  sodium chloride flush (NS) 0.9 % injection 3 mL (has no administration in time range)  enoxaparin (LOVENOX) injection 40 mg (has no administration in time range)  0.9 %  sodium chloride infusion (has no administration in time range)  ondansetron (ZOFRAN) tablet 4 mg (has no administration in time range)    Or  ondansetron (ZOFRAN) injection 4 mg (has no administration in time range)  sodium chloride 0.9 % bolus 1,000 mL (0 mLs Intravenous Stopped 12/22/22 1929)    ED Course/ Medical Decision Making/ A&P Clinical Course as of  12/22/22 2321  Sun Dec 22, 2022  1959 I spoke to Dr Merrilee Jansky cardiology who agreed with plan for telemetry monitoring overnight, echocardiogram in the morning.  Hospitalist team should consult cardiology team in the morning regarding fitting the patient with Zio patch to go home with [MT]    Clinical Course User Index [MT] Avya Flavell, Kermit Balo, MD                                 Medical Decision Making Amount and/or Complexity of Data Reviewed Labs: ordered. Radiology: ordered.  Risk Decision regarding hospitalization.  This patient presents to the ED with concern for near syncope, diaphoresis. This involves an extensive number of treatment options, and is a complaint that carries with it a high risk of complications and morbidity.  The differential diagnosis includes arrhythmia versus anemia versus dehydration versus atypical ACS versus other  Co-morbidities that complicate the patient evaluation: History of significant cardiovascular disease and ischemic cardiomyopathy at high risk of arrhythmias and coronary syndrome  Additional history obtained from EMS  External records from outside source obtained and reviewed including cardiology office evaluation as noted above in history  I ordered and personally interpreted labs.  The pertinent results include: No emergent findings.  Initial troponin 8.  Repeat troponin pending at the time of admission.  I ordered imaging studies including x-ray of the chest I independently visualized and interpreted imaging which showed no emergent findings I agree with the radiologist interpretation  The patient was maintained on a cardiac monitor.  I personally viewed and interpreted the cardiac monitored which showed an underlying rhythm of: Sinus rhythm, occasional PVCs  Per my interpretation the patient's ECG shows sinus rhythm no acute ischemic findings  The patient was not having any active chest pain or discomfort not requiring medications for  chest pain at this time   I have reviewed the patients home medicines and have made adjustments as needed  Test Considered: Patient is not having any headache, strokelike symptoms, or history consistent with stroke.  I did not feel he needed emergent neuroimaging at this time.  After the interventions noted above, I reevaluated the patient and found that they have: improved -patient remains asymptomatic in the ED  Dispostion:  After consideration of the diagnostic results and the patients response to treatment, I feel that the patent would benefit from medical admission.         Final Clinical Impression(s) / ED Diagnoses Final diagnoses:  Near syncope    Rx / DC Orders ED Discharge Orders     None         Shatia Sindoni, Kermit Balo, MD 12/22/22 2321

## 2022-12-22 NOTE — H&P (Signed)
History and Physical    Patient: Franklin Marks ZOX:[id number] DOB: 1950-07-08 DOA: 12/22/2022 DOS: the patient was seen and examined on 12/22/2022 PCP: [name], MD  Patient coming from: Home  Chief Complaint:  Chief Complaint  Patient presents with   Dizziness   HPI: Franklin Marks is a 72 y.o. male with medical history significant of coronary artery disease status post CABG, peripheral arterial disease, chronic diastolic heart failure, ischemic cardiomyopathy, tobacco abuse, COPD exacerbation, hyperlipidemia, paroxysmal atrial fibrillation, who presents with presyncopal episode and dizziness.  He had the near syncope twice in the past 2 weeks.  It would be sudden lightheadedness and diaphoresis.  Denies any chest pain.  Denied any significant shortness of breath.  Patient's symptoms today lasted about 30 minutes and then resolved he is currently symptom-free.  He reported constantly and for a long time being bradycardic.  Heart rates in the 50s.  In the ER again heart rate was roughly around 50-60.  He was hypotensive when EMS arrived his place with systolic in the 90s and was given 500 cc bolus of normal saline.  At this point cardiology was consulted by the ER who recommends admission for echo, monitoring and possibly loop recorder at discharge if all tests are negative.  Review of Systems: As mentioned in the history of present illness. All other systems reviewed and are negative. Past Medical History:  Diagnosis Date   Anemia    Atrial fibrillation (HCC) 01/13/2012   CAD (coronary artery disease)    a. 2001 s/p CABGx5 (LIMA->LAD, VG->D1, VG->D2, VG->RPAV->RPDA; b. 02/2015 NSTEMI/PCI: VG->D2 95 (3.6x16 Promus DES). VG->D1 100; c. 06/2020 PCI: LM 75, LAD 100ost, D1 70, D2 70, LCX 9m, RCA 100ost, RPAV 100, VG->RPAV->RPDA 80ost (4.5x12 Synergy DES), VG->D1 100, VG->D2 ok, LIMA->LAD ok; d. 07/2020 PCI LM/LCX (2.5x15 Resolute Onyx DES).   COPD (chronic obstructive pulmonary disease)  (HCC)    ongoing tobacco use   GERD (gastroesophageal reflux disease)    HFimpEF (heart failure with improved ejection fraction) (HCC)    a. 12/2011 Echo: EF 35-40%; b. 06/2020 Echo: EF 55-60%   Ischemic cardiomyopathy 01/10/2012   a. 12/2011 Echo: EF 35-40%, mild conc LVH,  sev HK of anteroseptum, borderline RVH; b. 06/2020 Echo: EF 55-60%, no rwma, triv MR. Mild Ao sclerosis.   NSTEMI (non-ST elevated myocardial infarction) (HCC) 01/10/2012   Associated with ventricular fibrillation   OSA (obstructive sleep apnea)    a. 07/2020 WatchPat1: 1. Moderate Obstructive Sleep Apnea with AHI 16.5/hr. O2 sat down to 80%.   Past Surgical History:  Procedure Laterality Date   ABDOMINAL AORTOGRAM W/LOWER EXTREMITY N/A 02/13/2022   Procedure: ABDOMINAL AORTOGRAM W/LOWER EXTREMITY;  Surgeon: Victorino Sparrow, MD;  Location: [facility] INVASIVE CV LAB;  Service: Cardiovascular;  Laterality: N/A;   CARDIAC CATHETERIZATION  2011   At [facility]   CARDIAC CATHETERIZATION  12/2011   prox LAD occlusion, ostial RCA occlusion, 30% prox LCx stenosis, LIMA-LAD: 80% post-anastamosis lesion, SVG-Dx2: old occlusion w/ thrombus (noted on prior 2011 cath at [facility]), SVG-Dx: patent, SVG-RCA: patent, diffuse irregs, 80-90% PDA/PLA lesions patent   CARDIAC CATHETERIZATION N/A 03/15/2015   Procedure: Left Heart Cath and Cors/Grafts Angiography;  Surgeon: Kathleene Hazel, MD;  Location: [facility] INVASIVE CV LAB;  Service: Cardiovascular;  Laterality: N/A;   CARDIAC CATHETERIZATION N/A 03/15/2015   Procedure: Coronary Stent Intervention;  Surgeon: Kathleene Hazel, MD;  Location: [facility] INVASIVE CV LAB;  Service: Cardiovascular;  Laterality: N/A;  svg to diagnoal 2  CORONARY ARTERY BYPASS GRAFT  2001   CORONARY IMAGING/OCT N/A 07/26/2020   Procedure: INTRAVASCULAR IMAGING/OCT;  Surgeon: Iran Ouch, MD;  Location: [facility] INVASIVE CV LAB;  Service: Cardiovascular;  Laterality: N/A;   CORONARY STENT INTERVENTION N/A 07/10/2020    Procedure: CORONARY STENT INTERVENTION;  Surgeon: Corky Crafts, MD;  Location: [facility] INVASIVE CV LAB;  Service: Cardiovascular;  Laterality: N/A;   CORONARY STENT INTERVENTION N/A 07/26/2020   Procedure: CORONARY STENT INTERVENTION;  Surgeon: Iran Ouch, MD;  Location: [facility] INVASIVE CV LAB;  Service: Cardiovascular;  Laterality: N/A;   CORONARY ULTRASOUND/IVUS N/A 07/10/2020   Procedure: Intravascular Ultrasound/IVUS;  Surgeon: Corky Crafts, MD;  Location[facility] INVASIVE CV LAB;  Service: Cardiovascular;  Laterality: N/A;   LEFT HEART CATH AND CORS/GRAFTS ANGIOGRAPHY N/A 07/10/2020   Procedure: LEFT HEART CATH AND CORS/GRAFTS ANGIOGRAPHY;  Surgeon: Corky Crafts, MD;  Location: [facility] INVASIVE CV LAB;  Service: Cardiovascular;  Laterality: N/A;   LEFT HEART CATHETERIZATION WITH CORONARY ANGIOGRAM N/A 01/10/2012   Procedure: LEFT HEART CATHETERIZATION WITH CORONARY ANGIOGRAM;  Surgeon: Lennette Bihari, MD;  Location: [facility] CATH LAB;  Service: Cardiovascular;  Laterality: N/A;   Social History:  reports that he quit smoking about 7 years ago. His smoking use included cigarettes. He started smoking about 52 years ago. He has a 45 pack-year smoking history. He has been exposed to tobacco smoke. He has never used smokeless tobacco. He reports that he does not currently use alcohol. He reports current drug use. Drug: Marijuana.  Allergies  Allergen Reactions   Codeine Nausea And Vomiting    Family History  Problem Relation Age of Onset   Hyperlipidemia Mother    Alcohol abuse Father    Hyperlipidemia Father    Heart disease Sister    Hyperlipidemia Sister    Hyperlipidemia Sister    Heart disease Brother    Hyperlipidemia Brother    Heart disease Son    Diabetes Neg Hx    Hypertension Neg Hx     Prior to Admission medications   Medication Sig Start Date End Date Taking? Authorizing Provider  aspirin EC 81 MG tablet Take 81 mg by mouth in the morning and at bedtime. Swallow  whole.    [provider]  atorvastatin (LIPITOR) 80 MG tablet TAKE ONE (1) TABLET BY MOUTH EACH EVENING AT 6PM 10/28/22   Charlsie Quest, NP  clonazePAM (KLONOPIN) 0.5 MG tablet TAKE 1/2 TO 1 TABLET BY MOUTH AT BEDTIME AS NEEDED FOR SLEEP/ANXIETY 10/28/22   Tillman Abide I, MD  clopidogrel (PLAVIX) 75 MG tablet Take 1 tablet (75 mg total) by mouth daily. 10/29/22   Iran Ouch, MD  isosorbide mononitrate (IMDUR) 30 MG 24 hr tablet Take 1 tablet (30 mg total) by mouth daily. PLEASE CALL OFFICE TO SCHEDULE APPOINTMENT PRIOR TO NEXT REFILL 10/28/22   Iran Ouch, MD  lisinopril (ZESTRIL) 5 MG tablet TAKE 1/2 TABLET BY MOUTH EVERY DAY 02/04/22   [name], MD  Omega-3 Fatty Acids (FISH OIL) 1000 MG CAPS Take 1,000 mg by mouth in the morning and at bedtime.    [provider]  pantoprazole (PROTONIX) 20 MG tablet TAKE 1 TABLET BY MOUTH EVERY DAY AS NEEDED 10/28/22   [name], MD    Physical Exam: Vitals:   12/22/22 1700 12/22/22 1914  BP: 109/75 110/84  Pulse: 60 65  Resp: 17 16  Temp: 97.6 F (36.4 C)   TempSrc: Oral   SpO2: 94%  95%   Constitutional: NAD, calm, comfortable Eyes: PERRL, lids and conjunctivae normal ENMT: Mucous membranes are moist. Posterior pharynx clear of any exudate or lesions.Normal dentition.  Neck: normal, supple, no masses, no thyromegaly Respiratory: clear to auscultation bilaterally, no wheezing, no crackles. Normal respiratory effort. No accessory muscle use.  Cardiovascular: Regular rate and rhythm, no murmurs / rubs / gallops. No extremity edema. 2+ pedal pulses. No carotid bruits.  Abdomen: no tenderness, no masses palpated. No hepatosplenomegaly. Bowel sounds positive.  Musculoskeletal: Good range of motion, no joint swelling or tenderness, Skin: no rashes, lesions, ulcers. No induration Neurologic: CN 2-12 grossly intact. Sensation intact, DTR normal. Strength 5/5 in all 4.  Psychiatric: Normal judgment and  insight. Alert and oriented x 3. Normal mood  Data Reviewed:  Heart rate is 52, white count is 8.3, CO2 of 21 hemoglobin 11.9 chest x-ray showed no acute findings EKG showed sinus rhythm with no significant changes  Assessment and Plan:  #1 presyncope: More than likely secondary to some form of arrhythmias.  He is already bradycardic.  Will admit the patient for observation.  Monitor him on telemetry.  Check serial enzymes.  Echocardiogram in the morning.  Cardiology will be consulted for possible loop recorder at discharge.  #2 coronary artery disease: Status post CABG.  Continue monitoring.  #3 essential hypertension: Blood pressure is controlled.  Continue home regimen.  #4 COPD: No acute exacerbation  #5 peripheral arterial disease: Confirm on resume home regimen  #6 GERD: Continue with PPIs.    Advance Care Planning:   Code Status: Prior full code  Consults: Cardiology fellow  Family Communication: No family at bedside  Severity of Illness: The appropriate patient status for this patient is OBSERVATION. Observation status is judged to be reasonable and necessary in order to provide the required intensity of service to ensure the patient's safety. The patient's presenting symptoms, physical exam findings, and initial radiographic and laboratory data in the context of their medical condition is felt to place them at decreased risk for further clinical deterioration. Furthermore, it is anticipated that the patient will be medically stable for discharge from the hospital within 2 midnights of admission.   AuthorLonia Blood, MD 12/22/2022 8:51 PM  For on call review [email or web address].

## 2022-12-22 NOTE — ED Triage Notes (Signed)
Cbg 121 

## 2022-12-23 ENCOUNTER — Observation Stay (HOSPITAL_BASED_OUTPATIENT_CLINIC_OR_DEPARTMENT_OTHER): Payer: PPO

## 2022-12-23 ENCOUNTER — Other Ambulatory Visit: Payer: Self-pay | Admitting: Student

## 2022-12-23 DIAGNOSIS — R001 Bradycardia, unspecified: Secondary | ICD-10-CM

## 2022-12-23 DIAGNOSIS — R55 Syncope and collapse: Secondary | ICD-10-CM

## 2022-12-23 LAB — COMPREHENSIVE METABOLIC PANEL
ALT: 15 U/L (ref 0–44)
AST: 16 U/L (ref 15–41)
Albumin: 3.1 g/dL — ABNORMAL LOW (ref 3.5–5.0)
Alkaline Phosphatase: 127 U/L — ABNORMAL HIGH (ref 38–126)
Anion gap: 11 (ref 5–15)
BUN: 15 mg/dL (ref 8–23)
CO2: 22 mmol/L (ref 22–32)
Calcium: 8.5 mg/dL — ABNORMAL LOW (ref 8.9–10.3)
Chloride: 106 mmol/L (ref 98–111)
Creatinine, Ser: 0.93 mg/dL (ref 0.61–1.24)
GFR, Estimated: >60 mL/min (ref >60)
Glucose, Bld: 129 mg/dL — ABNORMAL HIGH (ref 70–99)
Potassium: 3.6 mmol/L (ref 3.5–5.1)
Sodium: 139 mmol/L (ref 135–145)
Total Bilirubin: 0.3 mg/dL (ref 0.3–1.2)
Total Protein: 5.9 g/dL — ABNORMAL LOW (ref 6.5–8.1)

## 2022-12-23 LAB — ECHOCARDIOGRAM COMPLETE
AR max vel: 2.49 cm2
AV Area VTI: 2.32 cm2
AV Area mean vel: 2.46 cm2
AV Mean grad: 4 mm[Hg]
AV Peak grad: 9.5 mm[Hg]
Ao pk vel: 1.54 m/s
Area-P 1/2: 2.48 cm2
Height: 69 in
S' Lateral: 3.3 cm
Weight: 2811.31 [oz_av]

## 2022-12-23 LAB — CBC
HCT: 32.7 % — ABNORMAL LOW (ref 39.0–52.0)
Hemoglobin: 11 g/dL — ABNORMAL LOW (ref 13.0–17.0)
MCH: 31.8 pg (ref 26.0–34.0)
MCHC: 33.6 g/dL (ref 30.0–36.0)
MCV: 94.5 fL (ref 80.0–100.0)
Platelets: 134 10*3/uL — ABNORMAL LOW (ref 150–400)
RBC: 3.46 MIL/uL — ABNORMAL LOW (ref 4.22–5.81)
RDW: 13.7 % (ref 11.5–15.5)
WBC: 6.5 10*3/uL (ref 4.0–10.5)
nRBC: 0 % (ref 0.0–0.2)

## 2022-12-23 LAB — TSH: TSH: 1.201 u[IU]/mL (ref 0.350–4.500)

## 2022-12-23 LAB — CBG MONITORING, ED: Glucose-Capillary: 92 mg/dL (ref 70–99)

## 2022-12-23 MED ORDER — INFLUENZA VAC A&B SURF ANT ADJ 0.5 ML IM SUSY
0.5000 mL | PREFILLED_SYRINGE | INTRAMUSCULAR | Status: DC
  Filled 2022-12-23: qty 0.5

## 2022-12-23 MED ORDER — CLONAZEPAM 0.5 MG PO TABS
0.5000 mg | ORAL_TABLET | Freq: Every day | ORAL | Status: DC
  Administered 2022-12-23: 0.5 mg via ORAL
  Filled 2022-12-23: qty 1

## 2022-12-23 MED ORDER — OMEGA-3-ACID ETHYL ESTERS 1 G PO CAPS
1.0000 g | ORAL_CAPSULE | Freq: Every day | ORAL | Status: DC
  Administered 2022-12-23: 1 g via ORAL
  Filled 2022-12-23: qty 1

## 2022-12-23 MED ORDER — ATORVASTATIN CALCIUM 80 MG PO TABS
80.0000 mg | ORAL_TABLET | Freq: Every day | ORAL | Status: DC
  Administered 2022-12-23: 80 mg via ORAL
  Filled 2022-12-23: qty 2

## 2022-12-23 MED ORDER — ISOSORBIDE MONONITRATE ER 30 MG PO TB24
30.0000 mg | ORAL_TABLET | Freq: Every day | ORAL | Status: DC
  Administered 2022-12-23: 30 mg via ORAL
  Filled 2022-12-23: qty 1

## 2022-12-23 MED ORDER — CLOPIDOGREL BISULFATE 75 MG PO TABS
75.0000 mg | ORAL_TABLET | Freq: Every day | ORAL | Status: DC
  Administered 2022-12-23: 75 mg via ORAL
  Filled 2022-12-23: qty 1

## 2022-12-23 MED ORDER — LISINOPRIL 5 MG PO TABS
2.5000 mg | ORAL_TABLET | Freq: Every day | ORAL | Status: DC
  Administered 2022-12-23: 2.5 mg via ORAL
  Filled 2022-12-23: qty 1

## 2022-12-23 MED ORDER — INFLUENZA VAC A&B SURF ANT ADJ 0.5 ML IM SUSY: 0.5000 mL | PREFILLED_SYRINGE | INTRAMUSCULAR | Status: DC

## 2022-12-23 MED ORDER — ASPIRIN 81 MG PO TBEC
81.0000 mg | DELAYED_RELEASE_TABLET | Freq: Every day | ORAL | Status: DC
  Administered 2022-12-23: 81 mg via ORAL
  Filled 2022-12-23: qty 1

## 2022-12-23 MED ORDER — PANTOPRAZOLE SODIUM 20 MG PO TBEC: 20.0000 mg | DELAYED_RELEASE_TABLET | Freq: Every day | ORAL | Status: DC | PRN

## 2022-12-23 NOTE — ED Notes (Signed)
Pt has been up ambulating to the bathroom and stretching his legs without any symptoms of dizziness.  He states he feels much better.

## 2022-12-23 NOTE — ED Notes (Signed)
ED TO INPATIENT HANDOFF REPORT  ED Nurse Name and Phone #: Lenell Antu Name/Age/Gender Franklin Marks 72 y.o. male Room/Bed: 045C/045C  Code Status   Code Status: Full Code  Home/SNF/Other Home Patient oriented to: self, place, time, and situation Is this baseline? Yes   Triage Complete: Triage complete  Chief Complaint Syncope and collapse [R55]  Triage Note Pt here from home with c/o dizziness with a brief episode of nausea , b/p was found to be in the 90"s systolic , received 500 NS from ems no chest pain   Cbg 121    Allergies Allergies  Allergen Reactions   Codeine Nausea And Vomiting    Level of Care/Admitting Diagnosis ED Disposition     ED Disposition  Admit   Condition  --   Comment  Hospital Area: [facility] [[id number]]  Level of Care: Telemetry Cardiac [103]  May place patient in observation at [facility] or [facility] if equivalent level of care is available:: No  Covid Evaluation: Asymptomatic - no recent exposure (last 10 days) testing not required  Diagnosis: Syncope and collapse [780.2.ICD-9-CM]  Admitting Physician: Rometta Emery [[id number]]  Attending Physician: Rometta Emery [[id number]]          B Medical/Surgery History Past Medical History:  Diagnosis Date   Anemia    Atrial fibrillation (HCC) 01/13/2012   CAD (coronary artery disease)    a. 2001 s/p CABGx5 (LIMA->LAD, VG->D1, VG->D2, VG->RPAV->RPDA; b. 02/2015 NSTEMI/PCI: VG->D2 95 (3.6x16 Promus DES). VG->D1 100; c. 06/2020 PCI: LM 75, LAD 100ost, D1 70, D2 70, LCX 39m, RCA 100ost, RPAV 100, VG->RPAV->RPDA 80ost (4.5x12 Synergy DES), VG->D1 100, VG->D2 ok, LIMA->LAD ok; d. 07/2020 PCI LM/LCX (2.5x15 Resolute Onyx DES).   COPD (chronic obstructive pulmonary disease) (HCC)    ongoing tobacco use   GERD (gastroesophageal reflux disease)    HFimpEF (heart failure with improved ejection fraction) (HCC)    a. 12/2011 Echo: EF 35-40%; b. 06/2020 Echo: EF 55-60%   Ischemic  cardiomyopathy 01/10/2012   a. 12/2011 Echo: EF 35-40%, mild conc LVH,  sev HK of anteroseptum, borderline RVH; b. 06/2020 Echo: EF 55-60%, no rwma, triv MR. Mild Ao sclerosis.   NSTEMI (non-ST elevated myocardial infarction) (HCC) 01/10/2012   Associated with ventricular fibrillation   OSA (obstructive sleep apnea)    a. 07/2020 WatchPat1: 1. Moderate Obstructive Sleep Apnea with AHI 16.5/hr. O2 sat down to 80%.   Past Surgical History:  Procedure Laterality Date   ABDOMINAL AORTOGRAM W/LOWER EXTREMITY N/A 02/13/2022   Procedure: ABDOMINAL AORTOGRAM W/LOWER EXTREMITY;  Surgeon: Victorino Sparrow, MD;  Location: [facility] INVASIVE CV LAB;  Service: Cardiovascular;  Laterality: N/A;   CARDIAC CATHETERIZATION  2011   At [facility]   CARDIAC CATHETERIZATION  12/2011   prox LAD occlusion, ostial RCA occlusion, 30% prox LCx stenosis, LIMA-LAD: 80% post-anastamosis lesion, SVG-Dx2: old occlusion w/ thrombus (noted on prior 2011 cath at [facility]), SVG-Dx: patent, SVG-RCA: patent, diffuse irregs, 80-90% PDA/PLA lesions patent   CARDIAC CATHETERIZATION N/A 03/15/2015   Procedure: Left Heart Cath and Cors/Grafts Angiography;  Surgeon: Kathleene Hazel, MD;  Location: [facility] INVASIVE CV LAB;  Service: Cardiovascular;  Laterality: N/A;   CARDIAC CATHETERIZATION N/A 03/15/2015   Procedure: Coronary Stent Intervention;  Surgeon: Kathleene Hazel, MD;  Location: [facility] INVASIVE CV LAB;  Service: Cardiovascular;  Laterality: N/A;  svg to diagnoal 2   CORONARY ARTERY BYPASS GRAFT  2001   CORONARY IMAGING/OCT N/A 07/26/2020   Procedure: INTRAVASCULAR  IMAGING/OCT;  Surgeon: Iran Ouch, MD;  Location: [facility] INVASIVE CV LAB;  Service: Cardiovascular;  Laterality: N/A;   CORONARY STENT INTERVENTION N/A 07/10/2020   Procedure: CORONARY STENT INTERVENTION;  Surgeon: Corky Crafts, MD;  Location: [facility] INVASIVE CV LAB;  Service: Cardiovascular;  Laterality: N/A;   CORONARY STENT INTERVENTION N/A 07/26/2020   Procedure:  CORONARY STENT INTERVENTION;  Surgeon: Iran Ouch, MD;  Location: [facility] INVASIVE CV LAB;  Service: Cardiovascular;  Laterality: N/A;   CORONARY ULTRASOUND/IVUS N/A 07/10/2020   Procedure: Intravascular Ultrasound/IVUS;  Surgeon: Corky Crafts, MD;  Location: [facility] INVASIVE CV LAB;  Service: Cardiovascular;  Laterality: N/A;   LEFT HEART CATH AND CORS/GRAFTS ANGIOGRAPHY N/A 07/10/2020   Procedure: LEFT HEART CATH AND CORS/GRAFTS ANGIOGRAPHY;  Surgeon: Corky Crafts, MD;  Location: [facility] INVASIVE CV LAB;  Service: Cardiovascular;  Laterality: N/A;   LEFT HEART CATHETERIZATION WITH CORONARY ANGIOGRAM N/A 01/10/2012   Procedure: LEFT HEART CATHETERIZATION WITH CORONARY ANGIOGRAM;  Surgeon: Lennette Bihari, MD;  Location: [facility] CATH LAB;  Service: Cardiovascular;  Laterality: N/A;     A IV Location/Drains/Wounds Patient Lines/Drains/Airways Status     Active Line/Drains/Airways     Name Placement date Placement time Site Days   Peripheral IV 12/22/22 20 G Left Antecubital 12/22/22  1659  Antecubital  1            Intake/Output Last 24 hours  Intake/Output Summary (Last 24 hours) at 12/23/2022 1239 Last data filed at 12/22/2022 2337 Gross per 24 hour  Intake 1003 ml  Output --  Net 1003 ml    Labs/Imaging Results for orders placed or performed during the hospital encounter of 12/22/22 (from the past 48 hour(s))  Basic metabolic panel     Status: Abnormal   Collection Time: 12/22/22  5:44 PM  Result Value Ref Range   Sodium 135 135 - 145 mmol/L   Potassium 4.1 3.5 - 5.1 mmol/L   Chloride 105 98 - 111 mmol/L   CO2 21 (L) 22 - 32 mmol/L   Glucose, Bld 121 (H) 70 - 99 mg/dL    Comment: Glucose reference range applies only to samples taken after fasting for at least 8 hours.   BUN 18 8 - 23 mg/dL   Creatinine, Ser 5.95 0.61 - 1.24 mg/dL   Calcium 8.6 (L) 8.9 - 10.3 mg/dL   GFR, Estimated >63 >87 mL/min    Comment: (NOTE) Calculated using the CKD-EPI Creatinine Equation  (2021)    Anion gap 9 5 - 15    Comment: Performed at [facility], [address], [place], [place] [address]  CBC with Differential     Status: Abnormal   Collection Time: 12/22/22  5:44 PM  Result Value Ref Range   WBC 8.3 4.0 - 10.5 K/uL   RBC 3.86 (L) 4.22 - 5.81 MIL/uL   Hemoglobin 11.9 (L) 13.0 - 17.0 g/dL   HCT 29.5 (L) 18.8 - 41.6 %   MCV 93.5 80.0 - 100.0 fL   MCH 30.8 26.0 - 34.0 pg   MCHC 33.0 30.0 - 36.0 g/dL   RDW 60.6 30.1 - 60.1 %   Platelets 159 150 - 400 K/uL   nRBC 0.0 0.0 - 0.2 %   Neutrophils Relative % 74 %   Neutro Abs 6.0 1.7 - 7.7 K/uL   Lymphocytes Relative 19 %   Lymphs Abs 1.6 0.7 - 4.0 K/uL   Monocytes Relative 6 %   Monocytes Absolute 0.5 0.1 -  1.0 K/uL   Eosinophils Relative 1 %   Eosinophils Absolute 0.1 0.0 - 0.5 K/uL   Basophils Relative 0 %   Basophils Absolute 0.0 0.0 - 0.1 K/uL   Immature Granulocytes 0 %   Abs Immature Granulocytes 0.02 0.00 - 0.07 K/uL    Comment: Performed at [facility], [address], [place], [place] [address]  Troponin I (High Sensitivity)     Status: None   Collection Time: 12/22/22  5:44 PM  Result Value Ref Range   Troponin I (High Sensitivity) 8 <18 ng/L    Comment: (NOTE) Elevated high sensitivity troponin I (hsTnI) values and significant  changes across serial measurements may suggest ACS but many other  chronic and acute conditions are known to elevate hsTnI results.  Refer to the "Links" section for chest pain algorithms and additional  guidance. Performed at [facility], [address] [place], [place] [address]   SARS Coronavirus 2 by RT PCR (hospital order, performed in [facility] hospital lab) *cepheid single result test* Anterior Nasal Swab     Status: None   Collection Time: 12/22/22  5:44 PM   Specimen: Anterior Nasal Swab  Result Value Ref Range   SARS Coronavirus 2 by RT PCR NEGATIVE NEGATIVE    Comment: Performed at [facility], [address], [place],  [place] [address]  Troponin I (High Sensitivity)     Status: None   Collection Time: 12/22/22  8:59 PM  Result Value Ref Range   Troponin I (High Sensitivity) 8 <18 ng/L    Comment: (NOTE) Elevated high sensitivity troponin I (hsTnI) values and significant  changes across serial measurements may suggest ACS but many other  chronic and acute conditions are known to elevate hsTnI results.  Refer to the "Links" section for chest pain algorithms and additional  guidance. Performed at [facility], [address], [place], [place] [address]   TSH     Status: None   Collection Time: 12/22/22 11:36 PM  Result Value Ref Range   TSH 1.201 0.350 - 4.500 uIU/mL    Comment: Performed by a 3rd Generation assay with a functional sensitivity of <=0.01 uIU/mL. Performed at [facility], [address] [place], [place] [address]   Comprehensive metabolic panel     Status: Abnormal   Collection Time: 12/22/22 11:36 PM  Result Value Ref Range   Sodium 139 135 - 145 mmol/L   Potassium 3.6 3.5 - 5.1 mmol/L   Chloride 106 98 - 111 mmol/L   CO2 22 22 - 32 mmol/L   Glucose, Bld 129 (H) 70 - 99 mg/dL    Comment: Glucose reference range applies only to samples taken after fasting for at least 8 hours.   BUN 15 8 - 23 mg/dL   Creatinine, Ser 1.30 0.61 - 1.24 mg/dL   Calcium 8.5 (L) 8.9 - 10.3 mg/dL   Total Protein 5.9 (L) 6.5 - 8.1 g/dL   Albumin 3.1 (L) 3.5 - 5.0 g/dL   AST 16 15 - 41 U/L   ALT 15 0 - 44 U/L   Alkaline Phosphatase 127 (H) 38 - 126 U/L   Total Bilirubin 0.3 0.3 - 1.2 mg/dL   GFR, Estimated >86 >57 mL/min    Comment: (NOTE) Calculated using the CKD-EPI Creatinine Equation (2021)    Anion gap 11 5 - 15    Comment: Performed at [facility], [address], [place], [place]  [address]  CBC     Status: Abnormal   Collection Time: 12/22/22 11:36 PM  Result Value Ref Range   WBC 6.5 4.0 - 10.5 K/uL   RBC 3.46 (L) 4.22 - 5.81 MIL/uL   Hemoglobin 11.0 (L) 13.0 - 17.0 g/dL    HCT 19.1 (L) 47.8 - 52.0 %   MCV 94.5 80.0 - 100.0 fL   MCH 31.8 26.0 - 34.0 pg   MCHC 33.6 30.0 - 36.0 g/dL   RDW 29.5 62.1 - 30.8 %   Platelets 134 (L) 150 - 400 K/uL   nRBC 0.0 0.0 - 0.2 %    Comment: Performed at [facility], [address], [place], [place] [address]  CBG monitoring, ED     Status: None   Collection Time: 12/23/22  7:08 AM  Result Value Ref Range   Glucose-Capillary 92 70 - 99 mg/dL    Comment: Glucose reference range applies only to samples taken after fasting for at least 8 hours.   DG Chest 2 View  Result Date: 12/22/2022 CLINICAL DATA:  Near syncope. EXAM: CHEST - 2 VIEW COMPARISON:  Chest radiograph dated 02/08/2022. FINDINGS: No focal consolidation, pleural effusion, or pneumothorax. There is diffuse chronic interstitial coarsening and bronchitic changes. The cardiac silhouette is within normal limits. Median sternotomy wires and CABG vascular clips. No acute osseous pathology. Old displaced fracture of the right clavicle. IMPRESSION: 1. No active cardiopulmonary disease. 2. Chronic interstitial changes. Electronically Signed   By: Elgie Collard M.D.   On: 12/22/2022 19:24    Pending Labs Unresulted Labs (From admission, onward)     Start     Ordered   12/29/22 0500  Creatinine, serum  (enoxaparin (LOVENOX)    CrCl >/= 30 ml/min)  Weekly,   R     Comments: while on enoxaparin therapy    12/22/22 2313            Vitals/Pain Today's Vitals   12/23/22 1130 12/23/22 1145 12/23/22 1157 12/23/22 1200  BP:   (!) 147/79 (!) 142/100  Pulse: (!) 41 (!) 51  (!) 53  Resp: (!) 21 (!) 22  12  Temp:      TempSrc:      SpO2: 96% 94%  96%  PainSc:        Isolation Precautions No active isolations  Medications Medications  sodium chloride flush (NS) 0.9 % injection 3 mL (3 mLs Intravenous Not Given 12/23/22 1200)  enoxaparin (LOVENOX) injection 40 mg (40 mg Subcutaneous Not Given 12/23/22 1200)  0.9 %  sodium chloride infusion ( Intravenous New  Bag/Given 12/22/22 2337)  ondansetron (ZOFRAN) tablet 4 mg (has no administration in time range)    Or  ondansetron (ZOFRAN) injection 4 mg (has no administration in time range)  lisinopril (ZESTRIL) tablet 2.5 mg (2.5 mg Oral Given 12/23/22 1157)  aspirin EC tablet 81 mg (81 mg Oral Given 12/23/22 1158)  clonazePAM (KLONOPIN) tablet 0.5 mg (0.5 mg Oral Given 12/23/22 0105)  pantoprazole (PROTONIX) EC tablet 20 mg (has no administration in time range)  isosorbide mononitrate (IMDUR) 24 hr tablet 30 mg (30 mg Oral Given 12/23/22 1158)  atorvastatin (LIPITOR) tablet 80 mg (80 mg Oral Given 12/23/22 1159)  clopidogrel (PLAVIX) tablet 75 mg (75 mg Oral Given 12/23/22 1159)  omega-3 acid ethyl esters (LOVAZA) capsule 1 g (1 g Oral Given 12/23/22 1158)  influenza vaccine adjuvanted (FLUAD) injection 0.5 mL (has no administration in time range)  sodium chloride 0.9 % bolus  1,000 mL (0 mLs Intravenous Stopped 12/22/22 1929)    Mobility walks     Focused Assessments     R Recommendations: See Admitting Provider Note  Report given to:   Additional Notes: Pt has been up ambulating to the bathroom and stretching his legs without any symptoms of dizziness.  He states he feels much better.

## 2022-12-23 NOTE — Consult Note (Signed)
Cardiology Consultation   Patient ID: Franklin Marks MRN: [id number]; DOB: 09/25/1950  Admit date: 12/22/2022 Date of Consult: 12/23/2022  PCP:  [name], MD   [facility] Providers Cardiologist:  Lorine Bears, MD   {   Patient Profile:   Franklin Marks is a 72 y.o. male with a history of CAD s/p remote CABG x 5 (LIMA-LAD, SVG-D1, SVG-D2, sequential SVG-RPAV-RPDA) in 2001 with multiple subsequent PCIs (DES to proximal SVG-D2 in 02/2015 in setting of NSTEMI, DES to proximal SVG-RPAV-RPDA and 06/2020 and DES to native left main/LCx in 07/2020), ischemic cardiomyopathy/chronic HFrEF with EF of 35-40% in 12/2011 with subsequent normalization to 55-60% on Echo in 06/2020, remote atrial fibrillation not on anticoagulation, bradycardia, PAD with chronic occlusion of bilateral SFAs and s/p stenting of bilateral common iliac arteries in 01/2022, COPD, obstructive sleep apnea, GERD, and tobacco use who is being seen 12/23/2022 for the evaluation of near syncope at the request of Dr. Natale Milch.  History of Present Illness:   Franklin Marks is a 72 year old male with the above history who is followed by Dr. Kirke Corin.  Patient has a long history of CAD with remote CABG in 2001 and multiple subsequent PCI's as detailed above.  He has been maintained on DAPT with Aspirin and Plavix. He has history of ischemic cardiomyopathy with normalization of EF on last Echo in 06/2020.  He also has a history of remote atrial fibrillation (not on anticoagulation) and mild bradycardia.  In addition, he has known PAD with chronic occlusions of bilateral SFA and s/p bilateral common iliac stenting  in 01/2022 by Vascular Surgery.  He was last seen by Hedy Camara, NP, in 04/2022 at which time he was doing well from a cardiac standpoint and denied any chest pain, shortness of breath, or claudication.  Patient presented to the ED on 12/22/2022 for further evaluation of dizziness. He reported 2 episodes of presyncope -  but did not actually have syncope.  On arrival of EMS, systolic BP was in the 90s and he was started on IV fluids.  Upon arrival to the ED, vital stable. EKG showed sinus bradycardia, rate 59 bpm, with first-degree AV block, no known RBBB, and PVCs but no acute ST/T changes.  High-sensitivity troponin negative x2.  Chest x-ray showed chronic interstitial changes but no acute findings. WBC 8.3, Hgb 11.9, Plts 159. Na 135, K 4.1, Glucose 121, BUN 18, Cr 1.11. TSH normal.  Patient was admitted and Cardiology consulted for further evaluation.  Echo today shows LVEF 55-60%, normal wall motion, grade 2 DD, mild LAE, low RA pressure of 3 mmHg.  Past Medical History:  Diagnosis Date   Anemia    Atrial fibrillation (HCC) 01/13/2012   CAD (coronary artery disease)    a. 2001 s/p CABGx5 (LIMA->LAD, VG->D1, VG->D2, VG->RPAV->RPDA; b. 02/2015 NSTEMI/PCI: VG->D2 95 (3.6x16 Promus DES). VG->D1 100; c. 06/2020 PCI: LM 75, LAD 100ost, D1 70, D2 70, LCX 82m, RCA 100ost, RPAV 100, VG->RPAV->RPDA 80ost (4.5x12 Synergy DES), VG->D1 100, VG->D2 ok, LIMA->LAD ok; d. 07/2020 PCI LM/LCX (2.5x15 Resolute Onyx DES).   COPD (chronic obstructive pulmonary disease) (HCC)    ongoing tobacco use   GERD (gastroesophageal reflux disease)    HFimpEF (heart failure with improved ejection fraction) (HCC)    a. 12/2011 Echo: EF 35-40%; b. 06/2020 Echo: EF 55-60%   Ischemic cardiomyopathy 01/10/2012   a. 12/2011 Echo: EF 35-40%, mild conc LVH,  sev HK of anteroseptum, borderline RVH; b. 06/2020 Echo:  EF 55-60%, no rwma, triv MR. Mild Ao sclerosis.   NSTEMI (non-ST elevated myocardial infarction) (HCC) 01/10/2012   Associated with ventricular fibrillation   OSA (obstructive sleep apnea)    a. 07/2020 WatchPat1: 1. Moderate Obstructive Sleep Apnea with AHI 16.5/hr. O2 sat down to 80%.    Past Surgical History:  Procedure Laterality Date   ABDOMINAL AORTOGRAM W/LOWER EXTREMITY N/A 02/13/2022   Procedure: ABDOMINAL AORTOGRAM W/LOWER  EXTREMITY;  Surgeon: Victorino Sparrow, MD;  Location: [facility] INVASIVE CV LAB;  Service: Cardiovascular;  Laterality: N/A;   CARDIAC CATHETERIZATION  2011   At [facility]   CARDIAC CATHETERIZATION  12/2011   prox LAD occlusion, ostial RCA occlusion, 30% prox LCx stenosis, LIMA-LAD: 80% post-anastamosis lesion, SVG-Dx2: old occlusion w/ thrombus (noted on prior 2011 cath at [facility]), SVG-Dx: patent, SVG-RCA: patent, diffuse irregs, 80-90% PDA/PLA lesions patent   CARDIAC CATHETERIZATION N/A 03/15/2015   Procedure: Left Heart Cath and Cors/Grafts Angiography;  Surgeon: Kathleene Hazel, MD;  Location: [facility] INVASIVE CV LAB;  Service: Cardiovascular;  Laterality: N/A;   CARDIAC CATHETERIZATION N/A 03/15/2015   Procedure: Coronary Stent Intervention;  Surgeon: Kathleene Hazel, MD;  Location: [facility] INVASIVE CV LAB;  Service: Cardiovascular;  Laterality: N/A;  svg to diagnoal 2   CORONARY ARTERY BYPASS GRAFT  2001   CORONARY IMAGING/OCT N/A 07/26/2020   Procedure: INTRAVASCULAR IMAGING/OCT;  Surgeon: Iran Ouch, MD;  Location: [facility] INVASIVE CV LAB;  Service: Cardiovascular;  Laterality: N/A;   CORONARY STENT INTERVENTION N/A 07/10/2020   Procedure: CORONARY STENT INTERVENTION;  Surgeon: Corky Crafts, MD;  Location: [facility] INVASIVE CV LAB;  Service: Cardiovascular;  Laterality: N/A;   CORONARY STENT INTERVENTION N/A 07/26/2020   Procedure: CORONARY STENT INTERVENTION;  Surgeon: Iran Ouch, MD;  Location: [facility] INVASIVE CV LAB;  Service: Cardiovascular;  Laterality: N/A;   CORONARY ULTRASOUND/IVUS N/A 07/10/2020   Procedure: Intravascular Ultrasound/IVUS;  Surgeon: Corky Crafts, MD;  Location: [facility] INVASIVE CV LAB;  Service: Cardiovascular;  Laterality: N/A;   LEFT HEART CATH AND CORS/GRAFTS ANGIOGRAPHY N/A 07/10/2020   Procedure: LEFT HEART CATH AND CORS/GRAFTS ANGIOGRAPHY;  Surgeon: Corky Crafts, MD;  Location: [facility] INVASIVE CV LAB;  Service: Cardiovascular;  Laterality: N/A;   LEFT  HEART CATHETERIZATION WITH CORONARY ANGIOGRAM N/A 01/10/2012   Procedure: LEFT HEART CATHETERIZATION WITH CORONARY ANGIOGRAM;  Surgeon: Lennette Bihari, MD;  Location: [facility] CATH LAB;  Service: Cardiovascular;  Laterality: N/A;     Home Medications:  Prior to Admission medications   Medication Sig Start Date End Date Taking? Authorizing Provider  aspirin EC 81 MG tablet Take 81 mg by mouth daily. Swallow whole.   Yes [provider]  atorvastatin (LIPITOR) 80 MG tablet TAKE ONE (1) TABLET BY MOUTH EACH EVENING AT 6PM 10/28/22  Yes Hammock, Sheri, NP  clonazePAM (KLONOPIN) 0.5 MG tablet TAKE 1/2 TO 1 TABLET BY MOUTH AT BEDTIME AS NEEDED FOR SLEEP/ANXIETY 10/28/22  Yes [name], MD  clopidogrel (PLAVIX) 75 MG tablet Take 1 tablet (75 mg total) by mouth daily. 10/29/22  Yes Iran Ouch, MD  isosorbide mononitrate (IMDUR) 30 MG 24 hr tablet Take 1 tablet (30 mg total) by mouth daily. PLEASE CALL OFFICE TO SCHEDULE APPOINTMENT PRIOR TO NEXT REFILL 10/28/22  Yes Iran Ouch, MD  lisinopril (ZESTRIL) 5 MG tablet TAKE 1/2 TABLET BY MOUTH EVERY DAY 02/04/22  Yes [name], MD  Omega-3 Fatty Acids (FISH OIL) 1000 MG CAPS Take 1,000 mg by mouth in the  morning and at bedtime.   Yes [provider]  pantoprazole (PROTONIX) 20 MG tablet TAKE 1 TABLET BY MOUTH EVERY DAY AS NEEDED 10/28/22  Yes [name], MD    Inpatient Medications: Scheduled Meds:  aspirin EC  81 mg Oral Daily   atorvastatin  80 mg Oral Daily   clonazePAM  0.5 mg Oral QHS   clopidogrel  75 mg Oral Daily   enoxaparin (LOVENOX) injection  40 mg Subcutaneous Daily   [START ON 12/24/2022] influenza vaccine adjuvanted  0.5 mL Intramuscular Tomorrow-1000   isosorbide mononitrate  30 mg Oral Daily   lisinopril  2.5 mg Oral Daily   omega-3 acid ethyl esters  1 g Oral Daily   sodium chloride flush  3 mL Intravenous Q12H   Continuous Infusions:  sodium chloride 100 mL/hr at 12/23/22 1558   PRN  Meds: ondansetron **OR** ondansetron (ZOFRAN) IV, pantoprazole  Allergies:    Allergies  Allergen Reactions   Codeine Nausea And Vomiting    Social History:   Social History   Socioeconomic History   Marital status: Married    Spouse name: Not on file   Number of children: 4   Years of education: Not on file   Highest education level: Not on file  Occupational History   Occupation: Management --now disabled   Occupation: Holiday representative work    Comment: Part--time  Tobacco Use   Smoking status: Former    Current packs/day: 0.00    Average packs/day: 1 pack/day for 45.0 years (45.0 ttl pk-yrs)    Types: Cigarettes    Start date: 03/12/1970    Quit date: 03/13/2015    Years since quitting: 7.7    Passive exposure: Current (as a child)   Smokeless tobacco: Never  Vaping Use   Vaping status: Never Used  Substance and Sexual Activity   Alcohol use: Not Currently    Comment: really rare beer   Drug use: Yes    Types: Marijuana   Sexual activity: Not Currently  Other Topics Concern   Not on file  Social History Narrative   Married twice   3 children from 1st marriage-- 1 from second   Worked as Investment banker, operational      Has living will   Wife has health care POA---alternate would be daughter Judeth Cornfield   Would accept resuscitation--- but no prolonged ventilation   No tube feeds if cognitively unaware   Social Determinants of Health   Financial Resource Strain: Not on file  Food Insecurity: No Food Insecurity (12/23/2022)   Hunger Vital Sign    Worried About Running Out of Food in the Last Year: Never true    Ran Out of Food in the Last Year: Never true  Transportation Needs: No Transportation Needs (12/23/2022)   PRAPARE - Administrator, Civil Service (Medical): No    Lack of Transportation (Non-Medical): No  Physical Activity: Not on file  Stress: Not on file  Social Connections: Not on file  Intimate Partner Violence: Not At Risk (12/23/2022)    Humiliation, Afraid, Rape, and Kick questionnaire    Fear of Current or Ex-Partner: No    Emotionally Abused: No    Physically Abused: No    Sexually Abused: No    Family History:    Family History  Problem Relation Age of Onset   Hyperlipidemia Mother    Alcohol abuse Father    Hyperlipidemia Father    Heart disease Sister    Hyperlipidemia Sister  Hyperlipidemia Sister    Heart disease Brother    Hyperlipidemia Brother    Heart disease Son    Diabetes Neg Hx    Hypertension Neg Hx      ROS:  Please see the history of present illness.   All other ROS reviewed and negative.     Physical Exam/Data:   Vitals:   12/23/22 1157 12/23/22 1200 12/23/22 1358 12/23/22 1710  BP: (!) 147/79 (!) 142/100 127/74 108/60  Pulse:  (!) 53 (!) 50 (!) 56  Resp:  12 20 15   Temp:   97.9 F (36.6 C) 97.8 F (36.6 C)  TempSrc:   Oral Oral  SpO2:  96% 97% 96%  Weight:   79.7 kg   Height:   5\' 9"  (1.753 m)     Intake/Output Summary (Last 24 hours) at 12/23/2022 1734 Last data filed at 12/23/2022 1500 Gross per 24 hour  Intake 1183 ml  Output --  Net 1183 ml      12/23/2022    1:58 PM 09/23/2022   11:28 AM 09/13/2022    9:26 AM  Last 3 Weights  Weight (lbs) 175 lb 11.3 oz 168 lb 169 lb  Weight (kg) 79.7 kg 76.204 kg 76.658 kg     Body mass index is 25.95 kg/m.  General appearance: alert and no distress Neck: no carotid bruit, no JVD, and thyroid not enlarged, symmetric, no tenderness/mass/nodules Lungs: clear to auscultation bilaterally Heart: regular rate and rhythm, S1, S2 normal, no murmur, click, rub or gallop Abdomen: soft, non-tender; bowel sounds normal; no masses,  no organomegaly Extremities: extremities normal, atraumatic, no cyanosis or edema Pulses: 2+ and symmetric Skin: Skin color, texture, turgor normal. No rashes or lesions Neurologic: Grossly normal Psych: Pleasant   Relevant CV Studies: See echo below from today  Laboratory Data:  High Sensitivity  Troponin:   Recent Labs  Lab 12/22/22 1744 12/22/22 2059  TROPONINIHS 8 8     Chemistry Recent Labs  Lab 12/22/22 1744 12/22/22 2336  NA 135 139  K 4.1 3.6  CL 105 106  CO2 21* 22  GLUCOSE 121* 129*  BUN 18 15  CREATININE 1.11 0.93  CALCIUM 8.6* 8.5*  GFRNONAA >60 >60  ANIONGAP 9 11    Recent Labs  Lab 12/22/22 2336  PROT 5.9*  ALBUMIN 3.1*  AST 16  ALT 15  ALKPHOS 127*  BILITOT 0.3   Lipids No results for input(s): "CHOL", "TRIG", "HDL", "LABVLDL", "LDLCALC", "CHOLHDL" in the last 168 hours.  Hematology Recent Labs  Lab 12/22/22 1744 12/22/22 2336  WBC 8.3 6.5  RBC 3.86* 3.46*  HGB 11.9* 11.0*  HCT 36.1* 32.7*  MCV 93.5 94.5  MCH 30.8 31.8  MCHC 33.0 33.6  RDW 13.6 13.7  PLT 159 134*   Thyroid  Recent Labs  Lab 12/22/22 2336  TSH 1.201    BNPNo results for input(s): "BNP", "PROBNP" in the last 168 hours.  DDimer No results for input(s): "DDIMER" in the last 168 hours.   Radiology/Studies:  ECHOCARDIOGRAM COMPLETE  Result Date: 12/23/2022    ECHOCARDIOGRAM REPORT   Patient Name:   Franklin Marks Date of Exam: 12/23/2022 Medical Rec #:  [id number]     Height:       67.2 in Accession #:    [id number]    Weight:       168.0 lb Date of Birth:  [birth year]    BSA:          1.883 m Patient  Age:    71 years      BP:           119/68 mmHg Patient Gender: M             HR:           47 bpm. Exam Location:  Inpatient Procedure: 2D Echo, Color Doppler and Cardiac Doppler Indications:    Syncope  History:        Patient has prior history of Echocardiogram examinations.                 Ischemic Cardiomyopathy, CAD, Prior CABG, COPD and PAD,                 Signs/Symptoms:Syncope; Risk Factors:Dyslipidemia.  Sonographer:    Milbert Coulter Referring Phys: Patrice.Shin MOHAMMAD L GARBA IMPRESSIONS  1. Left ventricular ejection fraction, by estimation, is 55 to 60%. The left ventricle has normal function. The left ventricle has no regional wall motion abnormalities. There is mild  concentric left ventricular hypertrophy. Left ventricular diastolic parameters are consistent with Grade II diastolic dysfunction (pseudonormalization).  2. Right ventricular systolic function is normal. The right ventricular size is normal. There is normal pulmonary artery systolic pressure.  3. Left atrial size was mildly dilated.  4. Right atrial size was mildly dilated.  5. The mitral valve is normal in structure. Trivial mitral valve regurgitation. No evidence of mitral stenosis.  6. The aortic valve is tricuspid. There is mild calcification of the aortic valve. There is mild thickening of the aortic valve. Aortic valve regurgitation is trivial. Aortic valve sclerosis/calcification is present, without any evidence of aortic stenosis. Aortic valve area, by VTI measures 2.32 cm. Aortic valve mean gradient measures 4.0 mmHg. Aortic valve Vmax measures 1.54 m/s.  7. The inferior vena cava is normal in size with greater than 50% respiratory variability, suggesting right atrial pressure of 3 mmHg. FINDINGS  Left Ventricle: Left ventricular ejection fraction, by estimation, is 55 to 60%. The left ventricle has normal function. The left ventricle has no regional wall motion abnormalities. The left ventricular internal cavity size was normal in size. There is  mild concentric left ventricular hypertrophy. Left ventricular diastolic parameters are consistent with Grade II diastolic dysfunction (pseudonormalization). Normal left ventricular filling pressure. Right Ventricle: The right ventricular size is normal. No increase in right ventricular wall thickness. Right ventricular systolic function is normal. There is normal pulmonary artery systolic pressure. The tricuspid regurgitant velocity is 2.63 m/s, and  with an assumed right atrial pressure of 3 mmHg, the estimated right ventricular systolic pressure is 30.7 mmHg. Left Atrium: Left atrial size was mildly dilated. Right Atrium: Right atrial size was mildly dilated.  Pericardium: There is no evidence of pericardial effusion. Mitral Valve: The mitral valve is normal in structure. Trivial mitral valve regurgitation. No evidence of mitral valve stenosis. Tricuspid Valve: The tricuspid valve is normal in structure. Tricuspid valve regurgitation is mild . No evidence of tricuspid stenosis. Aortic Valve: The aortic valve is tricuspid. There is mild calcification of the aortic valve. There is mild thickening of the aortic valve. Aortic valve regurgitation is trivial. Aortic valve sclerosis/calcification is present, without any evidence of aortic stenosis. Aortic valve mean gradient measures 4.0 mmHg. Aortic valve peak gradient measures 9.5 mmHg. Aortic valve area, by VTI measures 2.32 cm. Pulmonic Valve: The pulmonic valve was normal in structure. Pulmonic valve regurgitation is trivial. No evidence of pulmonic stenosis. Aorta: The aortic root is normal in size and structure.  Venous: The inferior vena cava is normal in size with greater than 50% respiratory variability, suggesting right atrial pressure of 3 mmHg. IAS/Shunts: No atrial level shunt detected by color flow Doppler.  LEFT VENTRICLE PLAX 2D LVIDd:         4.80 cm   Diastology LVIDs:         3.30 cm   LV e' medial:    7.51 cm/s LV PW:         1.10 cm   LV E/e' medial:  12.7 LV IVS:        1.10 cm   LV e' lateral:   10.70 cm/s LVOT diam:     2.00 cm   LV E/e' lateral: 8.9 LV SV:         85 LV SV Index:   45 LVOT Area:     3.14 cm  RIGHT VENTRICLE RV Basal diam:  3.50 cm RV Mid diam:    2.40 cm RV S prime:     9.14 cm/s TAPSE (M-mode): 1.6 cm LEFT ATRIUM             Index        RIGHT ATRIUM           Index LA diam:        4.50 cm 2.39 cm/m   RA Area:     20.30 cm LA Vol (A2C):   59.9 ml 31.82 ml/m  RA Volume:   60.80 ml  32.30 ml/m LA Vol (A4C):   48.7 ml 25.87 ml/m LA Biplane Vol: 54.9 ml 29.16 ml/m  AORTIC VALVE AV Area (Vmax):    2.49 cm AV Area (Vmean):   2.46 cm AV Area (VTI):     2.32 cm AV Vmax:            154.00 cm/s AV Vmean:          93.000 cm/s AV VTI:            0.368 m AV Peak Grad:      9.5 mmHg AV Mean Grad:      4.0 mmHg LVOT Vmax:         122.00 cm/s LVOT Vmean:        72.800 cm/s LVOT VTI:          0.272 m LVOT/AV VTI ratio: 0.74  AORTA Ao Root diam: 3.30 cm Ao Asc diam:  3.80 cm MITRAL VALVE               TRICUSPID VALVE MV Area (PHT): 2.48 cm    TR Peak grad:   27.7 mmHg MV Decel Time: 306 msec    TR Vmax:        263.00 cm/s MV E velocity: 95.40 cm/s MV A velocity: 63.40 cm/s  SHUNTS MV E/A ratio:  1.50        Systemic VTI:  0.27 m                            Systemic Diam: 2.00 cm Chilton Si MD Electronically signed by Chilton Si MD Signature Date/Time: 12/23/2022/3:51:32 PM    Final    DG Chest 2 View  Result Date: 12/22/2022 CLINICAL DATA:  Near syncope. EXAM: CHEST - 2 VIEW COMPARISON:  Chest radiograph dated 02/08/2022. FINDINGS: No focal consolidation, pleural effusion, or pneumothorax. There is diffuse chronic interstitial coarsening and bronchitic changes. The cardiac silhouette is within normal limits. Median sternotomy wires and  CABG vascular clips. No acute osseous pathology. Old displaced fracture of the right clavicle. IMPRESSION: 1. No active cardiopulmonary disease. 2. Chronic interstitial changes. Electronically Signed   By: Elgie Collard M.D.   On: 12/22/2022 19:24     Assessment and Plan:   Dizziness Near Syncope  Bradycardia -  Increased risk for conduction disease given history of PAF, RBBB and bradycardia. -  Recommend 2 week ZIO monitor at discharge (we will arrange and mail it to him), follow-up in [place] - Loop recorder is NOT indicated for pre-syncope  CAD s/p CABG and Multiple PCIs Patient has a long history of CAD s/p remote CABG x5 in 2001 with multiple subsequent PCIs (the last one in 07/2020).  - No chest pain. - Continue Imdur 30mg  daily. - Continue DAPT with Aspirin and Plavix. - Continue high-intensity statin.  Ischemic  Cardiomyopathy Chronic HFrEF with Improved EF LVEF as low as 35-40% in the past with subsequent normalization. Last Echo in 06/2020 showed LVEF of 55-60% with normal wall motion and diastolic parameters, normal RV, and no significant valvular disease. - Euvolemic on exam. Echo shows persistently normal LVEF 55-60%, no WMA's, grade 2 DD, mild LAE - Continue Lisinopril 2.5mg  daily. - No beta-blocker given baseline bradycardia.  Remote Atrial Fibrillation Patient has a history of remote atrial fibrillation with no documented recurrence. Therefore, he has not been on anticoagulation. - Maintaining sinus rhythm.  - No AV nodal agents due to baseline bradycardia.  PAD Patient has a history of PAD with chronic total occlusion of SFAs and s/p stenting of bilateral common iliac arteries in 01/2022.  - Continue DAPT as above. - Continue high-intensity statin.  Otherwise, per primary team: - COPD - Obstructive sleep apnea - GERD  Thanks for the consultation. No further suggestions at this time. Follow-up in [place] after discharge.  [facility] will sign off.   Medication Recommendations:  as above Other recommendations (labs, testing, etc):  2 week Zio monitor Follow up as an outpatient:  Dr. Kirke Corin or APP   For questions or updates, please contact [facility] Please consult [email or web address] for contact info under   Chrystie Nose, MD, Milagros Loll  [place]  [facility]  Medical Director of the [facility] Lipid Disorders &  Cardiovascular Risk Reduction Clinic Diplomate of the American Board of Clinical Lipidology Attending Cardiologist  Direct Dial: [phone]  Fax: [phone]  Website:  [email or web address]  Chrystie Nose, MD  12/23/2022 5:34 PM

## 2022-12-23 NOTE — Care Management Obs Status (Signed)
MEDICARE OBSERVATION STATUS NOTIFICATION   Patient Details  Name: VEDANTH SIRICO MRN: [id number] Date of Birth: 01-04-1951   Medicare Observation Status Notification Given:  Yes    Oletta Cohn, RN 12/23/2022, 10:04 AM

## 2022-12-23 NOTE — Progress Notes (Signed)
Patient decided to get his flu vaccine at his local [facility] instead of here. He would to be DC now.

## 2022-12-23 NOTE — Progress Notes (Signed)
   Will order 2 week live monitor for further evaluation of near syncope. It is already after 3pm so this will be mailed to him. Please see Dr. Blanchie Dessert consult note from today for more information.  Corrin Parker, PA-C 12/23/2022 5:54 PM

## 2022-12-23 NOTE — Discharge Summary (Signed)
Physician Discharge Summary  ANTOINNE Marks ZOX:[id number] DOB: 11-13-1950 DOA: 12/22/2022  PCP: [name], MD  Admit date: 12/22/2022 Discharge date: 12/23/2022  Admitted From: Home Disposition:  Home  Recommendations for Outpatient Follow-up:  Follow up with PCP in 1-2 weeks Follow up with cardiology as scheduled:  Home Health:None  Equipment/Devices:None  Discharge Condition:Stable  CODE STATUS:Full  Diet recommendation: Low salt, low fat diet    Brief/Interim Summary: Franklin Marks is a 72 y.o. male with medical history significant of coronary artery disease status post CABG, peripheral arterial disease, chronic diastolic heart failure, ischemic cardiomyopathy, tobacco abuse, COPD exacerbation, hyperlipidemia, paroxysmal atrial fibrillation, who presents with presyncopal episode and dizziness.     Discharge Diagnoses:  Principal Problem:   Syncope and collapse Active Problems:   Atherosclerosis of native coronary artery with angina pectoris (HCC)   COPD (chronic obstructive pulmonary disease) (HCC)   PAD (peripheral artery disease) (HCC)   Ischemic cardiomyopathy   GERD (gastroesophageal reflux disease)  Presyncope:  Likely symptomatic bradycardia w/ associated hypotension likely due to hypovolemia/poor PO intake Echo reassuring Monitor to be mailed by cardiology, appreciate assistance   Coronary artery disease: Status post CABG.  Continue monitoring. Essential hypertension: Blood pressure is controlled.  Continue home regimen. COPD: No acute exacerbation Peripheral arterial disease: Confirm on resume home regimen GERD: Continue with PPIs.  Discharge Instructions   Allergies as of 12/23/2022       Reactions   Codeine Nausea And Vomiting        Medication List     TAKE these medications    aspirin EC 81 MG tablet Take 81 mg by mouth daily. Swallow whole.   atorvastatin 80 MG tablet Commonly known as: LIPITOR TAKE ONE (1) TABLET BY MOUTH EACH  EVENING AT 6PM   clonazePAM 0.5 MG tablet Commonly known as: KLONOPIN TAKE 1/2 TO 1 TABLET BY MOUTH AT BEDTIME AS NEEDED FOR SLEEP/ANXIETY   clopidogrel 75 MG tablet Commonly known as: PLAVIX Take 1 tablet (75 mg total) by mouth daily.   Fish Oil 1000 MG Caps Take 1,000 mg by mouth in the morning and at bedtime.   isosorbide mononitrate 30 MG 24 hr tablet Commonly known as: IMDUR Take 1 tablet (30 mg total) by mouth daily. PLEASE CALL OFFICE TO SCHEDULE APPOINTMENT PRIOR TO NEXT REFILL   lisinopril 5 MG tablet Commonly known as: ZESTRIL TAKE 1/2 TABLET BY MOUTH EVERY DAY   pantoprazole 20 MG tablet Commonly known as: PROTONIX TAKE 1 TABLET BY MOUTH EVERY DAY AS NEEDED        Allergies  Allergen Reactions   Codeine Nausea And Vomiting    Consultations: Cardiology  Procedures/Studies: ECHOCARDIOGRAM COMPLETE  Result Date: 12/23/2022    ECHOCARDIOGRAM REPORT   Patient Name:   Franklin Marks Date of Exam: 12/23/2022 Medical Rec #:  [id number]     Height:       67.2 in Accession #:    [id number]    Weight:       168.0 lb Date of Birth:  October 09, 1950    BSA:          1.883 m Patient Age:    71 years      BP:           119/68 mmHg Patient Gender: M             HR:           47 bpm. Exam Location:  Inpatient Procedure: 2D Echo, Color  Doppler and Cardiac Doppler Indications:    Syncope  History:        Patient has prior history of Echocardiogram examinations.                 Ischemic Cardiomyopathy, CAD, Prior CABG, COPD and PAD,                 Signs/Symptoms:Syncope; Risk Factors:Dyslipidemia.  Sonographer:    Milbert Coulter Referring Phys: Patrice.Shin MOHAMMAD L GARBA IMPRESSIONS  1. Left ventricular ejection fraction, by estimation, is 55 to 60%. The left ventricle has normal function. The left ventricle has no regional wall motion abnormalities. There is mild concentric left ventricular hypertrophy. Left ventricular diastolic parameters are consistent with Grade II diastolic dysfunction  (pseudonormalization).  2. Right ventricular systolic function is normal. The right ventricular size is normal. There is normal pulmonary artery systolic pressure.  3. Left atrial size was mildly dilated.  4. Right atrial size was mildly dilated.  5. The mitral valve is normal in structure. Trivial mitral valve regurgitation. No evidence of mitral stenosis.  6. The aortic valve is tricuspid. There is mild calcification of the aortic valve. There is mild thickening of the aortic valve. Aortic valve regurgitation is trivial. Aortic valve sclerosis/calcification is present, without any evidence of aortic stenosis. Aortic valve area, by VTI measures 2.32 cm. Aortic valve mean gradient measures 4.0 mmHg. Aortic valve Vmax measures 1.54 m/s.  7. The inferior vena cava is normal in size with greater than 50% respiratory variability, suggesting right atrial pressure of 3 mmHg. FINDINGS  Left Ventricle: Left ventricular ejection fraction, by estimation, is 55 to 60%. The left ventricle has normal function. The left ventricle has no regional wall motion abnormalities. The left ventricular internal cavity size was normal in size. There is  mild concentric left ventricular hypertrophy. Left ventricular diastolic parameters are consistent with Grade II diastolic dysfunction (pseudonormalization). Normal left ventricular filling pressure. Right Ventricle: The right ventricular size is normal. No increase in right ventricular wall thickness. Right ventricular systolic function is normal. There is normal pulmonary artery systolic pressure. The tricuspid regurgitant velocity is 2.63 m/s, and  with an assumed right atrial pressure of 3 mmHg, the estimated right ventricular systolic pressure is 30.7 mmHg. Left Atrium: Left atrial size was mildly dilated. Right Atrium: Right atrial size was mildly dilated. Pericardium: There is no evidence of pericardial effusion. Mitral Valve: The mitral valve is normal in structure. Trivial mitral  valve regurgitation. No evidence of mitral valve stenosis. Tricuspid Valve: The tricuspid valve is normal in structure. Tricuspid valve regurgitation is mild . No evidence of tricuspid stenosis. Aortic Valve: The aortic valve is tricuspid. There is mild calcification of the aortic valve. There is mild thickening of the aortic valve. Aortic valve regurgitation is trivial. Aortic valve sclerosis/calcification is present, without any evidence of aortic stenosis. Aortic valve mean gradient measures 4.0 mmHg. Aortic valve peak gradient measures 9.5 mmHg. Aortic valve area, by VTI measures 2.32 cm. Pulmonic Valve: The pulmonic valve was normal in structure. Pulmonic valve regurgitation is trivial. No evidence of pulmonic stenosis. Aorta: The aortic root is normal in size and structure. Venous: The inferior vena cava is normal in size with greater than 50% respiratory variability, suggesting right atrial pressure of 3 mmHg. IAS/Shunts: No atrial level shunt detected by color flow Doppler.  LEFT VENTRICLE PLAX 2D LVIDd:         4.80 cm   Diastology LVIDs:  3.30 cm   LV e' medial:    7.51 cm/s LV PW:         1.10 cm   LV E/e' medial:  12.7 LV IVS:        1.10 cm   LV e' lateral:   10.70 cm/s LVOT diam:     2.00 cm   LV E/e' lateral: 8.9 LV SV:         85 LV SV Index:   45 LVOT Area:     3.14 cm  RIGHT VENTRICLE RV Basal diam:  3.50 cm RV Mid diam:    2.40 cm RV S prime:     9.14 cm/s TAPSE (M-mode): 1.6 cm LEFT ATRIUM             Index        RIGHT ATRIUM           Index LA diam:        4.50 cm 2.39 cm/m   RA Area:     20.30 cm LA Vol (A2C):   59.9 ml 31.82 ml/m  RA Volume:   60.80 ml  32.30 ml/m LA Vol (A4C):   48.7 ml 25.87 ml/m LA Biplane Vol: 54.9 ml 29.16 ml/m  AORTIC VALVE AV Area (Vmax):    2.49 cm AV Area (Vmean):   2.46 cm AV Area (VTI):     2.32 cm AV Vmax:           154.00 cm/s AV Vmean:          93.000 cm/s AV VTI:            0.368 m AV Peak Grad:      9.5 mmHg AV Mean Grad:      4.0 mmHg LVOT  Vmax:         122.00 cm/s LVOT Vmean:        72.800 cm/s LVOT VTI:          0.272 m LVOT/AV VTI ratio: 0.74  AORTA Ao Root diam: 3.30 cm Ao Asc diam:  3.80 cm MITRAL VALVE               TRICUSPID VALVE MV Area (PHT): 2.48 cm    TR Peak grad:   27.7 mmHg MV Decel Time: 306 msec    TR Vmax:        263.00 cm/s MV E velocity: 95.40 cm/s MV A velocity: 63.40 cm/s  SHUNTS MV E/A ratio:  1.50        Systemic VTI:  0.27 m                            Systemic Diam: 2.00 cm Chilton Si MD Electronically signed by Chilton Si MD Signature Date/Time: 12/23/2022/3:51:32 PM    Final    DG Chest 2 View  Result Date: 12/22/2022 CLINICAL DATA:  Near syncope. EXAM: CHEST - 2 VIEW COMPARISON:  Chest radiograph dated 02/08/2022. FINDINGS: No focal consolidation, pleural effusion, or pneumothorax. There is diffuse chronic interstitial coarsening and bronchitic changes. The cardiac silhouette is within normal limits. Median sternotomy wires and CABG vascular clips. No acute osseous pathology. Old displaced fracture of the right clavicle. IMPRESSION: 1. No active cardiopulmonary disease. 2. Chronic interstitial changes. Electronically Signed   By: Elgie Collard M.D.   On: 12/22/2022 19:24     Subjective: No acute issues/events overnight   Discharge Exam: Vitals:   12/23/22 1358 12/23/22 1710  BP:  127/74 108/60  Pulse: (!) 50 (!) 56  Resp: 20 15  Temp: 97.9 F (36.6 C) 97.8 F (36.6 C)  SpO2: 97% 96%   Vitals:   12/23/22 1157 12/23/22 1200 12/23/22 1358 12/23/22 1710  BP: (!) 147/79 (!) 142/100 127/74 108/60  Pulse:  (!) 53 (!) 50 (!) 56  Resp:  12 20 15   Temp:   97.9 F (36.6 C) 97.8 F (36.6 C)  TempSrc:   Oral Oral  SpO2:  96% 97% 96%  Weight:   79.7 kg   Height:   5\' 9"  (1.753 m)     General: Pt is alert, awake, not in acute distress Cardiovascular: Regular rhythm, rate 50s, S1/S2 +, no rubs, no gallops,  Respiratory: CTA bilaterally, no wheezing, no rhonchi Abdominal: Soft, NT, ND,  bowel sounds + Extremities: no edema, no cyanosis   The results of significant diagnostics from this hospitalization (including imaging, microbiology, ancillary and laboratory) are listed below for reference.     Microbiology: Recent Results (from the past 240 hour(s))  SARS Coronavirus 2 by RT PCR (hospital order, performed in [facility] hospital lab) *cepheid single result test* Anterior Nasal Swab     Status: None   Collection Time: 12/22/22  5:44 PM   Specimen: Anterior Nasal Swab  Result Value Ref Range Status   SARS Coronavirus 2 by RT PCR NEGATIVE NEGATIVE Final    Comment: Performed at [facility], [address] [place], [place] [address]     Labs: BNP (last 3 results) No results for input(s): "BNP" in the last 8760 hours.  Basic Metabolic Panel: Recent Labs  Lab 12/22/22 1744 12/22/22 2336  NA 135 139  K 4.1 3.6  CL 105 106  CO2 21* 22  GLUCOSE 121* 129*  BUN 18 15  CREATININE 1.11 0.93  CALCIUM 8.6* 8.5*   Liver Function Tests: Recent Labs  Lab 12/22/22 2336  AST 16  ALT 15  ALKPHOS 127*  BILITOT 0.3  PROT 5.9*  ALBUMIN 3.1*   CBC: Recent Labs  Lab 12/22/22 1744 12/22/22 2336  WBC 8.3 6.5  NEUTROABS 6.0  --   HGB 11.9* 11.0*  HCT 36.1* 32.7*  MCV 93.5 94.5  PLT 159 134*   CBG: Recent Labs  Lab 12/23/22 0708  GLUCAP 92   Thyroid function studies Recent Labs    12/22/22 2336  TSH 1.201   Urinalysis    Component Value Date/Time   COLORURINE YELLOW (A) 01/21/2020 0851   APPEARANCEUR HAZY (A) 01/21/2020 0851   LABSPEC 1.024 01/21/2020 0851   PHURINE 5.0 01/21/2020 0851   GLUCOSEU NEGATIVE 01/21/2020 0851   GLUCOSEU NEGATIVE 08/27/2016 1112   HGBUR SMALL (A) 01/21/2020 0851   BILIRUBINUR NEGATIVE 01/21/2020 0851   BILIRUBINUR negative 08/13/2016 1035   KETONESUR NEGATIVE 01/21/2020 0851   PROTEINUR NEGATIVE 01/21/2020 0851   UROBILINOGEN 0.2 08/27/2016 1112   NITRITE NEGATIVE 01/21/2020 0851   LEUKOCYTESUR NEGATIVE  01/21/2020 0851   Sepsis Labs Recent Labs  Lab 12/22/22 1744 12/22/22 2336  WBC 8.3 6.5   Microbiology Recent Results (from the past 240 hour(s))  SARS Coronavirus 2 by RT PCR (hospital order, performed in [facility] hospital lab) *cepheid single result test* Anterior Nasal Swab     Status: None   Collection Time: 12/22/22  5:44 PM   Specimen: Anterior Nasal Swab  Result Value Ref Range Status   SARS Coronavirus 2 by RT PCR NEGATIVE NEGATIVE Final    Comment: Performed at [facility]  [facility], [address], [place], [place] [address]     Time coordinating discharge: Over 30 minutes  SIGNED:   Azucena Fallen, DO [facility] 12/23/2022, 5:53 PM Pager   If 7PM-7AM, please contact night-coverage [email or web address]

## 2022-12-23 NOTE — Plan of Care (Signed)

## 2022-12-23 NOTE — Progress Notes (Signed)
Ordered 2 week live monitor for further evaluation of near syncope. Please see Dr. Blanchie Dessert consult note from today for more information.   Corrin Parker, PA-C 12/23/2022 5:56 PM

## 2022-12-23 NOTE — Addendum Note (Signed)
Addended by: Marjie Skiff on: 12/23/2022 05:57 PM   Modules accepted: Orders

## 2022-12-24 ENCOUNTER — Ambulatory Visit: Payer: PPO | Attending: Student

## 2022-12-24 DIAGNOSIS — R55 Syncope and collapse: Secondary | ICD-10-CM

## 2022-12-24 NOTE — Progress Notes (Unsigned)
Enrolled for [employer] to mail a ZIO AT Live Telemetry monitor to patients address on file.   Dr. Kirke Corin to read.

## 2022-12-31 DIAGNOSIS — R55 Syncope and collapse: Secondary | ICD-10-CM | POA: Diagnosis not present

## 2023-01-01 DIAGNOSIS — R55 Syncope and collapse: Secondary | ICD-10-CM | POA: Diagnosis not present

## 2023-01-08 ENCOUNTER — Other Ambulatory Visit: Payer: Self-pay | Admitting: Cardiovascular Disease

## 2023-01-08 ENCOUNTER — Other Ambulatory Visit: Payer: Self-pay | Admitting: Internal Medicine

## 2023-01-08 NOTE — Telephone Encounter (Signed)
Last filled 10-28-22 #30 Last OV 09-23-22 Next OV 7-10-5 [facility]

## 2023-01-17 ENCOUNTER — Telehealth: Payer: Self-pay | Admitting: *Deleted

## 2023-01-17 NOTE — Telephone Encounter (Signed)
Late entry  @ 5:10 pm  Received  complete report of patient Zio moniotor      Cardiac Monitor Alert  Date of alert:  01/17/2023   Patient Name: Franklin Marks  DOB: 06-02-1950  MRN: [id number]   [facility] Cardiologist: Lorine Bears, MD  [facility] EP:  None    Monitor Information: Long Term Monitor-Live Telemetry [ZioAT]  Reason:  syncope ad collapse Ordering provider:  Marjie Skiff PA   Alert Pause(s) - Longest:  3 or longer seconds This is the 1st alert for this rhythm.   Next Cardiology Appointment   Date:  01/23/23  Provider:    C. Fransico Michael NP  The patient could NOT be reached by telephone today.  Left message for patient call on Monday - to verify . He received message to keep appointment on 01/23/23  Arrhythmia, symptoms and history reviewed with  monitor reviewed by Dr Antoine Poche .   No action needed. Completed  information is already in patient 's chart  Plan:   patient to keep appointment on 01/23/23 at [facility] office  [place] Chad, [place]  01/17/2023 6:12 PM

## 2023-01-20 NOTE — Telephone Encounter (Signed)
 Called patient with no answer.  Left voicemail to return call.

## 2023-01-20 NOTE — Progress Notes (Deleted)
Cardiology Office Note:    Date:  01/20/2023   ID:  Franklin Marks, DOB May 10, 1950, MRN [id number]  PCP:  [name], MD  [facility] Cardiologist:  Lorine Bears, MD  [facility] Electrophysiologist:  None   Referring MD: [name], MD   Chief Complaint: ***  History of Present Illness:    Franklin Marks is a 72 y.o. male with a hx of CAD s/p CABG with more previous stenting, PAD with prior stenting, chronic heart failure with improved EF, ICM, HLD, provide tobacco abuse, COPD, GERD, anemia, OSA, afib who presents for follow-up.   The patient underwent CABG x5 in 2001. He suffered a NSTEMI in December 2016 and underwent PCI with stenting to the vein graft D2, CTO of the bilateral SFAs with distal reconstitution via collaterals and prior stenting. Echo in 2021 showed LVEF 50-55%. In late April he was admitted to [facility] for chest pain and normal troponins and underwent diagnostic cath which showed severe disease of the ostial vein graft to the RPAV to the RPDA. He also had severe left main disease extending to the native left circumflex which was not previously graft. He underwent PCI with DES placement to the vein graft RPA V to the RPDA with subsequent staged PCI and DES placement to the dsital left main/ostial left circumflex during the same admission. Echo showed LVEF 55-60%. He was found to have Mobitz type 1 heart block and symptomatic bradycardia with rates int he 30s during periods of sleep. He underwent outpatient sleep study which showed moderate OSA and was recommended to start CPAP.   He was seen 12/23/22 in the hospital for pre-syncope, dizzienss and bradycardia. A heart monitor was ordered. Echo showed LVEF 55-60%, no WMA, G2DD, mild LAE.   Past Medical History:  Diagnosis Date   Anemia    Atrial fibrillation (HCC) 01/13/2012   CAD (coronary artery disease)    a. 2001 s/p CABGx5 (LIMA->LAD, VG->D1, VG->D2, VG->RPAV->RPDA; b. 02/2015 NSTEMI/PCI: VG->D2 95 (3.6x16  Promus DES). VG->D1 100; c. 06/2020 PCI: LM 75, LAD 100ost, D1 70, D2 70, LCX 97m, RCA 100ost, RPAV 100, VG->RPAV->RPDA 80ost (4.5x12 Synergy DES), VG->D1 100, VG->D2 ok, LIMA->LAD ok; d. 07/2020 PCI LM/LCX (2.5x15 Resolute Onyx DES).   COPD (chronic obstructive pulmonary disease) (HCC)    ongoing tobacco use   GERD (gastroesophageal reflux disease)    HFimpEF (heart failure with improved ejection fraction) (HCC)    a. 12/2011 Echo: EF 35-40%; b. 06/2020 Echo: EF 55-60%   Ischemic cardiomyopathy 01/10/2012   a. 12/2011 Echo: EF 35-40%, mild conc LVH,  sev HK of anteroseptum, borderline RVH; b. 06/2020 Echo: EF 55-60%, no rwma, triv MR. Mild Ao sclerosis.   NSTEMI (non-ST elevated myocardial infarction) (HCC) 01/10/2012   Associated with ventricular fibrillation   OSA (obstructive sleep apnea)    a. 07/2020 WatchPat1: 1. Moderate Obstructive Sleep Apnea with AHI 16.5/hr. O2 sat down to 80%.    Past Surgical History:  Procedure Laterality Date   ABDOMINAL AORTOGRAM W/LOWER EXTREMITY N/A 02/13/2022   Procedure: ABDOMINAL AORTOGRAM W/LOWER EXTREMITY;  Surgeon: Victorino Sparrow, MD;  Location: [facility] INVASIVE CV LAB;  Service: Cardiovascular;  Laterality: N/A;   CARDIAC CATHETERIZATION  2011   At [facility]   CARDIAC CATHETERIZATION  12/2011   prox LAD occlusion, ostial RCA occlusion, 30% prox LCx stenosis, LIMA-LAD: 80% post-anastamosis lesion, SVG-Dx2: old occlusion w/ thrombus (noted on prior 2011 cath at [facility]), SVG-Dx: patent, SVG-RCA: patent, diffuse irregs, 80-90% PDA/PLA lesions patent  CARDIAC CATHETERIZATION N/A 03/15/2015   Procedure: Left Heart Cath and Cors/Grafts Angiography;  Surgeon: Kathleene Hazel, MD;  Location: [facility] INVASIVE CV LAB;  Service: Cardiovascular;  Laterality: N/A;   CARDIAC CATHETERIZATION N/A 03/15/2015   Procedure: Coronary Stent Intervention;  Surgeon: Kathleene Hazel, MD;  Location: [facility] INVASIVE CV LAB;  Service: Cardiovascular;  Laterality: N/A;  svg to  diagnoal 2   CORONARY ARTERY BYPASS GRAFT  2001   CORONARY IMAGING/OCT N/A 07/26/2020   Procedure: INTRAVASCULAR IMAGING/OCT;  Surgeon: Iran Ouch, MD;  Location: [facility] INVASIVE CV LAB;  Service: Cardiovascular;  Laterality: N/A;   CORONARY STENT INTERVENTION N/A 07/10/2020   Procedure: CORONARY STENT INTERVENTION;  Surgeon: Corky Crafts, MD;  Location: [facility] INVASIVE CV LAB;  Service: Cardiovascular;  Laterality: N/A;   CORONARY STENT INTERVENTION N/A 07/26/2020   Procedure: CORONARY STENT INTERVENTION;  Surgeon: Iran Ouch, MD;  Location: [facility] INVASIVE CV LAB;  Service: Cardiovascular;  Laterality: N/A;   CORONARY ULTRASOUND/IVUS N/A 07/10/2020   Procedure: Intravascular Ultrasound/IVUS;  Surgeon: Corky Crafts, MD;  Location: [facility] INVASIVE CV LAB;  Service: Cardiovascular;  Laterality: N/A;   LEFT HEART CATH AND CORS/GRAFTS ANGIOGRAPHY N/A 07/10/2020   Procedure: LEFT HEART CATH AND CORS/GRAFTS ANGIOGRAPHY;  Surgeon: Corky Crafts, MD;  Location: [facility] INVASIVE CV LAB;  Service: Cardiovascular;  Laterality: N/A;   LEFT HEART CATHETERIZATION WITH CORONARY ANGIOGRAM N/A 01/10/2012   Procedure: LEFT HEART CATHETERIZATION WITH CORONARY ANGIOGRAM;  Surgeon: Lennette Bihari, MD;  Location: [facility] CATH LAB;  Service: Cardiovascular;  Laterality: N/A;    Current Medications: No outpatient medications have been marked as taking for the 01/23/23 encounter (Appointment) with Fransico Marks, Patte Winkel H, PA-C.     Allergies:   Codeine   Social History   Socioeconomic History   Marital status: Married    Spouse name: Not on file   Number of children: 4   Years of education: Not on file   Highest education level: Not on file  Occupational History   Occupation: Management --now disabled   Occupation: Holiday representative work    Comment: Part--time  Tobacco Use   Smoking status: Former    Current packs/day: 0.00    Average packs/day: 1 pack/day for 45.0 years (45.0 ttl pk-yrs)    Types: Cigarettes     Start date: 03/12/1970    Quit date: 03/13/2015    Years since quitting: 7.8    Passive exposure: Current (as a child)   Smokeless tobacco: Never  Vaping Use   Vaping status: Never Used  Substance and Sexual Activity   Alcohol use: Not Currently    Comment: really rare beer   Drug use: Yes    Types: Marijuana   Sexual activity: Not Currently  Other Topics Concern   Not on file  Social History Narrative   Married twice   3 children from 1st marriage-- 1 from second   Worked as Investment banker, operational      Has living will   Wife has health care POA---alternate would be daughter Judeth Cornfield   Would accept resuscitation--- but no prolonged ventilation   No tube feeds if cognitively unaware   Social Determinants of Health   Financial Resource Strain: Not on file  Food Insecurity: No Food Insecurity (12/23/2022)   Hunger Vital Sign    Worried About Running Out of Food in the Last Year: Never true    Ran Out of Food in the Last Year: Never true  Transportation Needs: No Transportation Needs (  12/23/2022)   PRAPARE - Administrator, Civil Service (Medical): No    Lack of Transportation (Non-Medical): No  Physical Activity: Not on file  Stress: Not on file  Social Connections: Not on file     Family History: The patient's ***family history includes Alcohol abuse in his father; Heart disease in his brother, sister, and son; Hyperlipidemia in his brother, father, mother, sister, and sister. There is no history of Diabetes or Hypertension.  ROS:   Please see the history of present illness.    *** All other systems reviewed and are negative.  EKGs/Labs/Other Studies Reviewed:    The following studies were reviewed today: ***  EKG:  EKG is *** ordered today.  The ekg ordered today demonstrates ***  Recent Labs: 02/08/2022: Magnesium 2.0 12/22/2022: ALT 15; BUN 15; Creatinine, Ser 0.93; Hemoglobin 11.0; Platelets 134; Potassium 3.6; Sodium 139; TSH 1.201  Recent Lipid  Panel    Component Value Date/Time   CHOL 89 09/23/2022 1230   TRIG 252.0 (H) 09/23/2022 1230   HDL 26.90 (L) 09/23/2022 1230   CHOLHDL 3 09/23/2022 1230   VLDL 50.4 (H) 09/23/2022 1230   LDLCALC 42 07/08/2020 0124   LDLDIRECT 35.0 09/23/2022 1230     Risk Assessment/Calculations:   {Does this patient have ATRIAL FIBRILLATION?:[id number]}   Physical Exam:    VS:  There were no vitals taken for this visit.    Wt Readings from Last 3 Encounters:  12/23/22 175 lb 11.3 oz (79.7 kg)  09/23/22 168 lb (76.2 kg)  09/13/22 169 lb (76.7 kg)     GEN: *** Well nourished, well developed in no acute distress HEENT: Normal NECK: No JVD; No carotid bruits LYMPHATICS: No lymphadenopathy CARDIAC: ***RRR, no murmurs, rubs, gallops RESPIRATORY:  Clear to auscultation without rales, wheezing or rhonchi  ABDOMEN: Soft, non-tender, non-distended MUSCULOSKELETAL:  No edema; No deformity  SKIN: Warm and dry NEUROLOGIC:  Alert and oriented x 3 PSYCHIATRIC:  Normal affect   ASSESSMENT:    No diagnosis found. PLAN:    In order of problems listed above:  ***  Disposition: Follow up {follow up:15908} with ***   Shared Decision Making/Informed Consent   {Are you ordering a CV Procedure (e.g. stress test, cath, DCCV, TEE, etc)?   Press F2        :[id number]}    Signed, Garhett Bernhard David Stall, PA-C  01/20/2023 2:45 PM    [facility]

## 2023-01-23 ENCOUNTER — Ambulatory Visit: Payer: PPO | Attending: Medical | Admitting: Medical

## 2023-02-03 ENCOUNTER — Telehealth: Payer: Self-pay | Admitting: Cardiovascular Disease

## 2023-02-03 NOTE — Telephone Encounter (Signed)
Left voicemail to schedule follow up appointment

## 2023-02-03 NOTE — Telephone Encounter (Signed)
-----   Message from [facility] sent at 10/29/2022 11:09 AM EDT ----- Regarding: Appointment recall Please contact patient to schedule 6 mo f/u due on 11/06/22. Medication being refilled.  Thank you,  Ferne Coe

## 2023-02-12 ENCOUNTER — Ambulatory Visit: Payer: PPO | Attending: Medical | Admitting: Medical

## 2023-02-12 ENCOUNTER — Encounter: Payer: Self-pay | Admitting: Medical

## 2023-02-12 VITALS — BP 112/74 | HR 63 | Ht 68.0 in | Wt 179.4 lb

## 2023-02-12 DIAGNOSIS — R55 Syncope and collapse: Secondary | ICD-10-CM | POA: Diagnosis not present

## 2023-02-12 DIAGNOSIS — G4730 Sleep apnea, unspecified: Secondary | ICD-10-CM | POA: Diagnosis not present

## 2023-02-12 DIAGNOSIS — I2510 Atherosclerotic heart disease of native coronary artery without angina pectoris: Secondary | ICD-10-CM

## 2023-02-12 DIAGNOSIS — I455 Other specified heart block: Secondary | ICD-10-CM

## 2023-02-12 DIAGNOSIS — R0609 Other forms of dyspnea: Secondary | ICD-10-CM | POA: Diagnosis not present

## 2023-02-12 DIAGNOSIS — I480 Paroxysmal atrial fibrillation: Secondary | ICD-10-CM | POA: Diagnosis not present

## 2023-02-12 NOTE — Patient Instructions (Signed)
Medication Instructions:  Your Physician recommend you continue on your current medication as directed.     *If you need a refill on your cardiac medications before your next appointment, please call your pharmacy*   Lab Work: None ordered at this time    Follow-Up: At [facility], you and your health needs are our priority.  As part of our continuing mission to provide you with exceptional heart care, we have created designated Provider Care Teams.  These Care Teams include your primary Cardiologist (physician) and Advanced Practice Providers (APPs -  Physician Assistants and Nurse Practitioners) who all work together to provide you with the care you need, when you need it.  We recommend signing up for the patient portal called "MyChart".  Sign up information is provided on this After Visit Summary.  MyChart is used to connect with patients for Virtual Visits (Telemedicine).  Patients are able to view lab/test results, encounter notes, upcoming appointments, etc.  Non-urgent messages can be sent to your provider as well.   To learn more about what you can do with MyChart, go to [email or web address].    Your next appointment:   3 month(s)  Provider:   You may see Lorine Bears, MD or one of the following Advanced Practice Providers on your designated Care Team:   Nicolasa Ducking, NP Eula Listen, PA-C Cadence Fransico Michael, PA-C Charlsie Quest, NP Carlos Levering, NP

## 2023-02-12 NOTE — Progress Notes (Signed)
Cardiology Office Note:    Date:  02/12/2023   ID:  Franklin Marks, DOB 1951/03/03, MRN [id number]  PCP:  [name], MD  [facility] Cardiologist:  Lorine Bears, MD  [facility] Electrophysiologist:  None   Referring MD: [name], MD   Chief Complaint: Hospital follow-up  History of Present Illness:    Franklin Marks is a 72 y.o. male with a hx of CAD s/p remote CABG x5 (LIMA -LAD, SVG to D1, SVG to D2, sequential SVG to RPDA VA to RPDA in 2001 with multiple subsequent PCI's (DES to proximal SVG to D1 in 2016 in the setting of non-STEMI, DES to proximal SVG to RPDA Vito RPDA in 2022 and DES to native left main/left circumflex in 2022, ischemic cardiomyopathy, chronic HFrEF with EF of 35 to 40% in October 2013 with subsequent normalization to 55 to 60% on echo in April 2022, remote A-fib not on anticoagulation, bradycardia, PAD with chronic occlusion of bilateral SFAs and status post stenting of bilateral common iliac arteries and November 2023, COPD, OSA, GERD, and tobacco use who is being seen for ER follow-up.  Patient presented to the ER 12/22/2022 with dizziness and presyncope.  He was noted to be bradycardic.  Echo showed EF 55 to 60%, no wall motion abnormality, grade 2 diastolic dysfunction.  Heart monitor was ordered.  This showed predominantly normal sinus rhythm with first-degree AV block, IVCD, 1 pause lasting 3 seconds at 20 bpm, pause occurred due to possible atrial tachycardia with block, second-degree AV block, rare PACs, PVCs of 1.5% burden.  Today, the patient is overall feeling OK. He reports occasional shortness of breath, which is likely multifactorial with lung and heart issues. SOB is worse when he walks. No orthopnea. No chest pain, Lower leg edema, heart racing, palpitations.  He denies dizziness and lightheadedness. Symptoms are not like prior stenting.  Patient reports prior history of sleep apnea, but has not been treated.   Past Medical  History:  Diagnosis Date   Anemia    Atrial fibrillation (HCC) 01/13/2012   CAD (coronary artery disease)    a. 2001 s/p CABGx5 (LIMA->LAD, VG->D1, VG->D2, VG->RPAV->RPDA; b. 02/2015 NSTEMI/PCI: VG->D2 95 (3.6x16 Promus DES). VG->D1 100; c. 06/2020 PCI: LM 75, LAD 100ost, D1 70, D2 70, LCX 60m, RCA 100ost, RPAV 100, VG->RPAV->RPDA 80ost (4.5x12 Synergy DES), VG->D1 100, VG->D2 ok, LIMA->LAD ok; d. 07/2020 PCI LM/LCX (2.5x15 Resolute Onyx DES).   COPD (chronic obstructive pulmonary disease) (HCC)    ongoing tobacco use   GERD (gastroesophageal reflux disease)    HFimpEF (heart failure with improved ejection fraction) (HCC)    a. 12/2011 Echo: EF 35-40%; b. 06/2020 Echo: EF 55-60%   Ischemic cardiomyopathy 01/10/2012   a. 12/2011 Echo: EF 35-40%, mild conc LVH,  sev HK of anteroseptum, borderline RVH; b. 06/2020 Echo: EF 55-60%, no rwma, triv MR. Mild Ao sclerosis.   NSTEMI (non-ST elevated myocardial infarction) (HCC) 01/10/2012   Associated with ventricular fibrillation   OSA (obstructive sleep apnea)    a. 07/2020 WatchPat1: 1. Moderate Obstructive Sleep Apnea with AHI 16.5/hr. O2 sat down to 80%.    Past Surgical History:  Procedure Laterality Date   ABDOMINAL AORTOGRAM W/LOWER EXTREMITY N/A 02/13/2022   Procedure: ABDOMINAL AORTOGRAM W/LOWER EXTREMITY;  Surgeon: Victorino Sparrow, MD;  Location: [facility] INVASIVE CV LAB;  Service: Cardiovascular;  Laterality: N/A;   CARDIAC CATHETERIZATION  2011   At [facility]   CARDIAC CATHETERIZATION  12/2011   prox LAD  occlusion, ostial RCA occlusion, 30% prox LCx stenosis, LIMA-LAD: 80% post-anastamosis lesion, SVG-Dx2: old occlusion w/ thrombus (noted on prior 2011 cath at [facility]), SVG-Dx: patent, SVG-RCA: patent, diffuse irregs, 80-90% PDA/PLA lesions patent   CARDIAC CATHETERIZATION N/A 03/15/2015   Procedure: Left Heart Cath and Cors/Grafts Angiography;  Surgeon: Kathleene Hazel, MD;  Location: [facility] INVASIVE CV LAB;  Service: Cardiovascular;  Laterality:  N/A;   CARDIAC CATHETERIZATION N/A 03/15/2015   Procedure: Coronary Stent Intervention;  Surgeon: Kathleene Hazel, MD;  Location: [facility] INVASIVE CV LAB;  Service: Cardiovascular;  Laterality: N/A;  svg to diagnoal 2   CORONARY ARTERY BYPASS GRAFT  2001   CORONARY IMAGING/OCT N/A 07/26/2020   Procedure: INTRAVASCULAR IMAGING/OCT;  Surgeon: Iran Ouch, MD;  Location: [facility] INVASIVE CV LAB;  Service: Cardiovascular;  Laterality: N/A;   CORONARY STENT INTERVENTION N/A 07/10/2020   Procedure: CORONARY STENT INTERVENTION;  Surgeon: Corky Crafts, MD;  Location: [facility] INVASIVE CV LAB;  Service: Cardiovascular;  Laterality: N/A;   CORONARY STENT INTERVENTION N/A 07/26/2020   Procedure: CORONARY STENT INTERVENTION;  Surgeon: Iran Ouch, MD;  Location: [facility] INVASIVE CV LAB;  Service: Cardiovascular;  Laterality: N/A;   CORONARY ULTRASOUND/IVUS N/A 07/10/2020   Procedure: Intravascular Ultrasound/IVUS;  Surgeon: Corky Crafts, MD;  Location: [facility] INVASIVE CV LAB;  Service: Cardiovascular;  Laterality: N/A;   LEFT HEART CATH AND CORS/GRAFTS ANGIOGRAPHY N/A 07/10/2020   Procedure: LEFT HEART CATH AND CORS/GRAFTS ANGIOGRAPHY;  Surgeon: Corky Crafts, MD;  Location: [facility] INVASIVE CV LAB;  Service: Cardiovascular;  Laterality: N/A;   LEFT HEART CATHETERIZATION WITH CORONARY ANGIOGRAM N/A 01/10/2012   Procedure: LEFT HEART CATHETERIZATION WITH CORONARY ANGIOGRAM;  Surgeon: Lennette Bihari, MD;  Location: [facility] CATH LAB;  Service: Cardiovascular;  Laterality: N/A;    Current Medications: Current Meds  Medication Sig   aspirin EC 81 MG tablet Take 81 mg by mouth daily. Swallow whole.   atorvastatin (LIPITOR) 80 MG tablet TAKE ONE (1) TABLET BY MOUTH EACH EVENING AT 6PM   clonazePAM (KLONOPIN) 0.5 MG tablet TAKE 1/2 TO 1 TABLET BY MOUTH AT BEDTIME AS NEEDED FOR SLEEP/ANXIETY   clopidogrel (PLAVIX) 75 MG tablet TAKE 1 TABLET BY MOUTH EVERY DAY   isosorbide mononitrate (IMDUR) 30 MG 24 hr tablet  Take 1 tablet (30 mg total) by mouth daily.   lisinopril (ZESTRIL) 5 MG tablet TAKE 1/2 TABLET BY MOUTH DAILY   Omega-3 Fatty Acids (FISH OIL) 1000 MG CAPS Take 1,000 mg by mouth in the morning and at bedtime.   pantoprazole (PROTONIX) 20 MG tablet TAKE 1 TABLET BY MOUTH EVERY DAY AS NEEDED     Allergies:   Codeine   Social History   Socioeconomic History   Marital status: Married    Spouse name: Not on file   Number of children: 4   Years of education: Not on file   Highest education level: Not on file  Occupational History   Occupation: Management --now disabled   Occupation: Holiday representative work    Comment: Part--time  Tobacco Use   Smoking status: Former    Current packs/day: 0.00    Average packs/day: 1 pack/day for 45.0 years (45.0 ttl pk-yrs)    Types: Cigarettes    Start date: 03/12/1970    Quit date: 03/13/2015    Years since quitting: 7.9    Passive exposure: Current (as a child)   Smokeless tobacco: Never  Vaping Use   Vaping status: Never Used  Substance and Sexual Activity  Alcohol use: Not Currently    Comment: really rare beer   Drug use: Yes    Types: Marijuana   Sexual activity: Not Currently  Other Topics Concern   Not on file  Social History Narrative   Married twice   3 children from 1st marriage-- 1 from second   Worked as Investment banker, operational      Has living will   Wife has health care POA---alternate would be daughter Judeth Cornfield   Would accept resuscitation--- but no prolonged ventilation   No tube feeds if cognitively unaware   Social Determinants of Health   Financial Resource Strain: Not on file  Food Insecurity: No Food Insecurity (12/23/2022)   Hunger Vital Sign    Worried About Running Out of Food in the Last Year: Never true    Ran Out of Food in the Last Year: Never true  Transportation Needs: No Transportation Needs (12/23/2022)   PRAPARE - Administrator, Civil Service (Medical): No    Lack of Transportation  (Non-Medical): No  Physical Activity: Not on file  Stress: Not on file  Social Connections: Not on file     Family History: The patient's family history includes Alcohol abuse in his father; Heart disease in his brother, sister, and son; Hyperlipidemia in his brother, father, mother, sister, and sister. There is no history of Diabetes or Hypertension.  ROS:   Please see the history of present illness.     All other systems reviewed and are negative.  EKGs/Labs/Other Studies Reviewed:    The following studies were reviewed today:  Echo 12/2022  1. Left ventricular ejection fraction, by estimation, is 55 to 60%. The  left ventricle has normal function. The left ventricle has no regional  wall motion abnormalities. There is mild concentric left ventricular  hypertrophy. Left ventricular diastolic  parameters are consistent with Grade II diastolic dysfunction  (pseudonormalization).   2. Right ventricular systolic function is normal. The right ventricular  size is normal. There is normal pulmonary artery systolic pressure.   3. Left atrial size was mildly dilated.   4. Right atrial size was mildly dilated.   5. The mitral valve is normal in structure. Trivial mitral valve  regurgitation. No evidence of mitral stenosis.   6. The aortic valve is tricuspid. There is mild calcification of the  aortic valve. There is mild thickening of the aortic valve. Aortic valve  regurgitation is trivial. Aortic valve sclerosis/calcification is present,  without any evidence of aortic  stenosis. Aortic valve area, by VTI measures 2.32 cm. Aortic valve mean  gradient measures 4.0 mmHg. Aortic valve Vmax measures 1.54 m/s.   7. The inferior vena cava is normal in size with greater than 50%  respiratory variability, suggesting right atrial pressure of 3 mmHg.   LHC 06/2020  Ost LAD lesion is 100% stenosed. LIMA to LAD is patent. Dist LAD-1 lesion is 70% stenosed. SVG to first diagonal is  occluded. Mid Cx lesion is 30% stenosed. Ost RCA lesion is 100% stenosed. SVG to PDA is patent with 80% proximal stenosis. A drug-eluting stent was successfully placed using a SYNERGY XD 4.50X12, postdilated to > 5 mm and optimized with IVUS. Post intervention, there is a 0% residual stenosis. RPAV lesion is occluded with left to right collaterals. Patent stent in SVG to second diagonal. Mid LM to Ost LAD lesion is 75% stenosed. This has progressed compared to the prior cath. The left ventricular ejection fraction is 45-50% by visual estimate.  There is mild left ventricular systolic dysfunction. Anterolateral hypokinesis. LV end diastolic pressure is normal. There is no aortic valve stenosis. Origin to Prox Graft lesion before RPAV is 80% stenosed. And is normal in caliber. The graft exhibits mild .   Continue aggressive secondary prevention.  Consider PCI of left main into circumflex tomorrow.  Could use right radial approach.      Echo 06/2020  1. Left ventricular ejection fraction, by estimation, is 55 to 60%. The  left ventricle has normal function. The left ventricle has no regional  wall motion abnormalities. Left ventricular diastolic parameters were  normal.   2. Right ventricular systolic function is normal. The right ventricular  size is normal. Tricuspid regurgitation signal is inadequate for assessing  PA pressure.   3. The mitral valve is normal in structure. Trivial mitral valve  regurgitation. No evidence of mitral stenosis.   4. The aortic valve is tricuspid. Aortic valve regurgitation is not  visualized. Mild aortic valve sclerosis is present, with no evidence of  aortic valve stenosis.   5. The inferior vena cava is normal in size with <50% respiratory  variability, suggesting right atrial pressure of 8 mmHg.    EKG:  EKG is ordered today.  The ekg ordered today demonstrates NSR 1st degree AV block, 63bpm, RBBB, t wave flattening aVL  Recent Labs: 12/22/2022:  ALT 15; BUN 15; Creatinine, Ser 0.93; Hemoglobin 11.0; Platelets 134; Potassium 3.6; Sodium 139; TSH 1.201  Recent Lipid Panel    Component Value Date/Time   CHOL 89 09/23/2022 1230   TRIG 252.0 (H) 09/23/2022 1230   HDL 26.90 (L) 09/23/2022 1230   CHOLHDL 3 09/23/2022 1230   VLDL 50.4 (H) 09/23/2022 1230   LDLCALC 42 07/08/2020 0124   LDLDIRECT 35.0 09/23/2022 1230     Physical Exam:    VS:  BP 112/74 (BP Location: Left Arm, Patient Position: Sitting, Cuff Size: Normal)   Pulse 63   Ht 5\' 8"  (1.727 m)   Wt 179 lb 6.4 oz (81.4 kg)   SpO2 95%   BMI 27.28 kg/m     Wt Readings from Last 3 Encounters:  02/12/23 179 lb 6.4 oz (81.4 kg)  12/23/22 175 lb 11.3 oz (79.7 kg)  09/23/22 168 lb (76.2 kg)     GEN:  Well nourished, well developed in no acute distress HEENT: Normal NECK: No JVD; No carotid bruits LYMPHATICS: No lymphadenopathy CARDIAC: RRR, no murmurs, rubs, gallops RESPIRATORY:  diffusely diminished ABDOMEN: Soft, non-tender, non-distended MUSCULOSKELETAL:  No edema; No deformity  SKIN: Warm and dry NEUROLOGIC:  Alert and oriented x 3 PSYCHIATRIC:  Normal affect   ASSESSMENT:    1. Pre-syncope   2. DOE (dyspnea on exertion)   3. Coronary artery disease involving native coronary artery of native heart without angina pectoris   4. Sleep apnea, unspecified type   5. Sinus pause   6. Paroxysmal A-fib (HCC)    PLAN:    In order of problems listed above:  Pre-syncope Patient had episode of presyncope and went to the ER.  Patient was found to have bradycardia and hypotension due to hypovolemia and poor oral intake.  Echo showed EF of 55 to 60%, grade 2 diastolic dysfunction, mild valvular disease.  Cardiology saw patient in the ER.  Subsequent heart monitor showed normal sinus rhythm with an average heart rate of 65 bpm, first-degree AV block, bundle branch block, 1 pause lasting 3 seconds at night, possible atrial tachycardia with block, second-degree AV Mobitz  1  was present, rare PACs, occasional PVCs with burden of 1.5%.  Patient denies any presyncope or syncope.  Blood pressure today is normal and heart rate is 63.  Orthostatics are negative.  DOE CAD s/p CABG abd multiple stents Patient reports chronic dyspnea on exertion that is mildly worse.  This is likely multifactorial given CAD, CHF, COPD, untreated OSA, general deconditioning. He reports this is not similar to prior stents. He denies chest pain. We will continue to monitor symptoms for now. If breathing gets worse, may need to consider ischemic evaluation. Continue Aspirin, Lipitor, Imdur.   Chronic HFrEF with improved EF Ischemic cardiomyopathy EF as low as 35 to 40% in the past with subsequent normalization.  Recent echo showed normal LVEF, grade 2 diastolic dysfunction, and mild valvular disease.  Patient appears euvolemic on exam.  Continue lisinopril 2.5 mg daily.  No beta-blocker given bradycardia and first-degree AV block, pause, and second-degree Mobitz 1.  Nocturnal Pause OSA COPD Patient reports prior diagnosis of sleep apnea, but has not been treated.  I will refer to pulmonology for OSA treatment.   Remote atrial fibrillation Patient has history of remote atrial fibrillation with no documented recurrence.  Recent heart monitor showed no recurrent A-fib.  He has not been on anticoagulation.  He is in normal sinus rhythm today.  No AV nodal blocking agents as above.  Disposition: Follow up in 2 month(s) with MD/APP    Signed, Mical Kicklighter David Stall, PA-C  02/12/2023 4:43 PM    [facility]

## 2023-04-01 ENCOUNTER — Institutional Professional Consult (permissible substitution): Payer: PPO | Admitting: Internal Medicine

## 2023-04-08 ENCOUNTER — Other Ambulatory Visit: Payer: Self-pay | Admitting: Cardiovascular Disease

## 2023-04-12 ENCOUNTER — Other Ambulatory Visit: Payer: Self-pay | Admitting: Internal Medicine

## 2023-04-14 NOTE — Telephone Encounter (Signed)
Last filled 01-09-23 #30 Last OV 09-23-22 Next OV 7-10-5 [facility]

## 2023-05-20 ENCOUNTER — Encounter: Payer: Self-pay | Admitting: Medical

## 2023-05-20 ENCOUNTER — Ambulatory Visit: Payer: PPO | Attending: Medical | Admitting: Medical

## 2023-05-20 VITALS — BP 126/72 | HR 65 | Ht 69.0 in | Wt 182.8 lb

## 2023-05-20 DIAGNOSIS — I480 Paroxysmal atrial fibrillation: Secondary | ICD-10-CM | POA: Diagnosis not present

## 2023-05-20 DIAGNOSIS — G4730 Sleep apnea, unspecified: Secondary | ICD-10-CM

## 2023-05-20 DIAGNOSIS — R55 Syncope and collapse: Secondary | ICD-10-CM | POA: Diagnosis not present

## 2023-05-20 DIAGNOSIS — I455 Other specified heart block: Secondary | ICD-10-CM

## 2023-05-20 DIAGNOSIS — I2510 Atherosclerotic heart disease of native coronary artery without angina pectoris: Secondary | ICD-10-CM

## 2023-05-20 DIAGNOSIS — G4733 Obstructive sleep apnea (adult) (pediatric): Secondary | ICD-10-CM

## 2023-05-20 NOTE — Patient Instructions (Signed)
 Medication Instructions:  Your physician recommends that you continue on your current medications as directed. Please refer to the Current Medication list given to you today.   *If you need a refill on your cardiac medications before your next appointment, please call your pharmacy*   Lab Work: No labs ordered today    Testing/Procedures: No test ordered today    Follow-Up: At [facility], you and your health needs are our priority.  As part of our continuing mission to provide you with exceptional heart care, we have created designated Provider Care Teams.  These Care Teams include your primary Cardiologist (physician) and Advanced Practice Providers (APPs -  Physician Assistants and Nurse Practitioners) who all work together to provide you with the care you need, when you need it.  We recommend signing up for the patient portal called "MyChart".  Sign up information is provided on this After Visit Summary.  MyChart is used to connect with patients for Virtual Visits (Telemedicine).  Patients are able to view lab/test results, encounter notes, upcoming appointments, etc.  Non-urgent messages can be sent to your provider as well.   To learn more about what you can do with MyChart, go to [email or web address].    Your next appointment:   6 month(s)  Provider:   You may see Lorine Bears, MD or one of the following Advanced Practice Providers on your designated Care Team:   Nicolasa Ducking, NP Eula Listen, PA-C Cadence Fransico Michael, PA-C Charlsie Quest, NP Carlos Levering, NP

## 2023-05-20 NOTE — Progress Notes (Signed)
 Cardiology Office Note:  .   Date:  05/20/2023  ID:  Franklin Marks, DOB 31-May-1950, MRN [id number] PCP: [name], MD  [facility] Providers Cardiologist:  Lorine Bears, MD {  History of Present Illness: .   Franklin Marks is a 73 y.o. male with a hx of CAD s/p remote CABG x5 (LIMA -LAD, SVG to D1, SVG to D2, sequential SVG to RPDA VA to RPDA in 2001 with multiple subsequent PCI's (DES to proximal SVG to D1 in 2016 in the setting of non-STEMI, DES to proximal SVG to RPDA Vito RPDA in 2022 and DES to native left main/left circumflex in 2022, ischemic cardiomyopathy, chronic HFrEF with EF of 35 to 40% in October 2013 with subsequent normalization to 55 to 60% on echo in April 2022, remote A-fib not on anticoagulation, bradycardia, PAD with chronic occlusion of bilateral SFAs and status post stenting of bilateral common iliac arteries and November 2023, COPD, OSA, GERD, and tobacco use who is being seen for follow-up.   Patient presented to the ER 12/22/2022 with dizziness and presyncope.  He was noted to be bradycardic.  Echo showed EF 55 to 60%, no wall motion abnormality, grade 2 diastolic dysfunction.  Heart monitor was ordered.  This showed predominantly normal sinus rhythm with first-degree AV block, IVCD, 1 pause lasting 3 seconds at 20 bpm, pause occurred due to possible atrial tachycardia with block, second-degree AV block, rare PACs, PVCs of 1.5% burden.  The patient was last seen 01/2023 and was overall feeling OK. He reported occasional SOB. Orthostatics were negative. He was referred to pulmonology for OSA testing.   Today,the patient reports daughter was recently diagnosed with uterine cancer and was unable to make it to the pulmonology appointment.  He denies chest pain, SOB, lower leg edema, lightheadedness, or dizziness.   Studies Reviewed: [name]   EKG Interpretation Date/Time:  Tuesday May 20 2023 14:05:10 EST Ventricular Rate:  65 PR Interval:  288 QRS  Duration:  136 QT Interval:  444 QTC Calculation: 461 R Axis:   40  Text Interpretation: Sinus rhythm with 1st degree A-V block Right bundle branch block When compared with ECG of 12-Feb-2023 13:48, No significant change was found Confirmed by Fransico Michael, Meloney Feld ([id number]) on 05/20/2023 2:07:32 PM   Heart monitor 01/2023 Patch Wear Time:  6 days and 17 hours (2024-10-15T19:45:52-0400 to 2024-10-22T13:06:09-398)   Patient had a min HR of 22 bpm, max HR of 136 bpm, and avg HR of 65 bpm. Predominant underlying rhythm was Sinus Rhythm. First Degree AV Block was present. Bundle Branch Block/IVCD was present. 1 Pause occurred lasting 3 secs (20 bpm). Pause occurred due to possible Atrial Tachycardia with block. Second Degree AV Block-Mobitz I (Wenckebach) was present.  Rare PACs. Occasional PVCs with a burden of 1.5%.  Echo 12/2022  1. Left ventricular ejection fraction, by estimation, is 55 to 60%. The  left ventricle has normal function. The left ventricle has no regional  wall motion abnormalities. There is mild concentric left ventricular  hypertrophy. Left ventricular diastolic  parameters are consistent with Grade II diastolic dysfunction  (pseudonormalization).   2. Right ventricular systolic function is normal. The right ventricular  size is normal. There is normal pulmonary artery systolic pressure.   3. Left atrial size was mildly dilated.   4. Right atrial size was mildly dilated.   5. The mitral valve is normal in structure. Trivial mitral valve  regurgitation. No evidence of mitral stenosis.   6. The  aortic valve is tricuspid. There is mild calcification of the  aortic valve. There is mild thickening of the aortic valve. Aortic valve  regurgitation is trivial. Aortic valve sclerosis/calcification is present,  without any evidence of aortic  stenosis. Aortic valve area, by VTI measures 2.32 cm. Aortic valve mean  gradient measures 4.0 mmHg. Aortic valve Vmax measures 1.54 m/s.   7. The  inferior vena cava is normal in size with greater than 50%  respiratory variability, suggesting right atrial pressure of 3 mmHg.    LHC 06/2020  Ost LAD lesion is 100% stenosed. LIMA to LAD is patent. Dist LAD-1 lesion is 70% stenosed. SVG to first diagonal is occluded. Mid Cx lesion is 30% stenosed. Ost RCA lesion is 100% stenosed. SVG to PDA is patent with 80% proximal stenosis. A drug-eluting stent was successfully placed using a SYNERGY XD [id number], postdilated to > 5 mm and optimized with IVUS. Post intervention, there is a 0% residual stenosis. RPAV lesion is occluded with left to right collaterals. Patent stent in SVG to second diagonal. Mid LM to Ost LAD lesion is 75% stenosed. This has progressed compared to the prior cath. The left ventricular ejection fraction is 45-50% by visual estimate. There is mild left ventricular systolic dysfunction. Anterolateral hypokinesis. LV end diastolic pressure is normal. There is no aortic valve stenosis. Origin to Prox Graft lesion before RPAV is 80% stenosed. And is normal in caliber. The graft exhibits mild .   Continue aggressive secondary prevention.  Consider PCI of left main into circumflex tomorrow.  Could use right radial approach.     Echo 06/2020  1. Left ventricular ejection fraction, by estimation, is 55 to 60%. The  left ventricle has normal function. The left ventricle has no regional  wall motion abnormalities. Left ventricular diastolic parameters were  normal.   2. Right ventricular systolic function is normal. The right ventricular  size is normal. Tricuspid regurgitation signal is inadequate for assessing  PA pressure.   3. The mitral valve is normal in structure. Trivial mitral valve  regurgitation. No evidence of mitral stenosis.   4. The aortic valve is tricuspid. Aortic valve regurgitation is not  visualized. Mild aortic valve sclerosis is present, with no evidence of  aortic valve stenosis.   5. The inferior  vena cava is normal in size with <50% respiratory  variability, suggesting right atrial pressure of 8 mmHg.       Physical Exam:   VS:  BP 126/72 (BP Location: Left Arm, Patient Position: Sitting, Cuff Size: Normal)   Pulse 65   Ht 5\' 9"  (1.753 m)   Wt 182 lb 12.8 oz (82.9 kg)   SpO2 97%   BMI 26.99 kg/m    Wt Readings from Last 3 Encounters:  05/20/23 182 lb 12.8 oz (82.9 kg)  02/12/23 179 lb 6.4 oz (81.4 kg)  12/23/22 175 lb 11.3 oz (79.7 kg)    GEN: Well nourished, well developed in no acute distress NECK: No JVD; No carotid bruits CARDIAC: RRR, no murmurs, rubs, gallops RESPIRATORY:  Clear to auscultation without rales, wheezing or rhonchi  ABDOMEN: Soft, non-tender, non-distended EXTREMITIES:  No edema; No deformity   ASSESSMENT AND PLAN: .    Pre-syncope Patient was admitted 12/2022 for presyncope found to have bradcyardia and hypotension. Echo showed normal LVEF. Heart monitor showed NSR, 1st degree AV block and 3 second pause at night, possible atrial tachycardia, second degree AV Mobitz type 1, rare ectopy. He is not on BB at  baseline. Orthostatics previously negative. He denies any further pre-syncope. He has been referred to OSA testing, but has not gone- he will re-schedule this. No further work-up at this time.  CAD s/p CABG and multiple stents Patient denies chest pain and significant SOB. MOSt recent echo showed normalized EF. He is euvolemic on exam today. Continue Lisinopril 2.5mg  daily.   Nocturnal Pause OSA COPD Patient reported prior dx of sleep apnea. He was referred to pulmonology but canceled appointment. It was recommended he re-schedule this.   Remote Afib Patient has a h/o remote Afib with no documented recurrence. He is in NSR today.     Dispo: Follow-up in 6 months  Signed, Jamonta Goerner David Stall, PA-C

## 2023-05-22 ENCOUNTER — Ambulatory Visit (INDEPENDENT_AMBULATORY_CARE_PROVIDER_SITE_OTHER): Admitting: Sleep Medicine

## 2023-05-22 ENCOUNTER — Encounter: Payer: Self-pay | Admitting: Sleep Medicine

## 2023-05-22 VITALS — BP 122/68 | HR 62 | Ht 69.0 in | Wt 185.0 lb

## 2023-05-22 DIAGNOSIS — R0681 Apnea, not elsewhere classified: Secondary | ICD-10-CM

## 2023-05-22 DIAGNOSIS — F17210 Nicotine dependence, cigarettes, uncomplicated: Secondary | ICD-10-CM

## 2023-05-22 DIAGNOSIS — I10 Essential (primary) hypertension: Secondary | ICD-10-CM | POA: Diagnosis not present

## 2023-05-22 DIAGNOSIS — G4733 Obstructive sleep apnea (adult) (pediatric): Secondary | ICD-10-CM

## 2023-05-22 DIAGNOSIS — I4891 Unspecified atrial fibrillation: Secondary | ICD-10-CM | POA: Diagnosis not present

## 2023-05-22 DIAGNOSIS — I25810 Atherosclerosis of coronary artery bypass graft(s) without angina pectoris: Secondary | ICD-10-CM | POA: Diagnosis not present

## 2023-05-22 NOTE — Progress Notes (Signed)
 Name:Franklin Marks MRN: [id number] DOB: [birth year]   CHIEF COMPLAINT:  EXCESSIVE DAYTIME SLEEPINESS   HISTORY OF PRESENT ILLNESS:  [name] is a 73 y.o. w/ a h/o OSA, atrial fibrillation, CAD s/p CABG, HTN, GERD and anxiety who presents to establish care for OSA. Reports that he was initially diagnosed with moderate OSA (AHI 24, O2 nadir 80%). Reports that CPAP was recommended at that time but he did not proceed with CPAP therapy. Denies any significant weight changes. Reports for c/o loud snoring, witnessed apnea and excessive daytime sleepiness which has been present for several years. Reports nocturnal awakenings due to nocturia, however does not have difficulty falling back to sleep. Admits to night sweats and dry mouth. Denies morning headaches, RLS symptoms, dream enactment, cataplexy, hypnagogic or hypnapompic hallucinations. Reports a family history of sleep apnea. Denies drowsy driving. Drinks 1 cup of coffee daily, denies alcohol use, occasional cigar use, marijuana use 1-2 times per day.    Bedtime 9-10 pm Sleep onset 30 mins Rise time 7:30 am   EPWORTH SLEEP SCORE 6     No data to display          PAST MEDICAL HISTORY :   has a past medical history of Anemia, Atrial fibrillation (HCC) (01/13/2012), CAD (coronary artery disease), COPD (chronic obstructive pulmonary disease) (HCC), GERD (gastroesophageal reflux disease), HFimpEF (heart failure with improved ejection fraction) (HCC), Ischemic cardiomyopathy (01/10/2012), NSTEMI (non-ST elevated myocardial infarction) (HCC) (01/10/2012), and OSA (obstructive sleep apnea).  has a past surgical history that includes Cardiac catheterization (2011); Cardiac catheterization (12/2011); Coronary artery bypass graft (2001); left heart catheterization with coronary angiogram (N/A, 01/10/2012); Cardiac catheterization (N/A, 03/15/2015); Cardiac catheterization (N/A, 03/15/2015); LEFT HEART CATH AND CORS/GRAFTS ANGIOGRAPHY (N/A,  07/10/2020); Coronary Ultrasound/IVUS (N/A, 07/10/2020); CORONARY STENT INTERVENTION (N/A, 07/10/2020); CORONARY STENT INTERVENTION (N/A, 07/26/2020); CORONARY IMAGING/OCT (N/A, 07/26/2020); and ABDOMINAL AORTOGRAM W/LOWER EXTREMITY (N/A, 02/13/2022). Prior to Admission medications   Medication Sig Start Date End Date Taking? Authorizing Provider  aspirin EC 81 MG tablet Take 81 mg by mouth daily. Swallow whole.   Yes [provider]  atorvastatin (LIPITOR) 80 MG tablet TAKE ONE (1) TABLET BY MOUTH EACH EVENING AT 6PM 10/28/22  Yes Hammock, Sheri, NP  clonazePAM (KLONOPIN) 0.5 MG tablet TAKE 1/2 TO 1 TABLET BY MOUTH AT BEDTIME AS NEEDED FOR SLEEP/ANXIETY 04/14/23  Yes Tillman Abide I, MD  clopidogrel (PLAVIX) 75 MG tablet TAKE 1 TABLET BY MOUTH EVERY DAY 04/08/23  Yes Furth, Cadence H, PA-C  isosorbide mononitrate (IMDUR) 30 MG 24 hr tablet TAKE 1 TABLET BY MOUTH EVERY DAY 04/08/23  Yes Furth, Cadence H, PA-C  lisinopril (ZESTRIL) 5 MG tablet TAKE 1/2 TABLET BY MOUTH DAILY 01/08/23  Yes [name], MD  Omega-3 Fatty Acids (FISH OIL) 1000 MG CAPS Take 1,000 mg by mouth in the morning and at bedtime.   Yes [provider]  pantoprazole (PROTONIX) 20 MG tablet TAKE 1 TABLET BY MOUTH EVERY DAY AS NEEDED 10/28/22  Yes [name], MD   Allergies  Allergen Reactions   Codeine Nausea And Vomiting    FAMILY HISTORY:  family history includes Alcohol abuse in his father; Heart disease in his brother, sister, and son; Hyperlipidemia in his brother, father, mother, sister, and sister. SOCIAL HISTORY:  reports that he has been smoking cigarettes and cigars. He started smoking about 53 years ago. He has a 45 pack-year smoking history. He has been exposed to tobacco smoke. He has never  used smokeless tobacco. He reports that he does not currently use alcohol. He reports current drug use. Drug: Marijuana.   Review of Systems:  Gen:  Denies  fever, sweats, chills weight loss  HEENT:  Denies blurred vision, double vision, ear pain, eye pain, hearing loss, nose bleeds, sore throat Cardiac:  No dizziness, chest pain or heaviness, chest tightness,edema, No JVD Resp:   No cough, -sputum production, -shortness of breath,-wheezing, -hemoptysis,  Gi: Denies swallowing difficulty, stomach pain, nausea or vomiting, diarrhea, constipation, bowel incontinence Gu:  Denies bladder incontinence, burning urine Ext:   Denies Joint pain, stiffness or swelling Skin: Denies  skin rash, easy bruising or bleeding or hives Endoc:  Denies polyuria, polydipsia , polyphagia or weight change Psych:   Denies depression, insomnia or hallucinations  Other:  All other systems negative  VITAL SIGNS: BP 122/68   Pulse 62   Ht 5\' 9"  (1.753 m)   Wt 185 lb (83.9 kg)   SpO2 97%   BMI 27.32 kg/m    Physical Examination:   General Appearance: No distress  EYES PERRLA, EOM intact.   NECK Supple, No JVD Pulmonary: normal breath sounds, No wheezing.  CardiovascularNormal S1,S2.  No m/r/g.   Abdomen: Benign, Soft, non-tender. Skin:   warm, no rashes, no ecchymosis  Extremities: normal, no cyanosis, clubbing. Neuro:without focal findings,  speech normal  PSYCHIATRIC: Mood, affect within normal limits.   ASSESSMENT AND PLAN  OSA Will reassess apnea with HST. Discussed the consequences of untreated sleep apnea. Advised not to drive drowsy for safety of patient and others. Will complete further evaluation with a home sleep study and follow up to review results.    CAD Stable, following with Cardiology.   HTN Stable, on current management. Following with PCP.   Atrial fibrillation Stable, on current management.    MEDICATION ADJUSTMENTS/LABS AND TESTS ORDERED: Recommend Sleep Study   Patient  satisfied with Plan of action and management. All questions answered  Follow up to review HST results and treatment plan.   I spent a total of 46 minutes reviewing chart data, face-to-face  evaluation with the patient, counseling and coordination of care as detailed above.    Tempie Hoist, M.D.  Sleep Medicine [facility]

## 2023-05-22 NOTE — Patient Instructions (Signed)
 Franklin Marks

## 2023-07-03 ENCOUNTER — Other Ambulatory Visit: Payer: Self-pay | Admitting: Internal Medicine

## 2023-07-03 ENCOUNTER — Other Ambulatory Visit: Payer: Self-pay | Admitting: Medical

## 2023-07-03 NOTE — Telephone Encounter (Signed)
 Last filled 04-14-23 #30 Last OV 09-23-22 Next OV 7-10-5 [facility]

## 2023-09-05 ENCOUNTER — Other Ambulatory Visit: Payer: Self-pay | Admitting: Medical

## 2023-09-05 ENCOUNTER — Other Ambulatory Visit: Payer: Self-pay | Admitting: Cardiology

## 2023-09-05 ENCOUNTER — Other Ambulatory Visit: Payer: Self-pay | Admitting: Internal Medicine

## 2023-09-05 ENCOUNTER — Ambulatory Visit (INDEPENDENT_AMBULATORY_CARE_PROVIDER_SITE_OTHER)

## 2023-09-05 VITALS — BP 124/76 | Ht 69.0 in | Wt 176.2 lb

## 2023-09-05 DIAGNOSIS — Z122 Encounter for screening for malignant neoplasm of respiratory organs: Secondary | ICD-10-CM

## 2023-09-05 DIAGNOSIS — Z1211 Encounter for screening for malignant neoplasm of colon: Secondary | ICD-10-CM

## 2023-09-05 DIAGNOSIS — Z0000 Encounter for general adult medical examination without abnormal findings: Secondary | ICD-10-CM

## 2023-09-05 NOTE — Progress Notes (Addendum)
 Subjective:   Franklin Marks is a 73 y.o. who presents for a Medicare Wellness preventive visit.  As a reminder, Annual Wellness Visits don't include a physical exam, and some assessments may be limited, especially if this visit is performed virtually. We may recommend an in-person follow-up visit with your provider if needed.  Visit Complete: In person  Persons Participating in Visit: Patient.  AWV Questionnaire: No: Patient Medicare AWV questionnaire was not completed prior to this visit.  Cardiac Risk Factors include: advanced age (>82men, >14 women);dyslipidemia;male gender;sedentary lifestyle     Objective:    Today's Vitals   09/05/23 0816  Weight: 176 lb 3.2 oz (79.9 kg)  Height: 5' 9 (1.753 m)   Body mass index is 26.02 kg/m.     09/05/2023    8:32 AM 12/23/2022   10:01 AM 12/22/2022    5:04 PM 02/13/2022   10:01 AM 07/26/2020   11:28 AM 07/08/2020    1:30 AM 07/07/2020   10:15 PM  Advanced Directives  Does Patient Have a Medical Advance Directive? No No No Yes No No No  Type of Advance Directive    Living will     Does patient want to make changes to medical advance directive?    No - Patient declined     Would patient like information on creating a medical advance directive?  No - Patient declined   No - Patient declined No - Patient declined     Current Medications (verified) Outpatient Encounter Medications as of 09/05/2023  Medication Sig   aspirin  EC 81 MG tablet Take 81 mg by mouth daily. Swallow whole.   atorvastatin  (LIPITOR ) 80 MG tablet TAKE ONE (1) TABLET BY MOUTH EACH EVENING AT 6PM   clonazePAM  (KLONOPIN ) 0.5 MG tablet TAKE 1/2 TO 1 TABLET BY MOUTH AT BEDTIME AS NEEDED FOR SLEEP/ANXIETY   clopidogrel  (PLAVIX ) 75 MG tablet TAKE 1 TABLET BY MOUTH EVERY DAY   Coenzyme Q10 (CO Q-10) 200 MG CAPS Take 200 capsules by mouth 1 day or 1 dose.   isosorbide  mononitrate (IMDUR ) 30 MG 24 hr tablet TAKE 1 TABLET BY MOUTH EVERY DAY   lisinopril  (ZESTRIL ) 5 MG  tablet TAKE 1/2 TABLET BY MOUTH DAILY   Misc Natural Products (YUMVS BEET ROOT-TART CHERRY) 250-0.5 MG CHEW Chew 2 Containers by mouth 1 day or 1 dose.   Omega-3 Fatty Acids (FISH OIL ) 1000 MG CAPS Take 1,000 mg by mouth in the morning and at bedtime.   pantoprazole  (PROTONIX ) 20 MG tablet TAKE 1 TABLET BY MOUTH EVERY DAY AS NEEDED   No facility-administered encounter medications on file as of 09/05/2023.    Allergies (verified) Codeine   History: Past Medical History:  Diagnosis Date   Anemia    Atrial fibrillation (HCC) 01/13/2012   CAD (coronary artery disease)    a. 2001 s/p CABGx5 (LIMA->LAD, VG->D1, VG->D2, VG->RPAV->RPDA; b. 02/2015 NSTEMI/PCI: VG->D2 95 (3.6x16 Promus DES). VG->D1 100; c. 06/2020 PCI: LM 75, LAD 100ost, D1 70, D2 70, LCX 74m, RCA 100ost, RPAV 100, VG->RPAV->RPDA 80ost (4.5x12 Synergy DES), VG->D1 100, VG->D2 ok, LIMA->LAD ok; d. 07/2020 PCI LM/LCX (2.5x15 Resolute Onyx DES).   COPD (chronic obstructive pulmonary disease) (HCC)    ongoing tobacco use   GERD (gastroesophageal reflux disease)    HFimpEF (heart failure with improved ejection fraction) (HCC)    a. 12/2011 Echo: EF 35-40%; b. 06/2020 Echo: EF 55-60%   Ischemic cardiomyopathy 01/10/2012   a. 12/2011 Echo: EF 35-40%, mild conc LVH,  sev HK of anteroseptum, borderline RVH; b. 06/2020 Echo: EF 55-60%, no rwma, triv MR. Mild Ao sclerosis.   NSTEMI (non-ST elevated myocardial infarction) (HCC) 01/10/2012   Associated with ventricular fibrillation   OSA (obstructive sleep apnea)    a. 07/2020 WatchPat1: 1. Moderate Obstructive Sleep Apnea with AHI 16.5/hr. O2 sat down to 80%.   Past Surgical History:  Procedure Laterality Date   ABDOMINAL AORTOGRAM W/LOWER EXTREMITY N/A 02/13/2022   Procedure: ABDOMINAL AORTOGRAM W/LOWER EXTREMITY;  Surgeon: Kayla Part, MD;  Location: [facility] INVASIVE CV LAB;  Service: Cardiovascular;  Laterality: N/A;   CARDIAC CATHETERIZATION  2011   At [facility]   CARDIAC CATHETERIZATION   12/2011   prox LAD occlusion, ostial RCA occlusion, 30% prox LCx stenosis, LIMA-LAD: 80% post-anastamosis lesion, SVG-Dx2: old occlusion w/ thrombus (noted on prior 2011 cath at [facility]), SVG-Dx: patent, SVG-RCA: patent, diffuse irregs, 80-90% PDA/PLA lesions patent   CARDIAC CATHETERIZATION N/A 03/15/2015   Procedure: Left Heart Cath and Cors/Grafts Angiography;  Surgeon: Odie Benne, MD;  Location: [facility] INVASIVE CV LAB;  Service: Cardiovascular;  Laterality: N/A;   CARDIAC CATHETERIZATION N/A 03/15/2015   Procedure: Coronary Stent Intervention;  Surgeon: Odie Benne, MD;  Location: [facility] INVASIVE CV LAB;  Service: Cardiovascular;  Laterality: N/A;  svg to diagnoal 2   CORONARY ARTERY BYPASS GRAFT  2001   CORONARY IMAGING/OCT N/A 07/26/2020   Procedure: INTRAVASCULAR IMAGING/OCT;  Surgeon: Wenona Hamilton, MD;  Location: [facility] INVASIVE CV LAB;  Service: Cardiovascular;  Laterality: N/A;   CORONARY STENT INTERVENTION N/A 07/10/2020   Procedure: CORONARY STENT INTERVENTION;  Surgeon: Lucendia Rusk, MD;  Location: [facility] INVASIVE CV LAB;  Service: Cardiovascular;  Laterality: N/A;   CORONARY STENT INTERVENTION N/A 07/26/2020   Procedure: CORONARY STENT INTERVENTION;  Surgeon: Wenona Hamilton, MD;  Location: [facility] INVASIVE CV LAB;  Service: Cardiovascular;  Laterality: N/A;   CORONARY ULTRASOUND/IVUS N/A 07/10/2020   Procedure: Intravascular Ultrasound/IVUS;  Surgeon: Lucendia Rusk, MD;  Location: [facility] INVASIVE CV LAB;  Service: Cardiovascular;  Laterality: N/A;   LEFT HEART CATH AND CORS/GRAFTS ANGIOGRAPHY N/A 07/10/2020   Procedure: LEFT HEART CATH AND CORS/GRAFTS ANGIOGRAPHY;  Surgeon: Lucendia Rusk, MD;  Location: [facility] INVASIVE CV LAB;  Service: Cardiovascular;  Laterality: N/A;   LEFT HEART CATHETERIZATION WITH CORONARY ANGIOGRAM N/A 01/10/2012   Procedure: LEFT HEART CATHETERIZATION WITH CORONARY ANGIOGRAM;  Surgeon: Millicent Ally, MD;  Location: [facility] CATH LAB;  Service:  Cardiovascular;  Laterality: N/A;   Family History  Problem Relation Age of Onset   Hyperlipidemia Mother    Alcohol abuse Father    Hyperlipidemia Father    Heart disease Sister    Hyperlipidemia Sister    Hyperlipidemia Sister    Heart disease Brother    Hyperlipidemia Brother    Heart disease Son    Diabetes Neg Hx    Hypertension Neg Hx    Social History   Socioeconomic History   Marital status: Married    Spouse name: Not on file   Number of children: 4   Years of education: Not on file   Highest education level: Not on file  Occupational History   Occupation: Management --now disabled   Occupation: Holiday representative work    Comment: Part--time  Tobacco Use   Smoking status: Some Days    Current packs/day: 0.00    Average packs/day: 1 pack/day for 45.0 years (45.0 ttl pk-yrs)    Types: Cigarettes, Cigars    Start date: 03/12/1970  Last attempt to quit: 03/13/2015    Years since quitting: 8.4    Passive exposure: Current (as a child)   Smokeless tobacco: Never  Vaping Use   Vaping status: Never Used  Substance and Sexual Activity   Alcohol use: Not Currently    Comment: really rare beer   Drug use: Yes    Types: Marijuana   Sexual activity: Not Currently  Other Topics Concern   Not on file  Social History Narrative   Married twice   3 children from 1st marriage-- 1 from second   Worked as Investment banker, operational      Has living will   Wife has health care POA---alternate would be daughter Trevor Fudge   Would accept resuscitation--- but no prolonged ventilation   No tube feeds if cognitively unaware   Social Drivers of Health   Financial Resource Strain: Low Risk  (09/05/2023)   Overall Financial Resource Strain (CARDIA)    Difficulty of Paying Living Expenses: Not hard at all  Food Insecurity: No Food Insecurity (09/05/2023)   Hunger Vital Sign    Worried About Running Out of Food in the Last Year: Never true    Ran Out of Food in the Last Year: Never true   Transportation Needs: No Transportation Needs (09/05/2023)   PRAPARE - Administrator, Civil Service (Medical): No    Lack of Transportation (Non-Medical): No  Physical Activity: Inactive (09/05/2023)   Exercise Vital Sign    Days of Exercise per Week: 0 days    Minutes of Exercise per Session: 0 min  Stress: No Stress Concern Present (09/05/2023)   [employer] of Occupational Health - Occupational Stress Questionnaire    Feeling of Stress: Not at all  Social Connections: Moderately Isolated (09/05/2023)   Social Connection and Isolation Panel    Frequency of Communication with Friends and Family: More than three times a week    Frequency of Social Gatherings with Friends and Family: Once a week    Attends Religious Services: Never    Database administrator or Organizations: No    Attends Engineer, structural: Never    Marital Status: Married    Tobacco Counseling Ready to quit: Not Answered Counseling given: Not Answered    Clinical Intake:  Pre-visit preparation completed: Yes  Pain : No/denies pain     BMI - recorded: 26.02 Nutritional Status: BMI 25 -29 Overweight Nutritional Risks: None Diabetes: No  Lab Results  Component Value Date   HGBA1C 5.9 (H) 07/08/2020   HGBA1C 5.7 (H) 03/14/2015   HGBA1C 5.7 (H) 01/12/2012     How often do you need to have someone help you when you read instructions, pamphlets, or other written materials from your doctor or pharmacy?: 1 - Never  Interpreter Needed?: No  Comments: lives with wife Information entered by :: B.Caylan Chenard,LPN   Activities of Daily Living     09/05/2023    8:33 AM 12/23/2022   10:01 AM  In your present state of health, do you have any difficulty performing the following activities:  Hearing? 0 0  Vision? 0 0  Difficulty concentrating or making decisions? 0 0  Walking or climbing stairs? 0   Dressing or bathing? 0   Doing errands, shopping? 0 0  Preparing Food and eating ?  N   Using the Toilet? N   In the past six months, have you accidently leaked urine? N   Do you have problems with loss of  bowel control? N   Managing your Medications? N   Managing your Finances? N   Housekeeping or managing your Housekeeping? N     Patient Care Team: Helaine Llanos, MD as PCP - General (Pediatrics) Wenona Hamilton, MD as PCP - Cardiology (Cardiology)  I have updated your Care Teams any recent Medical Services you may have received from other providers in the past year.     Assessment:   This is a routine wellness examination for Franklin Marks.  Hearing/Vision screen Hearing Screening - Comments:: Pt says he has no problems hearing Vision Screening - Comments:: Pt says his vision is good No eye dr-declined referral to eye provider   Goals Addressed             This Visit's Progress    Patient Stated       I would like stay active  and healthy       Depression Screen     09/05/2023    8:28 AM 09/23/2022   12:17 PM 09/23/2022   11:32 AM 09/14/2021    9:26 AM 08/01/2020    9:01 AM 07/22/2018    9:46 AM 07/14/2017    3:45 PM  PHQ 2/9 Scores  PHQ - 2 Score 0 0 0 0 0 0 0    Fall Risk     09/05/2023    8:20 AM 09/23/2022   12:17 PM 09/23/2022   11:32 AM 09/14/2021    9:26 AM 08/01/2020    9:01 AM  Fall Risk   Falls in the past year? 0 0 0 0 0  Number falls in past yr: 0  0    Injury with Fall? 0  0    Risk for fall due to : No Fall Risks  No Fall Risks    Follow up Education provided;Falls prevention discussed  Falls evaluation completed      MEDICARE RISK AT HOME:  Medicare Risk at Home Any stairs in or around the home?: Yes If so, are there any without handrails?: Yes Home free of loose throw rugs in walkways, pet beds, electrical cords, etc?: Yes Adequate lighting in your home to reduce risk of falls?: Yes Life alert?: No Use of a cane, walker or w/c?: No Grab bars in the bathroom?: No Shower chair or bench in shower?: No Elevated toilet seat or a  handicapped toilet?: Yes  TIMED UP AND GO:  Was the test performed?  Yes  Length of time to ambulate 10 feet: 10 sec Gait steady and fast without use of assistive device  Cognitive Function: 6CIT completed        09/05/2023    8:35 AM  6CIT Screen  What Year? 0 points  What month? 0 points  What time? 0 points  Count back from 20 0 points  Months in reverse 0 points  Repeat phrase 6 points  Total Score 6 points    Immunizations Immunization History  Administered Date(s) Administered   Fluad Quad(high Dose 65+) 03/21/2020   Influenza Split 01/12/2012   Influenza, High Dose Seasonal PF 01/07/2019, 02/14/2023   Influenza,inj,Quad PF,6+ Mos 03/03/2013, 06/16/2014, 03/16/2015   Influenza-Unspecified 02/26/2021   PFIZER(Purple Top)SARS-COV-2 Vaccination 05/14/2019, 06/09/2019, 03/09/2020   Pneumococcal Conjugate-13 07/11/2016   Pneumococcal Polysaccharide-23 01/12/2012, 08/27/2017   Rsv, Bivalent, Protein Subunit Rsvpref,pf Pattricia Bores) 02/14/2023   Tdap 06/10/2012    Screening Tests Health Maintenance  Topic Date Due   DTaP/Tdap/Td (2 - Td or Tdap) 06/11/2022   Lung Cancer Screening  09/16/2023   Colonoscopy  09/23/2023 (Originally 02/03/1996)   Zoster Vaccines- Shingrix (1 of 2) 09/23/2023 (Originally 02/02/2001)   INFLUENZA VACCINE  10/17/2023   Medicare Annual Wellness (AWV)  09/04/2024   Pneumococcal Vaccine: 50+ Years  Completed   Hepatitis C Screening  Completed   HPV VACCINES  Aged Out   Meningococcal B Vaccine  Aged Out   COVID-19 Vaccine  Discontinued    Health Maintenance  Health Maintenance Due  Topic Date Due   DTaP/Tdap/Td (2 - Td or Tdap) 06/11/2022   Lung Cancer Screening  09/16/2023   Health Maintenance Items Addressed: Cologuard Ordered, Referral sent to [facility] (smoker/hx smoking)  Additional Screening:  Vision Screening: Recommended annual ophthalmology exams for early detection of glaucoma and other disorders of the eye. Would  you like a referral to an eye doctor? No  pt declines eye referral   Dental Screening: Recommended annual dental exams for proper oral hygiene  Community Resource Referral / Chronic Care Management: CRR required this visit?  No   CCM required this visit?  No   Plan:    I have personally reviewed and noted the following in the patient's chart:   Medical and social history Use of alcohol, tobacco or illicit drugs  Current medications and supplements including opioid prescriptions. Patient is not currently taking opioid prescriptions. Functional ability and status Nutritional status Physical activity Advanced directives List of other physicians Hospitalizations, surgeries, and ER visits in previous 12 months Vitals Screenings to include cognitive, depression, and falls Referrals and appointments  In addition, I have reviewed and discussed with patient certain preventive protocols, quality metrics, and best practice recommendations. A written personalized care plan for preventive services as well as general preventive health recommendations were provided to patient.   Nerissa Bannister, LPN   06/24/8117   After Visit Summary: Pt declined printed AVS in office: will see on Mychart  Notes: Nothing significant to report at this time.

## 2023-09-05 NOTE — Patient Instructions (Signed)
 [name] , Thank you for taking time out of your busy schedule to complete your Annual Wellness Visit with me. I enjoyed our conversation and look forward to speaking with you again next year. I, as well as your care team,  appreciate your ongoing commitment to your health goals. Please review the following plan we discussed and let me know if I can assist you in the future. Your Game plan/ To Do List    Referrals: If you haven't heard from the office you've been referred to, please reach out to them at the phone provided.  An order has been placed for a Cologuard for you. They will mail you the kit with instructions on how to obtain the sample and send it back in to be tested. If you do not received your kit, please call our office and let us  know.    PLEASE CALL AND SCHEDULE your lung cancer screening before September 16, 2023 at [phone] Date: 09/20/2022 Department: [facility] at [facility] Ordering/Authorizing: Raejean Bullock, NP    Follow up Visits: Next Medicare AWV with our clinical staff: 09/07/24 @ 8:50am   Have you seen your provider in the last 6 months (3 months if uncontrolled diabetes)? No Next Office Visit with your provider: 09/25/23 @ 8:15am CPE  Clinician Recommendations:  Aim for 30 minutes of exercise or brisk walking, 6-8 glasses of water, and 5 servings of fruits and vegetables each day.       This is a list of the screening recommended for you and due dates:  Health Maintenance  Topic Date Due   DTaP/Tdap/Td vaccine (2 - Td or Tdap) 06/11/2022   Screening for Lung Cancer  09/16/2023   Colon Cancer Screening  09/23/2023*   Zoster (Shingles) Vaccine (1 of 2) 09/23/2023*   Flu Shot  10/17/2023   Medicare Annual Wellness Visit  09/04/2024   Pneumococcal Vaccine for age over 23  Completed   Hepatitis C Screening  Completed   HPV Vaccine  Aged Out   Meningitis B Vaccine  Aged Out   COVID-19 Vaccine  Discontinued  *Topic was postponed. The date  shown is not the original due date.    Advanced directives: (Declined) Advance directive discussed with you today. Even though you declined this today, please call our office should you change your mind, and we can give you the proper paperwork for you to fill out. Advance Care Planning is important because it:  [x]  Makes sure you receive the medical care that is consistent with your values, goals, and preferences  [x]  It provides guidance to your family and loved ones and reduces their decisional burden about whether or not they are making the right decisions based on your wishes.  Follow the link provided in your after visit summary or read over the paperwork we have mailed to you to help you started getting your Advance Directives in place. If you need assistance in completing these, please reach out to us  so that we can help you!

## 2023-09-05 NOTE — Telephone Encounter (Signed)
 Last filled 07-04-23 #30 Last OV 09-23-22 Next OV 7-10-5 [facility]

## 2023-09-13 DIAGNOSIS — Z1211 Encounter for screening for malignant neoplasm of colon: Secondary | ICD-10-CM | POA: Diagnosis not present

## 2023-09-15 ENCOUNTER — Telehealth: Payer: Self-pay | Admitting: Internal Medicine

## 2023-09-15 NOTE — Telephone Encounter (Signed)
 Patient dropped off cologuard box at Provider office Contacted cologuard and scheduled a pick-up for today by [employer] Pick up #[phone]

## 2023-09-19 LAB — COLOGUARD: COLOGUARD: NEGATIVE

## 2023-09-22 ENCOUNTER — Ambulatory Visit: Payer: Self-pay | Admitting: Internal Medicine

## 2023-09-25 ENCOUNTER — Other Ambulatory Visit: Payer: Self-pay

## 2023-09-25 ENCOUNTER — Ambulatory Visit: Payer: PPO | Admitting: Internal Medicine

## 2023-09-25 ENCOUNTER — Encounter: Payer: Self-pay | Admitting: Internal Medicine

## 2023-09-25 ENCOUNTER — Ambulatory Visit: Payer: Self-pay | Admitting: Internal Medicine

## 2023-09-25 VITALS — BP 124/70 | HR 52 | Ht 69.0 in | Wt 175.0 lb

## 2023-09-25 DIAGNOSIS — Z87891 Personal history of nicotine dependence: Secondary | ICD-10-CM

## 2023-09-25 DIAGNOSIS — I739 Peripheral vascular disease, unspecified: Secondary | ICD-10-CM

## 2023-09-25 DIAGNOSIS — Z0000 Encounter for general adult medical examination without abnormal findings: Secondary | ICD-10-CM | POA: Diagnosis not present

## 2023-09-25 DIAGNOSIS — J449 Chronic obstructive pulmonary disease, unspecified: Secondary | ICD-10-CM

## 2023-09-25 DIAGNOSIS — I25119 Atherosclerotic heart disease of native coronary artery with unspecified angina pectoris: Secondary | ICD-10-CM | POA: Diagnosis not present

## 2023-09-25 DIAGNOSIS — Z122 Encounter for screening for malignant neoplasm of respiratory organs: Secondary | ICD-10-CM

## 2023-09-25 LAB — COMPREHENSIVE METABOLIC PANEL WITH GFR
ALT: 12 U/L (ref 0–53)
AST: 12 U/L (ref 0–37)
Albumin: 4.3 g/dL (ref 3.5–5.2)
Alkaline Phosphatase: 145 U/L — ABNORMAL HIGH (ref 39–117)
BUN: 15 mg/dL (ref 6–23)
CO2: 30 meq/L (ref 19–32)
Calcium: 9.2 mg/dL (ref 8.4–10.5)
Chloride: 103 meq/L (ref 96–112)
Creatinine, Ser: 0.96 mg/dL (ref 0.40–1.50)
GFR: 78.89 mL/min (ref >60.00)
Glucose, Bld: 98 mg/dL (ref 70–99)
Potassium: 4.1 meq/L (ref 3.5–5.1)
Sodium: 138 meq/L (ref 135–145)
Total Bilirubin: 0.6 mg/dL (ref 0.2–1.2)
Total Protein: 7.1 g/dL (ref 6.0–8.3)

## 2023-09-25 LAB — LIPID PANEL
Cholesterol: 129 mg/dL (ref 0–200)
HDL: 31.3 mg/dL — ABNORMAL LOW (ref >39.00)
LDL Cholesterol: 33 mg/dL (ref 0–99)
NonHDL: 98.18
Total CHOL/HDL Ratio: 4
Triglycerides: 327 mg/dL — ABNORMAL HIGH (ref 0.0–149.0)
VLDL: 65.4 mg/dL — ABNORMAL HIGH (ref 0.0–40.0)

## 2023-09-25 LAB — CBC
HCT: 39.3 % (ref 39.0–52.0)
Hemoglobin: 13.4 g/dL (ref 13.0–17.0)
MCHC: 34.1 g/dL (ref 30.0–36.0)
MCV: 89.9 fl (ref 78.0–100.0)
Platelets: 163 K/uL (ref 150.0–400.0)
RBC: 4.37 Mil/uL (ref 4.22–5.81)
RDW: 14.1 % (ref 11.5–15.5)
WBC: 8.5 K/uL (ref 4.0–10.5)

## 2023-09-25 NOTE — Assessment & Plan Note (Signed)
 No chest pain on the isosorbide  30,---lisinopril  5, statin

## 2023-09-25 NOTE — Progress Notes (Signed)
 Subjective:    Patient ID: Franklin Marks, male    DOB: 07/12/1950, 73 y.o.   MRN: [id number]  HPI Here for physical  Still smokes cigar at times Not much exercise--- does golf. Discussed  No chest pain or SOB No change in exercise tolerance No dizziness or syncope No edema No claudication since stent inserted  Current Outpatient Medications on File Prior to Visit  Medication Sig Dispense Refill   aspirin  EC 81 MG tablet Take 81 mg by mouth daily. Swallow whole.     atorvastatin  (LIPITOR ) 80 MG tablet TAKE ONE (1) TABLET BY MOUTH EACH EVENING AT 6PM 90 tablet 3   clonazePAM  (KLONOPIN ) 0.5 MG tablet TAKE 1/2 TO 1 TABLET BY MOUTH AT BEDTIME AS NEEDED FOR SLEEP/ANXIETY 30 tablet 0   clopidogrel  (PLAVIX ) 75 MG tablet TAKE 1 TABLET BY MOUTH EVERY DAY 90 tablet 3   Coenzyme Q10 (CO Q-10) 200 MG CAPS Take 200 capsules by mouth 1 day or 1 dose.     isosorbide  mononitrate (IMDUR ) 30 MG 24 hr tablet TAKE 1 TABLET BY MOUTH EVERY DAY 90 tablet 3   lisinopril  (ZESTRIL ) 5 MG tablet TAKE 1/2 TABLET BY MOUTH DAILY 45 tablet 3   Misc Natural Products (YUMVS BEET ROOT-TART CHERRY) 250-0.5 MG CHEW Chew 2 Containers by mouth 1 day or 1 dose.     Omega-3 Fatty Acids (FISH OIL ) 1000 MG CAPS Take 1,000 mg by mouth in the morning and at bedtime.     pantoprazole  (PROTONIX ) 20 MG tablet TAKE 1 TABLET BY MOUTH EVERY DAY AS NEEDED 90 tablet 3   No current facility-administered medications on file prior to visit.    Allergies  Allergen Reactions   Codeine Nausea And Vomiting    Past Medical History:  Diagnosis Date   Anemia    Atrial fibrillation (HCC) 01/13/2012   CAD (coronary artery disease)    a. 2001 s/p CABGx5 (LIMA->LAD, VG->D1, VG->D2, VG->RPAV->RPDA; b. 02/2015 NSTEMI/PCI: VG->D2 95 (3.6x16 Promus DES). VG->D1 100; c. 06/2020 PCI: LM 75, LAD 100ost, D1 70, D2 70, LCX 39m, RCA 100ost, RPAV 100, VG->RPAV->RPDA 80ost (4.5x12 Synergy DES), VG->D1 100, VG->D2 ok, LIMA->LAD ok; d. 07/2020 PCI LM/LCX  (2.5x15 Resolute Onyx DES).   COPD (chronic obstructive pulmonary disease) (HCC)    ongoing tobacco use   GERD (gastroesophageal reflux disease)    HFimpEF (heart failure with improved ejection fraction) (HCC)    a. 12/2011 Echo: EF 35-40%; b. 06/2020 Echo: EF 55-60%   Ischemic cardiomyopathy 01/10/2012   a. 12/2011 Echo: EF 35-40%, mild conc LVH,  sev HK of anteroseptum, borderline RVH; b. 06/2020 Echo: EF 55-60%, no rwma, triv MR. Mild Ao sclerosis.   NSTEMI (non-ST elevated myocardial infarction) (HCC) 01/10/2012   Associated with ventricular fibrillation   OSA (obstructive sleep apnea)    a. 07/2020 WatchPat1: 1. Moderate Obstructive Sleep Apnea with AHI 16.5/hr. O2 sat down to 80%.    Past Surgical History:  Procedure Laterality Date   ABDOMINAL AORTOGRAM W/LOWER EXTREMITY N/A 02/13/2022   Procedure: ABDOMINAL AORTOGRAM W/LOWER EXTREMITY;  Surgeon: Lanis Fonda BRAVO, MD;  Location: [facility] INVASIVE CV LAB;  Service: Cardiovascular;  Laterality: N/A;   CARDIAC CATHETERIZATION  2011   At [facility]   CARDIAC CATHETERIZATION  12/2011   prox LAD occlusion, ostial RCA occlusion, 30% prox LCx stenosis, LIMA-LAD: 80% post-anastamosis lesion, SVG-Dx2: old occlusion w/ thrombus (noted on prior 2011 cath at [facility]), SVG-Dx: patent, SVG-RCA: patent, diffuse irregs, 80-90% PDA/PLA lesions patent   CARDIAC  CATHETERIZATION N/A 03/15/2015   Procedure: Left Heart Cath and Cors/Grafts Angiography;  Surgeon: Lonni JONETTA Cash, MD;  Location: [facility] INVASIVE CV LAB;  Service: Cardiovascular;  Laterality: N/A;   CARDIAC CATHETERIZATION N/A 03/15/2015   Procedure: Coronary Stent Intervention;  Surgeon: Lonni JONETTA Cash, MD;  Location: [facility] INVASIVE CV LAB;  Service: Cardiovascular;  Laterality: N/A;  svg to diagnoal 2   CORONARY ARTERY BYPASS GRAFT  2001   CORONARY IMAGING/OCT N/A 07/26/2020   Procedure: INTRAVASCULAR IMAGING/OCT;  Surgeon: Darron Deatrice LABOR, MD;  Location: [facility] INVASIVE CV LAB;  Service:  Cardiovascular;  Laterality: N/A;   CORONARY STENT INTERVENTION N/A 07/10/2020   Procedure: CORONARY STENT INTERVENTION;  Surgeon: Dann Candyce RAMAN, MD;  Location: [facility] INVASIVE CV LAB;  Service: Cardiovascular;  Laterality: N/A;   CORONARY STENT INTERVENTION N/A 07/26/2020   Procedure: CORONARY STENT INTERVENTION;  Surgeon: Darron Deatrice LABOR, MD;  Location: [facility] INVASIVE CV LAB;  Service: Cardiovascular;  Laterality: N/A;   CORONARY ULTRASOUND/IVUS N/A 07/10/2020   Procedure: Intravascular Ultrasound/IVUS;  Surgeon: Dann Candyce RAMAN, MD;  Location: [facility] INVASIVE CV LAB;  Service: Cardiovascular;  Laterality: N/A;   LEFT HEART CATH AND CORS/GRAFTS ANGIOGRAPHY N/A 07/10/2020   Procedure: LEFT HEART CATH AND CORS/GRAFTS ANGIOGRAPHY;  Surgeon: Dann Candyce RAMAN, MD;  Location: [facility] INVASIVE CV LAB;  Service: Cardiovascular;  Laterality: N/A;   LEFT HEART CATHETERIZATION WITH CORONARY ANGIOGRAM N/A 01/10/2012   Procedure: LEFT HEART CATHETERIZATION WITH CORONARY ANGIOGRAM;  Surgeon: Debby LABOR Sor, MD;  Location: [facility] CATH LAB;  Service: Cardiovascular;  Laterality: N/A;    Family History  Problem Relation Age of Onset   Hyperlipidemia Mother    Alcohol abuse Father    Hyperlipidemia Father    Heart disease Sister    Hyperlipidemia Sister    Hyperlipidemia Sister    Heart disease Brother    Hyperlipidemia Brother    Heart disease Son    Diabetes Neg Hx    Hypertension Neg Hx     Social History   Socioeconomic History   Marital status: Married    Spouse name: Not on file   Number of children: 4   Years of education: Not on file   Highest education level: Not on file  Occupational History   Occupation: Management --now disabled   Occupation: Holiday representative work    Comment: Part--time  Tobacco Use   Smoking status: Some Days    Current packs/day: 0.00    Average packs/day: 1 pack/day for 45.0 years (45.0 ttl pk-yrs)    Types: Cigarettes, Cigars    Start date: 03/12/1970    Last  attempt to quit: 03/13/2015    Years since quitting: 8.5    Passive exposure: Current (as a child)   Smokeless tobacco: Never  Vaping Use   Vaping status: Never Used  Substance and Sexual Activity   Alcohol use: Not Currently    Comment: really rare beer   Drug use: Yes    Types: Marijuana   Sexual activity: Not Currently  Other Topics Concern   Not on file  Social History Narrative   Married twice   3 children from 1st marriage-- 1 from second   Worked as Investment banker, operational      Has living will   Wife has health care POA---alternate would be daughter Corean   Would accept resuscitation--- but no prolonged ventilation   No tube feeds if cognitively unaware   Social Drivers of Health   Financial Resource Strain: Low Risk  (09/05/2023)  Overall Financial Resource Strain (CARDIA)    Difficulty of Paying Living Expenses: Not hard at all  Food Insecurity: No Food Insecurity (09/05/2023)   Hunger Vital Sign    Worried About Running Out of Food in the Last Year: Never true    Ran Out of Food in the Last Year: Never true  Transportation Needs: No Transportation Needs (09/05/2023)   PRAPARE - Administrator, Civil Service (Medical): No    Lack of Transportation (Non-Medical): No  Physical Activity: Inactive (09/05/2023)   Exercise Vital Sign    Days of Exercise per Week: 0 days    Minutes of Exercise per Session: 0 min  Stress: No Stress Concern Present (09/05/2023)   [employer] of Occupational Health - Occupational Stress Questionnaire    Feeling of Stress: Not at all  Social Connections: Moderately Isolated (09/05/2023)   Social Connection and Isolation Panel    Frequency of Communication with Friends and Family: More than three times a week    Frequency of Social Gatherings with Friends and Family: Once a week    Attends Religious Services: Never    Database administrator or Organizations: No    Attends Banker Meetings: Never    Marital  Status: Married  Catering manager Violence: Not At Risk (09/05/2023)   Humiliation, Afraid, Rape, and Kick questionnaire    Fear of Current or Ex-Partner: No    Emotionally Abused: No    Physically Abused: No    Sexually Abused: No   Review of Systems  Constitutional:  Negative for fatigue and unexpected weight change.       Wears seat belt  HENT:  Negative for hearing loss, tinnitus and trouble swallowing.        Full dentures  Eyes:  Negative for visual disturbance.       No diplopia or unilateral vision loss  Respiratory:  Negative for cough, chest tightness, shortness of breath and wheezing.   Cardiovascular:  Negative for chest pain, palpitations and leg swelling.  Gastrointestinal:  Negative for blood in stool and constipation.       No heartburn on pantoprazole  (uses prn)  Endocrine: Negative for polydipsia and polyuria.  Genitourinary:  Negative for difficulty urinating and urgency.       Nocturia x 2 No sexual problems  Musculoskeletal:  Negative for arthralgias, back pain and joint swelling.  Skin:  Negative for rash.       No suspicious lesions  Allergic/Immunologic: Positive for environmental allergies. Negative for immunocompromised state.       Mild symptoms---no meds  Neurological:  Negative for dizziness, syncope, light-headedness and headaches.  Hematological:  Negative for adenopathy. Does not bruise/bleed easily.  Psychiatric/Behavioral:  Negative for dysphoric mood and sleep disturbance. The patient is not nervous/anxious.        Objective:   Physical Exam Constitutional:      Appearance: Normal appearance.  HENT:     Mouth/Throat:     Pharynx: No oropharyngeal exudate or posterior oropharyngeal erythema.  Eyes:     Conjunctiva/sclera: Conjunctivae normal.     Pupils: Pupils are equal, round, and reactive to light.  Cardiovascular:     Rate and Rhythm: Regular rhythm. Bradycardia present.     Heart sounds: No murmur heard.    No gallop.     Comments:  Very faint pedal pulses Pulmonary:     Effort: Pulmonary effort is normal.     Breath sounds: No wheezing or rales.  Comments: Decreased breath sounds but clear Abdominal:     Palpations: Abdomen is soft.     Tenderness: There is no abdominal tenderness.  Musculoskeletal:     Cervical back: Neck supple.     Right lower leg: No edema.     Left lower leg: No edema.  Lymphadenopathy:     Cervical: No cervical adenopathy.  Skin:    Findings: No lesion or rash.  Neurological:     General: No focal deficit present.     Mental Status: He is alert and oriented to person, place, and time.  Psychiatric:        Mood and Affect: Mood normal.        Behavior: Behavior normal.            Assessment & Plan:

## 2023-09-25 NOTE — Assessment & Plan Note (Signed)
 No claudication since stent On lipitor  80, plavix , ASA

## 2023-09-25 NOTE — Assessment & Plan Note (Signed)
 Doing okay Needs to reschedule the lung cancer screening Cologuard again in 3 years Done with PSA testing Discussed exercise Td, shingrix at pharmacy Flu/COVID vaccines in the fall

## 2023-09-25 NOTE — Assessment & Plan Note (Signed)
 Only occ cigar now Needs lung cancer screening

## 2023-10-02 ENCOUNTER — Ambulatory Visit
Admission: RE | Admit: 2023-10-02 | Discharge: 2023-10-02 | Disposition: A | Discharge status: Home / Self Care | Source: Ambulatory Visit | Attending: Acute Care | Admitting: Acute Care

## 2023-10-02 DIAGNOSIS — Z122 Encounter for screening for malignant neoplasm of respiratory organs: Secondary | ICD-10-CM | POA: Diagnosis not present

## 2023-10-02 DIAGNOSIS — Z87891 Personal history of nicotine dependence: Secondary | ICD-10-CM | POA: Insufficient documentation

## 2023-10-13 ENCOUNTER — Other Ambulatory Visit: Payer: Self-pay

## 2023-10-13 DIAGNOSIS — Z87891 Personal history of nicotine dependence: Secondary | ICD-10-CM

## 2023-10-13 DIAGNOSIS — Z122 Encounter for screening for malignant neoplasm of respiratory organs: Secondary | ICD-10-CM

## 2023-10-14 ENCOUNTER — Other Ambulatory Visit: Payer: Self-pay

## 2023-11-11 ENCOUNTER — Telehealth: Payer: Self-pay

## 2023-11-11 NOTE — Telephone Encounter (Addendum)
 Lvm asking pt to call back. Need to notify pt we have the next dates for Medicare Immunization Clinic:  12/23/23 or 02/03/24 (9:00a- 4:00p) to see if he wants to get all or any of the following vaccines: 1st shingles shot, Tdap, flu. If yes, which vaccines, date and time?

## 2023-11-13 NOTE — Telephone Encounter (Signed)
 Spoke with pt scheduling Tdap and flu on 02/03/24 at 9:15.

## 2023-11-28 ENCOUNTER — Other Ambulatory Visit: Payer: Self-pay | Admitting: *Deleted

## 2023-11-28 MED ORDER — CLONAZEPAM 0.5 MG PO TABS: ORAL_TABLET | ORAL | 1 refills | Status: DC

## 2023-11-28 NOTE — Telephone Encounter (Signed)
 Last filled: 09/05/23 #30/ 0 refills  Last OV: CPE 09/25/23 Next OV: TOC/CPE on 10/10/24 [facility]

## 2023-12-02 ENCOUNTER — Ambulatory Visit: Attending: Medical | Admitting: Medical

## 2023-12-02 ENCOUNTER — Ambulatory Visit (INDEPENDENT_AMBULATORY_CARE_PROVIDER_SITE_OTHER)

## 2023-12-02 ENCOUNTER — Encounter: Payer: Self-pay | Admitting: Medical

## 2023-12-02 VITALS — BP 118/66 | HR 57 | Ht 69.0 in | Wt 176.4 lb

## 2023-12-02 DIAGNOSIS — I2510 Atherosclerotic heart disease of native coronary artery without angina pectoris: Secondary | ICD-10-CM

## 2023-12-02 DIAGNOSIS — I455 Other specified heart block: Secondary | ICD-10-CM

## 2023-12-02 DIAGNOSIS — J449 Chronic obstructive pulmonary disease, unspecified: Secondary | ICD-10-CM

## 2023-12-02 DIAGNOSIS — R5383 Other fatigue: Secondary | ICD-10-CM | POA: Diagnosis not present

## 2023-12-02 DIAGNOSIS — G4733 Obstructive sleep apnea (adult) (pediatric): Secondary | ICD-10-CM | POA: Diagnosis not present

## 2023-12-02 DIAGNOSIS — I480 Paroxysmal atrial fibrillation: Secondary | ICD-10-CM | POA: Diagnosis not present

## 2023-12-02 NOTE — Patient Instructions (Addendum)
 Medication Instructions:  Your physician recommends the following medication changes.  STOP TAKING: Lisinopril     *If you need a refill on your cardiac medications before your next appointment, please call your pharmacy*  Lab Work: Your provider would like for you to have following labs drawn today CBC, BMP, TSH.     Testing/Procedures: GEOFFRY HEWS- Long Term Monitor Instructions  Your physician has requested you wear a ZIO patch monitor for 14 days.  This is a single patch monitor. [employer] supplies one patch monitor per enrollment. Additional stickers are not available. Please do not apply patch if you will be having a Nuclear Stress Test, Echocardiogram, Cardiac CT, MRI, or Chest Xray during the period you would be wearing the monitor. The patch cannot be worn during these tests. You cannot remove and re-apply the ZIO XT patch monitor.  Your ZIO patch monitor will be mailed 3 day USPS to your address on file. It may take 3-5 days to receive your monitor after you have been enrolled. Once you have received your monitor, please review the enclosed instructions. Your monitor has already been registered assigning a specific monitor serial number to you.  Billing and Patient Assistance Program Information  We have supplied [employer] with any of your insurance information on file for billing purposes.  [employer] offers a sliding scale Patient Assistance Program for patients that do not have insurance, or whose insurance does not completely cover the cost of the ZIO monitor.  You must apply for the Patient Assistance Program to qualify for this discounted rate.  To apply, please call [employer] at [phone], select option 4, select option 2, ask to apply for Patient Assistance Program. Meredeth will ask your household income, and how many people are in your household. They will quote your out-of-pocket cost based on that information. [employer] will also be able to set up a 65-month, interest-free payment plan  if needed.  Applying the monitor   Shave hair from upper left chest.  Hold abrader disc by orange tab. Rub abrader in 40 strokes over the upper left chest as indicated in your monitor instructions.  Clean area with 4 enclosed alcohol pads. Let dry.  Apply patch as indicated in monitor instructions. Patch will be placed under collarbone on left side of chest with arrow pointing upward.  Rub patch adhesive wings for 2 minutes. Remove white label marked 1. Remove the white label marked 2. Rub patch adhesive wings for 2 additional minutes.  While looking in a mirror, press and release button in center of patch. A small green light will flash 3-4 times. This will be your only indicator that the monitor has been turned on.  Do not shower for the first 24 hours. You may shower after the first 24 hours.  Press the button if you feel a symptom. You will hear a small click. Record Date, Time and Symptom in the Patient Logbook.  When you are ready to remove the patch, follow instructions on the last 2 pages of Patient Logbook.  Stick patch monitor into the tabs at the bottom of the return box.  Place Patient Logbook in the blue and white box. Use locking tab on box and tape box closed securely. The blue and white box has prepaid postage on it. Please place it in the mailbox as soon as possible. Your physician should have your test results approximately 7-14 days after the monitor has been mailed back to [facility].  Call [facility] Customer Care at [phone] if you  have questions regarding your ZIO XT patch monitor.  Call them immediately if you see an orange light blinking on your monitor.  If your monitor falls off in less than 4 days, contact our Monitor department at [phone].  If your monitor becomes loose or falls off after 4 days call [employer] at [phone] for suggestions on securing your monitor.   Follow-Up: At [facility], you and your health needs are our  priority.  As part of our continuing mission to provide you with exceptional heart care, our providers are all part of one team.  This team includes your primary Cardiologist (physician) and Advanced Practice Providers or APPs (Physician Assistants and Nurse Practitioners) who all work together to provide you with the care you need, when you need it.  Your next appointment:   2 month(s)  Provider:   Deatrice Cage, MD or Cadence Franchester, PA-C

## 2023-12-02 NOTE — Progress Notes (Signed)
 Cardiology Office Note   Date:  12/02/2023  ID:  Franklin Marks, DOB [birth year], MRN [id number] PCP: Jimmy Charlie FERNS, MD  [facility] Providers Cardiologist:  Deatrice Cage, MD    History of Present Illness Franklin Marks is a 73 y.o. male with a hx of CAD s/p remote CABG x5 (LIMA -LAD, SVG to D1, SVG to D2, sequential SVG to RPDA VA to RPDA in 2001 with multiple subsequent PCI's (DES to proximal SVG to D1 in 2016 in the setting of non-STEMI, DES to proximal SVG to RPDA Vito RPDA in 2022 and DES to native left main/left circumflex in 2022, ischemic cardiomyopathy, chronic HFrEF with EF of 35 to 40% in October 2013 with subsequent normalization to 55 to 60% on echo in April 2022, remote A-fib not on anticoagulation, bradycardia, PAD with chronic occlusion of bilateral SFAs and status post stenting of bilateral common iliac arteries and November 2023, COPD, OSA, GERD, and tobacco use who is being seen for follow-up.   Patient presented to the ER 12/22/2022 with dizziness and presyncope.  He was noted to be bradycardic.  Echo showed EF 55 to 60%, no wall motion abnormality, grade 2 diastolic dysfunction.  Heart monitor was ordered.  This showed predominantly normal sinus rhythm with first-degree AV block, IVCD, 1 pause lasting 3 seconds at 20 bpm, pause occurred due to possible atrial tachycardia with block, second-degree AV block, rare PACs, PVCs of 1.5% burden. He was referred to pulmonology for sleep study.  He was found to have sleep apnea but does not want to wear the mask.  Patient was last seen 05/20/2023 and was stable from a cardiac perspective.  Today, the patient reports general fatigue for the last two weeks. He dizziness when he changes positions. He feels unstable at times. Orthostatic symptoms seem to be occurring more often. He denies chest pain or SOB. He does not he may have had a cold 2 weeks ago. He was told he has sleep apnea, but does not want to use a mask.   Studies  Reviewed EKG Interpretation Date/Time:  Tuesday December 02 2023 10:35:58 EDT Ventricular Rate:  57 PR Interval:    QRS Duration:  134 QT Interval:  458 QTC Calculation: 445 R Axis:   35  Text Interpretation: Sinus bradycardia First degree A-V block Right bundle branch block Possible Inferior infarct , age undetermined When compared with ECG of 20-May-2023 14:05, Atrial fibrillation has replaced Sinus rhythm Confirmed by Franchester, Adalyne Lovick ([id number]) on 12/02/2023 10:45:27 AM    Heart monitor 01/2023 Text Interpretation:Sinus rhythm with 1st degree A-V block Right bundle branch block When compared with ECG of 12-Feb-2023 13:48, No significant change was found Confirmed by Franchester, Hartman Minahan ([id number]) on 05/20/2023 2:07:32 PM   Heart monitor 01/2023 Patch Wear Time:  6 days and 17 hours (2024-10-15T19:45:52-0400 to 2024-10-22T13:06:09-398)   Patient had a min HR of 22 bpm, max HR of 136 bpm, and avg HR of 65 bpm. Predominant underlying rhythm was Sinus Rhythm. First Degree AV Block was present. Bundle Branch Block/IVCD was present. 1 Pause occurred lasting 3 secs (20 bpm). Pause occurred due to possible Atrial Tachycardia with block. Second Degree AV Block-Mobitz I (Wenckebach) was present.  Rare PACs. Occasional PVCs with a burden of 1.5%.   Echo 12/2022  1. Left ventricular ejection fraction, by estimation, is 55 to 60%. The  left ventricle has normal function. The left ventricle has no regional  wall motion abnormalities. There is mild concentric left ventricular  hypertrophy. Left ventricular diastolic  parameters are consistent with Grade II diastolic dysfunction  (pseudonormalization).   2. Right ventricular systolic function is normal. The right ventricular  size is normal. There is normal pulmonary artery systolic pressure.   3. Left atrial size was mildly dilated.   4. Right atrial size was mildly dilated.   5. The mitral valve is normal in structure. Trivial mitral valve  regurgitation.  No evidence of mitral stenosis.   6. The aortic valve is tricuspid. There is mild calcification of the  aortic valve. There is mild thickening of the aortic valve. Aortic valve  regurgitation is trivial. Aortic valve sclerosis/calcification is present,  without any evidence of aortic  stenosis. Aortic valve area, by VTI measures 2.32 cm. Aortic valve mean  gradient measures 4.0 mmHg. Aortic valve Vmax measures 1.54 m/s.   7. The inferior vena cava is normal in size with greater than 50%  respiratory variability, suggesting right atrial pressure of 3 mmHg.    LHC 06/2020  Ost LAD lesion is 100% stenosed. LIMA to LAD is patent. Dist LAD-1 lesion is 70% stenosed. SVG to first diagonal is occluded. Mid Cx lesion is 30% stenosed. Ost RCA lesion is 100% stenosed. SVG to PDA is patent with 80% proximal stenosis. A drug-eluting stent was successfully placed using a SYNERGY XD 4.50X12, postdilated to > 5 mm and optimized with IVUS. Post intervention, there is a 0% residual stenosis. RPAV lesion is occluded with left to right collaterals. Patent stent in SVG to second diagonal. Mid LM to Ost LAD lesion is 75% stenosed. This has progressed compared to the prior cath. The left ventricular ejection fraction is 45-50% by visual estimate. There is mild left ventricular systolic dysfunction. Anterolateral hypokinesis. LV end diastolic pressure is normal. There is no aortic valve stenosis. Origin to Prox Graft lesion before RPAV is 80% stenosed. And is normal in caliber. The graft exhibits mild .   Continue aggressive secondary prevention.  Consider PCI of left main into circumflex tomorrow.  Could use right radial approach.     Echo 06/2020  1. Left ventricular ejection fraction, by estimation, is 55 to 60%. The  left ventricle has normal function. The left ventricle has no regional  wall motion abnormalities. Left ventricular diastolic parameters were  normal.   2. Right ventricular systolic  function is normal. The right ventricular  size is normal. Tricuspid regurgitation signal is inadequate for assessing  PA pressure.   3. The mitral valve is normal in structure. Trivial mitral valve  regurgitation. No evidence of mitral stenosis.   4. The aortic valve is tricuspid. Aortic valve regurgitation is not  visualized. Mild aortic valve sclerosis is present, with no evidence of  aortic valve stenosis.   5. The inferior vena cava is normal in size with <50% respiratory  variability, suggesting right atrial pressure of 8 mmHg.         Physical Exam VS:  BP 118/66   Pulse (!) 57   Ht 5' 9 (1.753 m)   Wt 176 lb 6.4 oz (80 kg)   SpO2 96%   BMI 26.05 kg/m        Wt Readings from Last 3 Encounters:  12/02/23 176 lb 6.4 oz (80 kg)  09/25/23 175 lb (79.4 kg)  09/05/23 176 lb 3.2 oz (79.9 kg)    GEN: Well nourished, well developed in no acute distress NECK: No JVD; No carotid bruits CARDIAC: RRR, no murmurs, rubs, gallops RESPIRATORY:  Clear to auscultation  without rales, wheezing or rhonchi  ABDOMEN: Soft, non-tender, non-distended EXTREMITIES:  No edema; No deformity   ASSESSMENT AND PLAN:  Generalized fatigue H/o presyncope and remote Afib Patient reports 2 weeks of feeling very tired.  He feels some minor orthostatic symptoms as well and reports he have had a URI 2 weeks ago.  He has had prior workup for presyncope last year found to have bradycardia and hypotension.  Echo showed normal EF.  Heart monitor showed normal sinus rhythm, first-degree AV block and 3-second pause at night, possible atrial tachycardia, second-degree AV block type I, rare ectopy.  He has not been on a beta-blocker.  He was referred to pulmonology for sleep apnea testing.  He was found to have sleep apnea but does not want to wear the mask.  Patient does have a history of remote A-fib.  EKG shows sinus bradycardia, first-degree AV block with a pause 1.6 seconds, right bundle branch block I will check  a CBC, TSH, BMP.  I will check a 2-week heart monitor.  I do suspect untreated sleep apnea is contributing to general fatigue.  I will stop lisinopril  due to orthostatic symptoms.  I recommended good hydration.  CAD s/p CABG and multiple stents Patient denies chest pain or shortness of breath.  Most recent echo showed normalized EF.  Hold lisinopril  for orthostatic symptoms as above.  Nocturnal pauses OSA COPD Patient is not wanting to wear CPAP machine.  Dispo: Follow-up in 2 months  Signed, Johannes Everage VEAR Fishman, PA-C

## 2023-12-03 ENCOUNTER — Ambulatory Visit: Payer: Self-pay | Admitting: Medical

## 2023-12-03 DIAGNOSIS — I441 Atrioventricular block, second degree: Secondary | ICD-10-CM

## 2023-12-03 DIAGNOSIS — G4733 Obstructive sleep apnea (adult) (pediatric): Secondary | ICD-10-CM

## 2023-12-03 LAB — BASIC METABOLIC PANEL WITH GFR
BUN/Creatinine Ratio: 17 (ref 10–24)
BUN: 16 mg/dL (ref 8–27)
CO2: 21 mmol/L (ref 20–29)
Calcium: 9.3 mg/dL (ref 8.6–10.2)
Chloride: 105 mmol/L (ref 96–106)
Creatinine, Ser: 0.94 mg/dL (ref 0.76–1.27)
Glucose: 97 mg/dL (ref 70–99)
Potassium: 4.6 mmol/L (ref 3.5–5.2)
Sodium: 141 mmol/L (ref 134–144)
eGFR: 86 mL/min/1.73 (ref >59)

## 2023-12-03 LAB — CBC
Hematocrit: 39.6 % (ref 37.5–51.0)
Hemoglobin: 13.5 g/dL (ref 13.0–17.7)
MCH: 31.3 pg (ref 26.6–33.0)
MCHC: 34.1 g/dL (ref 31.5–35.7)
MCV: 92 fL (ref 79–97)
Platelets: 186 x10E3/uL (ref 150–450)
RBC: 4.31 x10E6/uL (ref 4.14–5.80)
RDW: 13.1 % (ref 11.6–15.4)
WBC: 6.5 x10E3/uL (ref 3.4–10.8)

## 2023-12-03 LAB — TSH: TSH: 1.66 u[IU]/mL (ref 0.450–4.500)

## 2023-12-25 DIAGNOSIS — R5383 Other fatigue: Secondary | ICD-10-CM | POA: Diagnosis not present

## 2023-12-25 DIAGNOSIS — I480 Paroxysmal atrial fibrillation: Secondary | ICD-10-CM | POA: Diagnosis not present

## 2024-01-17 DIAGNOSIS — R5383 Other fatigue: Secondary | ICD-10-CM | POA: Diagnosis not present

## 2024-01-17 DIAGNOSIS — I480 Paroxysmal atrial fibrillation: Secondary | ICD-10-CM | POA: Diagnosis not present

## 2024-01-20 ENCOUNTER — Ambulatory Visit: Payer: Self-pay

## 2024-01-20 NOTE — Telephone Encounter (Signed)
 Patient returned RN's call regarding results.

## 2024-01-20 NOTE — Telephone Encounter (Signed)
 FYI Only or Action Required?: Action required by provider: request for appointment.  Patient was last seen in primary care on 09/25/2023 by Franklin Charlie FERNS, MD.  Called Nurse Triage reporting Cough.  Symptoms began yesterday.  Interventions attempted: Rest, hydration, or home remedies.  Symptoms are: gradually worsening.Non-productive cough, runny nose. Feels like he has the flu. No availability today.  Triage Disposition: See Physician Within 24 Hours  Patient/caregiver understands and will follow disposition?: Yes   Copied from CRM #[id number]. Topic: Clinical - Red Word Triage >> Jan 20, 2024  9:37 AM China J wrote: [facility] that prompted transfer to Nurse Triage: The patient feels like he has the flu. He is experiencing lung pain, head congestion, coughing, and sneezing. Answer Assessment - Initial Assessment Questions 1. ONSET: When did the cough begin?      2 days 2. SEVERITY: How bad is the cough today?      severe 3. SPUTUM: Describe the color of your sputum (e.g., none, dry cough; clear, white, yellow, green)     no 4. HEMOPTYSIS: Are you coughing up any blood? If Yes, ask: How much? (e.g., flecks, streaks, tablespoons, etc.)     no 5. DIFFICULTY BREATHING: Are you having difficulty breathing? If Yes, ask: How bad is it? (e.g., mild, moderate, severe)      yes 6. FEVER: Do you have a fever? If Yes, ask: What is your temperature, how was it measured, and when did it start?     Low grade 7. CARDIAC HISTORY: Do you have any history of heart disease? (e.g., heart attack, congestive heart failure)      yes 8. LUNG HISTORY: Do you have any history of lung disease?  (e.g., pulmonary embolus, asthma, emphysema)     no 9. PE RISK FACTORS: Do you have a history of blood clots? (or: recent major surgery, recent prolonged travel, bedridden)     no 10. OTHER SYMPTOMS: Do you have any other symptoms? (e.g., runny nose, wheezing, chest pain)       Runny  nose 11. PREGNANCY: Is there any chance you are pregnant? When was your last menstrual period?       N/a 12. TRAVEL: Have you traveled out of the country in the last month? (e.g., travel history, exposures)       no  Protocols used: Cough - Acute Non-Productive-A-AH  Reason for Disposition  [1] MILD difficulty breathing (e.g., minimal/no SOB at rest, SOB with walking, pulse < 100) AND [2] still present when not coughing  Answer Assessment - Initial Assessment Questions 1. ONSET: When did the cough begin?      2 days 2. SEVERITY: How bad is the cough today?      severe 3. SPUTUM: Describe the color of your sputum (e.g., none, dry cough; clear, white, yellow, green)     no 4. HEMOPTYSIS: Are you coughing up any blood? If Yes, ask: How much? (e.g., flecks, streaks, tablespoons, etc.)     no 5. DIFFICULTY BREATHING: Are you having difficulty breathing? If Yes, ask: How bad is it? (e.g., mild, moderate, severe)      yes 6. FEVER: Do you have a fever? If Yes, ask: What is your temperature, how was it measured, and when did it start?     Low grade 7. CARDIAC HISTORY: Do you have any history of heart disease? (e.g., heart attack, congestive heart failure)      yes 8. LUNG HISTORY: Do you have any history of lung disease?  (  e.g., pulmonary embolus, asthma, emphysema)     no 9. PE RISK FACTORS: Do you have a history of blood clots? (or: recent major surgery, recent prolonged travel, bedridden)     no 10. OTHER SYMPTOMS: Do you have any other symptoms? (e.g., runny nose, wheezing, chest pain)       Runny nose 11. PREGNANCY: Is there any chance you are pregnant? When was your last menstrual period?       N/a 12. TRAVEL: Have you traveled out of the country in the last month? (e.g., travel history, exposures)       no  Protocols used: Cough - Acute Non-Productive-A-AH  Answer Assessment - Initial Assessment Questions 1. ONSET: When did the cough  begin?      2 days 2. SEVERITY: How bad is the cough today?      severe 3. SPUTUM: Describe the color of your sputum (e.g., none, dry cough; clear, white, yellow, green)     no 4. HEMOPTYSIS: Are you coughing up any blood? If Yes, ask: How much? (e.g., flecks, streaks, tablespoons, etc.)     no 5. DIFFICULTY BREATHING: Are you having difficulty breathing? If Yes, ask: How bad is it? (e.g., mild, moderate, severe)      yes 6. FEVER: Do you have a fever? If Yes, ask: What is your temperature, how was it measured, and when did it start?     Low grade 7. CARDIAC HISTORY: Do you have any history of heart disease? (e.g., heart attack, congestive heart failure)      yes 8. LUNG HISTORY: Do you have any history of lung disease?  (e.g., pulmonary embolus, asthma, emphysema)     no 9. PE RISK FACTORS: Do you have a history of blood clots? (or: recent major surgery, recent prolonged travel, bedridden)     no 10. OTHER SYMPTOMS: Do you have any other symptoms? (e.g., runny nose, wheezing, chest pain)       Runny nose 11. PREGNANCY: Is there any chance you are pregnant? When was your last menstrual period?       N/a 12. TRAVEL: Have you traveled out of the country in the last month? (e.g., travel history, exposures)       no  Protocols used: Cough - Acute Non-Productive-A-AH  Reason for Disposition  [1] Continuous (nonstop) coughing interferes with work or school AND [2] no improvement using cough treatment per Care Advice  Answer Assessment - Initial Assessment Questions 1. ONSET: When did the cough begin?      2 days 2. SEVERITY: How bad is the cough today?      severe 3. SPUTUM: Describe the color of your sputum (e.g., none, dry cough; clear, white, yellow, green)     no 4. HEMOPTYSIS: Are you coughing up any blood? If Yes, ask: How much? (e.g., flecks, streaks, tablespoons, etc.)     no 5. DIFFICULTY BREATHING: Are you having difficulty  breathing? If Yes, ask: How bad is it? (e.g., mild, moderate, severe)      yes 6. FEVER: Do you have a fever? If Yes, ask: What is your temperature, how was it measured, and when did it start?     Low grade 7. CARDIAC HISTORY: Do you have any history of heart disease? (e.g., heart attack, congestive heart failure)      yes 8. LUNG HISTORY: Do you have any history of lung disease?  (e.g., pulmonary embolus, asthma, emphysema)     no 9. PE RISK FACTORS: Do you  have a history of blood clots? (or: recent major surgery, recent prolonged travel, bedridden)     no 10. OTHER SYMPTOMS: Do you have any other symptoms? (e.g., runny nose, wheezing, chest pain)       Runny nose 11. PREGNANCY: Is there any chance you are pregnant? When was your last menstrual period?       N/a 12. TRAVEL: Have you traveled out of the country in the last month? (e.g., travel history, exposures)       no  Protocols used: Cough - Acute Non-Productive-A-AH

## 2024-01-21 ENCOUNTER — Ambulatory Visit: Payer: Self-pay | Admitting: General Practice

## 2024-01-21 ENCOUNTER — Encounter: Payer: Self-pay | Admitting: General Practice

## 2024-01-21 ENCOUNTER — Ambulatory Visit (INDEPENDENT_AMBULATORY_CARE_PROVIDER_SITE_OTHER): Admitting: General Practice

## 2024-01-21 VITALS — BP 120/80 | HR 62 | Temp 98.3°F | Ht 69.0 in | Wt 177.0 lb

## 2024-01-21 DIAGNOSIS — R051 Acute cough: Secondary | ICD-10-CM | POA: Diagnosis not present

## 2024-01-21 DIAGNOSIS — J069 Acute upper respiratory infection, unspecified: Secondary | ICD-10-CM | POA: Diagnosis not present

## 2024-01-21 LAB — POCT INFLUENZA A/B
Influenza A, POC: NEGATIVE
Influenza B, POC: NEGATIVE

## 2024-01-21 LAB — POC COVID19 BINAXNOW: SARS Coronavirus 2 Ag: NEGATIVE

## 2024-01-21 MED ORDER — FLUTICASONE PROPIONATE 50 MCG/ACT NA SUSP: 2.0000 | Freq: Every day | NASAL | 0 refills | Status: DC

## 2024-01-21 MED ORDER — BENZONATATE 200 MG PO CAPS: 200.0000 mg | ORAL_CAPSULE | Freq: Three times a day (TID) | ORAL | 0 refills | Status: DC | PRN

## 2024-01-21 NOTE — Progress Notes (Signed)
 Established Patient Office Visit  Subjective   Patient ID: Franklin Marks, male    DOB: 17-Feb-1951  Age: 73 y.o. MRN: [id number]  Chief Complaint  Patient presents with   Cough    With diarrhea, watery eyes, congestion, slight fever x 2 days. Patient has not been taking anything for sx. Patient has not done any at home tests.     Cough Associated symptoms include headaches and wheezing. Pertinent negatives include no chest pain, chills, ear pain, fever, heartburn, sore throat or shortness of breath.   Discussed the use of AI scribe software for clinical note transcription with the patient, who gave verbal consent to proceed.  History of Present Illness Franklin Marks is a 73 year old male, patient of Dr. Jimmy, with Mobitz type heart block who presents with respiratory symptoms and diarrhea.  He has been experiencing a cough, watery eyes, congestion, slight fever, and diarrhea for the past couple of days. He has not taken any over-the-counter medications or performed any home tests.   The fever was present until yesterday, though he did not measure the temperature. He notes severe fatigue and sinus pain, particularly in the frontal region, along with occasional dizziness. He has mucus production and wheezing, but no ear pain or sore throat. He also mentions postnasal drip.  Regarding gastrointestinal symptoms, he experienced approximately ten watery bowel movements yesterday, which he describes as the worst day. Today, the diarrhea is improving, occurring only occasionally. No urinary symptoms.  He smokes cigars occasionally but does not smoke cigarettes daily. He expresses concern about his heart condition.      Patient Active Problem List   Diagnosis Date Noted   Syncope and collapse 12/22/2022   Mobitz (type) I [facility]) atrioventricular block 07/26/2020   GERD (gastroesophageal reflux disease) 07/22/2018   Advance directive discussed with patient 07/14/2017    Osteoarthrosis involving multiple sites 06/20/2015   Stented coronary artery    Hyperlipidemia 04/11/2014   Routine general medical examination at a health care facility 06/14/2013   Restless legs syndrome 09/10/2012   PAD (peripheral artery disease) 04/28/2012   Sinus bradycardia 02/27/2012   Atherosclerosis of native coronary artery with angina pectoris 01/10/2012   COPD (chronic obstructive pulmonary disease) (HCC) 01/10/2012   Ischemic cardiomyopathy 01/10/2012   Past Medical History:  Diagnosis Date   Anemia    Atrial fibrillation (HCC) 01/13/2012   CAD (coronary artery disease)    a. 2001 s/p CABGx5 (LIMA->LAD, VG->D1, VG->D2, VG->RPAV->RPDA; b. 02/2015 NSTEMI/PCI: VG->D2 95 (3.6x16 Promus DES). VG->D1 100; c. 06/2020 PCI: LM 75, LAD 100ost, D1 70, D2 70, LCX 49m, RCA 100ost, RPAV 100, VG->RPAV->RPDA 80ost (4.5x12 Synergy DES), VG->D1 100, VG->D2 ok, LIMA->LAD ok; d. 07/2020 PCI LM/LCX (2.5x15 Resolute Onyx DES).   COPD (chronic obstructive pulmonary disease) (HCC)    ongoing tobacco use   GERD (gastroesophageal reflux disease)    HFimpEF (heart failure with improved ejection fraction) (HCC)    a. 12/2011 Echo: EF 35-40%; b. 06/2020 Echo: EF 55-60%   Ischemic cardiomyopathy 01/10/2012   a. 12/2011 Echo: EF 35-40%, mild conc LVH,  sev HK of anteroseptum, borderline RVH; b. 06/2020 Echo: EF 55-60%, no rwma, triv MR. Mild Ao sclerosis.   NSTEMI (non-ST elevated myocardial infarction) (HCC) 01/10/2012   Associated with ventricular fibrillation   OSA (obstructive sleep apnea)    a. 07/2020 WatchPat1: 1. Moderate Obstructive Sleep Apnea with AHI 16.5/hr. O2 sat down to 80%.   Past Surgical History:  Procedure Laterality Date  ABDOMINAL AORTOGRAM W/LOWER EXTREMITY N/A 02/13/2022   Procedure: ABDOMINAL AORTOGRAM W/LOWER EXTREMITY;  Surgeon: Lanis Fonda BRAVO, MD;  Location: [facility] INVASIVE CV LAB;  Service: Cardiovascular;  Laterality: N/A;   CARDIAC CATHETERIZATION  2011   At [facility]   CARDIAC  CATHETERIZATION  12/2011   prox LAD occlusion, ostial RCA occlusion, 30% prox LCx stenosis, LIMA-LAD: 80% post-anastamosis lesion, SVG-Dx2: old occlusion w/ thrombus (noted on prior 2011 cath at [facility]), SVG-Dx: patent, SVG-RCA: patent, diffuse irregs, 80-90% PDA/PLA lesions patent   CARDIAC CATHETERIZATION N/A 03/15/2015   Procedure: Left Heart Cath and Cors/Grafts Angiography;  Surgeon: Lonni JONETTA Cash, MD;  Location: [facility] INVASIVE CV LAB;  Service: Cardiovascular;  Laterality: N/A;   CARDIAC CATHETERIZATION N/A 03/15/2015   Procedure: Coronary Stent Intervention;  Surgeon: Lonni JONETTA Cash, MD;  Location: [facility] INVASIVE CV LAB;  Service: Cardiovascular;  Laterality: N/A;  svg to diagnoal 2   CORONARY ARTERY BYPASS GRAFT  2001   CORONARY IMAGING/OCT N/A 07/26/2020   Procedure: INTRAVASCULAR IMAGING/OCT;  Surgeon: Darron Deatrice LABOR, MD;  Location: [facility] INVASIVE CV LAB;  Service: Cardiovascular;  Laterality: N/A;   CORONARY STENT INTERVENTION N/A 07/10/2020   Procedure: CORONARY STENT INTERVENTION;  Surgeon: Dann Candyce RAMAN, MD;  Location: [facility] INVASIVE CV LAB;  Service: Cardiovascular;  Laterality: N/A;   CORONARY STENT INTERVENTION N/A 07/26/2020   Procedure: CORONARY STENT INTERVENTION;  Surgeon: Darron Deatrice LABOR, MD;  Location: [facility] INVASIVE CV LAB;  Service: Cardiovascular;  Laterality: N/A;   CORONARY ULTRASOUND/IVUS N/A 07/10/2020   Procedure: Intravascular Ultrasound/IVUS;  Surgeon: Dann Candyce RAMAN, MD;  Location: [facility] INVASIVE CV LAB;  Service: Cardiovascular;  Laterality: N/A;   LEFT HEART CATH AND CORS/GRAFTS ANGIOGRAPHY N/A 07/10/2020   Procedure: LEFT HEART CATH AND CORS/GRAFTS ANGIOGRAPHY;  Surgeon: Dann Candyce RAMAN, MD;  Location: [facility] INVASIVE CV LAB;  Service: Cardiovascular;  Laterality: N/A;   LEFT HEART CATHETERIZATION WITH CORONARY ANGIOGRAM N/A 01/10/2012   Procedure: LEFT HEART CATHETERIZATION WITH CORONARY ANGIOGRAM;  Surgeon: Debby LABOR Sor, MD;  Location: [facility] CATH  LAB;  Service: Cardiovascular;  Laterality: N/A;   Allergies  Allergen Reactions   Codeine Nausea And Vomiting         09/25/2023    8:33 AM 09/05/2023    8:28 AM 09/23/2022   12:17 PM  Depression screen PHQ 2/9  Decreased Interest 0 0 0  Down, Depressed, Hopeless 0 0 0  PHQ - 2 Score 0 0 0        No data to display            Review of Systems  Constitutional:  Positive for malaise/fatigue. Negative for chills and fever.  HENT:  Positive for congestion and sinus pain. Negative for ear pain and sore throat.   Respiratory:  Positive for cough, sputum production and wheezing. Negative for shortness of breath.   Cardiovascular:  Negative for chest pain.  Gastrointestinal:  Positive for diarrhea. Negative for abdominal pain, constipation, heartburn, nausea and vomiting.  Genitourinary:  Negative for dysuria, frequency and urgency.  Neurological:  Positive for headaches. Negative for dizziness.  Endo/Heme/Allergies:  Negative for polydipsia.  Psychiatric/Behavioral:  Negative for depression and suicidal ideas. The patient is not nervous/anxious.       Objective:     BP 120/80   Pulse 62   Temp 98.3 F (36.8 C) (Temporal)   Ht 5' 9 (1.753 m)   Wt 177 lb (80.3 kg)   SpO2 96%   BMI 26.14 kg/m  BP Readings from  Last 3 Encounters:  01/21/24 120/80  12/02/23 118/66  09/25/23 124/70   Wt Readings from Last 3 Encounters:  01/21/24 177 lb (80.3 kg)  12/02/23 176 lb 6.4 oz (80 kg)  09/25/23 175 lb (79.4 kg)      Physical Exam Vitals and nursing note reviewed.  Constitutional:      Appearance: Normal appearance.  HENT:     Right Ear: Tympanic membrane, ear canal and external ear normal.     Left Ear: Tympanic membrane, ear canal and external ear normal.     Nose: Congestion present.     Right Turbinates: Swollen.     Left Turbinates: Swollen.     Right Sinus: Maxillary sinus tenderness and frontal sinus tenderness present.     Left Sinus: Maxillary sinus  tenderness and frontal sinus tenderness present.     Mouth/Throat:     Pharynx: Oropharynx is clear. Posterior oropharyngeal erythema present.  Eyes:     Conjunctiva/sclera: Conjunctivae normal.  Cardiovascular:     Rate and Rhythm: Normal rate and regular rhythm.     Pulses: Normal pulses.     Heart sounds: Normal heart sounds.  Pulmonary:     Effort: Pulmonary effort is normal.     Breath sounds: Normal breath sounds.  Neurological:     Mental Status: He is alert and oriented to person, place, and time.  Psychiatric:        Mood and Affect: Mood normal.        Behavior: Behavior normal.        Thought Content: Thought content normal.        Judgment: Judgment normal.      Results for orders placed or performed in visit on 01/21/24  POCT Influenza A/B  Result Value Ref Range   Influenza A, POC Negative Negative   Influenza B, POC Negative Negative  POC COVID-19  Result Value Ref Range   SARS Coronavirus 2 Ag Negative Negative       The ASCVD Risk score (Arnett DK, et al., 2019) failed to calculate for the following reasons:   Risk score cannot be calculated because patient has a medical history suggesting prior/existing ASCVD    Assessment & Plan:  Viral URI with cough -     Fluticasone Propionate; Place 2 sprays into both nostrils daily.  Dispense: 16 g; Refill: 0  Acute cough -     POCT Influenza A/B -     POC COVID-19 BinaxNow -     Benzonatate; Take 1 capsule (200 mg total) by mouth 3 (three) times daily as needed.  Dispense: 20 capsule; Refill: 0    Assessment and Plan Assessment & Plan Acute upper respiratory infection with acute cough and viral diarrhea Symptoms improving, indicating viral etiology. No bacterial infection suspected. - flu and covid negative. - Advised rest and increased fluid intake. - Recommended humidifier use during sleep. - Prescribed Tessalon Perles for cough, up to three times daily. - Recommended Flonase for sneezing and  congestion. - Advised Tylenol  for fever. - Recommended Pepto Bismol if diarrhea recurs. - Instructed to update by early next week if no improvement.   Return if symptoms worsen or fail to improve.    Carrol Aurora, NP

## 2024-01-21 NOTE — Patient Instructions (Addendum)
 You can try a few things over the counter to help with your symptoms including:  Cough: Delsym or Robitussin (get the off brand, works just as well) Chest Congestion: Mucinex  (plain) Nasal Congestion/Ear Pressure/Sinus Pressure: Try using Flonase (fluticasone) nasal spray. Instill 1 spray in each nostril twice daily. This can be purchased over the counter. Body aches, fevers, headache: Ibuprofen (not to exceed 2400 mg in 24 hours) or Acetaminophen -Tylenol  (not to exceed 3000 mg in 24 hours) Runny Nose/Throat Drainage/Sneezing/Itchy or Watery Eyes: An antihistamine such as Zyrtec, Claritin, Xyzal, Allegra  You should be feeling better by day seven of symptoms, but please do schedule an appointment if this is not the case.  Rest, increase fluid intake and use humidifier.   If the diarrhea comes back, please pepto-bismol.   It was a pleasure meeting you!

## 2024-01-29 NOTE — Progress Notes (Unsigned)
 Cardiology Office Note   Date:  01/30/2024  ID:  Franklin Marks, DOB 11-22-1950, MRN [id number] PCP: Jimmy Charlie FERNS, MD  [facility] Providers Cardiologist:  Deatrice Cage, MD  History of Present Illness Franklin Marks is a 73 y.o. male ith a hx of CAD s/p remote CABG x5 (LIMA -LAD, SVG to D1, SVG to D2, sequential SVG to RPDA VA to RPDA in 2001 with multiple subsequent PCI's (DES to proximal SVG to D1 in 2016 in the setting of non-STEMI, DES to proximal SVG to RPDA Vito RPDA in 2022 and DES to native left main/left circumflex in 2022, ischemic cardiomyopathy, chronic HFrEF with EF of 35 to 40% in October 2013 with subsequent normalization to 55 to 60% on echo in April 2022, remote A-fib not on anticoagulation, bradycardia, PAD with chronic occlusion of bilateral SFAs and status post stenting of bilateral common iliac arteries and November 2023, COPD, OSA, GERD, and tobacco use who is being seen for follow-up.   Patient presented to the ER 12/22/2022 with dizziness and presyncope.  He was noted to be bradycardic.  Echo showed EF 55 to 60%, no wall motion abnormality, grade 2 diastolic dysfunction.  Heart monitor was ordered.  This showed predominantly normal sinus rhythm with first-degree AV block, IVCD, 1 pause lasting 3 seconds at 20 bpm, pause occurred due to possible atrial tachycardia with block, second-degree AV block, rare PACs, PVCs of 1.5% burden. He was referred to pulmonology for sleep study.  He was found to have sleep apnea but does not want to wear the mask.  The patient was last seen 12/02/23 reporting generalized fatigue and dizziness when he changed positions.  Lisinopril  was stopped.    Today, the patient is feeling better after stopping lisinopril . He denies further dizziness or orthostatic symptoms. He feels energy is overall better, however he feels SOB when he exerts himself. He denies chest pain.   Studies Reviewed     Heart monitor 01/2023 Text  Interpretation:Sinus rhythm with 1st degree A-V block Right bundle branch block When compared with ECG of 12-Feb-2023 13:48, No significant change was found Confirmed by Franchester, Samaira Holzworth ([id number]) on 05/20/2023 2:07:32 PM   Heart monitor 01/2023 Patch Wear Time:  6 days and 17 hours (2024-10-15T19:45:52-0400 to 2024-10-22T13:06:09-398)   Patient had a min HR of 22 bpm, max HR of 136 bpm, and avg HR of 65 bpm. Predominant underlying rhythm was Sinus Rhythm. First Degree AV Block was present. Bundle Branch Block/IVCD was present. 1 Pause occurred lasting 3 secs (20 bpm). Pause occurred due to possible Atrial Tachycardia with block. Second Degree AV Block-Mobitz I (Wenckebach) was present.  Rare PACs. Occasional PVCs with a burden of 1.5%.   Echo 12/2022  1. Left ventricular ejection fraction, by estimation, is 55 to 60%. The  left ventricle has normal function. The left ventricle has no regional  wall motion abnormalities. There is mild concentric left ventricular  hypertrophy. Left ventricular diastolic  parameters are consistent with Grade II diastolic dysfunction  (pseudonormalization).   2. Right ventricular systolic function is normal. The right ventricular  size is normal. There is normal pulmonary artery systolic pressure.   3. Left atrial size was mildly dilated.   4. Right atrial size was mildly dilated.   5. The mitral valve is normal in structure. Trivial mitral valve  regurgitation. No evidence of mitral stenosis.   6. The aortic valve is tricuspid. There is mild calcification of the  aortic valve. There is mild thickening  of the aortic valve. Aortic valve  regurgitation is trivial. Aortic valve sclerosis/calcification is present,  without any evidence of aortic  stenosis. Aortic valve area, by VTI measures 2.32 cm. Aortic valve mean  gradient measures 4.0 mmHg. Aortic valve Vmax measures 1.54 m/s.   7. The inferior vena cava is normal in size with greater than 50%  respiratory  variability, suggesting right atrial pressure of 3 mmHg.    LHC 06/2020  Ost LAD lesion is 100% stenosed. LIMA to LAD is patent. Dist LAD-1 lesion is 70% stenosed. SVG to first diagonal is occluded. Mid Cx lesion is 30% stenosed. Ost RCA lesion is 100% stenosed. SVG to PDA is patent with 80% proximal stenosis. A drug-eluting stent was successfully placed using a SYNERGY XD 4.50X12, postdilated to > 5 mm and optimized with IVUS. Post intervention, there is a 0% residual stenosis. RPAV lesion is occluded with left to right collaterals. Patent stent in SVG to second diagonal. Mid LM to Ost LAD lesion is 75% stenosed. This has progressed compared to the prior cath. The left ventricular ejection fraction is 45-50% by visual estimate. There is mild left ventricular systolic dysfunction. Anterolateral hypokinesis. LV end diastolic pressure is normal. There is no aortic valve stenosis. Origin to Prox Graft lesion before RPAV is 80% stenosed. And is normal in caliber. The graft exhibits mild .   Continue aggressive secondary prevention.  Consider PCI of left main into circumflex tomorrow.  Could use right radial approach.     Echo 06/2020  1. Left ventricular ejection fraction, by estimation, is 55 to 60%. The  left ventricle has normal function. The left ventricle has no regional  wall motion abnormalities. Left ventricular diastolic parameters were  normal.   2. Right ventricular systolic function is normal. The right ventricular  size is normal. Tricuspid regurgitation signal is inadequate for assessing  PA pressure.   3. The mitral valve is normal in structure. Trivial mitral valve  regurgitation. No evidence of mitral stenosis.   4. The aortic valve is tricuspid. Aortic valve regurgitation is not  visualized. Mild aortic valve sclerosis is present, with no evidence of  aortic valve stenosis.   5. The inferior vena cava is normal in size with <50% respiratory  variability, suggesting  right atrial pressure of 8 mmHg.       Physical Exam VS:  BP 111/71   Pulse 69   Ht 5' 9 (1.753 m)   Wt 176 lb 12.8 oz (80.2 kg)   SpO2 96%   BMI 26.11 kg/m        Wt Readings from Last 3 Encounters:  01/30/24 176 lb 12.8 oz (80.2 kg)  01/21/24 177 lb (80.3 kg)  12/02/23 176 lb 6.4 oz (80 kg)    GEN: Well nourished, well developed in no acute distress NECK: No JVD; No carotid bruits CARDIAC: RRR, no murmurs, rubs, gallops RESPIRATORY:  Clear to auscultation without rales, wheezing or rhonchi  ABDOMEN: Soft, non-tender, non-distended EXTREMITIES:  No edema; No deformity   ASSESSMENT AND PLAN  DOE CAD s/p multiple stents Patient reports shortness of breath when he exerts himself or lifts objects.  He denies any chest pain.  Due to his significant CAD history I will check an echocardiogram and a cardiac PET stress test.  I recommend he continue to follow-up with pulmonology due to his COPD and OSA history.  Generalized fatigue H/o pre-syncope Patient feels fatigue has improved since stopping lisinopril .  He denies any orthostatic symptoms.  Update  echo as above.  Recent heart monitor showed normal sinus rhythm, first-degree AV block, 3.1 second pause at night, possible high grade AV block, second-degree AV block type I, rare ectopy, PVC 1.4% burden. He has not been on a beta-blocker.  He has been referred to pulmonology for sleep apnea and COPD management.  He does not wear his CPAP at night.  Patient also has a history of remote A-fib. He has been referred to EP.   Possible high grade AV block Nocturnal pauses This was seen on most recent monitor. He has been referred to EP as above.   OSA/COPD Recommend follow-up with pulmonology and CPAP use.    Informed Consent   Shared Decision Making/Informed Consent The risks [chest pain, shortness of breath, cardiac arrhythmias, dizziness, blood pressure fluctuations, myocardial infarction, stroke/transient ischemic attack, nausea,  vomiting, allergic reaction, radiation exposure, metallic taste sensation and life-threatening complications (estimated to be 1 in 10,000)], benefits (risk stratification, diagnosing coronary artery disease, treatment guidance) and alternatives of a cardiac PET stress test were discussed in detail with Mr. Schauer and he agrees to proceed.     Dispo: Follow-up in 3 months  Signed, Trayson Stitely VEAR Fishman, PA-C

## 2024-01-30 ENCOUNTER — Encounter: Payer: Self-pay | Admitting: Medical

## 2024-01-30 ENCOUNTER — Ambulatory Visit: Attending: Medical | Admitting: Medical

## 2024-01-30 VITALS — BP 111/71 | HR 69 | Ht 69.0 in | Wt 176.8 lb

## 2024-01-30 DIAGNOSIS — I2510 Atherosclerotic heart disease of native coronary artery without angina pectoris: Secondary | ICD-10-CM

## 2024-01-30 DIAGNOSIS — I455 Other specified heart block: Secondary | ICD-10-CM | POA: Diagnosis not present

## 2024-01-30 DIAGNOSIS — R5383 Other fatigue: Secondary | ICD-10-CM | POA: Diagnosis not present

## 2024-01-30 DIAGNOSIS — R55 Syncope and collapse: Secondary | ICD-10-CM

## 2024-01-30 DIAGNOSIS — J449 Chronic obstructive pulmonary disease, unspecified: Secondary | ICD-10-CM

## 2024-01-30 DIAGNOSIS — G4733 Obstructive sleep apnea (adult) (pediatric): Secondary | ICD-10-CM | POA: Diagnosis not present

## 2024-01-30 DIAGNOSIS — R0609 Other forms of dyspnea: Secondary | ICD-10-CM | POA: Diagnosis not present

## 2024-01-30 NOTE — Patient Instructions (Addendum)
 Medication Instructions:  Your physician recommends that you continue on your current medications as directed. Please refer to the Current Medication list given to you today.    *If you need a refill on your cardiac medications before your next appointment, please call your pharmacy*  Lab Work: No labs ordered today    Testing/Procedures: Your physician has requested that you have an echocardiogram. Echocardiography is a painless test that uses sound waves to create images of your heart. It provides your doctor with information about the size and shape of your heart and how well your heart's chambers and valves are working.   You may receive an ultrasound enhancing agent through an IV if needed to better visualize your heart during the echo. This procedure takes approximately one hour.  There are no restrictions for this procedure.  This will take place at [address] [facility] [address]) [address], [place] [address]  Please note: We ask at that you not bring children with you during ultrasound (echo/ vascular) testing. Due to room size and safety concerns, children are not allowed in the ultrasound rooms during exams. Our front office staff cannot provide observation of children in our lobby area while testing is being conducted. An adult accompanying a patient to their appointment will only be allowed in the ultrasound room at the discretion of the ultrasound technician under special circumstances. We apologize for any inconvenience.     Please report to Radiology at [facility] Main Entrance, medical mall, 30 mins prior to your test.  [address]  [place], [place]  How to Prepare for Your Cardiac PET/CT Stress Test:  Nothing to eat or drink, except water, 3 hours prior to arrival time.  NO caffeine/decaffeinated products, or chocolate 12 hours prior to arrival. (Please note decaffeinated beverages (teas/coffees) still contain caffeine).  If you have  caffeine within 12 hours prior, the test will need to be rescheduled.  Medication instructions: Do not take erectile dysfunction medications for 72 hours prior to test (sildenafil, tadalafil) Do not take nitrates (isosorbide  mononitrate, Ranexa) the day before or day of test Do not take tamsulosin the day before or morning of test Hold theophylline containing medications for 12 hours. Hold Dipyridamole 48 hours prior to the test.  Diabetic Preparation: If able to eat breakfast prior to 3 hour fasting, you may take all medications, including your insulin . Do not worry if you miss your breakfast dose of insulin  - start at your next meal. If you do not eat prior to 3 hour fast-Hold all diabetes (oral and insulin ) medications. Patients who wear a continuous glucose monitor MUST remove the device prior to scanning.  You may take your remaining medications with water.  NO perfume, cologne or lotion on chest or abdomen area. FEMALES - Please avoid wearing dresses to this appointment.  Total time is 1 to 2 hours; you may want to bring reading material for the waiting time.  IF YOU THINK YOU MAY BE PREGNANT, OR ARE NURSING PLEASE INFORM THE TECHNOLOGIST.  In preparation for your appointment, medication and supplies will be purchased.  Appointment availability is limited, so if you need to cancel or reschedule, please call the Radiology Department Scheduler at [phone] 24 hours in advance to avoid a cancellation fee of $100.00  What to Expect When you Arrive:  Once you arrive and check in for your appointment, you will be taken to a preparation room within the Radiology Department.  A technologist or Nurse will obtain  your medical history, verify that you are correctly prepped for the exam, and explain the procedure.  Afterwards, an IV will be started in your arm and electrodes will be placed on your skin for EKG monitoring during the stress portion of the exam. Then you will be escorted to the  PET/CT scanner.  There, staff will get you positioned on the scanner and obtain a blood pressure and EKG.  During the exam, you will continue to be connected to the EKG and blood pressure machines.  A small, safe amount of a radioactive tracer will be injected in your IV to obtain a series of pictures of your heart along with an injection of a stress agent.    After your Exam:  It is recommended that you eat a meal and drink a caffeinated beverage to counter act any effects of the stress agent.  Drink plenty of fluids for the remainder of the day and urinate frequently for the first couple of hours after the exam.  Your doctor will inform you of your test results within 7-10 business days.  For more information and frequently asked questions, please visit our website: [email or web address]  For questions about your test or how to prepare for your test, please call: Cardiac Imaging Nurse Navigators Office: [phone]  Follow-Up: At [facility], you and your health needs are our priority.  As part of our continuing mission to provide you with exceptional heart care, our providers are all part of one team.  This team includes your primary Cardiologist (physician) and Advanced Practice Providers or APPs (Physician Assistants and Nurse Practitioners) who all work together to provide you with the care you need, when you need it.  Your next appointment:   3 month(s)  Provider:   Mikey Fishman, PA-C

## 2024-02-02 ENCOUNTER — Telehealth: Payer: Self-pay

## 2024-02-02 ENCOUNTER — Ambulatory Visit: Admitting: Sleep Medicine

## 2024-02-02 ENCOUNTER — Other Ambulatory Visit: Payer: Self-pay

## 2024-02-02 MED ORDER — BOOSTRIX 5-2.5-18.5 LF-MCG/0.5 IM SUSY: PREFILLED_SYRINGE | INTRAMUSCULAR | 0 refills | Status: AC

## 2024-02-02 MED ORDER — FLUZONE HIGH-DOSE 0.5 ML IM SUSY: 0.5000 mL | PREFILLED_SYRINGE | Freq: Once | INTRAMUSCULAR | 0 refills | Status: AC

## 2024-02-02 NOTE — Telephone Encounter (Signed)
 Saw pt today and had to reschedule appointment due to not having a copy of a more up to date sleep study. Pt claims he has done a new sleep study a couple months ago or earlier this year but is unsure exactly when. I asked where he had it done and he said here implying [facility] but no records of a sleep study since June of 2022 is on file.   Asked pt to please provide us  with a copy of his most up to date sleep study, or we may have to request for another sleep study to be performed.

## 2024-02-03 ENCOUNTER — Other Ambulatory Visit: Payer: Self-pay

## 2024-02-04 ENCOUNTER — Other Ambulatory Visit: Payer: Self-pay | Admitting: General Practice

## 2024-02-04 DIAGNOSIS — R051 Acute cough: Secondary | ICD-10-CM

## 2024-02-05 NOTE — Telephone Encounter (Signed)
 Appt with Carrol, NP on 01/21/24 for an acute appt  Last filled on 01/21/24 #20 caps/ 0 refills

## 2024-02-12 ENCOUNTER — Other Ambulatory Visit: Payer: Self-pay | Admitting: General Practice

## 2024-02-12 DIAGNOSIS — J069 Acute upper respiratory infection, unspecified: Secondary | ICD-10-CM

## 2024-02-14 ENCOUNTER — Other Ambulatory Visit: Payer: Self-pay

## 2024-02-14 ENCOUNTER — Emergency Department (HOSPITAL_COMMUNITY)

## 2024-02-14 ENCOUNTER — Encounter (HOSPITAL_COMMUNITY): Payer: Self-pay | Admitting: *Deleted

## 2024-02-14 ENCOUNTER — Emergency Department (HOSPITAL_COMMUNITY)
Admission: EM | Admit: 2024-02-14 | Discharge: 2024-02-14 | Disposition: A | Discharge status: Home / Self Care | Attending: Emergency Medicine | Admitting: Emergency Medicine

## 2024-02-14 DIAGNOSIS — R42 Dizziness and giddiness: Secondary | ICD-10-CM | POA: Diagnosis not present

## 2024-02-14 DIAGNOSIS — R55 Syncope and collapse: Secondary | ICD-10-CM | POA: Diagnosis not present

## 2024-02-14 DIAGNOSIS — Z951 Presence of aortocoronary bypass graft: Secondary | ICD-10-CM | POA: Diagnosis not present

## 2024-02-14 DIAGNOSIS — Z7982 Long term (current) use of aspirin: Secondary | ICD-10-CM | POA: Diagnosis not present

## 2024-02-14 DIAGNOSIS — Z7901 Long term (current) use of anticoagulants: Secondary | ICD-10-CM | POA: Diagnosis not present

## 2024-02-14 LAB — CBC
HCT: 39.1 % (ref 39.0–52.0)
Hemoglobin: 12.7 g/dL — ABNORMAL LOW (ref 13.0–17.0)
MCH: 30.2 pg (ref 26.0–34.0)
MCHC: 32.5 g/dL (ref 30.0–36.0)
MCV: 92.9 fL (ref 80.0–100.0)
Platelets: 174 K/uL (ref 150–400)
RBC: 4.21 MIL/uL — ABNORMAL LOW (ref 4.22–5.81)
RDW: 13 % (ref 11.5–15.5)
WBC: 8.6 K/uL (ref 4.0–10.5)
nRBC: 0 % (ref 0.0–0.2)

## 2024-02-14 LAB — BASIC METABOLIC PANEL WITH GFR
Anion gap: 10 (ref 5–15)
BUN: 16 mg/dL (ref 8–23)
CO2: 24 mmol/L (ref 22–32)
Calcium: 8.6 mg/dL — ABNORMAL LOW (ref 8.9–10.3)
Chloride: 104 mmol/L (ref 98–111)
Creatinine, Ser: 0.85 mg/dL (ref 0.61–1.24)
GFR, Estimated: >60 mL/min (ref >60)
Glucose, Bld: 139 mg/dL — ABNORMAL HIGH (ref 70–99)
Potassium: 3.9 mmol/L (ref 3.5–5.1)
Sodium: 138 mmol/L (ref 135–145)

## 2024-02-14 LAB — TROPONIN I (HIGH SENSITIVITY)
Troponin I (High Sensitivity): 8 ng/L (ref <18)
Troponin I (High Sensitivity): 9 ng/L (ref <18)

## 2024-02-14 NOTE — Discharge Instructions (Signed)
 As we discussed, your labs, including 2 troponin tests, are very reassuring. Your symptoms are felt likely to your not having eaten prior to onset.   You can be discharged home and are encouraged to see your primary care doctor as needed. Maintain a normal/regular diet. Return to the ED as needed.

## 2024-02-14 NOTE — ED Triage Notes (Signed)
 Pt presents after an episode of sweating and disorientation x79mins while watching tv this afternoon.  Denies pain.  Pt is currently A&Ox4.

## 2024-02-14 NOTE — ED Notes (Signed)
 Pt d/c home per EDP order. Discharge summary reviewed, pt verbalizes understanding. NAD off unit via WC d/c home with visitor.

## 2024-02-14 NOTE — ED Notes (Signed)
 Pt ambulated to the room with a stable gait

## 2024-02-14 NOTE — ED Provider Notes (Signed)
 Franklin Marks   CSN: [id number] Arrival date & time: 02/14/24  1521     Patient presents with: Dizziness and Excessive Sweating   Franklin Marks is a 73 y.o. male.   Patient to ED for evaluation of a single episode of sudden onset weakness, diaphoresis, nausea, which lasted 15 minutes around 3:00 pm today. No chest pain, vomiting, SOB. He states that after the episode, he realized he had not eaten yet today and has had similar symptoms in the past in the same situation. He has been asymptomatic in the ED.  The history is provided by the patient. No language interpreter was used.  Dizziness      Prior to Admission medications   Medication Sig Start Date End Date Taking? Authorizing Provider  aspirin  EC 81 MG tablet Take 81 mg by mouth daily. Swallow whole.    [provider]  atorvastatin  (LIPITOR ) 80 MG tablet TAKE ONE (1) TABLET BY MOUTH EACH EVENING AT 6PM 09/08/23   Gerard Frederick, NP  benzonatate  (TESSALON ) 200 MG capsule TAKE 1 CAPSULE BY MOUTH THREE TIMES A DAY AS NEEDED 02/06/24   Vincente Shivers, NP  clonazePAM  (KLONOPIN ) 0.5 MG tablet TAKE 1/2 TO 1 TABLET BY MOUTH AT BEDTIME AS NEEDED FOR SLEEP/ANXIETY 11/28/23   Webb, Padonda B, FNP  clopidogrel  (PLAVIX ) 75 MG tablet TAKE 1 TABLET BY MOUTH EVERY DAY 09/09/23   Arida, Muhammad A, MD  Coenzyme Q10 (CO Q-10) 200 MG CAPS Take 200 capsules by mouth 1 day or 1 dose. Patient not taking: Reported on 01/30/2024    [provider]  Coenzyme Q10-Vitamin E (QUNOL ULTRA COQ10 PO) Take 1 tablet by mouth daily. Patient not taking: Reported on 01/30/2024 07/23/23   [provider]  fluticasone  (FLONASE ) 50 MCG/ACT nasal spray Place 2 sprays into both nostrils daily. Patient not taking: Reported on 01/30/2024 01/21/24   Vincente Shivers, NP  isosorbide  mononitrate (IMDUR ) 30 MG 24 hr tablet TAKE 1 TABLET BY MOUTH EVERY DAY 09/09/23   Darron Deatrice LABOR, MD  Misc  Natural Products (YUMVS BEET ROOT-TART CHERRY) 250-0.5 MG CHEW Chew 2 Containers by mouth 1 day or 1 dose.    [provider]  Omega-3 Fatty Acids (FISH OIL ) 1000 MG CAPS Take 1,000 mg by mouth in the morning and at bedtime.    [provider]  pantoprazole  (PROTONIX ) 20 MG tablet TAKE 1 TABLET BY MOUTH EVERY DAY AS NEEDED 09/05/23   Jimmy Ade I, MD  Tdap (BOOSTRIX ) 5-2.5-18.5 LF-MCG/0.5 injection Inject into the muscle. 02/02/24   Luiz Channel, MD    Allergies: Codeine    Review of Systems  Neurological:  Positive for dizziness.    Updated Vital Signs BP 123/67   Pulse (!) 56   Temp (!) 97.4 F (36.3 C)   Resp 18   Ht 5' 9 (1.753 m)   Wt 80.2 kg   SpO2 97%   BMI 26.11 kg/m   Physical Exam Vitals and nursing Marks reviewed.  Constitutional:      Appearance: Normal appearance. He is well-developed. He is not diaphoretic.  HENT:     Head: Normocephalic.  Neck:     Vascular: No carotid bruit.  Cardiovascular:     Rate and Rhythm: Normal rate and regular rhythm.     Heart sounds: No murmur heard. Pulmonary:     Effort: Pulmonary effort is normal.     Breath sounds: Normal breath sounds. No wheezing, rhonchi  or rales.  Abdominal:     General: Bowel sounds are normal.     Palpations: Abdomen is soft.     Tenderness: There is no abdominal tenderness. There is no guarding or rebound.  Musculoskeletal:        General: Normal range of motion.     Cervical back: Normal range of motion and neck supple.     Right lower leg: No edema.     Left lower leg: No edema.  Skin:    General: Skin is warm and dry.  Neurological:     General: No focal deficit present.     Mental Status: He is alert and oriented to person, place, and time.     (all labs ordered are listed, but only abnormal results are displayed) Labs Reviewed  BASIC METABOLIC PANEL WITH GFR - Abnormal; Notable for the following components:      Result Value   Glucose, Bld 139 (*)     Calcium  8.6 (*)    All other components within normal limits  CBC - Abnormal; Notable for the following components:   RBC 4.21 (*)    Hemoglobin 12.7 (*)    All other components within normal limits  TROPONIN I (HIGH SENSITIVITY)  TROPONIN I (HIGH SENSITIVITY)    EKG: EKG Interpretation Date/Time:  Saturday February 14 2024 15:30:36 EST Ventricular Rate:  60 PR Interval:    QRS Duration:  144 QT Interval:  464 QTC Calculation: 464 R Axis:   53  Text Interpretation: Sinus rhythm with 1st degree heart block, prolonged PR interval When compared with ECG of 02-Dec-2023 10:35, PREVIOUS ECG IS PRESENT No significant changes Confirmed by Cottie Cough [phone]) on 02/14/2024 4:22:02 PM  Radiology: DG Chest 2 View Result Date: 02/14/2024 CLINICAL DATA:  Syncope. EXAM: CHEST - 2 VIEW COMPARISON:  12/22/2022 FINDINGS: Stable heart size-post prior CABG. Stable appearance of probable underlying emphysematous lung disease. There is no evidence of pulmonary edema, consolidation, pneumothorax, nodule or pleural fluid. The visualized skeletal structures are unremarkable. IMPRESSION: No acute findings. Stable appearance of probable underlying emphysematous lung disease. Electronically Signed   By: Marcey Moan M.D.   On: 02/14/2024 16:45     Procedures   Medications Ordered in the ED - No data to display  Clinical Course as of 02/14/24 1827  Sat Feb 14, 2024  1749 Patient to ED for evaluation of 15-minute episode of diaphoresis, nausea, dizziness that resolved spontaneously. No oral intake today. No chest pain. Asymptomatic since. Labs reviewed and are reassuring. No WBC elevation, normal hgb, normal electrolytes and renal function. Troponin negative at 8, delta pending. CXR clear. EKG, per Dr. Cottie: EKG Interpretation Date/Time:  Saturday February 14 2024 15:30:36 EST Ventricular Rate:  60 PR Interval:    QRS Duration:  144 QT Interval:  464 QTC Calculation: 464 R Axis:   53  Text  Interpretation: Sinus rhythm with 1st degree heart block, prolonged PR interval When compared with ECG of 02-Dec-2023 10:35, PREVIOUS ECG IS PRESENT No significant changes Confirmed by Cottie Cough [phone]) on 02/14/2024 4:22:02 PM  He is given something to eat and drink. He has been seen and evaluated by Dr. Cottie. Will decide disposition based on delta troponin, but anticipate discharge home if normal.  [SU]  1827 Delta troponin is 9. Patient updated and remains asymptomatic. Stable for discharge home.  [SU]    Clinical Course User Index [SU] Odell Balls, PA-C  Medical Decision Making       Final diagnoses:  Dizziness    ED Discharge Orders     None          Odell Balls, DEVONNA 02/14/24 1827    Cottie Donnice PARAS, MD 02/14/24 [phone]

## 2024-03-23 ENCOUNTER — Ambulatory Visit: Attending: Medical

## 2024-03-23 DIAGNOSIS — R0609 Other forms of dyspnea: Secondary | ICD-10-CM

## 2024-03-23 LAB — ECHOCARDIOGRAM COMPLETE
AR max vel: 2.46 cm2
AV Area VTI: 2.37 cm2
AV Area mean vel: 2.44 cm2
AV Mean grad: 6 mmHg
AV Peak grad: 11.7 mmHg
Ao pk vel: 1.71 m/s
Area-P 1/2: 2.66 cm2
S' Lateral: 3 cm

## 2024-03-24 ENCOUNTER — Ambulatory Visit: Payer: Self-pay | Admitting: Medical

## 2024-04-05 ENCOUNTER — Other Ambulatory Visit: Payer: Self-pay

## 2024-04-05 ENCOUNTER — Ambulatory Visit: Admitting: Sleep Medicine

## 2024-04-05 DIAGNOSIS — I4891 Unspecified atrial fibrillation: Secondary | ICD-10-CM

## 2024-04-05 DIAGNOSIS — G4733 Obstructive sleep apnea (adult) (pediatric): Secondary | ICD-10-CM

## 2024-04-14 ENCOUNTER — Other Ambulatory Visit: Payer: Self-pay | Admitting: Family

## 2024-05-06 ENCOUNTER — Ambulatory Visit: Admitting: Sleep Medicine

## 2024-05-11 ENCOUNTER — Ambulatory Visit: Admitting: Medical

## 2024-09-07 ENCOUNTER — Ambulatory Visit

## 2024-09-27 ENCOUNTER — Encounter
